# Patient Record
Sex: Female | Born: 1945
Health system: Southern US, Community
[De-identification: ages and names within clinical notes are randomized; demographics above are authoritative.]

## PROBLEM LIST (undated history)

## (undated) DIAGNOSIS — G8929 Other chronic pain: Secondary | ICD-10-CM

## (undated) DIAGNOSIS — M542 Cervicalgia: Secondary | ICD-10-CM

## (undated) DIAGNOSIS — J4520 Mild intermittent asthma, uncomplicated: Secondary | ICD-10-CM

## (undated) DIAGNOSIS — Z973 Presence of spectacles and contact lenses: Secondary | ICD-10-CM

## (undated) DIAGNOSIS — M5412 Radiculopathy, cervical region: Secondary | ICD-10-CM

## (undated) DIAGNOSIS — M5416 Radiculopathy, lumbar region: Secondary | ICD-10-CM

## (undated) DIAGNOSIS — Z9889 Other specified postprocedural states: Secondary | ICD-10-CM

## (undated) DIAGNOSIS — I10 Essential (primary) hypertension: Secondary | ICD-10-CM

## (undated) DIAGNOSIS — M549 Dorsalgia, unspecified: Secondary | ICD-10-CM

## (undated) DIAGNOSIS — Z8639 Personal history of other endocrine, nutritional and metabolic disease: Secondary | ICD-10-CM

## (undated) DIAGNOSIS — K219 Gastro-esophageal reflux disease without esophagitis: Secondary | ICD-10-CM

## (undated) DIAGNOSIS — E785 Hyperlipidemia, unspecified: Secondary | ICD-10-CM

## (undated) DIAGNOSIS — Z8673 Personal history of transient ischemic attack (TIA), and cerebral infarction without residual deficits: Secondary | ICD-10-CM

## (undated) DIAGNOSIS — G43909 Migraine, unspecified, not intractable, without status migrainosus: Secondary | ICD-10-CM

## (undated) DIAGNOSIS — F329 Major depressive disorder, single episode, unspecified: Secondary | ICD-10-CM

## (undated) DIAGNOSIS — E890 Postprocedural hypothyroidism: Secondary | ICD-10-CM

## (undated) DIAGNOSIS — Z86018 Personal history of other benign neoplasm: Secondary | ICD-10-CM

## (undated) DIAGNOSIS — R112 Nausea with vomiting, unspecified: Secondary | ICD-10-CM

## (undated) DIAGNOSIS — M797 Fibromyalgia: Secondary | ICD-10-CM

## (undated) DIAGNOSIS — N3941 Urge incontinence: Secondary | ICD-10-CM

## (undated) DIAGNOSIS — Z8781 Personal history of (healed) traumatic fracture: Secondary | ICD-10-CM

## (undated) DIAGNOSIS — Z8719 Personal history of other diseases of the digestive system: Secondary | ICD-10-CM

## (undated) DIAGNOSIS — F419 Anxiety disorder, unspecified: Secondary | ICD-10-CM

## (undated) DIAGNOSIS — F32A Depression, unspecified: Secondary | ICD-10-CM

## (undated) DIAGNOSIS — M75101 Unspecified rotator cuff tear or rupture of right shoulder, not specified as traumatic: Secondary | ICD-10-CM

## (undated) HISTORY — DX: Depression, unspecified: F32.A

## (undated) HISTORY — DX: Personal history of (healed) traumatic fracture: Z87.81

## (undated) HISTORY — DX: Major depressive disorder, single episode, unspecified: F32.9

## (undated) HISTORY — DX: Essential (primary) hypertension: I10

## (undated) HISTORY — DX: Fibromyalgia: M79.7

## (undated) HISTORY — PX: OTHER SURGICAL HISTORY: SHX169

## (undated) HISTORY — PX: CARDIAC CATHETERIZATION: SHX172

## (undated) HISTORY — DX: Migraine, unspecified, not intractable, without status migrainosus: G43.909

## (undated) HISTORY — DX: Gastro-esophageal reflux disease without esophagitis: K21.9

## (undated) HISTORY — DX: Anxiety disorder, unspecified: F41.9

---

## 1982-07-11 HISTORY — PX: VAGINAL HYSTERECTOMY: SUR661

## 1983-07-12 HISTORY — PX: TOTAL THYROIDECTOMY: SHX2547

## 1989-03-11 HISTORY — PX: EXCISION OF BREAST BIOPSY: SHX5822

## 1998-04-25 ENCOUNTER — Encounter: Payer: Self-pay | Admitting: Emergency Medicine

## 1998-04-25 ENCOUNTER — Emergency Department (HOSPITAL_COMMUNITY): Admission: EM | Admit: 1998-04-25 | Discharge: 1998-04-25 | Payer: Self-pay | Admitting: Emergency Medicine

## 1998-05-04 ENCOUNTER — Emergency Department (HOSPITAL_COMMUNITY): Admission: EM | Admit: 1998-05-04 | Discharge: 1998-05-04 | Payer: Self-pay | Admitting: Emergency Medicine

## 1998-06-21 ENCOUNTER — Ambulatory Visit (HOSPITAL_COMMUNITY): Admission: RE | Admit: 1998-06-21 | Discharge: 1998-06-21 | Payer: Self-pay | Admitting: Orthopedic Surgery

## 1998-06-21 ENCOUNTER — Encounter: Payer: Self-pay | Admitting: Orthopedic Surgery

## 1998-07-11 HISTORY — PX: WRIST SURGERY: SHX841

## 1998-07-11 HISTORY — PX: LAPAROSCOPIC CHOLECYSTECTOMY: SUR755

## 1998-12-11 ENCOUNTER — Encounter: Payer: Self-pay | Admitting: Specialist

## 1998-12-11 ENCOUNTER — Ambulatory Visit (HOSPITAL_COMMUNITY): Admission: RE | Admit: 1998-12-11 | Discharge: 1998-12-11 | Payer: Self-pay | Admitting: Specialist

## 1999-05-28 ENCOUNTER — Encounter: Payer: Self-pay | Admitting: General Surgery

## 1999-05-31 ENCOUNTER — Encounter (INDEPENDENT_AMBULATORY_CARE_PROVIDER_SITE_OTHER): Payer: Self-pay | Admitting: *Deleted

## 1999-05-31 ENCOUNTER — Ambulatory Visit (HOSPITAL_COMMUNITY): Admission: RE | Admit: 1999-05-31 | Discharge: 1999-06-01 | Payer: Self-pay | Admitting: General Surgery

## 1999-09-20 ENCOUNTER — Encounter: Admission: RE | Admit: 1999-09-20 | Discharge: 1999-10-20 | Payer: Self-pay | Admitting: Orthopedic Surgery

## 1999-10-05 ENCOUNTER — Ambulatory Visit (HOSPITAL_COMMUNITY): Admission: RE | Admit: 1999-10-05 | Discharge: 1999-10-05 | Payer: Self-pay | Admitting: Orthopedic Surgery

## 1999-10-05 ENCOUNTER — Encounter: Payer: Self-pay | Admitting: Orthopedic Surgery

## 1999-10-20 ENCOUNTER — Encounter: Admission: RE | Admit: 1999-10-20 | Discharge: 1999-11-10 | Payer: Self-pay | Admitting: Orthopedic Surgery

## 2000-01-27 ENCOUNTER — Ambulatory Visit (HOSPITAL_COMMUNITY): Admission: RE | Admit: 2000-01-27 | Discharge: 2000-01-27 | Payer: Self-pay | Admitting: *Deleted

## 2000-01-27 ENCOUNTER — Encounter: Payer: Self-pay | Admitting: *Deleted

## 2000-07-11 HISTORY — PX: EXCISION MORTON'S NEUROMA: SHX5013

## 2001-03-20 ENCOUNTER — Encounter: Payer: Self-pay | Admitting: *Deleted

## 2001-03-20 ENCOUNTER — Ambulatory Visit (HOSPITAL_COMMUNITY): Admission: RE | Admit: 2001-03-20 | Discharge: 2001-03-20 | Payer: Self-pay | Admitting: *Deleted

## 2001-11-12 ENCOUNTER — Other Ambulatory Visit: Admission: RE | Admit: 2001-11-12 | Discharge: 2001-11-12 | Payer: Self-pay | Admitting: Dermatology

## 2001-11-15 ENCOUNTER — Emergency Department (HOSPITAL_COMMUNITY): Admission: EM | Admit: 2001-11-15 | Discharge: 2001-11-15 | Payer: Self-pay

## 2001-11-17 ENCOUNTER — Emergency Department (HOSPITAL_COMMUNITY): Admission: EM | Admit: 2001-11-17 | Discharge: 2001-11-18 | Payer: Self-pay | Admitting: Emergency Medicine

## 2002-06-22 ENCOUNTER — Ambulatory Visit (HOSPITAL_COMMUNITY): Admission: RE | Admit: 2002-06-22 | Discharge: 2002-06-22 | Payer: Self-pay | Admitting: Orthopedic Surgery

## 2002-06-23 ENCOUNTER — Encounter: Payer: Self-pay | Admitting: Orthopedic Surgery

## 2003-01-17 ENCOUNTER — Ambulatory Visit (HOSPITAL_COMMUNITY): Admission: RE | Admit: 2003-01-17 | Discharge: 2003-01-17 | Payer: Self-pay | Admitting: Cardiology

## 2003-02-24 ENCOUNTER — Ambulatory Visit (HOSPITAL_BASED_OUTPATIENT_CLINIC_OR_DEPARTMENT_OTHER): Admission: RE | Admit: 2003-02-24 | Discharge: 2003-02-24 | Payer: Self-pay | Admitting: Internal Medicine

## 2003-03-21 ENCOUNTER — Encounter: Admission: RE | Admit: 2003-03-21 | Discharge: 2003-03-21 | Payer: Self-pay | Admitting: Internal Medicine

## 2003-03-25 ENCOUNTER — Ambulatory Visit (HOSPITAL_COMMUNITY): Admission: RE | Admit: 2003-03-25 | Discharge: 2003-03-25 | Payer: Self-pay | Admitting: Internal Medicine

## 2003-04-07 ENCOUNTER — Ambulatory Visit (HOSPITAL_COMMUNITY): Admission: RE | Admit: 2003-04-07 | Discharge: 2003-04-07 | Payer: Self-pay | Admitting: Internal Medicine

## 2003-04-07 ENCOUNTER — Encounter: Admission: RE | Admit: 2003-04-07 | Discharge: 2003-04-07 | Payer: Self-pay | Admitting: Internal Medicine

## 2003-05-15 ENCOUNTER — Encounter: Admission: RE | Admit: 2003-05-15 | Discharge: 2003-05-15 | Payer: Self-pay | Admitting: Internal Medicine

## 2003-05-19 ENCOUNTER — Encounter
Admission: RE | Admit: 2003-05-19 | Discharge: 2003-08-17 | Payer: Self-pay | Admitting: Physical Medicine & Rehabilitation

## 2003-05-20 ENCOUNTER — Encounter: Admission: RE | Admit: 2003-05-20 | Discharge: 2003-05-20 | Payer: Self-pay | Admitting: Internal Medicine

## 2003-05-27 ENCOUNTER — Encounter
Admission: RE | Admit: 2003-05-27 | Discharge: 2003-08-18 | Payer: Self-pay | Admitting: Physical Medicine & Rehabilitation

## 2003-08-13 ENCOUNTER — Ambulatory Visit (HOSPITAL_COMMUNITY): Admission: RE | Admit: 2003-08-13 | Discharge: 2003-08-13 | Payer: Self-pay | Admitting: Internal Medicine

## 2003-08-13 ENCOUNTER — Encounter: Admission: RE | Admit: 2003-08-13 | Discharge: 2003-08-13 | Payer: Self-pay | Admitting: Internal Medicine

## 2003-10-01 ENCOUNTER — Encounter
Admission: RE | Admit: 2003-10-01 | Discharge: 2003-12-30 | Payer: Self-pay | Admitting: Physical Medicine & Rehabilitation

## 2003-10-06 ENCOUNTER — Ambulatory Visit (HOSPITAL_COMMUNITY): Admission: RE | Admit: 2003-10-06 | Discharge: 2003-10-06 | Payer: Self-pay | Admitting: Internal Medicine

## 2003-10-13 ENCOUNTER — Encounter: Admission: RE | Admit: 2003-10-13 | Discharge: 2003-10-13 | Payer: Self-pay | Admitting: Internal Medicine

## 2003-12-24 ENCOUNTER — Encounter: Admission: RE | Admit: 2003-12-24 | Discharge: 2003-12-24 | Payer: Self-pay | Admitting: Internal Medicine

## 2003-12-24 ENCOUNTER — Encounter
Admission: RE | Admit: 2003-12-24 | Discharge: 2004-02-04 | Payer: Self-pay | Admitting: Physical Medicine & Rehabilitation

## 2004-01-30 ENCOUNTER — Encounter
Admission: RE | Admit: 2004-01-30 | Discharge: 2004-04-05 | Payer: Self-pay | Admitting: Physical Medicine & Rehabilitation

## 2004-02-04 ENCOUNTER — Ambulatory Visit (HOSPITAL_COMMUNITY)
Admission: RE | Admit: 2004-02-04 | Discharge: 2004-02-04 | Payer: Self-pay | Admitting: Physical Medicine & Rehabilitation

## 2004-02-19 ENCOUNTER — Emergency Department (HOSPITAL_COMMUNITY): Admission: EM | Admit: 2004-02-19 | Discharge: 2004-02-19 | Payer: Self-pay | Admitting: Emergency Medicine

## 2004-03-15 ENCOUNTER — Emergency Department (HOSPITAL_COMMUNITY): Admission: EM | Admit: 2004-03-15 | Discharge: 2004-03-15 | Payer: Self-pay | Admitting: Emergency Medicine

## 2004-03-19 ENCOUNTER — Ambulatory Visit: Payer: Self-pay | Admitting: Internal Medicine

## 2004-03-31 ENCOUNTER — Ambulatory Visit: Payer: Self-pay | Admitting: Internal Medicine

## 2004-04-05 ENCOUNTER — Encounter
Admission: RE | Admit: 2004-04-05 | Discharge: 2004-07-04 | Payer: Self-pay | Admitting: Physical Medicine & Rehabilitation

## 2004-04-06 ENCOUNTER — Ambulatory Visit: Payer: Self-pay | Admitting: Physical Medicine & Rehabilitation

## 2004-04-10 ENCOUNTER — Ambulatory Visit (HOSPITAL_COMMUNITY)
Admission: RE | Admit: 2004-04-10 | Discharge: 2004-04-10 | Payer: Self-pay | Admitting: Physical Medicine & Rehabilitation

## 2004-04-16 ENCOUNTER — Encounter
Admission: RE | Admit: 2004-04-16 | Discharge: 2004-07-15 | Payer: Self-pay | Admitting: Physical Medicine & Rehabilitation

## 2004-04-16 ENCOUNTER — Ambulatory Visit: Payer: Self-pay | Admitting: Psychology

## 2004-05-22 ENCOUNTER — Emergency Department (HOSPITAL_COMMUNITY): Admission: EM | Admit: 2004-05-22 | Discharge: 2004-05-22 | Payer: Self-pay | Admitting: Emergency Medicine

## 2004-06-15 ENCOUNTER — Ambulatory Visit: Payer: Self-pay | Admitting: Physical Medicine & Rehabilitation

## 2004-06-30 ENCOUNTER — Ambulatory Visit: Payer: Self-pay | Admitting: Internal Medicine

## 2004-07-29 ENCOUNTER — Ambulatory Visit: Payer: Self-pay | Admitting: Psychology

## 2004-07-29 ENCOUNTER — Encounter
Admission: RE | Admit: 2004-07-29 | Discharge: 2004-10-27 | Payer: Self-pay | Admitting: Physical Medicine & Rehabilitation

## 2004-07-30 ENCOUNTER — Ambulatory Visit (HOSPITAL_COMMUNITY): Admission: RE | Admit: 2004-07-30 | Discharge: 2004-07-30 | Payer: Self-pay | Admitting: Internal Medicine

## 2004-07-30 ENCOUNTER — Ambulatory Visit: Payer: Self-pay | Admitting: Internal Medicine

## 2004-08-10 ENCOUNTER — Ambulatory Visit (HOSPITAL_COMMUNITY): Admission: RE | Admit: 2004-08-10 | Discharge: 2004-08-10 | Payer: Self-pay | Admitting: Internal Medicine

## 2004-09-09 ENCOUNTER — Encounter
Admission: RE | Admit: 2004-09-09 | Discharge: 2004-12-08 | Payer: Self-pay | Admitting: Physical Medicine & Rehabilitation

## 2004-09-13 ENCOUNTER — Ambulatory Visit: Payer: Self-pay | Admitting: Physical Medicine & Rehabilitation

## 2004-09-18 ENCOUNTER — Emergency Department (HOSPITAL_COMMUNITY): Admission: EM | Admit: 2004-09-18 | Discharge: 2004-09-18 | Payer: Self-pay | Admitting: Emergency Medicine

## 2004-09-22 ENCOUNTER — Ambulatory Visit: Payer: Self-pay | Admitting: Internal Medicine

## 2004-10-06 ENCOUNTER — Ambulatory Visit: Payer: Self-pay | Admitting: Psychology

## 2004-10-06 ENCOUNTER — Ambulatory Visit: Payer: Self-pay | Admitting: Internal Medicine

## 2004-10-15 ENCOUNTER — Ambulatory Visit (HOSPITAL_COMMUNITY): Admission: RE | Admit: 2004-10-15 | Discharge: 2004-10-15 | Payer: Self-pay | Admitting: Internal Medicine

## 2004-10-15 ENCOUNTER — Ambulatory Visit: Payer: Self-pay | Admitting: Internal Medicine

## 2004-11-10 ENCOUNTER — Ambulatory Visit: Payer: Self-pay | Admitting: Internal Medicine

## 2004-12-01 ENCOUNTER — Ambulatory Visit: Payer: Self-pay | Admitting: Physical Medicine & Rehabilitation

## 2004-12-01 ENCOUNTER — Ambulatory Visit (HOSPITAL_COMMUNITY)
Admission: RE | Admit: 2004-12-01 | Discharge: 2004-12-01 | Payer: Self-pay | Admitting: Physical Medicine & Rehabilitation

## 2004-12-02 ENCOUNTER — Ambulatory Visit: Payer: Self-pay | Admitting: Psychology

## 2004-12-02 ENCOUNTER — Encounter: Admission: RE | Admit: 2004-12-02 | Discharge: 2005-03-02 | Payer: Self-pay | Admitting: Psychology

## 2004-12-08 ENCOUNTER — Ambulatory Visit: Payer: Self-pay | Admitting: Internal Medicine

## 2004-12-15 ENCOUNTER — Ambulatory Visit: Payer: Self-pay | Admitting: Internal Medicine

## 2004-12-30 ENCOUNTER — Ambulatory Visit: Payer: Self-pay | Admitting: Psychology

## 2005-01-05 ENCOUNTER — Emergency Department (HOSPITAL_COMMUNITY): Admission: EM | Admit: 2005-01-05 | Discharge: 2005-01-06 | Payer: Self-pay | Admitting: Emergency Medicine

## 2005-01-18 ENCOUNTER — Ambulatory Visit: Payer: Self-pay | Admitting: Physical Medicine & Rehabilitation

## 2005-01-18 ENCOUNTER — Encounter
Admission: RE | Admit: 2005-01-18 | Discharge: 2005-04-18 | Payer: Self-pay | Admitting: Physical Medicine & Rehabilitation

## 2005-01-26 ENCOUNTER — Ambulatory Visit: Payer: Self-pay | Admitting: Internal Medicine

## 2005-01-27 ENCOUNTER — Encounter
Admission: RE | Admit: 2005-01-27 | Discharge: 2005-02-10 | Payer: Self-pay | Admitting: Physical Medicine & Rehabilitation

## 2005-01-27 ENCOUNTER — Ambulatory Visit (HOSPITAL_COMMUNITY): Admission: RE | Admit: 2005-01-27 | Discharge: 2005-01-27 | Payer: Self-pay | Admitting: Internal Medicine

## 2005-01-29 ENCOUNTER — Emergency Department (HOSPITAL_COMMUNITY): Admission: EM | Admit: 2005-01-29 | Discharge: 2005-01-29 | Payer: Self-pay | Admitting: Emergency Medicine

## 2005-02-04 ENCOUNTER — Ambulatory Visit: Payer: Self-pay | Admitting: Internal Medicine

## 2005-02-09 ENCOUNTER — Ambulatory Visit: Payer: Self-pay | Admitting: Internal Medicine

## 2005-02-14 ENCOUNTER — Ambulatory Visit (HOSPITAL_COMMUNITY): Admission: RE | Admit: 2005-02-14 | Discharge: 2005-02-14 | Payer: Self-pay | Admitting: Internal Medicine

## 2005-02-14 ENCOUNTER — Ambulatory Visit: Payer: Self-pay | Admitting: Internal Medicine

## 2005-02-14 DIAGNOSIS — K449 Diaphragmatic hernia without obstruction or gangrene: Secondary | ICD-10-CM | POA: Insufficient documentation

## 2005-02-17 ENCOUNTER — Ambulatory Visit (HOSPITAL_COMMUNITY): Admission: RE | Admit: 2005-02-17 | Discharge: 2005-02-17 | Payer: Self-pay | Admitting: Internal Medicine

## 2005-02-17 ENCOUNTER — Encounter: Payer: Self-pay | Admitting: Internal Medicine

## 2005-02-21 ENCOUNTER — Ambulatory Visit: Payer: Self-pay | Admitting: Physical Medicine & Rehabilitation

## 2005-02-21 ENCOUNTER — Ambulatory Visit: Payer: Self-pay | Admitting: Internal Medicine

## 2005-04-07 ENCOUNTER — Encounter
Admission: RE | Admit: 2005-04-07 | Discharge: 2005-05-02 | Payer: Self-pay | Admitting: Physical Medicine & Rehabilitation

## 2005-04-07 ENCOUNTER — Ambulatory Visit: Payer: Self-pay | Admitting: Psychology

## 2005-04-18 ENCOUNTER — Ambulatory Visit: Payer: Self-pay | Admitting: Physical Medicine & Rehabilitation

## 2005-04-18 ENCOUNTER — Encounter
Admission: RE | Admit: 2005-04-18 | Discharge: 2005-07-17 | Payer: Self-pay | Admitting: Physical Medicine & Rehabilitation

## 2005-04-20 ENCOUNTER — Ambulatory Visit: Payer: Self-pay | Admitting: Internal Medicine

## 2005-05-03 ENCOUNTER — Encounter
Admission: RE | Admit: 2005-05-03 | Discharge: 2005-05-30 | Payer: Self-pay | Admitting: Physical Medicine & Rehabilitation

## 2005-05-31 ENCOUNTER — Encounter
Admission: RE | Admit: 2005-05-31 | Discharge: 2005-08-29 | Payer: Self-pay | Admitting: Physical Medicine & Rehabilitation

## 2005-06-22 ENCOUNTER — Ambulatory Visit: Payer: Self-pay | Admitting: Internal Medicine

## 2005-07-13 ENCOUNTER — Emergency Department (HOSPITAL_COMMUNITY): Admission: EM | Admit: 2005-07-13 | Discharge: 2005-07-13 | Payer: Self-pay | Admitting: Emergency Medicine

## 2005-07-21 ENCOUNTER — Ambulatory Visit: Payer: Self-pay | Admitting: Physical Medicine & Rehabilitation

## 2005-07-21 ENCOUNTER — Encounter
Admission: RE | Admit: 2005-07-21 | Discharge: 2005-10-19 | Payer: Self-pay | Admitting: Physical Medicine & Rehabilitation

## 2005-08-08 ENCOUNTER — Encounter (HOSPITAL_COMMUNITY): Admission: RE | Admit: 2005-08-08 | Discharge: 2005-09-07 | Payer: Self-pay | Admitting: Orthopaedic Surgery

## 2005-08-12 ENCOUNTER — Ambulatory Visit (HOSPITAL_COMMUNITY): Admission: RE | Admit: 2005-08-12 | Discharge: 2005-08-12 | Payer: Self-pay | Admitting: Internal Medicine

## 2005-08-23 ENCOUNTER — Ambulatory Visit: Payer: Self-pay | Admitting: Internal Medicine

## 2005-08-30 ENCOUNTER — Ambulatory Visit: Payer: Self-pay | Admitting: Internal Medicine

## 2005-09-07 ENCOUNTER — Ambulatory Visit: Payer: Self-pay | Admitting: Internal Medicine

## 2005-09-08 ENCOUNTER — Ambulatory Visit (HOSPITAL_COMMUNITY): Admission: RE | Admit: 2005-09-08 | Discharge: 2005-09-08 | Payer: Self-pay | Admitting: Internal Medicine

## 2005-09-13 ENCOUNTER — Ambulatory Visit (HOSPITAL_COMMUNITY): Admission: RE | Admit: 2005-09-13 | Discharge: 2005-09-13 | Payer: Self-pay | Admitting: Internal Medicine

## 2005-09-15 ENCOUNTER — Ambulatory Visit: Payer: Self-pay | Admitting: Physical Medicine & Rehabilitation

## 2005-10-12 ENCOUNTER — Ambulatory Visit: Payer: Self-pay | Admitting: Internal Medicine

## 2005-11-01 ENCOUNTER — Ambulatory Visit: Payer: Self-pay | Admitting: Internal Medicine

## 2005-11-02 ENCOUNTER — Encounter
Admission: RE | Admit: 2005-11-02 | Discharge: 2006-01-31 | Payer: Self-pay | Admitting: Physical Medicine & Rehabilitation

## 2005-11-08 ENCOUNTER — Ambulatory Visit: Payer: Self-pay | Admitting: Internal Medicine

## 2005-12-08 ENCOUNTER — Ambulatory Visit: Payer: Self-pay | Admitting: Physical Medicine & Rehabilitation

## 2005-12-14 ENCOUNTER — Ambulatory Visit: Payer: Self-pay | Admitting: Internal Medicine

## 2005-12-28 ENCOUNTER — Ambulatory Visit: Payer: Self-pay | Admitting: Internal Medicine

## 2005-12-29 ENCOUNTER — Encounter: Admission: RE | Admit: 2005-12-29 | Discharge: 2006-03-29 | Payer: Self-pay | Admitting: Psychology

## 2005-12-29 ENCOUNTER — Ambulatory Visit: Payer: Self-pay | Admitting: Psychology

## 2006-01-10 ENCOUNTER — Ambulatory Visit: Payer: Self-pay | Admitting: Hospitalist

## 2006-01-10 ENCOUNTER — Encounter: Payer: Self-pay | Admitting: Vascular Surgery

## 2006-01-10 ENCOUNTER — Ambulatory Visit (HOSPITAL_COMMUNITY): Admission: RE | Admit: 2006-01-10 | Discharge: 2006-01-10 | Payer: Self-pay | Admitting: Hospitalist

## 2006-01-17 ENCOUNTER — Ambulatory Visit: Payer: Self-pay | Admitting: Hospitalist

## 2006-01-26 ENCOUNTER — Emergency Department (HOSPITAL_COMMUNITY): Admission: EM | Admit: 2006-01-26 | Discharge: 2006-01-26 | Payer: Self-pay | Admitting: Emergency Medicine

## 2006-02-16 ENCOUNTER — Ambulatory Visit (HOSPITAL_COMMUNITY): Admission: RE | Admit: 2006-02-16 | Discharge: 2006-02-16 | Payer: Self-pay | Admitting: Orthopaedic Surgery

## 2006-02-23 ENCOUNTER — Ambulatory Visit: Payer: Self-pay | Admitting: Physical Medicine & Rehabilitation

## 2006-02-23 ENCOUNTER — Ambulatory Visit: Payer: Self-pay | Admitting: Internal Medicine

## 2006-02-23 ENCOUNTER — Encounter
Admission: RE | Admit: 2006-02-23 | Discharge: 2006-05-24 | Payer: Self-pay | Admitting: Physical Medicine & Rehabilitation

## 2006-02-24 ENCOUNTER — Ambulatory Visit: Payer: Self-pay | Admitting: Psychology

## 2006-03-06 ENCOUNTER — Ambulatory Visit: Payer: Self-pay | Admitting: Cardiology

## 2006-03-08 ENCOUNTER — Ambulatory Visit: Payer: Self-pay | Admitting: Internal Medicine

## 2006-03-09 ENCOUNTER — Encounter: Payer: Self-pay | Admitting: Internal Medicine

## 2006-03-28 ENCOUNTER — Ambulatory Visit: Payer: Self-pay

## 2006-04-10 ENCOUNTER — Encounter: Payer: Self-pay | Admitting: Internal Medicine

## 2006-04-10 ENCOUNTER — Ambulatory Visit: Payer: Self-pay | Admitting: Cardiology

## 2006-04-11 DIAGNOSIS — K219 Gastro-esophageal reflux disease without esophagitis: Secondary | ICD-10-CM | POA: Insufficient documentation

## 2006-04-11 DIAGNOSIS — E039 Hypothyroidism, unspecified: Secondary | ICD-10-CM | POA: Insufficient documentation

## 2006-04-11 DIAGNOSIS — I10 Essential (primary) hypertension: Secondary | ICD-10-CM | POA: Insufficient documentation

## 2006-04-20 ENCOUNTER — Ambulatory Visit: Payer: Self-pay | Admitting: Internal Medicine

## 2006-04-28 ENCOUNTER — Ambulatory Visit: Payer: Self-pay | Admitting: Psychology

## 2006-04-28 ENCOUNTER — Encounter: Admission: RE | Admit: 2006-04-28 | Discharge: 2006-07-27 | Payer: Self-pay | Admitting: Psychology

## 2006-05-25 ENCOUNTER — Ambulatory Visit: Payer: Self-pay | Admitting: Physical Medicine & Rehabilitation

## 2006-05-25 ENCOUNTER — Encounter
Admission: RE | Admit: 2006-05-25 | Discharge: 2006-08-23 | Payer: Self-pay | Admitting: Physical Medicine & Rehabilitation

## 2006-06-06 ENCOUNTER — Encounter (INDEPENDENT_AMBULATORY_CARE_PROVIDER_SITE_OTHER): Payer: Self-pay | Admitting: *Deleted

## 2006-06-06 ENCOUNTER — Ambulatory Visit: Payer: Self-pay | Admitting: Internal Medicine

## 2006-06-06 ENCOUNTER — Ambulatory Visit (HOSPITAL_COMMUNITY): Admission: RE | Admit: 2006-06-06 | Discharge: 2006-06-06 | Payer: Self-pay | Admitting: Internal Medicine

## 2006-06-06 LAB — CONVERTED CEMR LAB
ALT: 42 units/L — ABNORMAL HIGH (ref 0–35)
Alkaline Phosphatase: 95 units/L (ref 39–117)
BUN: 5 mg/dL — ABNORMAL LOW (ref 6–23)
CO2: 26 meq/L (ref 19–32)
Chloride: 103 meq/L (ref 96–112)
Glucose, Bld: 92 mg/dL (ref 70–99)
Hemoglobin: 13.7 g/dL (ref 11.7–14.8)
Ketones, ur: NEGATIVE mg/dL
MCHC: 33.9 g/dL (ref 33.1–35.4)
Protein, ur: NEGATIVE mg/dL
Specific Gravity, Urine: 1.01 (ref 1.005–1.03)
Urine Glucose: NEGATIVE mg/dL
pH: 6.5 (ref 5.0–8.0)

## 2006-06-07 ENCOUNTER — Encounter (INDEPENDENT_AMBULATORY_CARE_PROVIDER_SITE_OTHER): Payer: Self-pay | Admitting: *Deleted

## 2006-06-08 ENCOUNTER — Ambulatory Visit: Payer: Self-pay | Admitting: Internal Medicine

## 2006-06-08 ENCOUNTER — Encounter (INDEPENDENT_AMBULATORY_CARE_PROVIDER_SITE_OTHER): Payer: Self-pay | Admitting: *Deleted

## 2006-06-08 LAB — CONVERTED CEMR LAB
CO2: 27 meq/L (ref 19–32)
Glucose, Bld: 97 mg/dL (ref 70–99)
Sodium: 138 meq/L (ref 135–145)

## 2006-06-21 ENCOUNTER — Ambulatory Visit: Payer: Self-pay | Admitting: Internal Medicine

## 2006-06-21 LAB — CONVERTED CEMR LAB
ALT: 44 units/L — ABNORMAL HIGH (ref 0–35)
Calcium: 9.2 mg/dL (ref 8.4–10.5)
Creatinine, Ser: 0.68 mg/dL (ref 0.40–1.20)
Potassium: 4.3 meq/L (ref 3.5–5.3)
Sodium: 141 meq/L (ref 135–145)
Total Bilirubin: 0.3 mg/dL (ref 0.3–1.2)

## 2006-09-04 ENCOUNTER — Telehealth: Payer: Self-pay | Admitting: *Deleted

## 2006-09-20 ENCOUNTER — Telehealth (INDEPENDENT_AMBULATORY_CARE_PROVIDER_SITE_OTHER): Payer: Self-pay | Admitting: Internal Medicine

## 2006-09-21 ENCOUNTER — Telehealth: Payer: Self-pay | Admitting: *Deleted

## 2006-09-27 ENCOUNTER — Encounter
Admission: RE | Admit: 2006-09-27 | Discharge: 2006-09-27 | Payer: Self-pay | Admitting: Physical Medicine and Rehabilitation

## 2006-10-25 ENCOUNTER — Ambulatory Visit: Payer: Self-pay | Admitting: Internal Medicine

## 2006-10-25 DIAGNOSIS — G43009 Migraine without aura, not intractable, without status migrainosus: Secondary | ICD-10-CM | POA: Insufficient documentation

## 2006-10-25 DIAGNOSIS — M797 Fibromyalgia: Secondary | ICD-10-CM | POA: Insufficient documentation

## 2006-10-25 DIAGNOSIS — J309 Allergic rhinitis, unspecified: Secondary | ICD-10-CM | POA: Insufficient documentation

## 2006-10-25 DIAGNOSIS — M546 Pain in thoracic spine: Secondary | ICD-10-CM | POA: Insufficient documentation

## 2006-10-25 DIAGNOSIS — M25519 Pain in unspecified shoulder: Secondary | ICD-10-CM | POA: Insufficient documentation

## 2006-10-25 LAB — CONVERTED CEMR LAB
Alkaline Phosphatase: 139 units/L — ABNORMAL HIGH (ref 39–117)
BUN: 10 mg/dL (ref 6–23)
CO2: 24 meq/L (ref 19–32)
Calcium: 9.4 mg/dL (ref 8.4–10.5)
Chloride: 103 meq/L (ref 96–112)
Creatinine, Ser: 0.69 mg/dL (ref 0.40–1.20)
Sodium: 140 meq/L (ref 135–145)
TSH: 0.449 microintl units/mL (ref 0.350–5.50)

## 2006-11-07 ENCOUNTER — Ambulatory Visit (HOSPITAL_COMMUNITY): Admission: RE | Admit: 2006-11-07 | Discharge: 2006-11-07 | Payer: Self-pay | Admitting: Internal Medicine

## 2006-11-08 ENCOUNTER — Encounter
Admission: RE | Admit: 2006-11-08 | Discharge: 2006-11-08 | Payer: Self-pay | Admitting: Physical Medicine and Rehabilitation

## 2006-11-10 ENCOUNTER — Telehealth (INDEPENDENT_AMBULATORY_CARE_PROVIDER_SITE_OTHER): Payer: Self-pay | Admitting: *Deleted

## 2006-11-16 ENCOUNTER — Encounter
Admission: RE | Admit: 2006-11-16 | Discharge: 2007-02-14 | Payer: Self-pay | Admitting: Physical Medicine & Rehabilitation

## 2006-11-16 ENCOUNTER — Ambulatory Visit: Payer: Self-pay | Admitting: Physical Medicine & Rehabilitation

## 2006-12-08 ENCOUNTER — Encounter
Admission: RE | Admit: 2006-12-08 | Discharge: 2007-03-08 | Payer: Self-pay | Admitting: Physical Medicine & Rehabilitation

## 2006-12-11 ENCOUNTER — Ambulatory Visit: Payer: Self-pay | Admitting: Physical Medicine & Rehabilitation

## 2006-12-20 ENCOUNTER — Telehealth (INDEPENDENT_AMBULATORY_CARE_PROVIDER_SITE_OTHER): Payer: Self-pay | Admitting: Pharmacy Technician

## 2006-12-27 ENCOUNTER — Ambulatory Visit: Payer: Self-pay | Admitting: Physical Medicine & Rehabilitation

## 2007-01-01 ENCOUNTER — Telehealth (INDEPENDENT_AMBULATORY_CARE_PROVIDER_SITE_OTHER): Payer: Self-pay | Admitting: Pharmacy Technician

## 2007-01-17 ENCOUNTER — Ambulatory Visit: Payer: Self-pay | Admitting: Internal Medicine

## 2007-01-17 DIAGNOSIS — R748 Abnormal levels of other serum enzymes: Secondary | ICD-10-CM | POA: Insufficient documentation

## 2007-01-23 ENCOUNTER — Ambulatory Visit (HOSPITAL_COMMUNITY): Admission: RE | Admit: 2007-01-23 | Discharge: 2007-01-23 | Payer: Self-pay | Admitting: Orthopaedic Surgery

## 2007-02-06 ENCOUNTER — Ambulatory Visit: Payer: Self-pay | Admitting: Physical Medicine & Rehabilitation

## 2007-02-14 ENCOUNTER — Encounter
Admission: RE | Admit: 2007-02-14 | Discharge: 2007-03-29 | Payer: Self-pay | Admitting: Physical Medicine & Rehabilitation

## 2007-02-21 ENCOUNTER — Telehealth: Payer: Self-pay | Admitting: *Deleted

## 2007-02-26 ENCOUNTER — Ambulatory Visit: Payer: Self-pay | Admitting: Physical Medicine & Rehabilitation

## 2007-03-19 ENCOUNTER — Ambulatory Visit: Payer: Self-pay | Admitting: Cardiology

## 2007-03-20 ENCOUNTER — Telehealth (INDEPENDENT_AMBULATORY_CARE_PROVIDER_SITE_OTHER): Payer: Self-pay | Admitting: Pharmacy Technician

## 2007-04-12 ENCOUNTER — Telehealth (INDEPENDENT_AMBULATORY_CARE_PROVIDER_SITE_OTHER): Payer: Self-pay | Admitting: Pharmacy Technician

## 2007-04-19 ENCOUNTER — Ambulatory Visit: Payer: Self-pay | Admitting: Internal Medicine

## 2007-04-19 LAB — CONVERTED CEMR LAB
ALT: 32 units/L (ref 0–35)
Albumin: 4.5 g/dL (ref 3.5–5.2)
BUN: 12 mg/dL (ref 6–23)
Basophils Relative: 0 % (ref 0–1)
Bilirubin, Direct: <0.1 mg/dL (ref 0.0–0.3)
CO2: 25 meq/L (ref 19–32)
Calcium: 9.1 mg/dL (ref 8.4–10.5)
Eosinophils Absolute: 0 10*3/uL (ref 0.0–0.7)
Lymphocytes Relative: 22 % (ref 12–46)
MCHC: 31.7 g/dL (ref 30.0–36.0)
MCV: 93.9 fL (ref 78.0–100.0)
Monocytes Absolute: 0.6 10*3/uL (ref 0.2–0.7)
Neutro Abs: 5 10*3/uL (ref 1.7–7.7)
Platelets: 358 10*3/uL (ref 150–400)
Potassium: 4.5 meq/L (ref 3.5–5.3)
Sodium: 141 meq/L (ref 135–145)
Total Bilirubin: 0.2 mg/dL — ABNORMAL LOW (ref 0.3–1.2)
Total Protein: 7.1 g/dL (ref 6.0–8.3)

## 2007-04-25 ENCOUNTER — Encounter
Admission: RE | Admit: 2007-04-25 | Discharge: 2007-05-04 | Payer: Self-pay | Admitting: Physical Medicine & Rehabilitation

## 2007-04-25 ENCOUNTER — Ambulatory Visit: Payer: Self-pay | Admitting: Physical Medicine & Rehabilitation

## 2007-04-27 ENCOUNTER — Telehealth (INDEPENDENT_AMBULATORY_CARE_PROVIDER_SITE_OTHER): Payer: Self-pay | Admitting: Pharmacy Technician

## 2007-05-03 ENCOUNTER — Ambulatory Visit: Payer: Self-pay | Admitting: Physical Medicine & Rehabilitation

## 2007-05-22 ENCOUNTER — Encounter: Payer: Self-pay | Admitting: Internal Medicine

## 2007-05-22 ENCOUNTER — Ambulatory Visit: Payer: Self-pay | Admitting: Internal Medicine

## 2007-05-24 ENCOUNTER — Ambulatory Visit (HOSPITAL_COMMUNITY): Admission: RE | Admit: 2007-05-24 | Discharge: 2007-05-24 | Payer: Self-pay | Admitting: Internal Medicine

## 2007-06-10 ENCOUNTER — Ambulatory Visit (HOSPITAL_COMMUNITY): Admission: RE | Admit: 2007-06-10 | Discharge: 2007-06-10 | Payer: Self-pay | Admitting: Neurology

## 2007-06-14 ENCOUNTER — Ambulatory Visit (HOSPITAL_COMMUNITY): Admission: RE | Admit: 2007-06-14 | Discharge: 2007-06-14 | Payer: Self-pay | Admitting: Neurology

## 2007-06-25 ENCOUNTER — Telehealth: Payer: Self-pay | Admitting: Internal Medicine

## 2007-07-02 ENCOUNTER — Telehealth (INDEPENDENT_AMBULATORY_CARE_PROVIDER_SITE_OTHER): Payer: Self-pay | Admitting: Pharmacy Technician

## 2007-07-10 ENCOUNTER — Ambulatory Visit: Payer: Self-pay | Admitting: Hospitalist

## 2007-07-10 ENCOUNTER — Encounter (INDEPENDENT_AMBULATORY_CARE_PROVIDER_SITE_OTHER): Payer: Self-pay | Admitting: *Deleted

## 2007-07-10 DIAGNOSIS — R350 Frequency of micturition: Secondary | ICD-10-CM | POA: Insufficient documentation

## 2007-07-10 LAB — CONVERTED CEMR LAB
Glucose, Urine, Semiquant: NEGATIVE
Ketones, urine, test strip: NEGATIVE
Urobilinogen, UA: 0.2

## 2007-07-31 ENCOUNTER — Ambulatory Visit: Payer: Self-pay | Admitting: Physical Medicine & Rehabilitation

## 2007-07-31 ENCOUNTER — Encounter
Admission: RE | Admit: 2007-07-31 | Discharge: 2007-10-29 | Payer: Self-pay | Admitting: Physical Medicine & Rehabilitation

## 2007-08-10 ENCOUNTER — Ambulatory Visit: Payer: Self-pay | Admitting: Internal Medicine

## 2007-08-10 LAB — CONVERTED CEMR LAB: GGT: 60 units/L — ABNORMAL HIGH (ref 7–51)

## 2007-08-11 ENCOUNTER — Encounter: Payer: Self-pay | Admitting: Internal Medicine

## 2007-08-13 ENCOUNTER — Telehealth: Payer: Self-pay | Admitting: *Deleted

## 2007-09-05 ENCOUNTER — Telehealth (INDEPENDENT_AMBULATORY_CARE_PROVIDER_SITE_OTHER): Payer: Self-pay | Admitting: Pharmacy Technician

## 2007-09-14 LAB — CONVERTED CEMR LAB
ALT: 28 units/L (ref 0–35)
AST: 21 units/L (ref 0–37)
Albumin: 4.3 g/dL (ref 3.5–5.2)
Alkaline Phosphatase: 147 units/L — ABNORMAL HIGH (ref 39–117)
Bilirubin Urine: NEGATIVE
Bilirubin, Direct: <0.1 mg/dL (ref 0.0–0.3)
Ketones, ur: NEGATIVE mg/dL
Protein, ur: NEGATIVE mg/dL
Total Protein: 7.2 g/dL (ref 6.0–8.3)
Urine Glucose: NEGATIVE mg/dL

## 2007-09-18 ENCOUNTER — Telehealth (INDEPENDENT_AMBULATORY_CARE_PROVIDER_SITE_OTHER): Payer: Self-pay | Admitting: Pharmacy Technician

## 2007-09-18 ENCOUNTER — Encounter (INDEPENDENT_AMBULATORY_CARE_PROVIDER_SITE_OTHER): Payer: Self-pay | Admitting: Internal Medicine

## 2007-09-18 ENCOUNTER — Telehealth: Payer: Self-pay | Admitting: *Deleted

## 2007-09-18 ENCOUNTER — Ambulatory Visit: Payer: Self-pay | Admitting: Internal Medicine

## 2007-09-18 ENCOUNTER — Telehealth: Payer: Self-pay | Admitting: Internal Medicine

## 2007-09-18 DIAGNOSIS — R079 Chest pain, unspecified: Secondary | ICD-10-CM | POA: Insufficient documentation

## 2007-09-18 LAB — CONVERTED CEMR LAB
Glucose, Urine, Semiquant: NEGATIVE
Nitrite: NEGATIVE
Protein, U semiquant: NEGATIVE
Specific Gravity, Urine: 1.01

## 2007-09-19 ENCOUNTER — Ambulatory Visit (HOSPITAL_COMMUNITY): Admission: RE | Admit: 2007-09-19 | Discharge: 2007-09-19 | Payer: Self-pay | Admitting: Internal Medicine

## 2007-09-19 ENCOUNTER — Encounter (INDEPENDENT_AMBULATORY_CARE_PROVIDER_SITE_OTHER): Payer: Self-pay | Admitting: Internal Medicine

## 2007-09-21 ENCOUNTER — Telehealth (INDEPENDENT_AMBULATORY_CARE_PROVIDER_SITE_OTHER): Payer: Self-pay | Admitting: Pharmacy Technician

## 2007-09-25 ENCOUNTER — Ambulatory Visit (HOSPITAL_COMMUNITY): Admission: RE | Admit: 2007-09-25 | Discharge: 2007-09-25 | Payer: Self-pay | Admitting: Internal Medicine

## 2007-09-28 ENCOUNTER — Telehealth (INDEPENDENT_AMBULATORY_CARE_PROVIDER_SITE_OTHER): Payer: Self-pay | Admitting: Internal Medicine

## 2007-10-23 ENCOUNTER — Ambulatory Visit (HOSPITAL_COMMUNITY): Admission: RE | Admit: 2007-10-23 | Discharge: 2007-10-23 | Payer: Self-pay | Admitting: Orthopaedic Surgery

## 2007-10-31 ENCOUNTER — Ambulatory Visit: Payer: Self-pay | Admitting: Internal Medicine

## 2007-11-08 ENCOUNTER — Ambulatory Visit (HOSPITAL_COMMUNITY): Admission: RE | Admit: 2007-11-08 | Discharge: 2007-11-08 | Payer: Self-pay | Admitting: Internal Medicine

## 2007-11-12 ENCOUNTER — Ambulatory Visit: Payer: Self-pay | Admitting: Surgery

## 2007-11-12 ENCOUNTER — Telehealth: Payer: Self-pay | Admitting: *Deleted

## 2007-11-12 ENCOUNTER — Emergency Department (HOSPITAL_COMMUNITY): Admission: EM | Admit: 2007-11-12 | Discharge: 2007-11-12 | Payer: Self-pay | Admitting: Emergency Medicine

## 2007-11-12 ENCOUNTER — Encounter (INDEPENDENT_AMBULATORY_CARE_PROVIDER_SITE_OTHER): Payer: Self-pay | Admitting: Emergency Medicine

## 2007-11-14 ENCOUNTER — Ambulatory Visit: Payer: Self-pay | Admitting: Internal Medicine

## 2007-11-14 DIAGNOSIS — E7800 Pure hypercholesterolemia, unspecified: Secondary | ICD-10-CM | POA: Insufficient documentation

## 2007-11-14 LAB — CONVERTED CEMR LAB
Albumin: 4.1 g/dL (ref 3.5–5.2)
Alkaline Phosphatase: 124 units/L — ABNORMAL HIGH (ref 39–117)
BUN: 9 mg/dL (ref 6–23)
CO2: 28 meq/L (ref 19–32)
Cholesterol: 254 mg/dL — ABNORMAL HIGH (ref 0–200)
Glucose, Bld: 108 mg/dL — ABNORMAL HIGH (ref 70–99)
HDL: 67 mg/dL (ref >39)
Potassium: 4.5 meq/L (ref 3.5–5.3)
Sodium: 143 meq/L (ref 135–145)
Total Bilirubin: 0.3 mg/dL (ref 0.3–1.2)
Total Protein: 6.6 g/dL (ref 6.0–8.3)
Triglycerides: 163 mg/dL — ABNORMAL HIGH (ref <150)

## 2007-11-20 ENCOUNTER — Encounter
Admission: RE | Admit: 2007-11-20 | Discharge: 2007-11-23 | Payer: Self-pay | Admitting: Physical Medicine & Rehabilitation

## 2007-11-23 ENCOUNTER — Ambulatory Visit: Payer: Self-pay | Admitting: Physical Medicine & Rehabilitation

## 2008-01-07 ENCOUNTER — Telehealth (INDEPENDENT_AMBULATORY_CARE_PROVIDER_SITE_OTHER): Payer: Self-pay | Admitting: Pharmacy Technician

## 2008-01-16 ENCOUNTER — Ambulatory Visit: Payer: Self-pay | Admitting: Internal Medicine

## 2008-02-07 ENCOUNTER — Ambulatory Visit: Payer: Self-pay | Admitting: Internal Medicine

## 2008-02-07 DIAGNOSIS — N951 Menopausal and female climacteric states: Secondary | ICD-10-CM | POA: Insufficient documentation

## 2008-02-07 LAB — CONVERTED CEMR LAB
Glucose, Urine, Semiquant: NEGATIVE
Nitrite: NEGATIVE
Protein, U semiquant: NEGATIVE

## 2008-02-11 ENCOUNTER — Telehealth (INDEPENDENT_AMBULATORY_CARE_PROVIDER_SITE_OTHER): Payer: Self-pay | Admitting: Pharmacy Technician

## 2008-02-19 ENCOUNTER — Encounter
Admission: RE | Admit: 2008-02-19 | Discharge: 2008-02-20 | Payer: Self-pay | Admitting: Physical Medicine & Rehabilitation

## 2008-02-19 ENCOUNTER — Inpatient Hospital Stay (HOSPITAL_COMMUNITY): Admission: AD | Admit: 2008-02-19 | Discharge: 2008-02-19 | Payer: Self-pay | Admitting: Gynecology

## 2008-02-20 ENCOUNTER — Ambulatory Visit: Payer: Self-pay | Admitting: Physical Medicine & Rehabilitation

## 2008-03-10 ENCOUNTER — Telehealth: Payer: Self-pay | Admitting: *Deleted

## 2008-03-18 ENCOUNTER — Telehealth (INDEPENDENT_AMBULATORY_CARE_PROVIDER_SITE_OTHER): Payer: Self-pay | Admitting: Pharmacy Technician

## 2008-03-25 ENCOUNTER — Encounter: Payer: Self-pay | Admitting: *Deleted

## 2008-03-25 ENCOUNTER — Ambulatory Visit: Payer: Self-pay | Admitting: Internal Medicine

## 2008-03-25 ENCOUNTER — Encounter (INDEPENDENT_AMBULATORY_CARE_PROVIDER_SITE_OTHER): Payer: Self-pay | Admitting: *Deleted

## 2008-03-25 LAB — CONVERTED CEMR LAB
Nitrite: NEGATIVE
Specific Gravity, Urine: 1.004 — ABNORMAL LOW (ref 1.005–1.03)
Urine Glucose: NEGATIVE mg/dL
pH: 5.5 (ref 5.0–8.0)

## 2008-03-26 ENCOUNTER — Encounter: Admission: RE | Admit: 2008-03-26 | Discharge: 2008-05-12 | Payer: Self-pay | Admitting: Endocrinology

## 2008-05-01 ENCOUNTER — Telehealth (INDEPENDENT_AMBULATORY_CARE_PROVIDER_SITE_OTHER): Payer: Self-pay | Admitting: Pharmacy Technician

## 2008-05-12 ENCOUNTER — Telehealth (INDEPENDENT_AMBULATORY_CARE_PROVIDER_SITE_OTHER): Payer: Self-pay | Admitting: Pharmacy Technician

## 2008-05-13 ENCOUNTER — Encounter: Admission: RE | Admit: 2008-05-13 | Discharge: 2008-05-13 | Payer: Self-pay | Admitting: Endocrinology

## 2008-05-14 ENCOUNTER — Ambulatory Visit: Payer: Self-pay | Admitting: Internal Medicine

## 2008-05-14 DIAGNOSIS — E119 Type 2 diabetes mellitus without complications: Secondary | ICD-10-CM | POA: Insufficient documentation

## 2008-05-15 ENCOUNTER — Encounter: Payer: Self-pay | Admitting: Internal Medicine

## 2008-05-20 ENCOUNTER — Ambulatory Visit: Payer: Self-pay | Admitting: Internal Medicine

## 2008-05-22 LAB — CONVERTED CEMR LAB
Alkaline Phosphatase: 122 units/L — ABNORMAL HIGH (ref 39–117)
BUN: 12 mg/dL (ref 6–23)
Basophils Relative: 0 % (ref 0–1)
Eosinophils Absolute: 0 10*3/uL (ref 0.0–0.7)
Eosinophils Relative: 0 % (ref 0–5)
Glucose, Bld: 88 mg/dL (ref 70–99)
HCT: 39.6 % (ref 36.0–46.0)
HDL: 56 mg/dL (ref >39)
LDL Cholesterol: 106 mg/dL — ABNORMAL HIGH (ref 0–99)
Lymphs Abs: 1.5 10*3/uL (ref 0.7–4.0)
MCHC: 31.1 g/dL (ref 30.0–36.0)
MCV: 93.4 fL (ref 78.0–100.0)
Platelets: 295 10*3/uL (ref 150–400)
Sodium: 142 meq/L (ref 135–145)
Total Bilirubin: 0.2 mg/dL — ABNORMAL LOW (ref 0.3–1.2)
Total Protein: 6.9 g/dL (ref 6.0–8.3)
Triglycerides: 210 mg/dL — ABNORMAL HIGH (ref <150)
VLDL: 42 mg/dL — ABNORMAL HIGH (ref 0–40)
WBC: 5.9 10*3/uL (ref 4.0–10.5)

## 2008-05-23 ENCOUNTER — Encounter (INDEPENDENT_AMBULATORY_CARE_PROVIDER_SITE_OTHER): Payer: Self-pay | Admitting: Internal Medicine

## 2008-05-23 ENCOUNTER — Ambulatory Visit: Payer: Self-pay | Admitting: *Deleted

## 2008-05-27 ENCOUNTER — Encounter
Admission: RE | Admit: 2008-05-27 | Discharge: 2008-08-13 | Payer: Self-pay | Admitting: Physical Medicine & Rehabilitation

## 2008-05-28 ENCOUNTER — Ambulatory Visit: Payer: Self-pay | Admitting: Physical Medicine & Rehabilitation

## 2008-06-12 ENCOUNTER — Ambulatory Visit: Payer: Self-pay | Admitting: Internal Medicine

## 2008-07-15 ENCOUNTER — Telehealth (INDEPENDENT_AMBULATORY_CARE_PROVIDER_SITE_OTHER): Payer: Self-pay | Admitting: Pharmacy Technician

## 2008-07-17 ENCOUNTER — Emergency Department (HOSPITAL_COMMUNITY): Admission: EM | Admit: 2008-07-17 | Discharge: 2008-07-18 | Payer: Self-pay | Admitting: Emergency Medicine

## 2008-08-05 ENCOUNTER — Telehealth (INDEPENDENT_AMBULATORY_CARE_PROVIDER_SITE_OTHER): Payer: Self-pay | Admitting: Pharmacy Technician

## 2008-08-05 ENCOUNTER — Encounter: Payer: Self-pay | Admitting: Internal Medicine

## 2008-08-13 ENCOUNTER — Ambulatory Visit: Payer: Self-pay | Admitting: Physical Medicine & Rehabilitation

## 2008-08-13 ENCOUNTER — Ambulatory Visit: Payer: Self-pay | Admitting: Internal Medicine

## 2008-08-19 ENCOUNTER — Telehealth (INDEPENDENT_AMBULATORY_CARE_PROVIDER_SITE_OTHER): Payer: Self-pay | Admitting: Pharmacy Technician

## 2008-08-27 ENCOUNTER — Telehealth (INDEPENDENT_AMBULATORY_CARE_PROVIDER_SITE_OTHER): Payer: Self-pay | Admitting: Pharmacy Technician

## 2008-09-12 LAB — CONVERTED CEMR LAB
Albumin: 4.8 g/dL (ref 3.5–5.2)
BUN: 11 mg/dL (ref 6–23)
CO2: 19 meq/L (ref 19–32)
Calcium: 9.4 mg/dL (ref 8.4–10.5)
Chloride: 104 meq/L (ref 96–112)
Glucose, Bld: 84 mg/dL (ref 70–99)
Lymphocytes Relative: 28 % (ref 12–46)
Lymphs Abs: 2 10*3/uL (ref 0.7–4.0)
MCV: 91.9 fL (ref 78.0–100.0)
Monocytes Relative: 7 % (ref 3–12)
Neutro Abs: 4.6 10*3/uL (ref 1.7–7.7)
Neutrophils Relative %: 65 % (ref 43–77)
Potassium: 3.9 meq/L (ref 3.5–5.3)
RBC: 4.3 M/uL (ref 3.87–5.11)
Total Protein: 7.3 g/dL (ref 6.0–8.3)
WBC: 7.2 10*3/uL (ref 4.0–10.5)

## 2008-09-15 ENCOUNTER — Encounter: Payer: Self-pay | Admitting: Internal Medicine

## 2008-09-22 ENCOUNTER — Encounter: Payer: Self-pay | Admitting: Internal Medicine

## 2008-09-25 ENCOUNTER — Telehealth (INDEPENDENT_AMBULATORY_CARE_PROVIDER_SITE_OTHER): Payer: Self-pay | Admitting: Pharmacy Technician

## 2008-09-26 ENCOUNTER — Telehealth (INDEPENDENT_AMBULATORY_CARE_PROVIDER_SITE_OTHER): Payer: Self-pay | Admitting: Pharmacy Technician

## 2008-10-03 ENCOUNTER — Telehealth: Payer: Self-pay | Admitting: *Deleted

## 2008-10-09 ENCOUNTER — Inpatient Hospital Stay (HOSPITAL_COMMUNITY): Admission: AD | Admit: 2008-10-09 | Discharge: 2008-10-09 | Payer: Self-pay | Admitting: Obstetrics & Gynecology

## 2008-10-15 ENCOUNTER — Telehealth (INDEPENDENT_AMBULATORY_CARE_PROVIDER_SITE_OTHER): Payer: Self-pay | Admitting: Pharmacy Technician

## 2008-10-27 ENCOUNTER — Telehealth: Payer: Self-pay | Admitting: *Deleted

## 2008-11-03 ENCOUNTER — Encounter
Admission: RE | Admit: 2008-11-03 | Discharge: 2008-11-05 | Payer: Self-pay | Admitting: Physical Medicine & Rehabilitation

## 2008-11-05 ENCOUNTER — Ambulatory Visit: Payer: Self-pay | Admitting: Physical Medicine & Rehabilitation

## 2008-11-07 ENCOUNTER — Telehealth (INDEPENDENT_AMBULATORY_CARE_PROVIDER_SITE_OTHER): Payer: Self-pay | Admitting: Pharmacy Technician

## 2008-11-10 ENCOUNTER — Ambulatory Visit (HOSPITAL_COMMUNITY): Admission: RE | Admit: 2008-11-10 | Discharge: 2008-11-10 | Payer: Self-pay | Admitting: Internal Medicine

## 2008-11-18 ENCOUNTER — Encounter
Admission: RE | Admit: 2008-11-18 | Discharge: 2009-01-28 | Payer: Self-pay | Admitting: Physical Medicine & Rehabilitation

## 2008-11-20 ENCOUNTER — Telehealth (INDEPENDENT_AMBULATORY_CARE_PROVIDER_SITE_OTHER): Payer: Self-pay | Admitting: *Deleted

## 2008-11-20 ENCOUNTER — Encounter: Payer: Self-pay | Admitting: Internal Medicine

## 2008-11-20 ENCOUNTER — Ambulatory Visit: Payer: Self-pay | Admitting: *Deleted

## 2008-11-20 LAB — CONVERTED CEMR LAB
Blood Glucose, Fingerstick: 77
Hgb A1c MFr Bld: 5.8 %

## 2008-11-26 ENCOUNTER — Ambulatory Visit: Payer: Self-pay | Admitting: Internal Medicine

## 2008-11-26 LAB — CONVERTED CEMR LAB
Bilirubin Urine: NEGATIVE
Hemoglobin, Urine: NEGATIVE
Ketones, ur: NEGATIVE mg/dL
Nitrite: NEGATIVE
Urobilinogen, UA: 0.2 (ref 0.0–1.0)

## 2008-12-01 ENCOUNTER — Telehealth: Payer: Self-pay | Admitting: *Deleted

## 2008-12-03 ENCOUNTER — Telehealth: Payer: Self-pay | Admitting: Internal Medicine

## 2009-01-09 ENCOUNTER — Telehealth: Payer: Self-pay | Admitting: *Deleted

## 2009-01-21 ENCOUNTER — Ambulatory Visit: Payer: Self-pay | Admitting: Internal Medicine

## 2009-01-21 ENCOUNTER — Telehealth (INDEPENDENT_AMBULATORY_CARE_PROVIDER_SITE_OTHER): Payer: Self-pay | Admitting: *Deleted

## 2009-01-21 LAB — CONVERTED CEMR LAB
Bilirubin Urine: NEGATIVE
Blood Glucose, Fingerstick: 96
Glucose, Urine, Semiquant: NEGATIVE
Protein, U semiquant: NEGATIVE
Specific Gravity, Urine: 1.01

## 2009-01-27 ENCOUNTER — Ambulatory Visit: Payer: Self-pay | Admitting: Internal Medicine

## 2009-01-27 LAB — CONVERTED CEMR LAB
ALT: 13 units/L (ref 0–35)
AST: 14 units/L (ref 0–37)
Albumin: 4.8 g/dL (ref 3.5–5.2)
Alkaline Phosphatase: 94 units/L (ref 39–117)
BUN: 9 mg/dL (ref 6–23)
Calcium: 9.6 mg/dL (ref 8.4–10.5)
Chloride: 103 meq/L (ref 96–112)
HDL: 66 mg/dL (ref >39)
LDL Cholesterol: 87 mg/dL (ref 0–99)
Microalb Creat Ratio: 10.3 mg/g (ref 0.0–30.0)
Potassium: 5.1 meq/L (ref 3.5–5.3)
Sodium: 138 meq/L (ref 135–145)
TSH: 6.581 microintl units/mL — ABNORMAL HIGH (ref 0.350–4.500)
Total CHOL/HDL Ratio: 2.7
Total Protein: 7 g/dL (ref 6.0–8.3)
VLDL: 22 mg/dL (ref 0–40)

## 2009-01-28 ENCOUNTER — Ambulatory Visit: Payer: Self-pay | Admitting: Physical Medicine & Rehabilitation

## 2009-01-28 ENCOUNTER — Encounter
Admission: RE | Admit: 2009-01-28 | Discharge: 2009-04-28 | Payer: Self-pay | Admitting: Physical Medicine & Rehabilitation

## 2009-02-09 ENCOUNTER — Ambulatory Visit: Payer: Self-pay | Admitting: Internal Medicine

## 2009-02-09 LAB — CONVERTED CEMR LAB: OCCULT 2: NEGATIVE

## 2009-02-20 ENCOUNTER — Encounter: Payer: Self-pay | Admitting: Internal Medicine

## 2009-02-25 ENCOUNTER — Ambulatory Visit: Payer: Self-pay | Admitting: Internal Medicine

## 2009-02-26 ENCOUNTER — Encounter: Payer: Self-pay | Admitting: Internal Medicine

## 2009-02-27 ENCOUNTER — Encounter: Payer: Self-pay | Admitting: Infectious Disease

## 2009-02-27 ENCOUNTER — Encounter (INDEPENDENT_AMBULATORY_CARE_PROVIDER_SITE_OTHER): Payer: Self-pay | Admitting: Internal Medicine

## 2009-02-27 ENCOUNTER — Observation Stay (HOSPITAL_COMMUNITY): Admission: AD | Admit: 2009-02-27 | Discharge: 2009-02-28 | Payer: Self-pay | Admitting: Infectious Disease

## 2009-02-27 ENCOUNTER — Ambulatory Visit: Payer: Self-pay | Admitting: Internal Medicine

## 2009-02-27 ENCOUNTER — Ambulatory Visit: Payer: Self-pay | Admitting: Infectious Disease

## 2009-02-27 ENCOUNTER — Telehealth (INDEPENDENT_AMBULATORY_CARE_PROVIDER_SITE_OTHER): Payer: Self-pay | Admitting: Internal Medicine

## 2009-02-27 DIAGNOSIS — G459 Transient cerebral ischemic attack, unspecified: Secondary | ICD-10-CM | POA: Insufficient documentation

## 2009-02-28 ENCOUNTER — Encounter (INDEPENDENT_AMBULATORY_CARE_PROVIDER_SITE_OTHER): Payer: Self-pay | Admitting: Internal Medicine

## 2009-03-02 ENCOUNTER — Ambulatory Visit: Payer: Self-pay | Admitting: Vascular Surgery

## 2009-03-02 ENCOUNTER — Ambulatory Visit (HOSPITAL_COMMUNITY): Admission: RE | Admit: 2009-03-02 | Discharge: 2009-03-02 | Payer: Self-pay | Admitting: Infectious Disease

## 2009-03-02 ENCOUNTER — Encounter: Payer: Self-pay | Admitting: Infectious Disease

## 2009-03-02 HISTORY — PX: TRANSTHORACIC ECHOCARDIOGRAM: SHX275

## 2009-03-13 ENCOUNTER — Ambulatory Visit: Payer: Self-pay | Admitting: Internal Medicine

## 2009-04-17 ENCOUNTER — Telehealth: Payer: Self-pay | Admitting: *Deleted

## 2009-04-24 ENCOUNTER — Ambulatory Visit (HOSPITAL_COMMUNITY): Admission: RE | Admit: 2009-04-24 | Discharge: 2009-04-24 | Payer: Self-pay | Admitting: Orthopaedic Surgery

## 2009-05-01 ENCOUNTER — Encounter
Admission: RE | Admit: 2009-05-01 | Discharge: 2009-07-02 | Payer: Self-pay | Admitting: Physical Medicine & Rehabilitation

## 2009-05-04 ENCOUNTER — Ambulatory Visit: Payer: Self-pay | Admitting: Physical Medicine & Rehabilitation

## 2009-05-11 ENCOUNTER — Ambulatory Visit: Payer: Self-pay | Admitting: Internal Medicine

## 2009-05-11 ENCOUNTER — Ambulatory Visit (HOSPITAL_COMMUNITY): Admission: RE | Admit: 2009-05-11 | Discharge: 2009-05-11 | Payer: Self-pay | Admitting: Endocrinology

## 2009-05-12 ENCOUNTER — Encounter (INDEPENDENT_AMBULATORY_CARE_PROVIDER_SITE_OTHER): Payer: Self-pay | Admitting: Internal Medicine

## 2009-05-12 LAB — CONVERTED CEMR LAB
BUN: 11 mg/dL (ref 6–23)
CO2: 27 meq/L (ref 19–32)
Calcium: 9.1 mg/dL (ref 8.4–10.5)
Creatinine, Ser: 0.65 mg/dL (ref 0.40–1.20)
Glucose, Bld: 93 mg/dL (ref 70–99)
Sodium: 143 meq/L (ref 135–145)
TSH: 6.79 microintl units/mL — ABNORMAL HIGH (ref 0.350–4.5)

## 2009-06-02 ENCOUNTER — Ambulatory Visit: Payer: Self-pay | Admitting: Internal Medicine

## 2009-06-02 LAB — CONVERTED CEMR LAB
ALT: 10 units/L (ref 0–35)
Alkaline Phosphatase: 91 units/L (ref 39–117)
Basophils Absolute: 0 10*3/uL (ref 0.0–0.1)
Basophils Relative: 0 % (ref 0–1)
CO2: 21 meq/L (ref 19–32)
Creatinine, Ser: 0.7 mg/dL (ref 0.40–1.20)
Eosinophils Absolute: 0 10*3/uL (ref 0.0–0.7)
Glucose, Bld: 86 mg/dL (ref 70–99)
Lipase: 27 units/L (ref 0–75)
MCHC: 32.8 g/dL (ref 30.0–36.0)
MCV: 93 fL (ref >=78.0)
Monocytes Relative: 6 % (ref 3–12)
Neutro Abs: 2.9 10*3/uL (ref 1.7–7.7)
Neutrophils Relative %: 61 % (ref 43–77)
RBC: 3.74 M/uL — ABNORMAL LOW (ref 3.87–5.11)
RDW: 13.6 % (ref 11.5–15.5)
Total Bilirubin: 0.3 mg/dL (ref 0.3–1.2)

## 2009-06-10 ENCOUNTER — Ambulatory Visit (HOSPITAL_COMMUNITY): Admission: RE | Admit: 2009-06-10 | Discharge: 2009-06-10 | Payer: Self-pay | Admitting: Internal Medicine

## 2009-06-10 ENCOUNTER — Ambulatory Visit: Payer: Self-pay | Admitting: Internal Medicine

## 2009-06-10 ENCOUNTER — Telehealth: Payer: Self-pay | Admitting: Internal Medicine

## 2009-06-10 ENCOUNTER — Encounter: Payer: Self-pay | Admitting: Infectious Disease

## 2009-06-10 LAB — CONVERTED CEMR LAB
Protein, ur: NEGATIVE mg/dL
Specific Gravity, Urine: 1.008 (ref 1.005–1.0)
Urine Glucose: NEGATIVE mg/dL
pH: 5.5 (ref 5.0–8.0)

## 2009-06-18 ENCOUNTER — Encounter: Payer: Self-pay | Admitting: Internal Medicine

## 2009-06-18 ENCOUNTER — Telehealth: Payer: Self-pay | Admitting: Internal Medicine

## 2009-06-22 ENCOUNTER — Telehealth: Payer: Self-pay | Admitting: Internal Medicine

## 2009-07-28 ENCOUNTER — Encounter
Admission: RE | Admit: 2009-07-28 | Discharge: 2009-10-26 | Payer: Self-pay | Admitting: Physical Medicine & Rehabilitation

## 2009-07-28 ENCOUNTER — Ambulatory Visit: Payer: Self-pay | Admitting: Physical Medicine & Rehabilitation

## 2009-07-29 ENCOUNTER — Ambulatory Visit: Payer: Self-pay | Admitting: Internal Medicine

## 2009-07-29 LAB — CONVERTED CEMR LAB
Hemoglobin, Urine: NEGATIVE
Protein, ur: NEGATIVE mg/dL
Urine Glucose: NEGATIVE mg/dL
Urobilinogen, UA: 0.2 (ref 0.0–1.0)

## 2009-09-03 ENCOUNTER — Encounter: Payer: Self-pay | Admitting: Internal Medicine

## 2009-09-04 ENCOUNTER — Ambulatory Visit (HOSPITAL_COMMUNITY): Admission: RE | Admit: 2009-09-04 | Discharge: 2009-09-04 | Payer: Self-pay | Admitting: Orthopaedic Surgery

## 2009-10-07 ENCOUNTER — Ambulatory Visit: Payer: Self-pay | Admitting: Internal Medicine

## 2009-10-07 LAB — CONVERTED CEMR LAB
ALT: 10 units/L (ref 0–35)
AST: 9 units/L (ref 0–37)
Blood Glucose, Fingerstick: 103
Calcium: 9.1 mg/dL (ref 8.4–10.5)
Chloride: 95 meq/L — ABNORMAL LOW (ref 96–112)
Creatinine, Ser: 0.76 mg/dL (ref 0.40–1.20)
Hgb A1c MFr Bld: 6 %
Total Bilirubin: 0.3 mg/dL (ref 0.3–1.2)

## 2009-10-16 ENCOUNTER — Ambulatory Visit: Admission: RE | Admit: 2009-10-16 | Discharge: 2009-10-16 | Payer: Self-pay | Admitting: Orthopaedic Surgery

## 2009-10-20 ENCOUNTER — Telehealth: Payer: Self-pay | Admitting: Internal Medicine

## 2009-10-20 ENCOUNTER — Ambulatory Visit: Payer: Self-pay | Admitting: Physical Medicine & Rehabilitation

## 2009-10-26 ENCOUNTER — Telehealth: Payer: Self-pay | Admitting: *Deleted

## 2009-10-26 ENCOUNTER — Ambulatory Visit: Payer: Self-pay | Admitting: Internal Medicine

## 2009-10-26 LAB — CONVERTED CEMR LAB
Bilirubin Urine: NEGATIVE
Calcium: 9.4 mg/dL (ref 8.4–10.5)
Creatinine, Ser: 0.69 mg/dL (ref 0.40–1.20)
HCT: 36.2 % (ref 36.0–46.0)
Hemoglobin, Urine: NEGATIVE
Iron: 58 ug/dL (ref 42–145)
MCV: 91.9 fL (ref >=78.0)
Platelets: 327 10*3/uL (ref 150–400)
Protein, ur: NEGATIVE mg/dL
RBC / HPF: NONE SEEN (ref <3)
RDW: 13.5 % (ref 11.5–15.5)
TIBC: 428 ug/dL (ref 250–470)
UIBC: 370 ug/dL
Urine Glucose: NEGATIVE mg/dL
WBC, UA: NONE SEEN cells/hpf (ref <3)

## 2009-10-30 ENCOUNTER — Ambulatory Visit (HOSPITAL_COMMUNITY): Admission: RE | Admit: 2009-10-30 | Discharge: 2009-10-30 | Payer: Self-pay | Admitting: Internal Medicine

## 2009-11-03 ENCOUNTER — Telehealth: Payer: Self-pay | Admitting: Internal Medicine

## 2009-11-05 ENCOUNTER — Ambulatory Visit: Payer: Self-pay | Admitting: Internal Medicine

## 2009-11-06 LAB — CONVERTED CEMR LAB: TSH: 3.734 microintl units/mL (ref 0.350–4.5)

## 2009-11-18 ENCOUNTER — Telehealth: Payer: Self-pay | Admitting: Internal Medicine

## 2009-11-19 ENCOUNTER — Ambulatory Visit: Payer: Self-pay | Admitting: Internal Medicine

## 2009-11-19 LAB — CONVERTED CEMR LAB: OCCULT 1: NEGATIVE

## 2009-11-26 ENCOUNTER — Ambulatory Visit: Payer: Self-pay | Admitting: Internal Medicine

## 2009-12-02 ENCOUNTER — Ambulatory Visit (HOSPITAL_COMMUNITY): Admission: RE | Admit: 2009-12-02 | Discharge: 2009-12-02 | Payer: Self-pay | Admitting: Internal Medicine

## 2009-12-30 ENCOUNTER — Ambulatory Visit: Payer: Self-pay | Admitting: Internal Medicine

## 2009-12-30 LAB — CONVERTED CEMR LAB
Cholesterol: 193 mg/dL (ref 0–200)
HDL: 71 mg/dL (ref >39)
Hgb A1c MFr Bld: 5.5 %
LDL Cholesterol: 95 mg/dL (ref 0–99)
Triglycerides: 136 mg/dL (ref <150)

## 2010-02-05 ENCOUNTER — Telehealth: Payer: Self-pay | Admitting: *Deleted

## 2010-02-08 ENCOUNTER — Encounter
Admission: RE | Admit: 2010-02-08 | Discharge: 2010-02-15 | Payer: Self-pay | Admitting: Physical Medicine & Rehabilitation

## 2010-02-15 ENCOUNTER — Ambulatory Visit: Payer: Self-pay | Admitting: Physical Medicine & Rehabilitation

## 2010-02-19 ENCOUNTER — Telehealth: Payer: Self-pay | Admitting: Internal Medicine

## 2010-03-26 ENCOUNTER — Telehealth: Payer: Self-pay | Admitting: *Deleted

## 2010-04-27 ENCOUNTER — Encounter
Admission: RE | Admit: 2010-04-27 | Discharge: 2010-05-05 | Payer: Self-pay | Source: Home / Self Care | Attending: Physical Medicine & Rehabilitation | Admitting: Physical Medicine & Rehabilitation

## 2010-05-05 ENCOUNTER — Ambulatory Visit: Payer: Self-pay | Admitting: Physical Medicine & Rehabilitation

## 2010-05-18 ENCOUNTER — Telehealth (INDEPENDENT_AMBULATORY_CARE_PROVIDER_SITE_OTHER): Payer: Self-pay | Admitting: *Deleted

## 2010-05-19 ENCOUNTER — Ambulatory Visit: Payer: Self-pay | Admitting: Internal Medicine

## 2010-05-19 LAB — CONVERTED CEMR LAB
AST: 15 units/L (ref 0–37)
Alkaline Phosphatase: 99 units/L (ref 39–117)
BUN: 12 mg/dL (ref 6–23)
Bacteria, UA: NONE SEEN
Creatinine, Ser: 0.73 mg/dL (ref 0.40–1.20)
Creatinine, Urine: 83.7 mg/dL
Glucose, Bld: 91 mg/dL (ref 70–99)
Glucose, Urine, Semiquant: NEGATIVE
HDL: 72 mg/dL (ref >39)
Hemoglobin, Urine: NEGATIVE
Hgb A1c MFr Bld: 5.8 %
Ketones, ur: NEGATIVE mg/dL
Ketones, urine, test strip: NEGATIVE
LDL Cholesterol: 97 mg/dL (ref 0–99)
Microalb, Ur: 0.75 mg/dL (ref 0.00–1.89)
Nitrite: NEGATIVE
Nitrite: NEGATIVE
Potassium: 4.4 meq/L (ref 3.5–5.3)
Protein, ur: NEGATIVE mg/dL
Total Bilirubin: 0.3 mg/dL (ref 0.3–1.2)
Total CHOL/HDL Ratio: 2.6
Triglycerides: 100 mg/dL (ref <150)
Urobilinogen, UA: 0.2
VLDL: 20 mg/dL (ref 0–40)
pH: 7

## 2010-05-25 ENCOUNTER — Telehealth: Payer: Self-pay | Admitting: Internal Medicine

## 2010-06-25 ENCOUNTER — Telehealth: Payer: Self-pay | Admitting: *Deleted

## 2010-06-28 ENCOUNTER — Telehealth: Payer: Self-pay | Admitting: Internal Medicine

## 2010-07-28 ENCOUNTER — Ambulatory Visit
Admission: RE | Admit: 2010-07-28 | Discharge: 2010-07-28 | Payer: Self-pay | Source: Home / Self Care | Attending: Internal Medicine | Admitting: Internal Medicine

## 2010-07-28 LAB — CONVERTED CEMR LAB
Blood Glucose, Fingerstick: 97
Blood, UA: NEGATIVE
Casts: NONE SEEN /lpf
Crystals: NONE SEEN
Ketones, ur: NEGATIVE mg/dL
Nitrite: NEGATIVE
Specific Gravity, Urine: 1.016 (ref 1.005–1.03)
Urine Glucose: NEGATIVE mg/dL
pH: 7 (ref 5.0–8.0)

## 2010-08-01 ENCOUNTER — Encounter: Payer: Self-pay | Admitting: Internal Medicine

## 2010-08-05 ENCOUNTER — Encounter: Payer: Self-pay | Admitting: Internal Medicine

## 2010-08-08 LAB — CONVERTED CEMR LAB
BUN: 17 mg/dL (ref 6–23)
Bilirubin Urine: NEGATIVE
CO2: 22 meq/L (ref 19–32)
Calcium: 9.4 mg/dL (ref 8.4–10.5)
Chloride: 104 meq/L (ref 96–112)
Cortisol, Plasma: 1.6 ug/dL
Creatinine, Ser: 0.57 mg/dL (ref 0.40–1.20)
Eosinophils Absolute: 0 10*3/uL (ref 0.0–0.7)
Eosinophils Relative: 0 % (ref 0–5)
Glucose, Bld: 88 mg/dL (ref 70–99)
HCT: 41.3 % (ref 36.0–46.0)
Hemoglobin: 13.3 g/dL (ref 12.0–15.0)
Lymphocytes Relative: 25 % (ref 12–46)
Lymphs Abs: 2.7 10*3/uL (ref 0.7–4.0)
MCV: 90.6 fL (ref 78.0–100.0)
Monocytes Absolute: 0.5 10*3/uL (ref 0.1–1.0)
Monocytes Relative: 5 % (ref 3–12)
Nitrite: NEGATIVE
Platelets: 351 10*3/uL (ref 150–400)
Protein, ur: NEGATIVE mg/dL
RBC: 4.56 M/uL (ref 3.87–5.11)
Specific Gravity, Urine: 1.009 (ref 1.005–1.03)
TSH: 0.157 microintl units/mL — ABNORMAL LOW (ref 0.350–5.50)
Total Bilirubin: 0.4 mg/dL (ref 0.3–1.2)
Urobilinogen, UA: 0.2 (ref 0.0–1.0)
WBC: 10.8 10*3/uL — ABNORMAL HIGH (ref 4.0–10.5)

## 2010-08-12 NOTE — Assessment & Plan Note (Signed)
Summary: f/u one month [mkj]   Vital Signs:  Patient profile:   65 year old female Height:      67 inches Weight:      181.4 pounds BMI:     28.51 Temp:     98.2 degrees F oral Pulse rate:   72 / minute BP sitting:   119 / 79  (right arm)  Vitals Entered By: Filomena Jungling NT II (July 29, 2009 11:30 AM) CC: FOLLOW-UP VISIT , , Depression Is Patient Diabetic? Yes Did you bring your meter with you today? No Pain Assessment Patient in pain? no      Nutritional Status BMI of 25 - 29 = overweight  Have you ever been in a relationship where you felt threatened, hurt or afraid?No   Does patient need assistance? Functional Status Self care Ambulation Normal   Primary Care Provider:  Margarito Liner MD  CC:  FOLLOW-UP VISIT , , and Depression.  History of Present Illness: Patient returns for follow up of her diabetes mellitus, hypertension, and other chronic medical problems.  She reports some return of urinary frequency over the past few days, and is concerned that she may have a recurrent urinary tract infection.  Otherwise, she is doing well without acute complaints.  She reports that she is compliant with her medications.  She reports that her blood sugars have been reasonably well controlled.  She continues to follow up at the pain clinic for management of her chronic pain, and with other specialists as previously noted.  Depression History:      The patient denies a depressed mood most of the day and a diminished interest in her usual daily activities.         Preventive Screening-Counseling & Management  Alcohol-Tobacco     Smoking Status: never  Caffeine-Diet-Exercise     Does Patient Exercise: yes     Type of exercise: walking     Times/week: 1  Current Medications (verified): 1)  Tiazac 240 Mg Cp24 (Diltiazem Hcl Er Beads) .... Take 1 Capsule By Mouth Once A Day 2)  Klonopin 0.5 Mg Tabs (Clonazepam) .... Take 1 Tablet By Mouth Once A Day As Needed 3)  Enalapril  Maleate 10 Mg Tabs (Enalapril Maleate) .... Take 1 Tablet By Mouth Once A Day 4)  Levothyroxine Sodium 88 Mcg Tabs (Levothyroxine Sodium) .... Take 1 Tablet By Mouth Once A Day 5)  Imitrex 50 Mg Tabs (Sumatriptan Succinate) .... Take 1 Tablet By Mouth At Onset of Headache As Directed 6)  Lyrica 150 Mg Caps (Pregabalin) .... Take 1 Capsule By Mouth Four Times A Day 7)  Colace 100 Mg Caps (Docusate Sodium) .... Take 2 Capsules By Mouth Once A Day 8)  Prilosec 20 Mg Cpdr (Omeprazole) .... Take 2 Capsules By Mouth Once A Day 9)  Proventil Hfa 108 (90 Base) Mcg/act Aers (Albuterol Sulfate) .... Inhale 2 Puffs Four Times A Day As Needed 10)  Effexor Xr 150 Mg Cp24 (Venlafaxine Hcl) .... Take 2 Capsules By Mouth Once A Day 11)  Aspirin 325 Mg Tabs (Aspirin) .... Take One Tablet Daily. 12)  Oxycodone-Acetaminophen 5-500 Mg Caps (Oxycodone-Acetaminophen) .... Take 1 Tablet By Mouth Every 6 Hours As Needed For Pain 13)  Calcium Carbonate-Vitamin D 600-400 Mg-Unit  Tabs (Calcium Carbonate-Vitamin D) .... Take 1 Tablet By Mouth Once A Day 14)  Flonase 50 Mcg/act  Susp (Fluticasone Propionate) .... Take 2 Sprays in Each Nostril Once Daily 15)  Hyomax-Sl 0.125 Mg  Subl (Hyoscyamine  Sulfate) .... Dissolve 1 To 2 Tablets Under The Tongue (Or By Mouth) Every 4 Hours As Needed For Chest Pain. 16)  Metformin Hcl 500 Mg Xr24h-Tab (Metformin Hcl) .... Take 1 Tablet By Mouth Once A Day 17)  Pravachol 20 Mg Tabs (Pravastatin Sodium) .... Take One Tablet Daily To Lower Cholesterol. 18)  Metamucil 30.9 % Powd (Psyllium) .... Take 1 Teaspoonful in 8 Ounces of Water or Juice 1-2 Times Daily. 19)  Naproxen 500 Mg Tabs (Naproxen) .... Take 1 Tablet By Mouth Two Times A Day After A Meal 20)  Cyclobenzaprine Hcl 5 Mg Tabs (Cyclobenzaprine Hcl) .... Take 1 Tablet By Mouth Every 8 Hours As Needed  Allergies: 1)  ! Penicillin V Potassium (Penicillin V Potassium) 2)  ! Nsaids 3)  ! Voltaren  Review of Systems General:  Denies  fever. GI:  Denies abdominal pain, nausea, and vomiting; patient reports that the symptom of upper abdominal fullness or questionable mass has resolved. GU:  Complains of urinary frequency.  Physical Exam  General:  alert, no distress Lungs:  normal respiratory effort, normal breath sounds, no crackles, and no wheezes.   Heart:  normal rate, regular rhythm, no murmur, no gallop, and no rub.   Abdomen:  soft, non-tender, normal bowel sounds, no distention, no masses, no guarding, no hepatomegaly, and no splenomegaly.   Extremities:  no edema   Impression & Recommendations:  Problem # 1:  DIABETES MELLITUS, TYPE II (ICD-250.00) Patient's diabetes mellitus is well controlled on current regimen.  Plan is to continue current medications, and continue home capillary blood glucose monitoring.   Her updated medication list for this problem includes:    Enalapril Maleate 10 Mg Tabs (Enalapril maleate) .Marland Kitchen... Take 1 tablet by mouth once a day    Aspirin 325 Mg Tabs (Aspirin) .Marland Kitchen... Take one tablet daily.    Metformin Hcl 500 Mg Xr24h-tab (Metformin hcl) .Marland Kitchen... Take 1 tablet by mouth once a day  Labs Reviewed: Creat: 0.70 (06/02/2009)    Reviewed HgBA1c results: 5.7 (06/02/2009)  5.7 (02/27/2009)  Orders: Ophthalmology Referral (Ophthalmology)  Problem # 2:  HYPERTENSION (ICD-401.9) Patient's blood pressure is well controlled on current regimen.  Plan is to continue current antihypertensive medications, and follow up blood pressure at next visit.   Her updated medication list for this problem includes:    Tiazac 240 Mg Cp24 (Diltiazem hcl er beads) .Marland Kitchen... Take 1 capsule by mouth once a day    Enalapril Maleate 10 Mg Tabs (Enalapril maleate) .Marland Kitchen... Take 1 tablet by mouth once a day  BP today: 119/79 Prior BP: 134/77 (06/10/2009)  Labs Reviewed: K+: 4.6 (06/02/2009) Creat: : 0.70 (06/02/2009)   Chol: 175 (01/27/2009)   HDL: 66 (01/27/2009)   LDL: 87 (01/27/2009)   TG: 109  (01/27/2009)  Problem # 3:  HYPOTHYROIDISM (ICD-244.9) Patient has no symptoms of thyroid dysfunction.  The plan is to continue current dose of levothyroxine.  Her updated medication list for this problem includes:    Levothyroxine Sodium 88 Mcg Tabs (Levothyroxine sodium) .Marland Kitchen... Take 1 tablet by mouth once a day  Labs Reviewed: TSH: 6.790 (05/12/2009)    HgBA1c: 5.7 (06/02/2009) Chol: 175 (01/27/2009)   HDL: 66 (01/27/2009)   LDL: 87 (01/27/2009)   TG: 109 (01/27/2009)  Problem # 4:  HYPERCHOLESTEROLEMIA (ICD-272.0) Patient is doing well on her current statin medication, without apparent side effects.  Her updated medication list for this problem includes:    Pravachol 20 Mg Tabs (Pravastatin sodium) .Marland Kitchen... Take one tablet  daily to lower cholesterol.  Labs Reviewed: SGOT: 10 (06/02/2009)   SGPT: 10 (06/02/2009)   HDL:66 (01/27/2009), 56 (05/20/2008)  LDL:87 (01/27/2009), 106 (78/29/5621)  Chol:175 (01/27/2009), 204 (05/20/2008)  Trig:109 (01/27/2009), 210 (05/20/2008)  Problem # 5:  URINARY FREQUENCY (ICD-788.41) Plan is to check a urinalysis and urine culture.  Orders: T-Culture, Urine ([id number]) T-Urinalysis ([id number])  Problem # 6:  ? of ABDOMINAL MASS (ICD-789.30) I am unable to appreciate any abdominal mass on my exam.  Patient reports that her sensation of fullness in her upper abdomen has resolved.  Review of the notes from her visit to a surgeon shows that he also was unable to feel any abnormality and did not feel that she had a hernia.  I discussed today with her the option of obtaining a CT scan of her abdomen.  Given the resolution of her symptom, she favors not doing a scan at this time and reassessing at the next visit, which I think is a reasonable plan.  Complete Medication List: 1)  Tiazac 240 Mg Cp24 (Diltiazem hcl er beads) .... Take 1 capsule by mouth once a day 2)  Klonopin 0.5 Mg Tabs (Clonazepam) .... Take 1 tablet by mouth once a day as needed 3)   Enalapril Maleate 10 Mg Tabs (Enalapril maleate) .... Take 1 tablet by mouth once a day 4)  Levothyroxine Sodium 88 Mcg Tabs (Levothyroxine sodium) .... Take 1 tablet by mouth once a day 5)  Imitrex 50 Mg Tabs (Sumatriptan succinate) .... Take 1 tablet by mouth at onset of headache as directed 6)  Lyrica 150 Mg Caps (Pregabalin) .... Take 1 capsule by mouth four times a day 7)  Colace 100 Mg Caps (Docusate sodium) .... Take 2 capsules by mouth once a day 8)  Prilosec 20 Mg Cpdr (Omeprazole) .... Take 2 capsules by mouth once a day 9)  Proventil Hfa 108 (90 Base) Mcg/act Aers (Albuterol sulfate) .... Inhale 2 puffs four times a day as needed 10)  Effexor Xr 150 Mg Cp24 (Venlafaxine hcl) .... Take 2 capsules by mouth once a day 11)  Aspirin 325 Mg Tabs (Aspirin) .... Take one tablet daily. 12)  Oxycodone-acetaminophen 5-500 Mg Caps (Oxycodone-acetaminophen) .... Take 1 tablet by mouth every 6 hours as needed for pain 13)  Calcium Carbonate-vitamin D 600-400 Mg-unit Tabs (Calcium carbonate-vitamin d) .... Take 1 tablet by mouth once a day 14)  Flonase 50 Mcg/act Susp (Fluticasone propionate) .... Take 2 sprays in each nostril once daily 15)  Hyomax-sl 0.125 Mg Subl (Hyoscyamine sulfate) .... Dissolve 1 to 2 tablets under the tongue (or by mouth) every 4 hours as needed for chest pain. 16)  Metformin Hcl 500 Mg Xr24h-tab (Metformin hcl) .... Take 1 tablet by mouth once a day 17)  Pravachol 20 Mg Tabs (Pravastatin sodium) .... Take one tablet daily to lower cholesterol. 18)  Metamucil 30.9 % Powd (Psyllium) .... Take 1 teaspoonful in 8 ounces of water or juice 1-2 times daily. 19)  Naproxen 500 Mg Tabs (Naproxen) .... Take 1 tablet by mouth two times a day after a meal 20)  Cyclobenzaprine Hcl 5 Mg Tabs (Cyclobenzaprine hcl) .... Take 1 tablet by mouth every 8 hours as needed  Patient Instructions: 1)  Please schedule a follow-up appointment in 2 months. 2)  Please keep appointment for eye  exam.  Prevention & Chronic Care Immunizations   Influenza vaccine: Historical  (04/10/2009)   Influenza vaccine deferral: Not available  (01/27/2009)   Influenza vaccine due: 03/11/2010  Tetanus booster: 06/30/2004: Historical    Pneumococcal vaccine: Pneumovax (Medicare)  (05/14/2008)    H. zoster vaccine: Not documented   H. zoster vaccine deferral: Deferred  (01/27/2009)  Colorectal Screening   Hemoccult: negative x 3  (02/25/2009)   Hemoccult action/deferral: Ordered  (01/27/2009)   Hemoccult due: 03/2010    Colonoscopy: Results: Normal. Procedure performed by Dr. Yancey Flemings.  (02/14/2005)  Other Screening   Pap smear: Not documented   Pap smear action/deferral: Not indicated S/P hysterectomy  (01/27/2009)    Mammogram: ASSESSMENT: Negative - BI-RADS 1^MM DIGITAL SCREENING  (11/10/2008)   Mammogram due: 11/2008    DXA bone density scan: Not documented   Smoking status: never  (07/29/2009)  Diabetes Mellitus   HgbA1C: 5.7  (06/02/2009)   HgbA1C action/deferral: Ordered  (06/02/2009)    Eye exam: Not documented   Diabetic eye exam action/deferral: Ophthalmology referral  (07/29/2009)    Foot exam: yes  (01/27/2009)   Foot exam action/deferral: Do today   High risk foot: No  (01/27/2009)   Foot care education: Done  (01/27/2009)    Urine microalbumin/creatinine ratio: 10.3  (01/27/2009)   Urine microalbumin action/deferral: Ordered    Diabetes flowsheet reviewed?: Yes   Progress toward A1C goal: At goal  Lipids   Total Cholesterol: 175  (01/27/2009)   Lipid panel action/deferral: Lipid Panel ordered   LDL: 87  (01/27/2009)   LDL Direct: Not documented   HDL: 66  (01/27/2009)   Triglycerides: 109  (01/27/2009)    SGOT (AST): 10  (06/02/2009)   BMP action: Ordered   SGPT (ALT): 10  (06/02/2009)   Alkaline phosphatase: 91  (06/02/2009)   Total bilirubin: 0.3  (06/02/2009)    Lipid flowsheet reviewed?: Yes   Progress toward LDL goal: At  goal  Hypertension   Last Blood Pressure: 119 / 79  (07/29/2009)   Serum creatinine: 0.70  (06/02/2009)   BMP action: Ordered   Serum potassium 4.6  (06/02/2009)    Hypertension flowsheet reviewed?: Yes   Progress toward BP goal: At goal  Self-Management Support :   Personal Goals (by the next clinic visit) :     Personal A1C goal: 7  (01/27/2009)     Personal blood pressure goal: 130/80  (01/27/2009)     Personal LDL goal: 100  (01/27/2009)    Patient will work on the following items until the next clinic visit to reach self-care goals:     Medications and monitoring: take my medicines every day, check my blood sugar, examine my feet every day  (07/29/2009)     Eating: drink diet soda or water instead of juice or soda, eat more vegetables, use fresh or frozen vegetables, eat foods that are low in salt, eat baked foods instead of fried foods, eat fruit for snacks and desserts, limit or avoid alcohol  (07/29/2009)     Activity: take the stairs instead of the elevator, park at the far end of the parking lot  (07/29/2009)     Home glucose monitoring frequency: 1 time daily  (01/27/2009)    Diabetes self-management support: CBG self-monitoring log, Written self-care plan  (07/29/2009)   Diabetes care plan printed    Diabetes self-management support not done because: Good outcomes  (01/27/2009)    Hypertension self-management support: Written self-care plan  (07/29/2009)   Hypertension self-care plan printed.    Lipid self-management support: Written self-care plan  (07/29/2009)   Lipid self-care plan printed.   Nursing Instructions: Refer for screening diabetic eye  exam (see order)    Process Orders Check Orders Results:     [facility]: ABN not required for this insurance Tests Sent for requisitioning (July 30, 2009 7:54 PM):     07/29/2009: [facility] -- T-Culture, Urine [[id number]] (signed)     07/29/2009: [facility]  -- T-Urinalysis [[id number]] (signed)

## 2010-08-12 NOTE — Progress Notes (Signed)
Summary: RTC  Phone Note Outgoing Call   Call placed by: Angelina Ok RN,  March 26, 2010 9:50 AM Call placed to: Patient Summary of Call: RTC to pt who had left a message on pt's answwring machine that the Clinics had returned her call. Angelina Ok RN  March 26, 2010 10:03 AM

## 2010-08-12 NOTE — Assessment & Plan Note (Signed)
Summary: EST-ROUTINE CHECKUP/CH   Vital Signs:  Patient profile:   65 year old female Height:      67 inches (170.18 cm) Weight:      185.2 pounds (84.86 kg) BMI:     29.11 Temp:     97.8 degrees F (36.56 degrees C) oral BP sitting:   124 / 81  (left arm) Cuff size:   regular  Vitals Entered By: Theotis Barrio NT II (December 30, 2009 10:29 AM) CC: LEFT BACK PAIN AT A 6 OUT OF 10/ PAIN GOING DOWN LEFT LEG/MEDICATION REFILL/ COMPLAINS OF BROKEN AND CHIPPED TOOTH Is Patient Diabetic? Yes Did you bring your meter with you today? Yes Pain Assessment Patient in pain? yes     Location: back Intensity: 6 Type: dull Onset of pain  Chronic Nutritional Status BMI of 25 - 29 = overweight  Have you ever been in a relationship where you felt threatened, hurt or afraid?No   Does patient need assistance? Functional Status Self care Ambulation Normal Comments PATIENT WALKED WITH ASSISTANCE OF A CANE   Primary Care Provider:  Margarito Liner MD  CC:  LEFT BACK PAIN AT A 6 OUT OF 10/ PAIN GOING DOWN LEFT LEG/MEDICATION REFILL/ COMPLAINS OF BROKEN AND CHIPPED TOOTH.  History of Present Illness: Occasional subjective memory problems; overall says things are going well, no acute complaints.  Does mention a chronically broken tooth.     Depression History:      The patient is having a depressed mood most of the day but denies diminished interest in her usual daily activities.         Mental Status Assessment:  Mental Status Exam: (value/max value)    Orientation to Time: 5/5    Orientation to Place: 5/5    Registration: 3/3    Attention/Calculation: 5/5    Recall: 3/3    Language-name 2 objects: 2/2    Language-repeat: 1/1    Language-follow 3-step command: 3/3    Language-read and follow direction: 1/1    Write a sentence: 1/1    Copy design: 1/1    MSE Total score: 30/30   Preventive Screening-Counseling & Management  Alcohol-Tobacco     Alcohol drinks/day: 0     Smoking  Status: never  Caffeine-Diet-Exercise     Does Patient Exercise: no     Type of exercise: walking     Times/week: 1  Current Medications (verified): 1)  Lyrica 150 Mg Caps (Pregabalin) .... Take 1 Capsule By Mouth Three Times A Day 2)  Effexor Xr 150 Mg Cp24 (Venlafaxine Hcl) .... Take 1 Capsule By Mouth Two Times A Day 3)  Klonopin 0.5 Mg Tabs (Clonazepam) .... Take 1 Tablet By Mouth Once A Day As Needed 4)  Pravachol 20 Mg Tabs (Pravastatin Sodium) .... Take One Tablet Daily To Lower Cholesterol. 5)  Taztia Xt 240 Mg Xr24h-Cap (Diltiazem Hcl Er Beads) .... Take 1 Tablet By Mouth Once A Day 6)  Enalapril Maleate 10 Mg Tabs (Enalapril Maleate) .... Take 1 Tablet By Mouth Once A Day 7)  Levothyroxine Sodium 88 Mcg Tabs (Levothyroxine Sodium) .... Take 1 Tablet By Mouth Once A Day 8)  Proventil Hfa 108 (90 Base) Mcg/act Aers (Albuterol Sulfate) .... Inhale 2 Puffs Four Times A Day As Needed 9)  Aspirin 81 Mg Tbec (Aspirin) .... Take 1 Tablet By Mouth Once A Day 10)  Oxycodone-Acetaminophen 5-500 Mg Caps (Oxycodone-Acetaminophen) .... Take 1 Tablet By Mouth Every 6 Hours As Needed For Pain 11)  Flonase 50 Mcg/act  Susp (Fluticasone Propionate) .... Take 2 Sprays in Each Nostril Once Daily 12)  Naproxen 500 Mg Tabs (Naproxen) .... Take 1 Tablet By Mouth Two Times A Day After A Meal 13)  Cyclobenzaprine Hcl 5 Mg Tabs (Cyclobenzaprine Hcl) .... Take 1 Tablet By Mouth Every 8 Hours As Needed 14)  Trazodone Hcl 50 Mg Tabs (Trazodone Hcl) .... Take 1/4 Tablet By Mouth At Bedtime 15)  Imitrex 50 Mg Tabs (Sumatriptan Succinate) .... Take 1 Tablet By Mouth At Onset of Headache As Directed 16)  Prilosec 20 Mg Cpdr (Omeprazole) .... Take 2 Capsules By Mouth Once A Day 17)  Hyomax-Sl 0.125 Mg  Subl (Hyoscyamine Sulfate) .... Dissolve 1 To 2 Tablets Under The Tongue (Or By Mouth) Every 4 Hours As Needed For Chest Pain. 18)  Calcium Carbonate-Vitamin D 600-400 Mg-Unit  Tabs (Calcium Carbonate-Vitamin D) ....  Take 1 Tablet By Mouth Once A Day 19)  Colace 100 Mg Caps (Docusate Sodium) .... Take 2 Capsules By Mouth Once A Day 20)  Diphenhydramine-Zinc Acetate 2-0.1 % Crea (Diphenhydramine-Zinc Acetate) .... Apply To The Affected Area As Needed  Allergies: 1)  ! Penicillin V Potassium (Penicillin V Potassium) 2)  ! Nsaids 3)  ! Voltaren  Physical Exam  General:  alert, no distress Mouth:  broken upper molar on left Lungs:  normal respiratory effort, normal breath sounds, no crackles, and no wheezes.   Heart:  normal rate, regular rhythm, no murmur, no gallop, and no rub.   Abdomen:  soft, non-tender, normal bowel sounds, no hepatomegaly, and no splenomegaly.   Extremities:  no edema   Impression & Recommendations:  Problem # 1:  DIABETES MELLITUS, TYPE II (ICD-250.00) Patient's diabetes mellitus is well controlled on current regimen.  Plan is to continue current medications, and continue home capillary blood glucose monitoring.   Her updated medication list for this problem includes:    Enalapril Maleate 10 Mg Tabs (Enalapril maleate) .Marland Kitchen... Take 1 tablet by mouth once a day    Aspirin 81 Mg Tbec (Aspirin) .Marland Kitchen... Take 1 tablet by mouth once a day  Orders: T-Hgb A1C (in-house) ([id number]) T- Capillary Blood Glucose ([id number])  Labs Reviewed: Creat: 0.69 (10/26/2009)     Last Eye Exam: Mild background diabetic retinopathy OS and OD. Exam at [facility].  (09/03/2009) Reviewed HgBA1c results: 5.5 (12/30/2009)  6.0 (10/07/2009)  Problem # 2:  HYPERTENSION (ICD-401.9) Patient's blood pressure is well controlled on current regimen.  Plan is to continue current antihypertensive medications, and follow up blood pressure at next visit.   Her updated medication list for this problem includes:    Taztia Xt 240 Mg Xr24h-cap (Diltiazem hcl er beads) .Marland Kitchen... Take 1 tablet by mouth once a day    Enalapril Maleate 10 Mg Tabs (Enalapril maleate) .Marland Kitchen... Take 1 tablet by mouth once a day  BP  today: 124/81 Prior BP: 135/81 (11/26/2009)  Labs Reviewed: K+: 4.4 (10/26/2009) Creat: : 0.69 (10/26/2009)   Chol: 175 (01/27/2009)   HDL: 66 (01/27/2009)   LDL: 87 (01/27/2009)   TG: 109 (01/27/2009)  Problem # 3:  HYPERCHOLESTEROLEMIA (ICD-272.0) Patient is doing well on current dose of statin. Will check a lipid panel.  Her updated medication list for this problem includes:    Pravachol 20 Mg Tabs (Pravastatin sodium) .Marland Kitchen... Take one tablet daily to lower cholesterol.  Orders: T-Lipid Profile [id number])  Labs Reviewed: SGOT: 9 (10/07/2009)   SGPT: 10 (10/07/2009)   HDL:66 (01/27/2009), 56 (05/20/2008)  LDL:87 (01/27/2009),  106 (05/20/2008)  Chol:175 (01/27/2009), 204 (05/20/2008)  Trig:109 (01/27/2009), 210 (05/20/2008)  Problem # 4:  FIBROMYALGIA (ICD-729.1) Patient has a chronic pain syndrome and is doing well on her current regimen, which is managed at the pain clinic.  Problem # 5:  BROKEN TOOTH (YNW-295.62) Will refer for assistance finding a dentist through the [facility] for health management program.  Orders: Dental Referral (Dentist)  Complete Medication List: 1)  Lyrica 150 Mg Caps (Pregabalin) .... Take 1 capsule by mouth three times a day 2)  Effexor Xr 150 Mg Cp24 (Venlafaxine hcl) .... Take 1 capsule by mouth two times a day 3)  Klonopin 0.5 Mg Tabs (Clonazepam) .... Take 1 tablet by mouth once a day as needed 4)  Pravachol 20 Mg Tabs (Pravastatin sodium) .... Take one tablet daily to lower cholesterol. 5)  Taztia Xt 240 Mg Xr24h-cap (Diltiazem hcl er beads) .... Take 1 tablet by mouth once a day 6)  Enalapril Maleate 10 Mg Tabs (Enalapril maleate) .... Take 1 tablet by mouth once a day 7)  Levothyroxine Sodium 88 Mcg Tabs (Levothyroxine sodium) .... Take 1 tablet by mouth once a day 8)  Proventil Hfa 108 (90 Base) Mcg/act Aers (Albuterol sulfate) .... Inhale 2 puffs four times a day as needed 9)  Aspirin 81 Mg Tbec (Aspirin) .... Take 1 tablet by mouth once  a day 10)  Oxycodone-acetaminophen 5-500 Mg Caps (Oxycodone-acetaminophen) .... Take 1 tablet by mouth every 6 hours as needed for pain 11)  Flonase 50 Mcg/act Susp (Fluticasone propionate) .... Take 2 sprays in each nostril once daily 12)  Cyclobenzaprine Hcl 5 Mg Tabs (Cyclobenzaprine hcl) .... Take 1 tablet by mouth every 8 hours as needed 13)  Trazodone Hcl 50 Mg Tabs (Trazodone hcl) .... Take 1/4 tablet by mouth at bedtime 14)  Imitrex 50 Mg Tabs (Sumatriptan succinate) .... Take 1 tablet by mouth at onset of headache as directed 15)  Prilosec 20 Mg Cpdr (Omeprazole) .... Take 2 capsules by mouth once a day 16)  Hyomax-sl 0.125 Mg Subl (Hyoscyamine sulfate) .... Dissolve 1 to 2 tablets under the tongue (or by mouth) every 4 hours as needed for chest pain. 17)  Calcium Carbonate-vitamin D 600-400 Mg-unit Tabs (Calcium carbonate-vitamin d) .... Take 1 tablet by mouth once a day 18)  Colace 100 Mg Caps (Docusate sodium) .... Take 2 capsules by mouth once a day 19)  Diphenhydramine-zinc Acetate 2-0.1 % Crea (Diphenhydramine-zinc acetate) .... Apply to the affected area as needed  Other Orders: T-Vitamin B12 ([id number]) T-Folic Acid; RBC ([id number]) T-TSH ([id number])  Patient Instructions: 1)  Please schedule a follow-up appointment in 3 months. 2)  Please see dentist regarding broken tooth with lost filling.  Process Orders Check Orders Results:     [facility]: ABN not required for this insurance Tests Sent for requisitioning (January 06, 2010 2:57 PM):     12/30/2009: [facility] -- T-Lipid Profile [phone] (signed)     12/30/2009: [facility] -- T-Vitamin B12 [[id number]] (signed)     12/30/2009: [facility] -- T-Folic Acid; RBC [[id number]] (signed)     12/30/2009: [facility] -- T-TSH [id number] (signed)    Prevention & Chronic Care Immunizations   Influenza vaccine: Historical   (04/10/2009)   Influenza vaccine deferral: Deferred  (12/30/2009)   Influenza vaccine due: 03/11/2010    Tetanus booster: 06/30/2004: Historical   Tetanus booster due: 06/30/2014    Pneumococcal vaccine: Pneumovax (Medicare)  (05/14/2008)    H. zoster  vaccine: Not documented   H. zoster vaccine deferral: Not indicated  (11/05/2009)  Colorectal Screening   Hemoccult: negative x 3  (02/25/2009)   Hemoccult action/deferral: Ordered  (10/26/2009)   Hemoccult due: 03/2010    Colonoscopy: Results: Normal. Procedure performed by Dr. Yancey Flemings.  (02/14/2005)   Colonoscopy due: 02/15/2015  Other Screening   Pap smear: Not documented   Pap smear action/deferral: Not indicated S/P hysterectomy  (01/27/2009)    Mammogram: ASSESSMENT: Negative - BI-RADS 1^MM DIGITAL SCREENING  (12/02/2009)   Mammogram action/deferral: Deferred-2 yr interval  (11/26/2009)   Mammogram due: 11/10/2009    DXA bone density scan: Not documented   Smoking status: never  (12/30/2009)  Diabetes Mellitus   HgbA1C: 5.5  (12/30/2009)   HgbA1C action/deferral: Ordered  (06/02/2009)   Hemoglobin A1C due: 01/07/2010    Eye exam: Mild background diabetic retinopathy OS and OD. Exam at [facility].   (09/03/2009)   Diabetic eye exam action/deferral: Ophthalmology referral  (07/29/2009)   Eye exam due: 09/2010    Foot exam: yes  (01/27/2009)   Foot exam action/deferral: Do today   High risk foot: No  (01/27/2009)   Foot care education: Done  (01/27/2009)   Foot exam due: 01/27/2010    Urine microalbumin/creatinine ratio: 10.3  (01/27/2009)   Urine microalbumin action/deferral: Ordered   Urine microalbumin/cr due: 01/27/2010    Diabetes flowsheet reviewed?: Yes   Progress toward A1C goal: At goal  Lipids   Total Cholesterol: 175  (01/27/2009)   Lipid panel action/deferral: Lipid Panel ordered   LDL: 87  (01/27/2009)   LDL Direct: Not documented   HDL: 66  (01/27/2009)   Triglycerides: 109   (01/27/2009)   Lipid panel due: 01/27/2010    SGOT (AST): 9  (10/07/2009)   BMP action: Ordered   SGPT (ALT): 10  (10/07/2009)   Alkaline phosphatase: 96  (10/07/2009)   Total bilirubin: 0.3  (10/07/2009)    Lipid flowsheet reviewed?: Yes   Progress toward LDL goal: At goal  Hypertension   Last Blood Pressure: 124 / 81  (12/30/2009)   Serum creatinine: 0.69  (10/26/2009)   BMP action: Ordered   Serum potassium 4.4  (10/26/2009)   Basic metabolic panel due: 04/09/2010    Hypertension flowsheet reviewed?: Yes   Progress toward BP goal: At goal  Self-Management Support :   Personal Goals (by the next clinic visit) :     Personal A1C goal: 7  (01/27/2009)     Personal blood pressure goal: 130/80  (01/27/2009)     Personal LDL goal: 100  (01/27/2009)    Patient will work on the following items until the next clinic visit to reach self-care goals:     Medications and monitoring: take my medicines every day, check my blood sugar, bring all of my medications to every visit, examine my feet every day  (12/30/2009)     Eating: drink diet soda or water instead of juice or soda, eat more vegetables, use fresh or frozen vegetables, eat foods that are low in salt, eat fruit for snacks and desserts, limit or avoid alcohol  (12/30/2009)     Activity: take a 30 minute walk every day, take the stairs instead of the elevator  (11/26/2009)     Home glucose monitoring frequency: 1 time daily  (01/27/2009)    Diabetes self-management support: Resources for patients handout, Written self-care plan  (12/30/2009)   Diabetes care plan printed    Diabetes self-management support not done because:  Good outcomes  (01/27/2009)    Hypertension self-management support: Resources for patients handout, Written self-care plan  (12/30/2009)   Hypertension self-care plan printed.    Lipid self-management support: Resources for patients handout, Written self-care plan  (12/30/2009)   Lipid self-care plan  printed.    Self-management comments: PATIENT STATES SHE HAS JOINED A GYM.       Resource handout printed.  Laboratory Results   Blood Tests   Date/Time Received: December 30, 2009 11:00 AM  Date/Time Reported: Burke Keels  December 30, 2009 11:00 AM   HGBA1C: 5.5%   (Normal Range: Non-Diabetic - 3-6%   Control Diabetic - 6-8%) CBG Fasting:: 93mg /dL

## 2010-08-12 NOTE — Letter (Signed)
Summary: New Patient letter  [facility]  [address] [place], [place] [address]   Phone: [phone]  Fax: [phone]       08/05/2010 MRN: [id number]  [name] [name] [address] [place], [place]  [address]  Dear [name],  Welcome to the Gastroenterology Division at [facility].    You are scheduled to see Dr.  Marina Goodell on 09-14-10 at 10:15am on the 3rd floor at [facility], [address].  We ask that you try to arrive at our office 15 minutes prior to your appointment time to allow for check-in.  We would like you to complete the enclosed self-administered evaluation form prior to your visit and bring it with you on the day of your appointment.  We will review it with you.  Also, please bring a complete list of all your medications or, if you prefer, bring the medication bottles and we will list them.  Please bring your insurance card so that we may make a copy of it.  If your insurance requires a referral to see a specialist, please bring your referral form from your primary care physician.  Co-payments are due at the time of your visit and may be paid by cash, check or credit card.     Your office visit will consist of a consult with your physician (includes a physical exam), any laboratory testing he/she may order, scheduling of any necessary diagnostic testing (e.g. x-ray, ultrasound, CT-scan), and scheduling of a procedure (e.g. Endoscopy, Colonoscopy) if required.  Please allow enough time on your schedule to allow for any/all of these possibilities.    If you cannot keep your appointment, please call [phone] to cancel or reschedule prior to your appointment date.  This allows [place] the opportunity to schedule an appointment for another patient in need of care.  If you do not cancel or reschedule by 5 p.m. the business day prior to your appointment date, you will be charged a $50.00 late cancellation/no-show fee.    Thank you for choosing  [facility] for your medical needs.  We appreciate the opportunity to care for you.  Please visit [place] at our website  to learn more about our practice.                     Sincerely,                                                             The Gastroenterology Division

## 2010-08-12 NOTE — Progress Notes (Signed)
Summary: med refill/gp  Phone Note Refill Request Message from:  Fax from Pharmacy on February 19, 2010 4:54 PM  Refills Requested: Medication #1:  LEVOTHYROXINE SODIUM 88 MCG TABS Take 1 tablet by mouth once a day   Last Refilled: 01/20/2010 Last appt. June 22 w/labs.   Method Requested: Electronic Initial call taken by: Chinita Pester RN,  February 19, 2010 4:54 PM  Follow-up for Phone Call        Refilled electronically.  Follow-up by: Margarito Liner MD,  February 22, 2010 4:14 PM    Prescriptions: LEVOTHYROXINE SODIUM 88 MCG TABS (LEVOTHYROXINE SODIUM) Take 1 tablet by mouth once a day  #31 x 5   Entered and Authorized by:   Margarito Liner MD   Signed by:   Margarito Liner MD on 02/22/2010   Method used:   Electronically to        [facility]  [phone]* (retail)       [address]       [place], [place]  [address]       Ph: [phone] or [phone]       Fax: [phone]   RxID:   [id number]

## 2010-08-12 NOTE — Progress Notes (Signed)
Summary: refill/gg  Phone Note Refill Request  on October 20, 2009 11:33 AM  Refills Requested: Medication #1:  PRAVACHOL 20 MG TABS Take one tablet daily to lower cholesterol.  Method Requested: Electronic Initial call taken by: Merrie Roof RN,  October 20, 2009 11:34 AM  Follow-up for Phone Call        Refilled electronically.  Follow-up by: Margarito Liner MD,  October 21, 2009 3:31 PM    Prescriptions: PRAVACHOL 20 MG TABS (PRAVASTATIN SODIUM) Take one tablet daily to lower cholesterol.  #32 x 11   Entered and Authorized by:   Margarito Liner MD   Signed by:   Margarito Liner MD on 10/21/2009   Method used:   Electronically to        [facility]  [phone]* (retail)       [address]       [place], [place]  [address]       Ph: [phone] or [phone]       Fax: [phone]   RxID:   [id number]

## 2010-08-12 NOTE — Progress Notes (Signed)
Summary: PREVENTIVE COLONOSCOPY  Phone Note Outgoing Call   Summary of Call: Found last Colonscopy located in the patient's old Paper Chart.  Last colonoscopy was performed on 02/14/2005 by Dr. Hilarie Fredrickson.  Information given to nurse. Initial call taken by: Shon Hough,  May 18, 2010 3:04 PM

## 2010-08-12 NOTE — Assessment & Plan Note (Signed)
Summary: RECHECK/SB.   Vital Signs:  Patient profile:   65 year old female Height:      67 inches (170.18 cm) Weight:      185.5 pounds (84.32 kg) BMI:     29.16 Temp:     97.0 degrees F (36.11 degrees C) oral Pulse rate:   73 / minute BP sitting:   131 / 80  (right arm)  Vitals Entered By: Stanton Kidney Ditzler RN (November 05, 2009 10:19 AM) Is Patient Diabetic? Yes Did you bring your meter with you today? No Pain Assessment Patient in pain? yes     Location: low abd Intensity: 4 Type: dull Onset of pain  some time Nutritional Status BMI of 25 - 29 = overweight Nutritional Status Detail appetite fair  Have you ever been in a relationship where you felt threatened, hurt or afraid?denies   Does patient need assistance? Functional Status Self care Ambulation Normal Comments FU - problems with constipation. Burning in rectum. Discomfort low abd. CBG this AM 105.   Primary Care Provider:  Margarito Liner MD   History of Present Illness: [name] [name] is pleasant 65 yo female with complex medical history oultine below who presents to [facility] with main concern of persistant RLQ pain that she has had evaluated on numerous occasions with multiple diagnostic test being all negative. She is not sure what to do about it and contributes the problem to chronic constipation. She has been on numerous medications and has been going to pain clinic for several years but she tells me that not much success has been accomplished so far in terms of pain control. She has no other new concerns at the time.  No recent sicknesses or hospitalizaitons. No episodes of chest pain, SOB, palpitations, no fever or chills. No specific urinary concerns. No recent changes in appetite, weight, sleep patterns, mood. She reports being compliant with the medications as takes it all as recommended.    Depression History:      The patient denies a depressed mood most of the day and a diminished interest in her usual daily  activities.  Positive alarm features for depression include fatigue (loss of energy).  However, she denies significant weight loss, significant weight gain, insomnia, hypersomnia, psychomotor agitation, psychomotor retardation, feelings of worthlessness (guilt), impaired concentration (indecisiveness), and recurrent thoughts of death or suicide.        The patient denies that she feels like life is not worth living, denies that she wishes that she were dead, and denies that she has thought about ending her life.         Preventive Screening-Counseling & Management  Alcohol-Tobacco     Alcohol drinks/day: 0     Smoking Status: never  Caffeine-Diet-Exercise     Does Patient Exercise: no     Type of exercise: walking     Times/week: 1      Drug Use:  no.    Problems Prior to Update: 1)  Diabetes Mellitus, Type II  (ICD-250.00) 2)  Hypertension  (ICD-401.9) 3)  Hypercholesterolemia  (ICD-272.0) 4)  Hypothyroidism  (ICD-244.9) 5)  Urinary Frequency  (ICD-788.41) 6)  ? of Abdominal Mass  (ICD-789.30) 7)  Tia  (ICD-435.9) 8)  Uti  (ICD-599.0) 9)  Depression  (ICD-311) 10)  Anxiety  (ICD-300.00) 11)  Gerd  (ICD-530.81) 12)  Chest Pain  (ICD-786.50) 13)  Hiatal Hernia  (ICD-553.3) 14)  Dysphagia Unspecified  (ICD-787.20) 15)  Hot Flashes  (ICD-627.2) 16)  Leg Pain, Right  (  ICD-729.5) 17)  Fibromyalgia  (ICD-729.1) 18)  Fatigue  (ICD-780.79) 19)  Alkaline Phosphatase, Elevated  (ICD-790.5) 20)  Shoulder Pain, Left  (ICD-719.41) 21)  Allergic Rhinitis  (ICD-477.9) 22)  Common Migraine  (ICD-346.10) 23)  Back Pain  (ICD-724.5) 24)  ? of Glucocorticoid Deficiency  (ICD-255.41) 25)  Constipation  (ICD-564.00) 26)  Family History Diabetes 1st Degree Relative  (ICD-V18.0)  Medications Prior to Update: 1)  Lyrica 150 Mg Caps (Pregabalin) .... Take 1 Capsule By Mouth Four Times A Day 2)  Effexor Xr 150 Mg Cp24 (Venlafaxine Hcl) .... Take 2 Capsules By Mouth Once A Day 3)  Klonopin 0.5  Mg Tabs (Clonazepam) .... Take 1 Tablet By Mouth Once A Day As Needed 4)  Pravachol 20 Mg Tabs (Pravastatin Sodium) .... Take One Tablet Daily To Lower Cholesterol. 5)  Taztia Xt 240 Mg Xr24h-Cap (Diltiazem Hcl Er Beads) .... Take 1 Tablet By Mouth Once A Day 6)  Enalapril Maleate 10 Mg Tabs (Enalapril Maleate) .... Take 1 Tablet By Mouth Once A Day 7)  Levothyroxine Sodium 88 Mcg Tabs (Levothyroxine Sodium) .... Take 1 Tablet By Mouth Once A Day 8)  Proventil Hfa 108 (90 Base) Mcg/act Aers (Albuterol Sulfate) .... Inhale 2 Puffs Four Times A Day As Needed 9)  Aspirin 325 Mg Tabs (Aspirin) .... Take One Tablet Daily. 10)  Oxycodone-Acetaminophen 5-500 Mg Caps (Oxycodone-Acetaminophen) .... Take 1 Tablet By Mouth Every 6 Hours As Needed For Pain 11)  Flonase 50 Mcg/act  Susp (Fluticasone Propionate) .... Take 2 Sprays in Each Nostril Once Daily 12)  Metamucil 30.9 % Powd (Psyllium) .... Take 1 Teaspoonful in 8 Ounces of Water or Juice 1-2 Times Daily. 13)  Naproxen 500 Mg Tabs (Naproxen) .... Take 1 Tablet By Mouth Two Times A Day After A Meal 14)  Cyclobenzaprine Hcl 5 Mg Tabs (Cyclobenzaprine Hcl) .... Take 1 Tablet By Mouth Every 8 Hours As Needed 15)  Trazodone Hcl 50 Mg Tabs (Trazodone Hcl) .... Take 1/4 Tablet By Mouth At Bedtime 16)  Imitrex 50 Mg Tabs (Sumatriptan Succinate) .... Take 1 Tablet By Mouth At Onset of Headache As Directed 17)  Prilosec 20 Mg Cpdr (Omeprazole) .... Take 2 Capsules By Mouth Once A Day 18)  Hyomax-Sl 0.125 Mg  Subl (Hyoscyamine Sulfate) .... Dissolve 1 To 2 Tablets Under The Tongue (Or By Mouth) Every 4 Hours As Needed For Chest Pain. 19)  Calcium Carbonate-Vitamin D 600-400 Mg-Unit  Tabs (Calcium Carbonate-Vitamin D) .... Take 1 Tablet By Mouth Once A Day 20)  Colace 100 Mg Caps (Docusate Sodium) .... Take 2 Capsules By Mouth Once A Day  Current Medications (verified): 1)  Lyrica 150 Mg Caps (Pregabalin) .... Take 1 Capsule By Mouth Two Times A Day 2)  Effexor  Xr 150 Mg Cp24 (Venlafaxine Hcl) .... Take 1 Capsule By Mouth Once A Day 3)  Klonopin 0.5 Mg Tabs (Clonazepam) .... Take 1 Tablet By Mouth Once A Day As Needed 4)  Pravachol 20 Mg Tabs (Pravastatin Sodium) .... Take One Tablet Daily To Lower Cholesterol. 5)  Taztia Xt 240 Mg Xr24h-Cap (Diltiazem Hcl Er Beads) .... Take 1 Tablet By Mouth Once A Day 6)  Enalapril Maleate 10 Mg Tabs (Enalapril Maleate) .... Take 1 Tablet By Mouth Once A Day 7)  Levothyroxine Sodium 88 Mcg Tabs (Levothyroxine Sodium) .... Take 1 Tablet By Mouth Once A Day 8)  Proventil Hfa 108 (90 Base) Mcg/act Aers (Albuterol Sulfate) .... Inhale 2 Puffs Four Times A Day As Needed  9)  Aspirin 325 Mg Tabs (Aspirin) .... Take One Tablet Daily. 10)  Oxycodone-Acetaminophen 5-500 Mg Caps (Oxycodone-Acetaminophen) .... Take 1 Tablet By Mouth Every 6 Hours As Needed For Pain 11)  Flonase 50 Mcg/act  Susp (Fluticasone Propionate) .... Take 2 Sprays in Each Nostril Once Daily 12)  Metamucil 30.9 % Powd (Psyllium) .... Take 1 Teaspoonful in 8 Ounces of Water or Juice 1-2 Times Daily. 13)  Naproxen 500 Mg Tabs (Naproxen) .... Take 1 Tablet By Mouth Two Times A Day After A Meal 14)  Cyclobenzaprine Hcl 5 Mg Tabs (Cyclobenzaprine Hcl) .... Take 1 Tablet By Mouth Every 8 Hours As Needed 15)  Trazodone Hcl 50 Mg Tabs (Trazodone Hcl) .... Take 1/4 Tablet By Mouth At Bedtime 16)  Imitrex 50 Mg Tabs (Sumatriptan Succinate) .... Take 1 Tablet By Mouth At Onset of Headache As Directed 17)  Prilosec 20 Mg Cpdr (Omeprazole) .... Take 2 Capsules By Mouth Once A Day 18)  Hyomax-Sl 0.125 Mg  Subl (Hyoscyamine Sulfate) .... Dissolve 1 To 2 Tablets Under The Tongue (Or By Mouth) Every 4 Hours As Needed For Chest Pain. 19)  Calcium Carbonate-Vitamin D 600-400 Mg-Unit  Tabs (Calcium Carbonate-Vitamin D) .... Take 1 Tablet By Mouth Once A Day 20)  Colace 100 Mg Caps (Docusate Sodium) .... Take 2 Capsules By Mouth Once A Day  Allergies: 1)  ! Penicillin V  Potassium (Penicillin V Potassium) 2)  ! Nsaids 3)  ! Voltaren  Past History:  Past Medical History: Last updated: 02/27/2009 TIA: evaluated in 2007 with MRI, had changes: ischemic versus demyelinating Anxiety GERD Hypertension Hypothyroidism Depression Allergic rhinitis Fibromyalgia Back pain Hemorrhoids Migaine  Past Surgical History: Last updated: 02/07/2008 Cholecystectomy Hysterectomy-1984. Breast Surgery Normal cardiac catheterization  Family History: Last updated: 01/16/2008 Grandmother had breast cancer. No colon cancer. Three uncles had lung cancer, all smoked. Sister died of leukemia at age 26 months. No ovarian cancer. Family History Diabetes 1st degree relative, mother and father. Father had CAD in his late 67s.  Social History: Last updated: 11/05/2009 Married. Alcohol use-no Drug use-no Regular exercise-no  Risk Factors: Alcohol Use: 0 (11/05/2009) Exercise: no (11/05/2009)  Risk Factors: Smoking Status: never (11/05/2009)  Family History: Reviewed history from 01/16/2008 and no changes required. Grandmother had breast cancer. No colon cancer. Three uncles had lung cancer, all smoked. Sister died of leukemia at age 74 months. No ovarian cancer. Family History Diabetes 1st degree relative, mother and father. Father had CAD in his late 76s.  Social History: Reviewed history from 11/20/2008 and no changes required. Married. Alcohol use-no Drug use-no Regular exercise-no Drug Use:  no Does Patient Exercise:  no  Review of Systems       per HPI  Physical Exam  General:  Well-developed,well-nourished,in no acute distress; alert,appropriate and cooperative throughout examination Lungs:  Normal respiratory effort, chest expands symmetrically. Lungs are clear to auscultation, no crackles or wheezes. Heart:  Normal rate and regular rhythm. S1 and S2 normal without gallop, murmur, click, rub or other extra sounds. Abdomen:  soft, mild  RUQ tenderness, and normal bowel sounds.   Neurologic:  No cranial nerve deficits noted. Station and gait are normal. Plantar reflexes are down-going bilaterally. DTRs are symmetrical throughout. Sensory, motor and coordinative functions appear intact. Psych:  Oriented X3, memory intact for recent and remote, and slightly anxious.     Impression & Recommendations:  Problem # 1:  HYPERTENSION (ICD-401.9) Somewhat higher than the taget and I suspect this to be secondary to her  pain. I have repeated the measurement and the BP was 128/86 mmHg. I will not make any changes to her regimen today but will reasses on her next appointment and will readjust the regimen as indicated.  Her updated medication list for this problem includes:    Taztia Xt 240 Mg Xr24h-cap (Diltiazem hcl er beads) .Marland Kitchen... Take 1 tablet by mouth once a day    Enalapril Maleate 10 Mg Tabs (Enalapril maleate) .Marland Kitchen... Take 1 tablet by mouth once a day  BP today: 131/80 Prior BP: 119/82 (10/26/2009)  Labs Reviewed: K+: 4.4 (10/26/2009) Creat: : 0.69 (10/26/2009)   Chol: 175 (01/27/2009)   HDL: 66 (01/27/2009)   LDL: 87 (01/27/2009)   TG: 109 (01/27/2009)  Problem # 2:  CONSTIPATION (ICD-564.00) Likely causing this pain since I can not really find out exact etiology of this pain. Her most recent CT of the abd/pelvis with and without contrast was not suggestive of any acute findigs and certainly nothing that could explain this pain. Her colonoscopy is negative and barrium swallow as well. She has been following with GI specialist who recommends bowel regimen. I have encouraged her to follow this but I have also mentioned that her pain medications can worsen constipation. I have suggested enema and suppositories as needed for the same purpose with plenty of fluids with minimum of 30 grams of fiber daily. I will follow up on her symptoms and will see her in clinic in 1 month.  Will check TSH today since has not been checked in several months.   Her updated medication list for this problem includes:    Metamucil 30.9 % Powd (Psyllium) .Marland Kitchen... Take 1 teaspoonful in 8 ounces of water or juice 1-2 times daily.    Colace 100 Mg Caps (Docusate sodium) .Marland Kitchen... Take 2 capsules by mouth once a day  Problem # 3:  HYPERCHOLESTEROLEMIA (ICD-272.0) I would recommend checking FLP on her next visit since she did not want to have it done today adn will readjust the regimen as indicated.  Her updated medication list for this problem includes:    Pravachol 20 Mg Tabs (Pravastatin sodium) .Marland Kitchen... Take one tablet daily to lower cholesterol.  Labs Reviewed: SGOT: 9 (10/07/2009)   SGPT: 10 (10/07/2009)   HDL:66 (01/27/2009), 56 (05/20/2008)  LDL:87 (01/27/2009), 106 (93/71/6967)  Chol:175 (01/27/2009), 204 (05/20/2008)  Trig:109 (01/27/2009), 210 (05/20/2008)  Problem # 4:  HYPOTHYROIDISM (ICD-244.9) Check TSH today and readjust the regimen as indicated.  Her updated medication list for this problem includes:    Levothyroxine Sodium 88 Mcg Tabs (Levothyroxine sodium) .Marland Kitchen... Take 1 tablet by mouth once a day  Orders: T-TSH ([id number])  Problem # 5:  DIABETES MELLITUS, TYPE II (ICD-250.00) Periodic monitoring of A1C recommended with diet and excercise strongly encouraged.  Her updated medication list for this problem includes:    Enalapril Maleate 10 Mg Tabs (Enalapril maleate) .Marland Kitchen... Take 1 tablet by mouth once a day    Aspirin 325 Mg Tabs (Aspirin) .Marland Kitchen... Take one tablet daily.  Labs Reviewed: Creat: 0.69 (10/26/2009)     Last Eye Exam: Mild background diabetic retinopathy OS and OD. Exam at [facility].  (09/03/2009) Reviewed HgBA1c results: 6.0 (10/07/2009)  5.7 (06/02/2009)  Complete Medication List: 1)  Lyrica 150 Mg Caps (Pregabalin) .... Take 1 capsule by mouth two times a day 2)  Effexor Xr 150 Mg Cp24 (Venlafaxine hcl) .... Take 1 capsule by mouth once a day 3)  Klonopin 0.5 Mg Tabs (Clonazepam) .... Take 1 tablet  by mouth  once a day as needed 4)  Pravachol 20 Mg Tabs (Pravastatin sodium) .... Take one tablet daily to lower cholesterol. 5)  Taztia Xt 240 Mg Xr24h-cap (Diltiazem hcl er beads) .... Take 1 tablet by mouth once a day 6)  Enalapril Maleate 10 Mg Tabs (Enalapril maleate) .... Take 1 tablet by mouth once a day 7)  Levothyroxine Sodium 88 Mcg Tabs (Levothyroxine sodium) .... Take 1 tablet by mouth once a day 8)  Proventil Hfa 108 (90 Base) Mcg/act Aers (Albuterol sulfate) .... Inhale 2 puffs four times a day as needed 9)  Aspirin 325 Mg Tabs (Aspirin) .... Take one tablet daily. 10)  Oxycodone-acetaminophen 5-500 Mg Caps (Oxycodone-acetaminophen) .... Take 1 tablet by mouth every 6 hours as needed for pain 11)  Flonase 50 Mcg/act Susp (Fluticasone propionate) .... Take 2 sprays in each nostril once daily 12)  Metamucil 30.9 % Powd (Psyllium) .... Take 1 teaspoonful in 8 ounces of water or juice 1-2 times daily. 13)  Naproxen 500 Mg Tabs (Naproxen) .... Take 1 tablet by mouth two times a day after a meal 14)  Cyclobenzaprine Hcl 5 Mg Tabs (Cyclobenzaprine hcl) .... Take 1 tablet by mouth every 8 hours as needed 15)  Trazodone Hcl 50 Mg Tabs (Trazodone hcl) .... Take 1/4 tablet by mouth at bedtime 16)  Imitrex 50 Mg Tabs (Sumatriptan succinate) .... Take 1 tablet by mouth at onset of headache as directed 17)  Prilosec 20 Mg Cpdr (Omeprazole) .... Take 2 capsules by mouth once a day 18)  Hyomax-sl 0.125 Mg Subl (Hyoscyamine sulfate) .... Dissolve 1 to 2 tablets under the tongue (or by mouth) every 4 hours as needed for chest pain. 19)  Calcium Carbonate-vitamin D 600-400 Mg-unit Tabs (Calcium carbonate-vitamin d) .... Take 1 tablet by mouth once a day 20)  Colace 100 Mg Caps (Docusate sodium) .... Take 2 capsules by mouth once a day  Patient Instructions: 1)  Please schedule a follow-up appointment in 1 month. 2)  Please check your blood pressure regularly, if it is >170 please call clinic at [phone] 3)   It is important that you exercise regularly at least 20 minutes 5 times a week. If you develop chest pain, have severe difficulty breathing, or feel very tired , stop exercising immediately and seek medical attention. Prescriptions: EFFEXOR XR 150 MG CP24 (VENLAFAXINE HCL) Take 1 capsule by mouth once a day  #30 x 3   Entered and Authorized by:   Mliss Sax MD   Signed by:   Mliss Sax MD on 11/05/2009   Method used:   Electronically to        [facility]  [phone]* (retail)       [address]       [place], [place]  [address]       Ph: [phone] or [phone]       Fax: [phone]   RxID:   [id number]  Process Orders Check Orders Results:     [facility]: ABN not required for this insurance Order queued for requisitioning for [facility]: November 05, 2009 3:06 PM  Tests Sent for requisitioning (November 05, 2009 3:06 PM):     11/05/2009: [facility] -- T-TSH [id number] (signed)    Prevention & Chronic Care Immunizations   Influenza vaccine: Historical  (04/10/2009)   Influenza vaccine deferral: Not indicated  (11/05/2009)   Influenza vaccine due: 03/11/2010    Tetanus booster:  06/30/2004: Historical   Tetanus booster due: 06/30/2014    Pneumococcal vaccine: Pneumovax (Medicare)  (05/14/2008)    H. zoster vaccine: Not documented   H. zoster vaccine deferral: Not indicated  (11/05/2009)  Colorectal Screening   Hemoccult: negative x 3  (02/25/2009)   Hemoccult action/deferral: Ordered  (10/26/2009)   Hemoccult due: 03/2010    Colonoscopy: Results: Normal. Procedure performed by Dr. Yancey Flemings.  (02/14/2005)   Colonoscopy due: 02/15/2015  Other Screening   Pap smear: Not documented   Pap smear action/deferral: Not indicated S/P hysterectomy  (01/27/2009)    Mammogram: ASSESSMENT: Negative - BI-RADS 1^MM DIGITAL SCREENING  (11/10/2008)   Mammogram due: 11/10/2009    DXA bone density scan:  Not documented   Smoking status: never  (11/05/2009)  Diabetes Mellitus   HgbA1C: 6.0  (10/07/2009)   HgbA1C action/deferral: Ordered  (06/02/2009)   Hemoglobin A1C due: 01/07/2010    Eye exam: Mild background diabetic retinopathy OS and OD. Exam at [facility].   (09/03/2009)   Diabetic eye exam action/deferral: Ophthalmology referral  (07/29/2009)   Eye exam due: 09/2010    Foot exam: yes  (01/27/2009)   Foot exam action/deferral: Do today   High risk foot: No  (01/27/2009)   Foot care education: Done  (01/27/2009)   Foot exam due: 01/27/2010    Urine microalbumin/creatinine ratio: 10.3  (01/27/2009)   Urine microalbumin action/deferral: Ordered   Urine microalbumin/cr due: 01/27/2010    Diabetes flowsheet reviewed?: Yes   Progress toward A1C goal: At goal  Lipids   Total Cholesterol: 175  (01/27/2009)   Lipid panel action/deferral: Lipid Panel ordered   LDL: 87  (01/27/2009)   LDL Direct: Not documented   HDL: 66  (01/27/2009)   Triglycerides: 109  (01/27/2009)   Lipid panel due: 01/27/2010    SGOT (AST): 9  (10/07/2009)   BMP action: Ordered   SGPT (ALT): 10  (10/07/2009)   Alkaline phosphatase: 96  (10/07/2009)   Total bilirubin: 0.3  (10/07/2009)    Lipid flowsheet reviewed?: Yes   Progress toward LDL goal: Improved  Hypertension   Last Blood Pressure: 131 / 80  (11/05/2009)   Serum creatinine: 0.69  (10/26/2009)   BMP action: Ordered   Serum potassium 4.4  (10/26/2009)   Basic metabolic panel due: 04/09/2010    Hypertension flowsheet reviewed?: Yes   Progress toward BP goal: At goal  Self-Management Support :   Personal Goals (by the next clinic visit) :     Personal A1C goal: 7  (01/27/2009)     Personal blood pressure goal: 130/80  (01/27/2009)     Personal LDL goal: 100  (01/27/2009)    Patient will work on the following items until the next clinic visit to reach self-care goals:     Medications and monitoring: take my medicines  every day, check my blood sugar, check my blood pressure, bring all of my medications to every visit, weigh myself weekly, examine my feet every day  (11/05/2009)     Eating: eat more vegetables, use fresh or frozen vegetables, eat foods that are low in salt, eat fruit for snacks and desserts, limit or avoid alcohol  (11/05/2009)     Activity: take a 30 minute walk every day  (11/05/2009)     Home glucose monitoring frequency: 1 time daily  (01/27/2009)    Diabetes self-management support: Written self-care plan, Education handout, Resources for patients handout  (11/05/2009)   Diabetes care plan printed   Diabetes education  handout printed    Diabetes self-management support not done because: Good outcomes  (01/27/2009)    Hypertension self-management support: Written self-care plan, Education handout, Resources for patients handout  (11/05/2009)   Hypertension self-care plan printed.   Hypertension education handout printed    Lipid self-management support: Written self-care plan, Education handout, Resources for patients handout  (11/05/2009)   Lipid self-care plan printed.   Lipid education handout printed      Resource handout printed.

## 2010-08-12 NOTE — Progress Notes (Signed)
Summary: appt/ hla  Phone Note Call from Patient   Caller: Patient Summary of Call: dental pain for several months, getting worse, referral done, just waiting for appt, nothing helps, eating makes worse. cramps in feet just recently started, repositioning helps cramps, but sometimes movement makes worse. Initial call taken by: Marin Roberts RN,  February 05, 2010 2:53 PM     Appended Document: appt/ hla Appointment has been made on Monday with Dr. Denton Meek.

## 2010-08-12 NOTE — Assessment & Plan Note (Signed)
Summary: rt side pain/gg   Vital Signs:  Patient profile:   65 year old female Height:      67 inches (170.18 cm) Weight:      186.0 pounds (84.55 kg) BMI:     29.24 Temp:     97.0 degrees F (36.11 degrees C) oral Pulse rate:   80 / minute BP sitting:   119 / 82  (right arm) Cuff size:   large  Vitals Entered By: Cynda Familia Duncan Dull) (October 26, 2009 4:08 PM) CC: PT C/O RIGHT SIDE LOWER ABD PAIN-CONSTANT X Pain Assessment Patient in pain? yes     Location: right side lower abd Intensity: 6 Type: dull Onset of pain  Constant x Nutritional Status BMI of > 30 = obese  Does patient need assistance? Functional Status Self care Ambulation Impaired:Risk for fall Comments CNAE   Primary Care Provider:  Margarito Liner MD  CC:  PT C/O RIGHT SIDE LOWER ABD PAIN-CONSTANT X .  History of Present Illness: 65 yo f with Past Medical History:   TIA: evaluated in 2007 with MRI, had changes: ischemic versus demyelinating Anxiety GERD Hypertension Hypothyroidism Depression Allergic rhinitis Fibromyalgia Back pain Hemorrhoids Migaine s/p cholecystectomy.   P[facility] to [facility] with CC of abdominal pain x 3 weeks.   the pain started 3 weeks , located at RLQ ,is about 5-8/10 intesity, dull in quality, radiates nowhere,  it has been getting worse over the past 3 weeks, it is brought on by heavy food,   it usually lasts for 30 minutes, comes and goes, occurs about 5-6 times a day, it is alleviated by ice pack, associated with nausea, narrow stool. patient had colonoscopy 4 years ago which was WNL.   Patient had colonoscopy 4 years ago which was WNL at time however Patient also c/o dark stools and pencile like stool caliber, also patients Hg has been dropping over past 2 years from hg 13 to 11.  Patient currently denies SOB, Denies CP, Denies fever, chills, nausea, vomiting, diarrhea, constipation and otherwise doing well and denies any other complaints.             Current Medications (verified): 1)  Taztia Xt 240 Mg Xr24h-Cap (Diltiazem Hcl Er Beads) .... Take 1 Tablet By Mouth Once A Day 2)  Klonopin 0.5 Mg Tabs (Clonazepam) .... Take 1 Tablet By Mouth Once A Day As Needed 3)  Enalapril Maleate 10 Mg Tabs (Enalapril Maleate) .... Take 1 Tablet By Mouth Once A Day 4)  Levothyroxine Sodium 88 Mcg Tabs (Levothyroxine Sodium) .... Take 1 Tablet By Mouth Once A Day 5)  Imitrex 50 Mg Tabs (Sumatriptan Succinate) .... Take 1 Tablet By Mouth At Onset of Headache As Directed 6)  Lyrica 150 Mg Caps (Pregabalin) .... Take 1 Capsule By Mouth Four Times A Day 7)  Colace 100 Mg Caps (Docusate Sodium) .... Take 2 Capsules By Mouth Once A Day 8)  Prilosec 20 Mg Cpdr (Omeprazole) .... Take 2 Capsules By Mouth Once A Day 9)  Proventil Hfa 108 (90 Base) Mcg/act Aers (Albuterol Sulfate) .... Inhale 2 Puffs Four Times A Day As Needed 10)  Effexor Xr 150 Mg Cp24 (Venlafaxine Hcl) .... Take 2 Capsules By Mouth Once A Day 11)  Aspirin 325 Mg Tabs (Aspirin) .... Take One Tablet Daily. 12)  Oxycodone-Acetaminophen 5-500 Mg Caps (Oxycodone-Acetaminophen) .... Take 1 Tablet By Mouth Every 6 Hours As Needed For Pain 13)  Calcium Carbonate-Vitamin D 600-400 Mg-Unit  Tabs (Calcium Carbonate-Vitamin D) .Marland KitchenMarland KitchenMarland Kitchen  Take 1 Tablet By Mouth Once A Day 14)  Flonase 50 Mcg/act  Susp (Fluticasone Propionate) .... Take 2 Sprays in Each Nostril Once Daily 15)  Hyomax-Sl 0.125 Mg  Subl (Hyoscyamine Sulfate) .... Dissolve 1 To 2 Tablets Under The Tongue (Or By Mouth) Every 4 Hours As Needed For Chest Pain. 16)  Pravachol 20 Mg Tabs (Pravastatin Sodium) .... Take One Tablet Daily To Lower Cholesterol. 17)  Metamucil 30.9 % Powd (Psyllium) .... Take 1 Teaspoonful in 8 Ounces of Water or Juice 1-2 Times Daily. 18)  Naproxen 500 Mg Tabs (Naproxen) .... Take 1 Tablet By Mouth Two Times A Day After A Meal 19)  Cyclobenzaprine Hcl 5 Mg Tabs (Cyclobenzaprine Hcl) .... Take 1 Tablet By Mouth Every 8 Hours As  Needed 20)  Trazodone Hcl 50 Mg Tabs (Trazodone Hcl) .... Take 1/4 Tablet By Mouth At Bedtime  Allergies: 1)  ! Penicillin V Potassium (Penicillin V Potassium) 2)  ! Nsaids 3)  ! Voltaren  Review of Systems       Per HPI  Physical Exam  General:  alert, no distress Mouth:  pharynx pink and moist, no erythema, and no exudates.   Neck:  supple.   Lungs:  normal respiratory effort, normal breath sounds, no crackles, and no wheezes.   Heart:  normal rate, regular rhythm, no murmur, no gallop, and no rub.   Abdomen:  soft, non-tender, and normal bowel sounds.   Msk:  No deformity or scoliosis noted of thoracic or lumbar spine.   Pulses:  normal peripheral pulses  Extremities:  no edema Neurologic:  alert & oriented X3, cranial nerves II-XII intact, strength normal in all extremities, and gait normal.   Skin:  Intact without suspicious lesions or rashes Psych:  normally interactive.     Impression & Recommendations:  Problem # 1:  ? of ABDOMINAL MASS/RLQ PAIN (ICD-789.30) Assessment New the abdominal pain started 3 weeks , located at RLQ ,is about 5-8/10 intesity, dull in quality, radiates nowhere,  it has been getting worse over the past 3 weeks, it is brought on by heavy food,   it usually lasts for 30 minutes, comes and goes, occurs about 5-6 times a day, it is alleviated by ice pack, associated with nausea, narrow stool. patient had colonoscopy 4 years ago which was WNL.  Patient had colonoscopy 4 years ago which was WNL at time. Patient also c.o dark stools and pencile like stool caliber, also note that patients Hg has been dropping over past 2 years from hg 13 to 11. ddx: appendicitis vs colon stricture vs malignancy vs renal stone. Plan: iron studies and CT abdommen, if negative consider GI referral for repeat colonoscopy. f/u in one week.  Orders: T-Iron Binding Capacity (TIBC) ([id number]) Augusto Gamble [phone]) T-Ferritin [phone]) T-CBC No Diff ([id number]) T-Basic  Metabolic Panel ([id number]) T-Urinalysis ([id number]) CT with & without Contrast (CT w&w/o Contrast)  Problem # 2:  DIABETES MELLITUS, TYPE II (ICD-250.00) Assessment: Comment Only Well controlled on current treatment, No new changes made today, Will continue to monitor.   Her updated medication list for this problem includes:    Enalapril Maleate 10 Mg Tabs (Enalapril maleate) .Marland Kitchen... Take 1 tablet by mouth once a day    Aspirin 325 Mg Tabs (Aspirin) .Marland Kitchen... Take one tablet daily.  Labs Reviewed: Creat: 0.76 (10/07/2009)     Last Eye Exam: Mild background diabetic retinopathy OS and OD. Exam at [facility].  (09/03/2009) Reviewed HgBA1c results: 6.0 (10/07/2009)  5.7 (06/02/2009)  Problem # 3:  HYPERTENSION (ICD-401.9) Assessment: Comment Only Well controlled on current treatment, No new changes made today, Will continue to monitor.   Her updated medication list for this problem includes:    Taztia Xt 240 Mg Xr24h-cap (Diltiazem hcl er beads) .Marland Kitchen... Take 1 tablet by mouth once a day    Enalapril Maleate 10 Mg Tabs (Enalapril maleate) .Marland Kitchen... Take 1 tablet by mouth once a day  BP today: 119/82 Prior BP: 124/75 (10/07/2009)  Labs Reviewed: K+: 4.0 (10/07/2009) Creat: : 0.76 (10/07/2009)   Chol: 175 (01/27/2009)   HDL: 66 (01/27/2009)   LDL: 87 (01/27/2009)   TG: 109 (01/27/2009)  Problem # 4:  HYPERCHOLESTEROLEMIA (ICD-272.0) Assessment: Comment Only Well controlled on current treatment, No new changes made today, Will continue to monitor.   Her updated medication list for this problem includes:    Pravachol 20 Mg Tabs (Pravastatin sodium) .Marland Kitchen... Take one tablet daily to lower cholesterol.  Labs Reviewed: SGOT: 9 (10/07/2009)   SGPT: 10 (10/07/2009)   HDL:66 (01/27/2009), 56 (05/20/2008)  LDL:87 (01/27/2009), 106 (16/04/9603)  Chol:175 (01/27/2009), 204 (05/20/2008)  Trig:109 (01/27/2009), 210 (05/20/2008)  Complete Medication List: 1)  Taztia Xt 240 Mg Xr24h-cap  (Diltiazem hcl er beads) .... Take 1 tablet by mouth once a day 2)  Klonopin 0.5 Mg Tabs (Clonazepam) .... Take 1 tablet by mouth once a day as needed 3)  Enalapril Maleate 10 Mg Tabs (Enalapril maleate) .... Take 1 tablet by mouth once a day 4)  Levothyroxine Sodium 88 Mcg Tabs (Levothyroxine sodium) .... Take 1 tablet by mouth once a day 5)  Imitrex 50 Mg Tabs (Sumatriptan succinate) .... Take 1 tablet by mouth at onset of headache as directed 6)  Lyrica 150 Mg Caps (Pregabalin) .... Take 1 capsule by mouth four times a day 7)  Colace 100 Mg Caps (Docusate sodium) .... Take 2 capsules by mouth once a day 8)  Prilosec 20 Mg Cpdr (Omeprazole) .... Take 2 capsules by mouth once a day 9)  Proventil Hfa 108 (90 Base) Mcg/act Aers (Albuterol sulfate) .... Inhale 2 puffs four times a day as needed 10)  Effexor Xr 150 Mg Cp24 (Venlafaxine hcl) .... Take 2 capsules by mouth once a day 11)  Aspirin 325 Mg Tabs (Aspirin) .... Take one tablet daily. 12)  Oxycodone-acetaminophen 5-500 Mg Caps (Oxycodone-acetaminophen) .... Take 1 tablet by mouth every 6 hours as needed for pain 13)  Calcium Carbonate-vitamin D 600-400 Mg-unit Tabs (Calcium carbonate-vitamin d) .... Take 1 tablet by mouth once a day 14)  Flonase 50 Mcg/act Susp (Fluticasone propionate) .... Take 2 sprays in each nostril once daily 15)  Hyomax-sl 0.125 Mg Subl (Hyoscyamine sulfate) .... Dissolve 1 to 2 tablets under the tongue (or by mouth) every 4 hours as needed for chest pain. 16)  Pravachol 20 Mg Tabs (Pravastatin sodium) .... Take one tablet daily to lower cholesterol. 17)  Metamucil 30.9 % Powd (Psyllium) .... Take 1 teaspoonful in 8 ounces of water or juice 1-2 times daily. 18)  Naproxen 500 Mg Tabs (Naproxen) .... Take 1 tablet by mouth two times a day after a meal 19)  Cyclobenzaprine Hcl 5 Mg Tabs (Cyclobenzaprine hcl) .... Take 1 tablet by mouth every 8 hours as needed 20)  Trazodone Hcl 50 Mg Tabs (Trazodone hcl) .... Take 1/4  tablet by mouth at bedtime  Other Orders: T-Hemoccult Card-Multiple (take home) ([id number])  Patient Instructions: 1)  Please schedule a follow-up appointment in 1 week.   Prevention & Chronic  Care Immunizations   Influenza vaccine: Historical  (04/10/2009)   Influenza vaccine deferral: Not available  (01/27/2009)   Influenza vaccine due: 03/11/2010    Tetanus booster: 06/30/2004: Historical   Tetanus booster due: 06/30/2014    Pneumococcal vaccine: Pneumovax (Medicare)  (05/14/2008)    H. zoster vaccine: Not documented   H. zoster vaccine deferral: Deferred  (01/27/2009)  Colorectal Screening   Hemoccult: negative x 3  (02/25/2009)   Hemoccult action/deferral: Ordered  (10/26/2009)   Hemoccult due: 03/2010    Colonoscopy: Results: Normal. Procedure performed by Dr. Yancey Flemings.  (02/14/2005)   Colonoscopy due: 02/15/2015  Other Screening   Pap smear: Not documented   Pap smear action/deferral: Not indicated S/P hysterectomy  (01/27/2009)    Mammogram: ASSESSMENT: Negative - BI-RADS 1^MM DIGITAL SCREENING  (11/10/2008)   Mammogram due: 11/10/2009    DXA bone density scan: Not documented   Smoking status: never  (10/07/2009)  Diabetes Mellitus   HgbA1C: 6.0  (10/07/2009)   HgbA1C action/deferral: Ordered  (06/02/2009)   Hemoglobin A1C due: 01/07/2010    Eye exam: Mild background diabetic retinopathy OS and OD. Exam at [facility].   (09/03/2009)   Diabetic eye exam action/deferral: Ophthalmology referral  (07/29/2009)   Eye exam due: 09/2010    Foot exam: yes  (01/27/2009)   Foot exam action/deferral: Do today   High risk foot: No  (01/27/2009)   Foot care education: Done  (01/27/2009)   Foot exam due: 01/27/2010    Urine microalbumin/creatinine ratio: 10.3  (01/27/2009)   Urine microalbumin action/deferral: Ordered   Urine microalbumin/cr due: 01/27/2010    Diabetes flowsheet reviewed?: Yes   Progress toward A1C goal: At goal  Lipids   Total  Cholesterol: 175  (01/27/2009)   Lipid panel action/deferral: Lipid Panel ordered   LDL: 87  (01/27/2009)   LDL Direct: Not documented   HDL: 66  (01/27/2009)   Triglycerides: 109  (01/27/2009)   Lipid panel due: 01/27/2010    SGOT (AST): 9  (10/07/2009)   BMP action: Ordered   SGPT (ALT): 10  (10/07/2009)   Alkaline phosphatase: 96  (10/07/2009)   Total bilirubin: 0.3  (10/07/2009)    Lipid flowsheet reviewed?: Yes   Progress toward LDL goal: At goal  Hypertension   Last Blood Pressure: 119 / 82  (10/26/2009)   Serum creatinine: 0.76  (10/07/2009)   BMP action: Ordered   Serum potassium 4.0  (10/07/2009)   Basic metabolic panel due: 04/09/2010    Hypertension flowsheet reviewed?: Yes   Progress toward BP goal: At goal  Self-Management Support :   Personal Goals (by the next clinic visit) :     Personal A1C goal: 7  (01/27/2009)     Personal blood pressure goal: 130/80  (01/27/2009)     Personal LDL goal: 100  (01/27/2009)     Home glucose monitoring frequency: 1 time daily  (01/27/2009)    Diabetes self-management support: Written self-care plan  (10/26/2009)   Diabetes care plan printed    Diabetes self-management support not done because: Good outcomes  (01/27/2009)    Hypertension self-management support: Written self-care plan  (10/26/2009)   Hypertension self-care plan printed.    Lipid self-management support: Written self-care plan  (10/26/2009)   Lipid self-care plan printed.   Nursing Instructions: Provide Hemoccult cards with instructions (see order)   Process Orders Check Orders Results:     [facility]: ABN not required for this insurance Tests Sent for requisitioning (October 26, 2009  5:09 PM):     10/26/2009: [facility] -- T-Iron Binding Capacity (TIBC) [[id number]] (signed)     10/26/2009: [facility] -- Augusto Gamble [[id number]] (signed)     10/26/2009: [facility] -- T-Ferritin  [[id number]] (signed)     10/26/2009: [facility] -- T-CBC No Diff [[id number]] (signed)     10/26/2009: [facility] -- T-Basic Metabolic Panel [phone] (signed)     10/26/2009: [facility] -- T-Urinalysis [[id number]] (signed)

## 2010-08-12 NOTE — Assessment & Plan Note (Signed)
Summary: problem with med change/gg   Vital Signs:  Patient profile:   64 year old female Height:      67 inches (170.18 cm) Weight:      186.7 pounds (84.86 kg) BMI:     29.35 Temp:     97.9 degrees F oral Pulse rate:   73 / minute BP sitting:   135 / 81  (left arm)  Vitals Entered By: Chinita Pester RN (Nov 26, 2009 1:31 PM) CC: Experiencing "off balance", "fogginess in my head" and sleepy.  Also was biten by a tick. Is Patient Diabetic? Yes Did you bring your meter with you today? No Pain Assessment Patient in pain? yes     Location: back Intensity: 6 Type: aching Onset of pain  Chronic Nutritional Status BMI of 25 - 29 = overweight CBG Result 95  Have you ever been in a relationship where you felt threatened, hurt or afraid?No   Does patient need assistance? Functional Status Self care Ambulation Normal   Primary Care Provider:  Margarito Liner MD  CC:  Experiencing "off balance" and "fogginess in my head" and sleepy.  Also was biten by a tick..  History of Present Illness: 65 yo female with PMH outlined below presents to [facility] for regular follow up appointment. She has no concerns at the time. No recent sicknesses or hospitalizaitons. No episodes of chest pain, SOB, palpitations. No specific abdominal or urinary concerns. No recent changes in appetite, weight, sleep patterns, mood. She tells me she has regular bowel movements which she has not had in very long time and she is quite happy with our recommendation on use of flaxseed. She also tells me that her abdominal pain is resolved.    Depression History:      The patient denies a depressed mood most of the day and a diminished interest in her usual daily activities.  The patient denies significant weight loss, significant weight gain, insomnia, hypersomnia, psychomotor agitation, psychomotor retardation, fatigue (loss of energy), feelings of worthlessness (guilt), impaired concentration (indecisiveness), and recurrent  thoughts of death or suicide.        The patient denies that she feels like life is not worth living, denies that she wishes that she were dead, and denies that she has thought about ending her life.         Preventive Screening-Counseling & Management  Alcohol-Tobacco     Alcohol drinks/day: 0     Smoking Status: never  Caffeine-Diet-Exercise     Does Patient Exercise: no  Problems Prior to Update: 1)  Diabetes Mellitus, Type II  (ICD-250.00) 2)  Hypertension  (ICD-401.9) 3)  Hypercholesterolemia  (ICD-272.0) 4)  Hypothyroidism  (ICD-244.9) 5)  Urinary Frequency  (ICD-788.41) 6)  ? of Abdominal Mass  (ICD-789.30) 7)  Tia  (ICD-435.9) 8)  Uti  (ICD-599.0) 9)  Depression  (ICD-311) 10)  Anxiety  (ICD-300.00) 11)  Gerd  (ICD-530.81) 12)  Chest Pain  (ICD-786.50) 13)  Hiatal Hernia  (ICD-553.3) 14)  Dysphagia Unspecified  (ICD-787.20) 15)  Hot Flashes  (ICD-627.2) 16)  Leg Pain, Right  (ICD-729.5) 17)  Fibromyalgia  (ICD-729.1) 18)  Fatigue  (ICD-780.79) 19)  Alkaline Phosphatase, Elevated  (ICD-790.5) 20)  Shoulder Pain, Left  (ICD-719.41) 21)  Allergic Rhinitis  (ICD-477.9) 22)  Common Migraine  (ICD-346.10) 23)  Back Pain  (ICD-724.5) 24)  ? of Glucocorticoid Deficiency  (ICD-255.41) 25)  Constipation  (ICD-564.00) 26)  Family History Diabetes 1st Degree Relative  (ICD-V18.0)  Medications Prior  to Update: 1)  Lyrica 150 Mg Caps (Pregabalin) .... Take 1 Capsule By Mouth Two Times A Day 2)  Effexor Xr 150 Mg Cp24 (Venlafaxine Hcl) .... Take 1 Capsule By Mouth Once A Day 3)  Klonopin 0.5 Mg Tabs (Clonazepam) .... Take 1 Tablet By Mouth Once A Day As Needed 4)  Pravachol 20 Mg Tabs (Pravastatin Sodium) .... Take One Tablet Daily To Lower Cholesterol. 5)  Taztia Xt 240 Mg Xr24h-Cap (Diltiazem Hcl Er Beads) .... Take 1 Tablet By Mouth Once A Day 6)  Enalapril Maleate 10 Mg Tabs (Enalapril Maleate) .... Take 1 Tablet By Mouth Once A Day 7)  Levothyroxine Sodium 88 Mcg Tabs  (Levothyroxine Sodium) .... Take 1 Tablet By Mouth Once A Day 8)  Proventil Hfa 108 (90 Base) Mcg/act Aers (Albuterol Sulfate) .... Inhale 2 Puffs Four Times A Day As Needed 9)  Aspirin 325 Mg Tabs (Aspirin) .... Take One Tablet Daily. 10)  Oxycodone-Acetaminophen 5-500 Mg Caps (Oxycodone-Acetaminophen) .... Take 1 Tablet By Mouth Every 6 Hours As Needed For Pain 11)  Flonase 50 Mcg/act  Susp (Fluticasone Propionate) .... Take 2 Sprays in Each Nostril Once Daily 12)  Metamucil 30.9 % Powd (Psyllium) .... Take 1 Teaspoonful in 8 Ounces of Water or Juice 1-2 Times Daily. 13)  Naproxen 500 Mg Tabs (Naproxen) .... Take 1 Tablet By Mouth Two Times A Day After A Meal 14)  Cyclobenzaprine Hcl 5 Mg Tabs (Cyclobenzaprine Hcl) .... Take 1 Tablet By Mouth Every 8 Hours As Needed 15)  Trazodone Hcl 50 Mg Tabs (Trazodone Hcl) .... Take 1/4 Tablet By Mouth At Bedtime 16)  Imitrex 50 Mg Tabs (Sumatriptan Succinate) .... Take 1 Tablet By Mouth At Onset of Headache As Directed 17)  Prilosec 20 Mg Cpdr (Omeprazole) .... Take 2 Capsules By Mouth Once A Day 18)  Hyomax-Sl 0.125 Mg  Subl (Hyoscyamine Sulfate) .... Dissolve 1 To 2 Tablets Under The Tongue (Or By Mouth) Every 4 Hours As Needed For Chest Pain. 19)  Calcium Carbonate-Vitamin D 600-400 Mg-Unit  Tabs (Calcium Carbonate-Vitamin D) .... Take 1 Tablet By Mouth Once A Day 20)  Colace 100 Mg Caps (Docusate Sodium) .... Take 2 Capsules By Mouth Once A Day  Current Medications (verified): 1)  Lyrica 150 Mg Caps (Pregabalin) .... Take 1 Capsule By Mouth Two Times A Day 2)  Effexor Xr 150 Mg Cp24 (Venlafaxine Hcl) .... Take 1 Capsule By Mouth Once A Day 3)  Klonopin 0.5 Mg Tabs (Clonazepam) .... Take 1 Tablet By Mouth Once A Day As Needed 4)  Pravachol 20 Mg Tabs (Pravastatin Sodium) .... Take One Tablet Daily To Lower Cholesterol. 5)  Taztia Xt 240 Mg Xr24h-Cap (Diltiazem Hcl Er Beads) .... Take 1 Tablet By Mouth Once A Day 6)  Enalapril Maleate 10 Mg Tabs  (Enalapril Maleate) .... Take 1 Tablet By Mouth Once A Day 7)  Levothyroxine Sodium 88 Mcg Tabs (Levothyroxine Sodium) .... Take 1 Tablet By Mouth Once A Day 8)  Proventil Hfa 108 (90 Base) Mcg/act Aers (Albuterol Sulfate) .... Inhale 2 Puffs Four Times A Day As Needed 9)  Aspirin 325 Mg Tabs (Aspirin) .... Take One Tablet Daily. 10)  Oxycodone-Acetaminophen 5-500 Mg Caps (Oxycodone-Acetaminophen) .... Take 1 Tablet By Mouth Every 6 Hours As Needed For Pain 11)  Flonase 50 Mcg/act  Susp (Fluticasone Propionate) .... Take 2 Sprays in Each Nostril Once Daily 12)  Metamucil 30.9 % Powd (Psyllium) .... Take 1 Teaspoonful in 8 Ounces of Water or Juice 1-2  Times Daily. 13)  Naproxen 500 Mg Tabs (Naproxen) .... Take 1 Tablet By Mouth Two Times A Day After A Meal 14)  Cyclobenzaprine Hcl 5 Mg Tabs (Cyclobenzaprine Hcl) .... Take 1 Tablet By Mouth Every 8 Hours As Needed 15)  Trazodone Hcl 50 Mg Tabs (Trazodone Hcl) .... Take 1/4 Tablet By Mouth At Bedtime 16)  Imitrex 50 Mg Tabs (Sumatriptan Succinate) .... Take 1 Tablet By Mouth At Onset of Headache As Directed 17)  Prilosec 20 Mg Cpdr (Omeprazole) .... Take 2 Capsules By Mouth Once A Day 18)  Hyomax-Sl 0.125 Mg  Subl (Hyoscyamine Sulfate) .... Dissolve 1 To 2 Tablets Under The Tongue (Or By Mouth) Every 4 Hours As Needed For Chest Pain. 19)  Calcium Carbonate-Vitamin D 600-400 Mg-Unit  Tabs (Calcium Carbonate-Vitamin D) .... Take 1 Tablet By Mouth Once A Day 20)  Colace 100 Mg Caps (Docusate Sodium) .... Take 2 Capsules By Mouth Once A Day 21)  Diphenhydramine-Zinc Acetate 2-0.1 % Crea (Diphenhydramine-Zinc Acetate) .... Apply To The Affected Area As Needed  Allergies (verified): 1)  ! Penicillin V Potassium (Penicillin V Potassium) 2)  ! Nsaids 3)  ! Voltaren  Past History:  Past Medical History: Last updated: 02/27/2009 TIA: evaluated in 2007 with MRI, had changes: ischemic versus  demyelinating Anxiety GERD Hypertension Hypothyroidism Depression Allergic rhinitis Fibromyalgia Back pain Hemorrhoids Migaine  Past Surgical History: Last updated: 02/07/2008 Cholecystectomy Hysterectomy-1984. Breast Surgery Normal cardiac catheterization  Family History: Last updated: 01/16/2008 Grandmother had breast cancer. No colon cancer. Three uncles had lung cancer, all smoked. Sister died of leukemia at age 73 months. No ovarian cancer. Family History Diabetes 1st degree relative, mother and father. Father had CAD in his late 71s.  Social History: Last updated: 11/05/2009 Married. Alcohol use-no Drug use-no Regular exercise-no  Risk Factors: Alcohol Use: 0 (11/26/2009) Exercise: no (11/26/2009)  Risk Factors: Smoking Status: never (11/26/2009)  Family History: Reviewed history from 01/16/2008 and no changes required. Grandmother had breast cancer. No colon cancer. Three uncles had lung cancer, all smoked. Sister died of leukemia at age 65 months. No ovarian cancer. Family History Diabetes 1st degree relative, mother and father. Father had CAD in his late 71s.  Social History: Reviewed history from 11/05/2009 and no changes required. Married. Alcohol use-no Drug use-no Regular exercise-no  Review of Systems       per HPI  Physical Exam  General:  Well-developed,well-nourished,in no acute distress; alert,appropriate and cooperative throughout examination Lungs:  Normal respiratory effort, chest expands symmetrically. Lungs are clear to auscultation, no crackles or wheezes. Heart:  Normal rate and regular rhythm. S1 and S2 normal without gallop, murmur, click, rub or other extra sounds. Abdomen:  soft, mild RUQ tenderness, and normal bowel sounds.   Neurologic:  No cranial nerve deficits noted. Station and gait are normal. Plantar reflexes are down-going bilaterally. DTRs are symmetrical throughout. Sensory, motor and coordinative functions  appear intact.   Impression & Recommendations:  Problem # 1:  HYPERTENSION (ICD-401.9) At goal, will continue to monitor. Her updated medication list for this problem includes:    Taztia Xt 240 Mg Xr24h-cap (Diltiazem hcl er beads) .Marland Kitchen... Take 1 tablet by mouth once a day    Enalapril Maleate 10 Mg Tabs (Enalapril maleate) .Marland Kitchen... Take 1 tablet by mouth once a day  BP today: 135/81 Prior BP: 131/80 (11/05/2009)  Labs Reviewed: K+: 4.4 (10/26/2009) Creat: : 0.69 (10/26/2009)   Chol: 175 (01/27/2009)   HDL: 66 (01/27/2009)   LDL: 87 (01/27/2009)  TG: 109 (01/27/2009)  Problem # 2:  DIABETES MELLITUS, TYPE II (ICD-250.00) Good control, off metformin and will cont to regulate with diet and exercise and cont to encourage both.  Her updated medication list for this problem includes:    Enalapril Maleate 10 Mg Tabs (Enalapril maleate) .Marland Kitchen... Take 1 tablet by mouth once a day    Aspirin 325 Mg Tabs (Aspirin) .Marland Kitchen... Take one tablet daily.  Orders: Capillary Blood Glucose/CBG [phone])  Labs Reviewed: Creat: 0.69 (10/26/2009)     Last Eye Exam: Mild background diabetic retinopathy OS and OD. Exam at [facility].  (09/03/2009) Reviewed HgBA1c results: 6.0 (10/07/2009)  5.7 (06/02/2009)  Problem # 3:  HYPERCHOLESTEROLEMIA (ICD-272.0) Improvement in FLP so will cont the same regimen for now.  Her updated medication list for this problem includes:    Pravachol 20 Mg Tabs (Pravastatin sodium) .Marland Kitchen... Take one tablet daily to lower cholesterol.  Labs Reviewed: SGOT: 9 (10/07/2009)   SGPT: 10 (10/07/2009)   HDL:66 (01/27/2009), 56 (05/20/2008)  LDL:87 (01/27/2009), 106 (60/45/4098)  Chol:175 (01/27/2009), 204 (05/20/2008)  Trig:109 (01/27/2009), 210 (05/20/2008)  Problem # 4:  HYPOTHYROIDISM (ICD-244.9)  Continue same regimen.  Her updated medication list for this problem includes:    Levothyroxine Sodium 88 Mcg Tabs (Levothyroxine sodium) .Marland Kitchen... Take 1 tablet by mouth once a  day  Labs Reviewed: TSH: 3.734 (11/05/2009)    HgBA1c: 6.0 (10/07/2009) Chol: 175 (01/27/2009)   HDL: 66 (01/27/2009)   LDL: 87 (01/27/2009)   TG: 109 (01/27/2009)  Complete Medication List: 1)  Lyrica 150 Mg Caps (Pregabalin) .... Take 1 capsule by mouth two times a day 2)  Effexor Xr 150 Mg Cp24 (Venlafaxine hcl) .... Take 1 capsule by mouth once a day 3)  Klonopin 0.5 Mg Tabs (Clonazepam) .... Take 1 tablet by mouth once a day as needed 4)  Pravachol 20 Mg Tabs (Pravastatin sodium) .... Take one tablet daily to lower cholesterol. 5)  Taztia Xt 240 Mg Xr24h-cap (Diltiazem hcl er beads) .... Take 1 tablet by mouth once a day 6)  Enalapril Maleate 10 Mg Tabs (Enalapril maleate) .... Take 1 tablet by mouth once a day 7)  Levothyroxine Sodium 88 Mcg Tabs (Levothyroxine sodium) .... Take 1 tablet by mouth once a day 8)  Proventil Hfa 108 (90 Base) Mcg/act Aers (Albuterol sulfate) .... Inhale 2 puffs four times a day as needed 9)  Aspirin 325 Mg Tabs (Aspirin) .... Take one tablet daily. 10)  Oxycodone-acetaminophen 5-500 Mg Caps (Oxycodone-acetaminophen) .... Take 1 tablet by mouth every 6 hours as needed for pain 11)  Flonase 50 Mcg/act Susp (Fluticasone propionate) .... Take 2 sprays in each nostril once daily 12)  Metamucil 30.9 % Powd (Psyllium) .... Take 1 teaspoonful in 8 ounces of water or juice 1-2 times daily. 13)  Naproxen 500 Mg Tabs (Naproxen) .... Take 1 tablet by mouth two times a day after a meal 14)  Cyclobenzaprine Hcl 5 Mg Tabs (Cyclobenzaprine hcl) .... Take 1 tablet by mouth every 8 hours as needed 15)  Trazodone Hcl 50 Mg Tabs (Trazodone hcl) .... Take 1/4 tablet by mouth at bedtime 16)  Imitrex 50 Mg Tabs (Sumatriptan succinate) .... Take 1 tablet by mouth at onset of headache as directed 17)  Prilosec 20 Mg Cpdr (Omeprazole) .... Take 2 capsules by mouth once a day 18)  Hyomax-sl 0.125 Mg Subl (Hyoscyamine sulfate) .... Dissolve 1 to 2 tablets under the tongue (or by  mouth) every 4 hours as needed for chest  pain. 19)  Calcium Carbonate-vitamin D 600-400 Mg-unit Tabs (Calcium carbonate-vitamin d) .... Take 1 tablet by mouth once a day 20)  Colace 100 Mg Caps (Docusate sodium) .... Take 2 capsules by mouth once a day 21)  Diphenhydramine-zinc Acetate 2-0.1 % Crea (Diphenhydramine-zinc acetate) .... Apply to the affected area as needed  Patient Instructions: 1)  Please schedule a follow-up appointment in 3 months. 2)  Please check your blood pressure regularly, if it is >170 please call clinic at [phone] Prescriptions: DIPHENHYDRAMINE-ZINC ACETATE 2-0.1 % CREA (DIPHENHYDRAMINE-ZINC ACETATE) apply to the affected area as needed  #1 x 3   Entered and Authorized by:   Mliss Sax MD   Signed by:   Mliss Sax MD on 11/26/2009   Method used:   Print then Give to Patient   RxID:   [id number]   Prevention & Chronic Care Immunizations   Influenza vaccine: Historical  (04/10/2009)   Influenza vaccine deferral: Not indicated  (11/05/2009)   Influenza vaccine due: 03/11/2010    Tetanus booster: 06/30/2004: Historical   Tetanus booster due: 06/30/2014    Pneumococcal vaccine: Pneumovax (Medicare)  (05/14/2008)    H. zoster vaccine: Not documented   H. zoster vaccine deferral: Not indicated  (11/05/2009)  Colorectal Screening   Hemoccult: negative x 3  (02/25/2009)   Hemoccult action/deferral: Ordered  (10/26/2009)   Hemoccult due: 03/2010    Colonoscopy: Results: Normal. Procedure performed by Dr. Yancey Flemings.  (02/14/2005)   Colonoscopy due: 02/15/2015  Other Screening   Pap smear: Not documented   Pap smear action/deferral: Not indicated S/P hysterectomy  (01/27/2009)    Mammogram: ASSESSMENT: Negative - BI-RADS 1^MM DIGITAL SCREENING  (11/10/2008)   Mammogram action/deferral: Deferred-2 yr interval  (11/26/2009)   Mammogram due: 11/10/2009    DXA bone density scan: Not documented   Smoking status: never  (11/26/2009)  Diabetes  Mellitus   HgbA1C: 6.0  (10/07/2009)   HgbA1C action/deferral: Ordered  (06/02/2009)   Hemoglobin A1C due: 01/07/2010    Eye exam: Mild background diabetic retinopathy OS and OD. Exam at [facility].   (09/03/2009)   Diabetic eye exam action/deferral: Ophthalmology referral  (07/29/2009)   Eye exam due: 09/2010    Foot exam: yes  (01/27/2009)   Foot exam action/deferral: Do today   High risk foot: No  (01/27/2009)   Foot care education: Done  (01/27/2009)   Foot exam due: 01/27/2010    Urine microalbumin/creatinine ratio: 10.3  (01/27/2009)   Urine microalbumin action/deferral: Ordered   Urine microalbumin/cr due: 01/27/2010    Diabetes flowsheet reviewed?: Yes   Progress toward A1C goal: At goal  Lipids   Total Cholesterol: 175  (01/27/2009)   Lipid panel action/deferral: Lipid Panel ordered   LDL: 87  (01/27/2009)   LDL Direct: Not documented   HDL: 66  (01/27/2009)   Triglycerides: 109  (01/27/2009)   Lipid panel due: 01/27/2010    SGOT (AST): 9  (10/07/2009)   BMP action: Ordered   SGPT (ALT): 10  (10/07/2009)   Alkaline phosphatase: 96  (10/07/2009)   Total bilirubin: 0.3  (10/07/2009)    Lipid flowsheet reviewed?: Yes   Progress toward LDL goal: Improved  Hypertension   Last Blood Pressure: 135 / 81  (11/26/2009)   Serum creatinine: 0.69  (10/26/2009)   BMP action: Ordered   Serum potassium 4.4  (10/26/2009)   Basic metabolic panel due: 04/09/2010    Hypertension flowsheet reviewed?: Yes   Progress toward BP goal: At goal  Self-Management Support :  Personal Goals (by the next clinic visit) :     Personal A1C goal: 7  (01/27/2009)     Personal blood pressure goal: 130/80  (01/27/2009)     Personal LDL goal: 100  (01/27/2009)    Patient will work on the following items until the next clinic visit to reach self-care goals:     Medications and monitoring: take my medicines every day, bring all of my medications to every visit, examine my feet  every day  (11/26/2009)     Eating: drink diet soda or water instead of juice or soda, eat more vegetables, use fresh or frozen vegetables, eat foods that are low in salt, eat baked foods instead of fried foods, eat fruit for snacks and desserts  (11/26/2009)     Activity: take a 30 minute walk every day, take the stairs instead of the elevator  (11/26/2009)     Home glucose monitoring frequency: 1 time daily  (01/27/2009)    Diabetes self-management support: Written self-care plan  (11/26/2009)   Diabetes care plan printed    Diabetes self-management support not done because: Good outcomes  (01/27/2009)    Hypertension self-management support: Written self-care plan  (11/26/2009)   Hypertension self-care plan printed.    Lipid self-management support: Written self-care plan  (11/26/2009)   Lipid self-care plan printed.

## 2010-08-12 NOTE — Progress Notes (Signed)
Summary: jphone/gg  Phone Note Call from Patient   Caller: Patient Summary of Call: Pt called c/o pain to right side for for 3 weeks.  also problem going to bath room, stools are soft and narrow.  She is having BM more frequent.  Will see today  Initial call taken by: Merrie Roof RN,  October 26, 2009 11:19 AM

## 2010-08-12 NOTE — Assessment & Plan Note (Signed)
Summary: est-ck/fu/meds/cfb   Vital Signs:  Patient profile:   65 year old female Height:      67 inches Weight:      189.4 pounds BMI:     29.77 Temp:     97.8 degrees F oral Pulse rate:   79 / minute BP sitting:   132 / 83  (right arm)  Vitals Entered By: Filomena Jungling NT II (May 19, 2010 11:29 AM) CC: CHECKUP / LEFT KIDNEY PAIN AND BACK, Depression Is Patient Diabetic? Yes Did you bring your meter with you today? Yes Pain Assessment Patient in pain? yes     Location: left kidney, and back Intensity: 10 Type: aching Onset of pain  November 5,2011 Nutritional Status BMI of 25 - 29 = overweight CBG Result 97  Have you ever been in a relationship where you felt threatened, hurt or afraid?No   Does patient need assistance? Functional Status Self care Ambulation Normal    Primary Care Provider:  Margarito Liner MD  CC:  CHECKUP / LEFT KIDNEY PAIN AND BACK and Depression.  History of Present Illness: Patient presents for followup of her hypertension, hyperlipidemia, and other chronic medical problems; she also has an acute complaint of left flank pain for about 4 days and urinary urgency/frequency for the past month. She reports that she is compliant with her medications.   Depression History:      The patient denies a depressed mood most of the day and a diminished interest in her usual daily activities.         Preventive Screening-Counseling & Management  Alcohol-Tobacco     Alcohol drinks/day: 0     Smoking Status: never  Caffeine-Diet-Exercise     Does Patient Exercise: no     Type of exercise: walking     Times/week: 1  Current Medications (verified): 1)  Lyrica 150 Mg Caps (Pregabalin) .... Take 1 Capsule By Mouth Four Times A Day 2)  Effexor Xr 150 Mg Cp24 (Venlafaxine Hcl) .... Take 2 Capsules By Mouth Every Morning 3)  Klonopin 0.5 Mg Tabs (Clonazepam) .... Take 1 Tablet By Mouth Once A Day As Needed 4)  Pravachol 20 Mg Tabs (Pravastatin Sodium)  .... Take One Tablet Daily To Lower Cholesterol. 5)  Taztia Xt 240 Mg Xr24h-Cap (Diltiazem Hcl Er Beads) .... Take 1 Tablet By Mouth Once A Day 6)  Enalapril Maleate 10 Mg Tabs (Enalapril Maleate) .... Take 1 Tablet By Mouth Once A Day 7)  Levothyroxine Sodium 88 Mcg Tabs (Levothyroxine Sodium) .... Take 1 Tablet By Mouth Once A Day 8)  Proventil Hfa 108 (90 Base) Mcg/act Aers (Albuterol Sulfate) .... Inhale 2 Puffs Four Times A Day As Needed 9)  Aspirin 81 Mg Tbec (Aspirin) .... Take 1 Tablet By Mouth Once A Day 10)  Oxycodone-Acetaminophen 5-500 Mg Caps (Oxycodone-Acetaminophen) .... Take 1 Tablet By Mouth Every 6 Hours As Needed For Pain 11)  Flonase 50 Mcg/act  Susp (Fluticasone Propionate) .... Take 2 Sprays in Each Nostril Once Daily 12)  Trazodone Hcl 50 Mg Tabs (Trazodone Hcl) .... Take 1/4 Tablet By Mouth At Bedtime 13)  Imitrex 50 Mg Tabs (Sumatriptan Succinate) .... Take 1 Tablet By Mouth At Onset of Headache As Directed 14)  Prilosec 20 Mg Cpdr (Omeprazole) .... Take 2 Capsules By Mouth Once A Day 15)  Hyomax-Sl 0.125 Mg  Subl (Hyoscyamine Sulfate) .... Dissolve 1 To 2 Tablets Under The Tongue (Or By Mouth) Every 4 Hours As Needed For Chest Pain. 16)  Calcium Carbonate-Vitamin D 600-400 Mg-Unit  Tabs (Calcium Carbonate-Vitamin D) .... Take 1 Tablet By Mouth Once A Day 17)  Colace 100 Mg Caps (Docusate Sodium) .... Take 2 Capsules By Mouth Once A Day 18)  Diphenhydramine-Zinc Acetate 2-0.1 % Crea (Diphenhydramine-Zinc Acetate) .... Apply To The Affected Area As Needed  Allergies (verified): 1)  ! Penicillin V Potassium (Penicillin V Potassium) 2)  ! Nsaids 3)  ! Voltaren  Review of Systems General:  Reports feeling chilly, temp of 100. GI:  Complains of nausea; denies vomiting; reports chronic intermittent right lower abdominal pain. GU:  Complains of urinary frequency; reports urgency.  Physical Exam  General:  alert, no distress Lungs:  normal respiratory effort, normal  breath sounds, no crackles, and no wheezes.   Heart:  normal rate, regular rhythm, no murmur, no gallop, and no rub.   Abdomen:  soft, non-tender, and normal bowel sounds; no CVA tenderness Extremities:  no edema  Diabetes Management Exam:    Foot Exam (with socks and/or shoes not present):       Sensory-Monofilament:          Left foot: normal          Right foot: normal   Impression & Recommendations:  Problem # 1:  DIABETES MELLITUS, TYPE II (ICD-250.00) Patient's hemoglobin A1c is at goal on diet alone.  Her updated medication list for this problem includes:    Enalapril Maleate 10 Mg Tabs (Enalapril maleate) .Marland Kitchen... Take 1 tablet by mouth once a day    Aspirin 81 Mg Tbec (Aspirin) .Marland Kitchen... Take 1 tablet by mouth once a day  Labs Reviewed: Creat: 0.69 (10/26/2009)     Last Eye Exam: Mild background diabetic retinopathy OS and OD. Exam at [facility].  (09/03/2009) Reviewed HgBA1c results: 5.8 (05/19/2010)  5.5 (12/30/2009)  Orders: T-Hgb A1C (in-house) (40981XB) T-Urine Microalbumin w/creat. ratio [phone]) T- Capillary Blood Glucose (57846)  Problem # 2:  HYPERTENSION (ICD-401.9) Patient's blood pressure is essentially at goal on current regimen.  Her updated medication list for this problem includes:    Taztia Xt 240 Mg Xr24h-cap (Diltiazem hcl er beads) .Marland Kitchen... Take 1 tablet by mouth once a day    Enalapril Maleate 10 Mg Tabs (Enalapril maleate) .Marland Kitchen... Take 1 tablet by mouth once a day  BP today: 132/83 Prior BP: 124/81 (12/30/2009)  Labs Reviewed: K+: 4.4 (10/26/2009) Creat: : 0.69 (10/26/2009)   Chol: 193 (12/30/2009)   HDL: 71 (12/30/2009)   LDL: 95 (12/30/2009)   TG: 136 (12/30/2009)  Problem # 3:  HYPERCHOLESTEROLEMIA (ICD-272.0) Patient is doing well on pravastatin with no apparent side effects. Will check labs as below.  Her updated medication list for this problem includes:    Pravachol 20 Mg Tabs (Pravastatin sodium) .Marland Kitchen... Take one  tablet daily to lower cholesterol.  Labs Reviewed: SGOT: 9 (10/07/2009)   SGPT: 10 (10/07/2009)   HDL:71 (12/30/2009), 66 (01/27/2009)  LDL:95 (12/30/2009), 87 (01/27/2009)  Chol:193 (12/30/2009), 175 (01/27/2009)  Trig:136 (12/30/2009), 109 (01/27/2009)  Orders: T-Comprehensive Metabolic Panel ([id number]) T-Lipid Profile ([id number])  Problem # 4:  URINARY FREQUENCY (ICD-788.41) Patient reports left flank pain and urinary frequency, suggestive of a urinary tract infection. The plan is to treat presumptively with Cipro 250 mg b.i.d. for 7 days, and obtain labs as below.  Orders: T-Urinalysis ([id number]) T-Culture, Urine ([id number]) T-Urinalysis Dipstick only ([id number])  Problem # 5:  HYPOTHYROIDISM (ICD-244.9) Plan is to continue levothyroxine at current dose.  Her updated medication list for this problem includes:  Levothyroxine Sodium 88 Mcg Tabs (Levothyroxine sodium) .Marland Kitchen... Take 1 tablet by mouth once a day  Labs Reviewed: TSH: 1.555 (12/30/2009)    HgBA1c: 5.8 (05/19/2010) Chol: 193 (12/30/2009)   HDL: 71 (12/30/2009)   LDL: 95 (12/30/2009)   TG: 136 (12/30/2009)  Complete Medication List: 1)  Lyrica 150 Mg Caps (Pregabalin) .... Take 1 capsule by mouth four times a day 2)  Effexor Xr 150 Mg Cp24 (Venlafaxine hcl) .... Take 2 capsules by mouth every morning 3)  Klonopin 0.5 Mg Tabs (Clonazepam) .... Take 1 tablet by mouth once a day as needed 4)  Pravachol 20 Mg Tabs (Pravastatin sodium) .... Take one tablet daily to lower cholesterol. 5)  Taztia Xt 240 Mg Xr24h-cap (Diltiazem hcl er beads) .... Take 1 tablet by mouth once a day 6)  Enalapril Maleate 10 Mg Tabs (Enalapril maleate) .... Take 1 tablet by mouth once a day 7)  Levothyroxine Sodium 88 Mcg Tabs (Levothyroxine sodium) .... Take 1 tablet by mouth once a day 8)  Proventil Hfa 108 (90 Base) Mcg/act Aers (Albuterol sulfate) .... Inhale 2 puffs four times a day as needed 9)  Aspirin 81 Mg Tbec (Aspirin) ....  Take 1 tablet by mouth once a day 10)  Oxycodone-acetaminophen 5-500 Mg Caps (Oxycodone-acetaminophen) .... Take 1 tablet by mouth every 6 hours as needed for pain 11)  Flonase 50 Mcg/act Susp (Fluticasone propionate) .... Take 2 sprays in each nostril once daily 12)  Trazodone Hcl 50 Mg Tabs (Trazodone hcl) .... Take 1/4 tablet by mouth at bedtime 13)  Imitrex 50 Mg Tabs (Sumatriptan succinate) .... Take 1 tablet by mouth at onset of headache as directed 14)  Prilosec 20 Mg Cpdr (Omeprazole) .... Take 2 capsules by mouth once a day 15)  Hyomax-sl 0.125 Mg Subl (Hyoscyamine sulfate) .... Dissolve 1 to 2 tablets under the tongue (or by mouth) every 4 hours as needed for chest pain. 16)  Calcium Carbonate-vitamin D 600-400 Mg-unit Tabs (Calcium carbonate-vitamin d) .... Take 1 tablet by mouth once a day 17)  Colace 100 Mg Caps (Docusate sodium) .... Take 2 capsules by mouth once a day 18)  Diphenhydramine-zinc Acetate 2-0.1 % Crea (Diphenhydramine-zinc acetate) .... Apply to the affected area as needed 19)  Ciprofloxacin Hcl 250 Mg Tabs (Ciprofloxacin hcl) .... Take 1 tablet by mouth two times a day for 7 days  Patient Instructions: 1)  Please schedule a follow-up appointment in 2 weeks. 2)  Take ciprofloxacin 250 mg two times a day for 7 days.  Prescriptions: CIPROFLOXACIN HCL 250 MG TABS (CIPROFLOXACIN HCL) Take 1 tablet by mouth two times a day for 7 days  #14 x 0   Entered and Authorized by:   Margarito Liner MD   Signed by:   Margarito Liner MD on 05/19/2010   Method used:   Electronically to        [facility]  [phone]* (retail)       [address]       [place], [place]  [address]       Ph: [phone] or [phone]       Fax: [phone]   RxID:   [id number]    Orders Added: 1)  T-Hgb A1C (in-house) [[id number]] 2)  T-Urine Microalbumin w/creat. ratio [[id number]] 3)  T-Comprehensive Metabolic Panel [[id number]] 4)  T-Urinalysis  [81003-65000] 5)  T-Culture, Urine [[id number]] 6)  T-Lipid Profile [[id number]] 7)  T-Urinalysis Dipstick only [81003QW] 8)  T-  Capillary Blood Glucose [82948] 9)  Est. Patient Level III [29562]   Immunization History:  Influenza Immunization History:    Influenza:  historical (04/10/2010)   Immunization History:  Influenza Immunization History:    Influenza:  Historical (04/10/2010)  Prevention & Chronic Care Immunizations   Influenza vaccine: Historical  (04/10/2010)   Influenza vaccine deferral: Deferred  (12/30/2009)   Influenza vaccine due: 03/11/2010    Tetanus booster: 06/30/2004: Historical   Tetanus booster due: 06/30/2014    Pneumococcal vaccine: Pneumovax (Medicare)  (05/14/2008)    H. zoster vaccine: Not documented   H. zoster vaccine deferral: Not indicated  (11/05/2009)  Colorectal Screening   Hemoccult: negative x 3  (02/25/2009)   Hemoccult action/deferral: Ordered  (10/26/2009)   Hemoccult due: 03/2010    Colonoscopy: Results: Normal. Procedure performed by Dr. Yancey Flemings.  (02/14/2005)   Colonoscopy due: 02/15/2015  Other Screening   Pap smear: Not documented   Pap smear action/deferral: Not indicated S/P hysterectomy  (01/27/2009)    Mammogram: ASSESSMENT: Negative - BI-RADS 1^MM DIGITAL SCREENING  (12/02/2009)   Mammogram action/deferral: Deferred-2 yr interval  (11/26/2009)   Mammogram due: 11/10/2009    DXA bone density scan: Not documented   Smoking status: never  (05/19/2010)  Diabetes Mellitus   HgbA1C: 5.8  (05/19/2010)   HgbA1C action/deferral: Ordered  (06/02/2009)   Hemoglobin A1C due: 01/07/2010    Eye exam: Mild background diabetic retinopathy OS and OD. Exam at [facility].   (09/03/2009)   Diabetic eye exam action/deferral: Ophthalmology referral  (07/29/2009)   Eye exam due: 09/2010    Foot exam: yes  (05/19/2010)   Foot exam action/deferral: Do today   High risk foot: No  (05/19/2010)   Foot care  education: Done  (05/19/2010)   Foot exam due: 05/20/2011    Urine microalbumin/creatinine ratio: 10.3  (01/27/2009)   Urine microalbumin action/deferral: Ordered   Urine microalbumin/cr due: 01/27/2010    Diabetes flowsheet reviewed?: Yes   Progress toward A1C goal: At goal  Lipids   Total Cholesterol: 193  (12/30/2009)   Lipid panel action/deferral: Lipid Panel ordered   LDL: 95  (12/30/2009)   LDL Direct: Not documented   HDL: 71  (12/30/2009)   Triglycerides: 136  (12/30/2009)   Lipid panel due: 01/27/2010    SGOT (AST): 9  (10/07/2009)   BMP action: Ordered   SGPT (ALT): 10  (10/07/2009) CMP ordered    Alkaline phosphatase: 96  (10/07/2009)   Total bilirubin: 0.3  (10/07/2009)    Lipid flowsheet reviewed?: Yes   Progress toward LDL goal: At goal  Hypertension   Last Blood Pressure: 132 / 83  (05/19/2010)   Serum creatinine: 0.69  (10/26/2009)   BMP action: Ordered   Serum potassium 4.4  (10/26/2009) CMP ordered    Basic metabolic panel due: 04/09/2010    Hypertension flowsheet reviewed?: Yes   Progress toward BP goal: At goal  Self-Management Support :   Personal Goals (by the next clinic visit) :     Personal A1C goal: 7  (01/27/2009)     Personal blood pressure goal: 130/80  (01/27/2009)     Personal LDL goal: 100  (01/27/2009)    Patient will work on the following items until the next clinic visit to reach self-care goals:     Medications and monitoring: take my medicines every day, bring all of my medications to every visit  (05/19/2010)     Eating: eat more vegetables, use fresh or frozen vegetables, eat  foods that are low in salt, eat fruit for snacks and desserts  (05/19/2010)     Activity: take a 30 minute walk every day  (05/19/2010)     Home glucose monitoring frequency: 1 time daily  (01/27/2009)    Diabetes self-management support: Written self-care plan  (05/19/2010)   Diabetes care plan printed    Diabetes self-management support not done  because: Good outcomes  (01/27/2009)    Hypertension self-management support: Written self-care plan  (05/19/2010)   Hypertension self-care plan printed.    Lipid self-management support: Written self-care plan  (05/19/2010)   Lipid self-care plan printed.   Nursing Instructions: Diabetic foot exam today    Diabetic Foot Exam Foot Inspection Is there a history of a foot ulcer?              No Is there a foot ulcer now?              No Can the patient see the bottom of their feet?          Yes Are the shoes appropriate in style and fit?          Yes Is there swelling or an abnormal foot shape?          No Are the toenails long?                No Are the toenails thick?                No Are the toenails ingrown?              No Is there heavy callous build-up?              No Is there pain in the calf muscle (Intermittent claudication) when walking?    NoIs there a claw toe deformity?              No Is there elevated skin temperature?            No Is there limited ankle dorsiflexion?            No Is there foot or ankle muscle weakness?            No  Diabetic Foot Care Education Patient educated on appropriate care of diabetic feet.  Pulse Check          Right Foot          Left Foot Posterior Tibial:        normal            normal Dorsalis Pedis:        normal            normal Comments: PATIENT HAS BUNION ON LEFT FOOT High Risk Feet? No Set Next Diabetic Foot Exam here: 05/20/2011   10-g (5.07) Semmes-Weinstein Monofilament Test Performed by: Filomena Jungling NT II          Right Foot          Left Foot Visual Inspection               Site 1         normal         normal Site 2         normal         normal Site 3         normal         normal Site 4  normal         normal Site 5         normal         normal Site 6         normal         normal Site 7         normal         normal Site 8         normal         normal Site 9         normal          normal Site 10         normal         normal  Impression      normal         normal   Laboratory Results   Urine Tests  Date/Time Received: 05-19-10-12:16  Routine Urinalysis   Color: yellow Appearance: Hazy Glucose: negative   (Normal Range: Negative) Bilirubin: negative   (Normal Range: Negative) Ketone: negative   (Normal Range: Negative) Spec. Gravity: 1.010   (Normal Range: 1.003-1.035) Blood: trace-lysed   (Normal Range: Negative) pH: 7.0   (Normal Range: 5.0-8.0) Protein: negative   (Normal Range: Negative) Urobilinogen: 0.2   (Normal Range: 0-1) Nitrite: negative   (Normal Range: Negative) Leukocyte Esterace: small   (Normal Range: Negative)     Blood Tests   Date/Time Received: May 19, 2010 11:44 AM Date/Time Reported: Alric Quan  May 19, 2010 11:44 AM   HGBA1C: 5.8%   (Normal Range: Non-Diabetic - 3-6%   Control Diabetic - 6-8%) CBG Random:: 97mg /dL    Process Orders Check Orders Results:     [facility]: ABN not required for this insurance Tests Sent for requisitioning (May 24, 2010 10:58 AM):     05/19/2010: [facility] -- T-Urine Microalbumin w/creat. ratio [[id number]] (signed)     05/19/2010: [facility] -- T-Comprehensive Metabolic Panel [[id number]] (signed)     05/19/2010: [facility] -- T-Urinalysis [[id number]] (signed)     05/19/2010: [facility] -- T-Culture, Urine [[id number]] (signed)     05/19/2010: [facility] -- T-Lipid Profile [phone] (signed)

## 2010-08-12 NOTE — Assessment & Plan Note (Signed)
Summary: FU VISIT/DS   Vital Signs:  Patient profile:   65 year old female Height:      67 inches Weight:      188.4 pounds BMI:     29.61 Temp:     97.1 degrees F oral Pulse rate:   75 / minute BP sitting:   124 / 75  (right arm)  Vitals Entered By: Filomena Jungling NT II (October 07, 2009 12:07 PM) CC: sugars running low, acid reflux,out of reflux medicine for 2 days, has changed diet recently Is Patient Diabetic? Yes Did you bring your meter with you today? No Nutritional Status BMI of 25 - 29 = overweight CBG Result 103  Does patient need assistance? Functional Status Self care Ambulation Normal   Primary Care Provider:  Margarito Liner MD  CC:  sugars running low, acid reflux, out of reflux medicine for 2 days, and has changed diet recently.  History of Present Illness: Patient returns for follow up of her diabetes mellitus, hypertension, hyperlipidemia, and other chronic medical problems.  She reports recent worsening of her reflux symptoms after she ran out of Prilosec; she reports that her reflux is well controlled when she is taking Prilosec. She reports that her endocrine physician advised her to stop metformin more than a month ago because of low blood sugars, some of which were symptomatic. She says that she has taken metformin 500 mg one tablet intermittently over the past couple of months, but not on a regular basis.She is followed at the pain clinic for management of her chronic pain, and also by other specialists for management of her various chronic conditions.  Depression History:      The patient denies a depressed mood most of the day and a diminished interest in her usual daily activities.         Preventive Screening-Counseling & Management  Alcohol-Tobacco     Alcohol drinks/day: 0     Smoking Status: never  Caffeine-Diet-Exercise     Does Patient Exercise: yes     Type of exercise: walking     Times/week: 1  Current Medications (verified): 1)  Taztia Xt  240 Mg Xr24h-Cap (Diltiazem Hcl Er Beads) .... Take 1 Tablet By Mouth Once A Day 2)  Klonopin 0.5 Mg Tabs (Clonazepam) .... Take 1 Tablet By Mouth Once A Day As Needed 3)  Enalapril Maleate 10 Mg Tabs (Enalapril Maleate) .... Take 1 Tablet By Mouth Once A Day 4)  Levothyroxine Sodium 88 Mcg Tabs (Levothyroxine Sodium) .... Take 1 Tablet By Mouth Once A Day 5)  Imitrex 50 Mg Tabs (Sumatriptan Succinate) .... Take 1 Tablet By Mouth At Onset of Headache As Directed 6)  Lyrica 150 Mg Caps (Pregabalin) .... Take 1 Capsule By Mouth Four Times A Day 7)  Colace 100 Mg Caps (Docusate Sodium) .... Take 2 Capsules By Mouth Once A Day 8)  Prilosec 20 Mg Cpdr (Omeprazole) .... Take 2 Capsules By Mouth Once A Day 9)  Proventil Hfa 108 (90 Base) Mcg/act Aers (Albuterol Sulfate) .... Inhale 2 Puffs Four Times A Day As Needed 10)  Effexor Xr 150 Mg Cp24 (Venlafaxine Hcl) .... Take 2 Capsules By Mouth Once A Day 11)  Aspirin 325 Mg Tabs (Aspirin) .... Take One Tablet Daily. 12)  Oxycodone-Acetaminophen 5-500 Mg Caps (Oxycodone-Acetaminophen) .... Take 1 Tablet By Mouth Every 6 Hours As Needed For Pain 13)  Calcium Carbonate-Vitamin D 600-400 Mg-Unit  Tabs (Calcium Carbonate-Vitamin D) .... Take 1 Tablet By Mouth Once A  Day 14)  Flonase 50 Mcg/act  Susp (Fluticasone Propionate) .... Take 2 Sprays in Each Nostril Once Daily 15)  Hyomax-Sl 0.125 Mg  Subl (Hyoscyamine Sulfate) .... Dissolve 1 To 2 Tablets Under The Tongue (Or By Mouth) Every 4 Hours As Needed For Chest Pain. 16)  Metformin Hcl 500 Mg Xr24h-Tab (Metformin Hcl) .... Take 1 Tablet By Mouth Once A Day 17)  Pravachol 20 Mg Tabs (Pravastatin Sodium) .... Take One Tablet Daily To Lower Cholesterol. 18)  Metamucil 30.9 % Powd (Psyllium) .... Take 1 Teaspoonful in 8 Ounces of Water or Juice 1-2 Times Daily. 19)  Naproxen 500 Mg Tabs (Naproxen) .... Take 1 Tablet By Mouth Two Times A Day After A Meal 20)  Cyclobenzaprine Hcl 5 Mg Tabs (Cyclobenzaprine Hcl) ....  Take 1 Tablet By Mouth Every 8 Hours As Needed 21)  Trazodone Hcl 50 Mg Tabs (Trazodone Hcl) .... Take 1/4 Tablet By Mouth At Bedtime  Allergies (verified): 1)  ! Penicillin V Potassium (Penicillin V Potassium) 2)  ! Nsaids 3)  ! Voltaren  Physical Exam  General:  alert, no distress Lungs:  normal respiratory effort, normal breath sounds, no crackles, and no wheezes.   Heart:  normal rate, regular rhythm, no murmur, no gallop, and no rub.   Abdomen:  soft, non-tender, and normal bowel sounds.   Extremities:  no edema   Impression & Recommendations:  Problem # 1:  DIABETES MELLITUS, TYPE II (ICD-250.00) Patient reports that her endocrinologist advised her to stop metformin because of recurrent low blood sugars. She reports that she has taken the medication intermittently over the past month or two.  I advised her to stop metformin entirely, and continue to follow her blood glucose. Will recheck a hemoglobin A1c upon return.  The following medications were removed from the medication list:    Metformin Hcl 500 Mg Xr24h-tab (Metformin hcl) .Marland Kitchen... Take 1 tablet by mouth once a day Her updated medication list for this problem includes:    Enalapril Maleate 10 Mg Tabs (Enalapril maleate) .Marland Kitchen... Take 1 tablet by mouth once a day    Aspirin 325 Mg Tabs (Aspirin) .Marland Kitchen... Take one tablet daily.  Labs Reviewed: Creat: 0.70 (06/02/2009)     Last Eye Exam: Mild background diabetic retinopathy OS and OD. Exam at [facility].  (09/03/2009) Reviewed HgBA1c results: 6.0 (10/07/2009)  5.7 (06/02/2009)  Orders: T-Hgb A1C (in-house) ([id number]) T-Comprehensive Metabolic Panel ([id number]) T- Capillary Blood Glucose (30865)  Problem # 2:  HYPERTENSION (ICD-401.9) Patient's blood pressure is well controlled on current regimen.  Plan is to continue current antihypertensive medications.  Her updated medication list for this problem includes:    Taztia Xt 240 Mg Xr24h-cap (Diltiazem hcl  er beads) .Marland Kitchen... Take 1 tablet by mouth once a day    Enalapril Maleate 10 Mg Tabs (Enalapril maleate) .Marland Kitchen... Take 1 tablet by mouth once a day  BP today: 124/75 Prior BP: 119/79 (07/29/2009)  Labs Reviewed: K+: 4.6 (06/02/2009) Creat: : 0.70 (06/02/2009)   Chol: 175 (01/27/2009)   HDL: 66 (01/27/2009)   LDL: 87 (01/27/2009)   TG: 109 (01/27/2009)  Problem # 3:  HYPERCHOLESTEROLEMIA (ICD-272.0) Patient is at goal on current medication regimen.  Her updated medication list for this problem includes:    Pravachol 20 Mg Tabs (Pravastatin sodium) .Marland Kitchen... Take one tablet daily to lower cholesterol.  Labs Reviewed: SGOT: 10 (06/02/2009)   SGPT: 10 (06/02/2009)   HDL:66 (01/27/2009), 56 (05/20/2008)  LDL:87 (01/27/2009), 106 (78/46/9629)  Chol:175 (01/27/2009), 204 (  05/20/2008)  Trig:109 (01/27/2009), 210 (05/20/2008)  Problem # 4:  GERD (ICD-530.81) Patient is currently out of Prilosec, and has had a return of reflux symptoms. I advised her to have the medication refilled and resume at previous dose.  Her updated medication list for this problem includes:    Prilosec 20 Mg Cpdr (Omeprazole) .Marland Kitchen... Take 2 capsules by mouth once a day    Hyomax-sl 0.125 Mg Subl (Hyoscyamine sulfate) .Marland Kitchen... Dissolve 1 to 2 tablets under the tongue (or by mouth) every 4 hours as needed for chest pain.  Complete Medication List: 1)  Taztia Xt 240 Mg Xr24h-cap (Diltiazem hcl er beads) .... Take 1 tablet by mouth once a day 2)  Klonopin 0.5 Mg Tabs (Clonazepam) .... Take 1 tablet by mouth once a day as needed 3)  Enalapril Maleate 10 Mg Tabs (Enalapril maleate) .... Take 1 tablet by mouth once a day 4)  Levothyroxine Sodium 88 Mcg Tabs (Levothyroxine sodium) .... Take 1 tablet by mouth once a day 5)  Imitrex 50 Mg Tabs (Sumatriptan succinate) .... Take 1 tablet by mouth at onset of headache as directed 6)  Lyrica 150 Mg Caps (Pregabalin) .... Take 1 capsule by mouth four times a day 7)  Colace 100 Mg Caps (Docusate  sodium) .... Take 2 capsules by mouth once a day 8)  Prilosec 20 Mg Cpdr (Omeprazole) .... Take 2 capsules by mouth once a day 9)  Proventil Hfa 108 (90 Base) Mcg/act Aers (Albuterol sulfate) .... Inhale 2 puffs four times a day as needed 10)  Effexor Xr 150 Mg Cp24 (Venlafaxine hcl) .... Take 2 capsules by mouth once a day 11)  Aspirin 325 Mg Tabs (Aspirin) .... Take one tablet daily. 12)  Oxycodone-acetaminophen 5-500 Mg Caps (Oxycodone-acetaminophen) .... Take 1 tablet by mouth every 6 hours as needed for pain 13)  Calcium Carbonate-vitamin D 600-400 Mg-unit Tabs (Calcium carbonate-vitamin d) .... Take 1 tablet by mouth once a day 14)  Flonase 50 Mcg/act Susp (Fluticasone propionate) .... Take 2 sprays in each nostril once daily 15)  Hyomax-sl 0.125 Mg Subl (Hyoscyamine sulfate) .... Dissolve 1 to 2 tablets under the tongue (or by mouth) every 4 hours as needed for chest pain. 16)  Pravachol 20 Mg Tabs (Pravastatin sodium) .... Take one tablet daily to lower cholesterol. 17)  Metamucil 30.9 % Powd (Psyllium) .... Take 1 teaspoonful in 8 ounces of water or juice 1-2 times daily. 18)  Naproxen 500 Mg Tabs (Naproxen) .... Take 1 tablet by mouth two times a day after a meal 19)  Cyclobenzaprine Hcl 5 Mg Tabs (Cyclobenzaprine hcl) .... Take 1 tablet by mouth every 8 hours as needed 20)  Trazodone Hcl 50 Mg Tabs (Trazodone hcl) .... Take 1/4 tablet by mouth at bedtime  Patient Instructions: 1)  Please schedule a follow-up appointment in 1 month. 2)  Stop metformin. 3)  Call if your blood sugars increase off of metformin. 4)  Please have your omeprazole refilled.   Process Orders Check Orders Results:     [facility]: ABN not required for this insurance Tests Sent for requisitioning (October 09, 2009 5:39 PM):     10/07/2009: [facility] -- T-Comprehensive Metabolic Panel [[id number]] (signed)   Prevention & Chronic Care Immunizations   Influenza vaccine:  Historical  (04/10/2009)   Influenza vaccine deferral: Not available  (01/27/2009)   Influenza vaccine due: 03/11/2010    Tetanus booster: 06/30/2004: Historical   Tetanus booster due: 06/30/2014    Pneumococcal  vaccine: Pneumovax (Medicare)  (05/14/2008)    H. zoster vaccine: Not documented   H. zoster vaccine deferral: Deferred  (01/27/2009)  Colorectal Screening   Hemoccult: negative x 3  (02/25/2009)   Hemoccult action/deferral: Ordered  (01/27/2009)   Hemoccult due: 03/2010    Colonoscopy: Results: Normal. Procedure performed by Dr. Yancey Flemings.  (02/14/2005)   Colonoscopy due: 02/15/2015  Other Screening   Pap smear: Not documented   Pap smear action/deferral: Not indicated S/P hysterectomy  (01/27/2009)    Mammogram: ASSESSMENT: Negative - BI-RADS 1^MM DIGITAL SCREENING  (11/10/2008)   Mammogram due: 11/10/2009    DXA bone density scan: Not documented   Smoking status: never  (10/07/2009)  Diabetes Mellitus   HgbA1C: 6.0  (10/07/2009)   HgbA1C action/deferral: Ordered  (06/02/2009)   Hemoglobin A1C due: 01/07/2010    Eye exam: Mild background diabetic retinopathy OS and OD. Exam at [facility].   (09/03/2009)   Diabetic eye exam action/deferral: Ophthalmology referral  (07/29/2009)   Eye exam due: 09/2010    Foot exam: yes  (01/27/2009)   Foot exam action/deferral: Do today   High risk foot: No  (01/27/2009)   Foot care education: Done  (01/27/2009)   Foot exam due: 01/27/2010    Urine microalbumin/creatinine ratio: 10.3  (01/27/2009)   Urine microalbumin action/deferral: Ordered   Urine microalbumin/cr due: 01/27/2010    Diabetes flowsheet reviewed?: Yes   Progress toward A1C goal: At goal  Lipids   Total Cholesterol: 175  (01/27/2009)   Lipid panel action/deferral: Lipid Panel ordered   LDL: 87  (01/27/2009)   LDL Direct: Not documented   HDL: 66  (01/27/2009)   Triglycerides: 109  (01/27/2009)   Lipid panel due: 01/27/2010    SGOT  (AST): 10  (06/02/2009)   BMP action: Ordered   SGPT (ALT): 10  (06/02/2009) CMP ordered    Alkaline phosphatase: 91  (06/02/2009)   Total bilirubin: 0.3  (06/02/2009)    Lipid flowsheet reviewed?: Yes   Progress toward LDL goal: At goal  Hypertension   Last Blood Pressure: 124 / 75  (10/07/2009)   Serum creatinine: 0.70  (06/02/2009)   BMP action: Ordered   Serum potassium 4.6  (06/02/2009) CMP ordered    Basic metabolic panel due: 04/09/2010    Hypertension flowsheet reviewed?: Yes   Progress toward BP goal: At goal  Self-Management Support :   Personal Goals (by the next clinic visit) :     Personal A1C goal: 7  (01/27/2009)     Personal blood pressure goal: 130/80  (01/27/2009)     Personal LDL goal: 100  (01/27/2009)    Patient will work on the following items until the next clinic visit to reach self-care goals:     Medications and monitoring: take my medicines every day, check my blood sugar, examine my feet every day  (10/07/2009)     Eating: drink diet soda or water instead of juice or soda, eat more vegetables, use fresh or frozen vegetables, eat foods that are low in salt, eat baked foods instead of fried foods, eat fruit for snacks and desserts, limit or avoid alcohol  (10/07/2009)     Activity: take the stairs instead of the elevator, park at the far end of the parking lot  (07/29/2009)     Home glucose monitoring frequency: 1 time daily  (01/27/2009)    Diabetes self-management support: Education handout, Resources for patients handout, Written self-care plan  (10/07/2009)   Diabetes care plan printed  Diabetes education handout printed    Diabetes self-management support not done because: Good outcomes  (01/27/2009)    Hypertension self-management support: Education handout, Resources for patients handout, Written self-care plan  (10/07/2009)   Hypertension self-care plan printed.   Hypertension education handout printed    Lipid self-management support:  Education handout, Resources for patients handout, Written self-care plan  (10/07/2009)   Lipid self-care plan printed.   Lipid education handout printed      Resource handout printed.    Laboratory Results   Blood Tests   Date/Time Received: October 07, 2009 12:22 PM  Date/Time Reported: Burke Keels  October 07, 2009 12:22 PM   HGBA1C: 6.0%   (Normal Range: Non-Diabetic - 3-6%   Control Diabetic - 6-8%) CBG Random:: 103mg /dL

## 2010-08-12 NOTE — Progress Notes (Signed)
Summary: thumb pain/ hla  Phone Note Call from Patient   Summary of Call: pt called left message that she has pain in thumb to shoulder, rtc, got voicemail, lm for pt to call back Initial call taken by: Marin Roberts RN,  June 25, 2010 4:59 PM

## 2010-08-12 NOTE — Assessment & Plan Note (Signed)
Summary: EST-ROUTINE CHECKUP/CH   Vital Signs:  Patient profile:   65 year old female Height:      67 inches Weight:      195.0 pounds BMI:     30.65 Temp:     98.0 degrees F oral Pulse rate:   91 / minute BP sitting:   149 / 82  (right arm)  Vitals Entered By: Filomena Jungling NT II (July 28, 2010 10:32 AM)  CC: Left arm hurts between fingers and goes up side and neck and shoulder/ still having pain on right side that goes to shoulder Is Patient Diabetic? Yes Pain Assessment Patient in pain? yes     Location: left arm and side pain Intensity: 8 Type: aching Onset of pain  Intermittent Nutritional Status BMI of > 30 = obese CBG Result 97  Have you ever been in a relationship where you felt threatened, hurt or afraid?No   Does patient need assistance? Functional Status Self care Ambulation Normal   Primary Care Provider:  Margarito Liner MD  CC:  Left arm hurts between fingers and goes up side and neck and shoulder/ still having pain on right side that goes to shoulder.  History of Present Illness: Patient presents with complaint of intermittent pain in her left hand and arm for about 2 weeks, with some associated numbness of the index finger; the pain comes and goes a couple of times a day.  She has been treated in the past with wrist splints for presumed carpal tunnel syndrome with improvement in her symptoms, and she reports that she has wrist splints at home but has not been wearing them recently.  She denies any injury.  She also reports pain in left low back.   She still has some urinary frequency/urgency despite empiric treatment with ciprofloxacin for possible UTI following her last visit.  She reports that she is compliant with her medications.    Preventive Screening-Counseling & Management  Alcohol-Tobacco     Alcohol drinks/day: 0     Smoking Status: never  Caffeine-Diet-Exercise     Does Patient Exercise: no     Type of exercise: walking     Times/week:  1  Current Medications (verified): 1)  Lyrica 150 Mg Caps (Pregabalin) .... Take 1 Capsule By Mouth Four Times A Day 2)  Effexor Xr 150 Mg Cp24 (Venlafaxine Hcl) .... Take 2 Capsules By Mouth Every Morning 3)  Klonopin 0.5 Mg Tabs (Clonazepam) .... Take 1 Tablet By Mouth Once A Day As Needed 4)  Pravachol 20 Mg Tabs (Pravastatin Sodium) .... Take One Tablet Daily To Lower Cholesterol. 5)  Taztia Xt 240 Mg Xr24h-Cap (Diltiazem Hcl Er Beads) .... Take 1 Tablet By Mouth Once A Day 6)  Enalapril Maleate 10 Mg Tabs (Enalapril Maleate) .... Take 1 Tablet By Mouth Once A Day 7)  Levothyroxine Sodium 88 Mcg Tabs (Levothyroxine Sodium) .... Take 1 Tablet By Mouth Once A Day 8)  Proventil Hfa 108 (90 Base) Mcg/act Aers (Albuterol Sulfate) .... Inhale 2 Puffs Four Times A Day As Needed 9)  Aspirin 81 Mg Tbec (Aspirin) .... Take 1 Tablet By Mouth Once A Day 10)  Oxycodone-Acetaminophen 5-500 Mg Caps (Oxycodone-Acetaminophen) .... Take 1 Tablet By Mouth Every 6 Hours As Needed For Pain 11)  Flonase 50 Mcg/act  Susp (Fluticasone Propionate) .... Take 2 Sprays in Each Nostril Once Daily 12)  Imitrex 50 Mg Tabs (Sumatriptan Succinate) .... Take 1 Tablet By Mouth At Onset of Headache As Directed 13)  Prilosec 20 Mg Cpdr (Omeprazole) .... Take 2 Capsules By Mouth Once A Day 14)  Hyomax-Sl 0.125 Mg  Subl (Hyoscyamine Sulfate) .... Dissolve 1 To 2 Tablets Under The Tongue (Or By Mouth) Every 4 Hours As Needed For Chest Pain. 15)  Calcium Carbonate-Vitamin D 600-400 Mg-Unit  Tabs (Calcium Carbonate-Vitamin D) .... Take 1 Tablet By Mouth Once A Day 16)  Colace 100 Mg Caps (Docusate Sodium) .... Take 2 Capsules By Mouth Once A Day  Allergies (verified): 1)  ! Penicillin V Potassium (Penicillin V Potassium) 2)  ! Nsaids 3)  ! Voltaren  Past History:  Past Medical History: Diabetes mellitus, Type II TIA: evaluated in 2007 with MRI, had changes: ischemic versus  demyelinating Anxiety GERD Hypertension Hypothyroidism Depression Allergic rhinitis Fibromyalgia Back pain Hemorrhoids Migaine  Review of Systems General:  Denies chills, fever, and sweats. GI:  Denies abdominal pain; reports some blood on toilet paper following bowel movements in small amounts only; no significant bleeding. GU:  Denies dysuria.  Physical Exam  General:  alert, no distress Lungs:  normal respiratory effort, normal breath sounds, no crackles, and no wheezes.   Heart:  normal rate, regular rhythm, no murmur, no gallop, and no rub.   Abdomen:  soft and normal bowel sounds; mild tenderness lower mid-abdomen; no hepatospleomegaly Msk:  exam of left upper extremity shows no tenderness, swelling, or erythema; Tinel's negative Extremities:  no edema Neurologic:  left upper extremity strength normal Additional Exam:  no CVA tenderness   Impression & Recommendations:  Problem # 1:  DIABETES MELLITUS, TYPE II (ICD-250.00) Patient's diabetes mellitus is well controlled on current regimen.  Plan is to continue current medications, and continue home capillary blood glucose monitoring.   Her updated medication list for this problem includes:    Enalapril Maleate 10 Mg Tabs (Enalapril maleate) .Marland Kitchen... Take 1 tablet by mouth once a day    Aspirin 81 Mg Tbec (Aspirin) .Marland Kitchen... Take 1 tablet by mouth once a day  Orders: T-Hgb A1C (in-house) (16109UE) T- Capillary Blood Glucose (45409)  Labs Reviewed: Creat: 0.73 (05/19/2010)     Last Eye Exam: Mild background diabetic retinopathy OS and OD. Exam at [facility].  (09/03/2009) Reviewed HgBA1c results: 5.5 (07/28/2010)  5.8 (05/19/2010)  Problem # 2:  HYPERTENSION (ICD-401.9) Blood pressure is mildly elevated; will continue current regimen and recheck at next visit.  Her updated medication list for this problem includes:    Taztia Xt 240 Mg Xr24h-cap (Diltiazem hcl er beads) .Marland Kitchen... Take 1 tablet by mouth once a  day    Enalapril Maleate 10 Mg Tabs (Enalapril maleate) .Marland Kitchen... Take 1 tablet by mouth once a day  BP today: 149/82 Prior BP: 132/83 (05/19/2010)  Labs Reviewed: K+: 4.4 (05/19/2010) Creat: : 0.73 (05/19/2010)   Chol: 189 (05/19/2010)   HDL: 72 (05/19/2010)   LDL: 97 (05/19/2010)   TG: 100 (05/19/2010)  Problem # 3:  HYPERCHOLESTEROLEMIA (ICD-272.0) Patient is doing well on Pravachol; LDL is at goal.  Her updated medication list for this problem includes:    Pravachol 20 Mg Tabs (Pravastatin sodium) .Marland Kitchen... Take one tablet daily to lower cholesterol.  Labs Reviewed: SGOT: 15 (05/19/2010)   SGPT: 12 (05/19/2010)   HDL:72 (05/19/2010), 71 (12/30/2009)  LDL:97 (05/19/2010), 95 (12/30/2009)  Chol:189 (05/19/2010), 193 (12/30/2009)  Trig:100 (05/19/2010), 136 (12/30/2009)  Problem # 4:  HEMATOCHEZIA (ICD-578.1) Patient reportedly had a normal colonoscopy by Dr. Marina Goodell in 2006.  The BRBPR is in small amounts only, but since it has  been 5 years since last colonoscopy, will refer to Dr. Marina Goodell for consideration of repeat colonoscopy.   Orders: Gastroenterology Referral (GI)  Problem # 5:  URINARY FREQUENCY (ICD-788.41) Urine culture at time of last visit was negative.  Given persistent symptoms, will repeat U/A and culture.  Orders: T-Urinalysis ([id number]) T-Culture, Urine ([id number])  Problem # 6:  ? of CARPAL TUNNEL SYNDROME (ICD-354.0) Pain has intermittent pain in left hand and arm, relieved in the past by a wrist splint for presumed CTS.  I advised patient to wear the wrist splint at night, and to let me know if her symptoms persist or worsen.  Complete Medication List: 1)  Lyrica 150 Mg Caps (Pregabalin) .... Take 1 capsule by mouth four times a day 2)  Effexor Xr 150 Mg Cp24 (Venlafaxine hcl) .... Take 2 capsules by mouth every morning 3)  Klonopin 0.5 Mg Tabs (Clonazepam) .... Take 1 tablet by mouth once a day as needed 4)  Pravachol 20 Mg Tabs (Pravastatin sodium) .... Take  one tablet daily to lower cholesterol. 5)  Taztia Xt 240 Mg Xr24h-cap (Diltiazem hcl er beads) .... Take 1 tablet by mouth once a day 6)  Enalapril Maleate 10 Mg Tabs (Enalapril maleate) .... Take 1 tablet by mouth once a day 7)  Levothyroxine Sodium 88 Mcg Tabs (Levothyroxine sodium) .... Take 1 tablet by mouth once a day 8)  Proventil Hfa 108 (90 Base) Mcg/act Aers (Albuterol sulfate) .... Inhale 2 puffs four times a day as needed 9)  Aspirin 81 Mg Tbec (Aspirin) .... Take 1 tablet by mouth once a day 10)  Oxycodone-acetaminophen 5-500 Mg Caps (Oxycodone-acetaminophen) .... Take 1 tablet by mouth every 6 hours as needed for pain 11)  Flonase 50 Mcg/act Susp (Fluticasone propionate) .... Take 2 sprays in each nostril once daily 12)  Imitrex 50 Mg Tabs (Sumatriptan succinate) .... Take 1 tablet by mouth at onset of headache as directed 13)  Prilosec 20 Mg Cpdr (Omeprazole) .... Take 2 capsules by mouth once a day 14)  Hyomax-sl 0.125 Mg Subl (Hyoscyamine sulfate) .... Dissolve 1 to 2 tablets under the tongue (or by mouth) every 4 hours as needed for chest pain. 15)  Calcium Carbonate-vitamin D 600-400 Mg-unit Tabs (Calcium carbonate-vitamin d) .... Take 1 tablet by mouth once a day 16)  Colace 100 Mg Caps (Docusate sodium) .... Take 2 capsules by mouth once a day  Patient Instructions: 1)  Please schedule a follow-up appointment in 3 months.  Prescriptions: HYOMAX-SL 0.125 MG  SUBL (HYOSCYAMINE SULFATE) Dissolve 1 to 2 tablets under the tongue (or by mouth) every 4 hours as needed for chest pain.  #30 x 1   Entered and Authorized by:   Margarito Liner MD   Signed by:   Margarito Liner MD on 07/28/2010   Method used:   Electronically to        [facility]  [phone]* (retail)       [address]       [place], [place]  [address]       Ph: [phone] or [phone]       Fax: [phone]   RxID:   [id number]    Orders Added: 1)  T-Hgb A1C (in-house)  [[id number]] 2)  T- Capillary Blood Glucose [82948] 3)  T-Urinalysis [81003-65000] 4)  T-Culture, Urine [[id number]] 5)  Gastroenterology Referral [GI] 6)  Est. Patient Level III [53299]    Prevention & Chronic Care Immunizations  Influenza vaccine: Historical  (04/10/2010)   Influenza vaccine deferral: Deferred  (12/30/2009)   Influenza vaccine due: 03/11/2010    Tetanus booster: 06/30/2004: Historical   Tetanus booster due: 06/30/2014    Pneumococcal vaccine: Pneumovax (Medicare)  (05/14/2008)    H. zoster vaccine: Not documented   H. zoster vaccine deferral: Not indicated  (11/05/2009)  Colorectal Screening   Hemoccult: negative x 3  (02/25/2009)   Hemoccult action/deferral: Ordered  (10/26/2009)   Hemoccult due: 03/2010    Colonoscopy: Results: Normal. Procedure performed by Dr. Yancey Flemings.  (02/14/2005)   Colonoscopy due: 02/15/2015  Other Screening   Pap smear: Not documented   Pap smear action/deferral: Not indicated S/P hysterectomy  (01/27/2009)    Mammogram: ASSESSMENT: Negative - BI-RADS 1^MM DIGITAL SCREENING  (12/02/2009)   Mammogram action/deferral: Deferred-2 yr interval  (11/26/2009)   Mammogram due: 11/10/2009    DXA bone density scan: Not documented   Smoking status: never  (07/28/2010)  Diabetes Mellitus   HgbA1C: 5.5  (07/28/2010)   HgbA1C action/deferral: Ordered  (06/02/2009)   Hemoglobin A1C due: 01/07/2010    Eye exam: Mild background diabetic retinopathy OS and OD. Exam at [facility].   (09/03/2009)   Diabetic eye exam action/deferral: Ophthalmology referral  (07/29/2009)   Eye exam due: 09/2010    Foot exam: yes  (05/19/2010)   Foot exam action/deferral: Do today   High risk foot: No  (05/19/2010)   Foot care education: Done  (05/19/2010)   Foot exam due: 05/20/2011    Urine microalbumin/creatinine ratio: 9.0  (05/19/2010)   Urine microalbumin action/deferral: Ordered   Urine microalbumin/cr due: 01/27/2010     Diabetes flowsheet reviewed?: Yes   Progress toward A1C goal: At goal  Lipids   Total Cholesterol: 189  (05/19/2010)   Lipid panel action/deferral: Lipid Panel ordered   LDL: 97  (05/19/2010)   LDL Direct: Not documented   HDL: 72  (05/19/2010)   Triglycerides: 100  (05/19/2010)   Lipid panel due: 01/27/2010    SGOT (AST): 15  (05/19/2010)   BMP action: Ordered   SGPT (ALT): 12  (05/19/2010)   Alkaline phosphatase: 99  (05/19/2010)   Total bilirubin: 0.3  (05/19/2010)    Lipid flowsheet reviewed?: Yes   Progress toward LDL goal: At goal  Hypertension   Last Blood Pressure: 149 / 82  (07/28/2010)   Serum creatinine: 0.73  (05/19/2010)   BMP action: Ordered   Serum potassium 4.4  (05/19/2010)   Basic metabolic panel due: 04/09/2010    Hypertension flowsheet reviewed?: Yes   Progress toward BP goal: Unchanged  Self-Management Support :   Personal Goals (by the next clinic visit) :     Personal A1C goal: 7  (01/27/2009)     Personal blood pressure goal: 130/80  (01/27/2009)     Personal LDL goal: 100  (01/27/2009)    Patient will work on the following items until the next clinic visit to reach self-care goals:     Medications and monitoring: take my medicines every day, check my blood sugar, examine my feet every day  (07/28/2010)     Eating: eat more vegetables, use fresh or frozen vegetables, eat foods that are low in salt, eat baked foods instead of fried foods, eat fruit for snacks and desserts  (07/28/2010)     Activity: take a 30 minute walk every day  (05/19/2010)     Home glucose monitoring frequency: 1 time daily  (01/27/2009)    Diabetes self-management support: Education  handout, Written self-care plan  (07/28/2010)   Diabetes care plan printed   Diabetes education handout printed    Diabetes self-management support not done because: Good outcomes  (01/27/2009)    Hypertension self-management support: Education handout, Written self-care plan  (07/28/2010)    Hypertension self-care plan printed.   Hypertension education handout printed    Lipid self-management support: Education handout, Written self-care plan  (07/28/2010)   Lipid self-care plan printed.   Lipid education handout printed   Process Orders Check Orders Results:     [facility]: ABN not required for this insurance Tests Sent for requisitioning (July 31, 2010 3:31 PM):     07/28/2010: [facility] -- T-Urinalysis [[id number]] (signed)     07/28/2010: [facility] -- T-Culture, Urine [[id number]] (signed)     Laboratory Results   Blood Tests   Date/Time Received: July 28, 2010 10:50 AM Date/Time Reported: Alric Quan  July 28, 2010 10:50 AM   HGBA1C: 5.5%   (Normal Range: Non-Diabetic - 3-6%   Control Diabetic - 6-8%) CBG Random:: 97mg /dL  Comments: per patient black coffee only today  Alric Quan  July 28, 2010 10:50 AM

## 2010-08-12 NOTE — Consult Note (Signed)
Summary: [employer]  [employer]   Imported By: Florinda Marker 09/08/2009 09:15:05  _____________________________________________________________________  External Attachment:    Type:   Image     Comment:   External Document  Appended Document: [facility].   Diabetic Eye Exam  Procedure date:  09/03/2009  Findings:      Mild background diabetic retinopathy OS and OD. Exam at [facility].   Procedures Next Due Date:    Diabetic Eye Exam: 09/2010

## 2010-08-12 NOTE — Progress Notes (Signed)
Summary: phone/gg  Phone Note Call from Patient   Caller: Patient Summary of Call: Pt called stating she is having side effect form her change in med.   2 nights ago she had some confusion and numbness around mouth.  She went to bed and improved in 4 hours. This has been going on for several months.  Please advise Initial call taken by: Merrie Roof RN,  Nov 18, 2009 4:41 PM  Follow-up for Phone Call        Appointment scheduled for 5/19 Pt advised to go to ED if symptoms return Follow-up by: Merrie Roof RN,  Nov 18, 2009 4:49 PM

## 2010-08-12 NOTE — Letter (Signed)
Summary: [facility]   Imported By: Florinda Marker 07/29/2009 15:56:16  _____________________________________________________________________  External Attachment:    Type:   Image     Comment:   External Document

## 2010-08-12 NOTE — Progress Notes (Signed)
Summary: test strips/ hla  Phone Note Call from Patient   Summary of Call: evidently pt is calling for test strips, i do not see test strips on her medication list, i have tried to call pt and find out what meter she uses but could not reach her, left message to return call Initial call taken by: Marin Roberts RN,  May 25, 2010 3:59 PM  Follow-up for Phone Call        Pt returned call and is not in need of anything. Follow-up by: Merrie Roof RN,  May 26, 2010 11:33 AM

## 2010-08-12 NOTE — Progress Notes (Signed)
Summary: no bm in 1 wk/ hla  Phone Note Call from Patient   Summary of Call: pt calls to say she had to take barium 4/22 and now it has been a week since she had a bowel movement, denies pain but does have pressure and "stuffed" feeling. she is having normal meals. she has taken clear lax, citrucel and 2 glycerin supp w/ no results, she is getting very worried. please advise.  [phone] Initial call taken by: Marin Roberts RN,  November 03, 2009 4:38 PM  Follow-up for Phone Call        Can you please ask her to increase her fluid intake and increase her fiber intake (fruits, vegg, whole grains).  She can repeat any of the laxatives (like MOM or Phillips) or enemas.  Please make her appt Thur or Friday just in case she hasn't been able to have a BM (she can cancel it if the problem resolves).    Follow-up by: Blanch Media MD,  November 03, 2009 4:54 PM  Additional Follow-up for Phone Call Additional follow up Details #1::        Will see her today if she comes in and evaluate.  thanks  Additional Follow-up by: Mliss Sax MD,  November 05, 2009 9:20 AM

## 2010-08-12 NOTE — Progress Notes (Signed)
Summary: Pain in fingers  Phone Note Call from Patient   Caller: Patient Call For: Margarito Liner MD Summary of Call: Call from pt said that she has pain that starts in her thumband between index finger.  Pain then goes up her arm then right under breast and arm  is on the left side.   Pain is a level of 7-7.  Pain meds donot make it go away.  Is able to use her hand.  Swelling at night.  Goes away when she gets up and moves around.  Comes back when she gets hyper.   Gets anxious.  When she moves fast she feels like she is going side to side. Talking is off a little bit. Had tests done in the hosp[facility].  Having chills prior to bowel movement.  Had blood in her stool the other days.  ? hemmorrhoid that is bleeding.  Has stopped the bleeding.  Said she was not straining.  Constipation- size of golf balls.  Having  more since diet change.  Was unable to schedule a follow up appointment in 2 weeks. Initial call taken by: Angelina Ok RN,  June 28, 2010 3:59 PM  Follow-up for Phone Call        I would advise urgent care clinic for the acute issues, and please schedule in [facility] as soon as an appointment is available. Follow-up by: Margarito Liner MD,  June 28, 2010 4:39 PM  Additional Follow-up for Phone Call Additional follow up Details #1::        RTC to pt advised to go to Urgent Care for her acute problems.  Pt to go to Urgent Care for immediate problems.  Pt was given an appointment in the Clinics for 05/29/2011 at 10:15 AM.  Additional Follow-up by: Angelina Ok RN,  June 29, 2010 4:09 PM    Additional Follow-up for Phone Call Additional follow up Details #2::    noted. Follow-up by: Julaine Fusi  DO,  June 29, 2010 4:34 PM

## 2010-08-19 ENCOUNTER — Telehealth: Payer: Self-pay | Admitting: *Deleted

## 2010-08-19 ENCOUNTER — Inpatient Hospital Stay (INDEPENDENT_AMBULATORY_CARE_PROVIDER_SITE_OTHER)
Admission: RE | Admit: 2010-08-19 | Discharge: 2010-08-19 | Disposition: A | Discharge status: Home / Self Care | Payer: No Typology Code available for payment source | Source: Ambulatory Visit | Attending: Family Medicine | Admitting: Family Medicine

## 2010-08-19 DIAGNOSIS — M799 Soft tissue disorder, unspecified: Secondary | ICD-10-CM

## 2010-08-19 NOTE — Telephone Encounter (Signed)
Pt called with c/o pain to rt side. She states she told Dr Meredith Pel  about this lower right side pain on last two visits .  Now pain is now moved up to  mid section of abdomin and stays in this area all the time.  Worse when laying on side.  Pain meds not helping. This has been going on for a week.  She notes that after a BM she has some relief but pain comes back after eating.  She rates pain 8/10 She would like a call back. She sees Dr Marina Goodell (GI)  next month. We have no open slots

## 2010-08-19 NOTE — Telephone Encounter (Signed)
Pt informed and will go to [facility] for evaluation 

## 2010-08-19 NOTE — Telephone Encounter (Signed)
Would advise that she go to urgent care for evaluation.

## 2010-08-23 ENCOUNTER — Other Ambulatory Visit: Payer: Self-pay | Admitting: Internal Medicine

## 2010-08-23 ENCOUNTER — Encounter: Payer: Self-pay | Admitting: Internal Medicine

## 2010-08-23 NOTE — Telephone Encounter (Signed)
Last seen 1/12.  Last BMP 11/11. Already has appt in few months. Refill appropriate

## 2010-08-25 ENCOUNTER — Telehealth: Payer: Self-pay | Admitting: *Deleted

## 2010-08-25 NOTE — Telephone Encounter (Signed)
I would advise that she be seen; if we have no appointments, then urgent care is the best option.

## 2010-08-25 NOTE — Telephone Encounter (Signed)
Pt calls and states since fri she has a very tight congested cough, productive but clear, she states she can hear herself wheezing, she went to urg care on fri and mentioned it to md she saw there and the md stated that "they wouldn't be doing anything for it at that visit" she has progressively gotten worse cough is very congested. Denies fever. Able to eat and drink. Positive for h/a. States it's hard to talk and she gets short of breath easily. Please advise.

## 2010-08-26 NOTE — Telephone Encounter (Signed)
i called pt back 2/15 and she said she was going to [facility]

## 2010-09-01 ENCOUNTER — Emergency Department (HOSPITAL_COMMUNITY): Payer: Medicare Other

## 2010-09-01 ENCOUNTER — Emergency Department (HOSPITAL_COMMUNITY)
Admission: EM | Admit: 2010-09-01 | Discharge: 2010-09-01 | Disposition: A | Discharge status: Home / Self Care | Payer: Medicare Other | Attending: Emergency Medicine | Admitting: Emergency Medicine

## 2010-09-01 ENCOUNTER — Encounter: Payer: Self-pay | Admitting: Cardiology

## 2010-09-01 DIAGNOSIS — Z79899 Other long term (current) drug therapy: Secondary | ICD-10-CM | POA: Insufficient documentation

## 2010-09-01 DIAGNOSIS — J40 Bronchitis, not specified as acute or chronic: Secondary | ICD-10-CM | POA: Insufficient documentation

## 2010-09-01 DIAGNOSIS — I10 Essential (primary) hypertension: Secondary | ICD-10-CM | POA: Insufficient documentation

## 2010-09-01 DIAGNOSIS — M1990 Unspecified osteoarthritis, unspecified site: Secondary | ICD-10-CM | POA: Insufficient documentation

## 2010-09-01 DIAGNOSIS — M069 Rheumatoid arthritis, unspecified: Secondary | ICD-10-CM | POA: Insufficient documentation

## 2010-09-01 DIAGNOSIS — R059 Cough, unspecified: Secondary | ICD-10-CM | POA: Insufficient documentation

## 2010-09-01 DIAGNOSIS — K219 Gastro-esophageal reflux disease without esophagitis: Secondary | ICD-10-CM | POA: Insufficient documentation

## 2010-09-01 DIAGNOSIS — R05 Cough: Secondary | ICD-10-CM | POA: Insufficient documentation

## 2010-09-01 DIAGNOSIS — IMO0001 Reserved for inherently not codable concepts without codable children: Secondary | ICD-10-CM | POA: Insufficient documentation

## 2010-09-03 ENCOUNTER — Ambulatory Visit: Payer: Self-pay | Admitting: Cardiology

## 2010-09-04 ENCOUNTER — Other Ambulatory Visit: Payer: Self-pay | Admitting: Internal Medicine

## 2010-09-04 ENCOUNTER — Encounter: Payer: Self-pay | Admitting: Internal Medicine

## 2010-09-07 ENCOUNTER — Ambulatory Visit: Payer: Medicare Other | Admitting: Physical Medicine & Rehabilitation

## 2010-09-07 ENCOUNTER — Encounter: Payer: Medicare Other | Attending: Physical Medicine & Rehabilitation

## 2010-09-07 DIAGNOSIS — M19019 Primary osteoarthritis, unspecified shoulder: Secondary | ICD-10-CM

## 2010-09-07 DIAGNOSIS — M51379 Other intervertebral disc degeneration, lumbosacral region without mention of lumbar back pain or lower extremity pain: Secondary | ICD-10-CM | POA: Insufficient documentation

## 2010-09-07 DIAGNOSIS — M545 Low back pain, unspecified: Secondary | ICD-10-CM | POA: Insufficient documentation

## 2010-09-07 DIAGNOSIS — R269 Unspecified abnormalities of gait and mobility: Secondary | ICD-10-CM | POA: Insufficient documentation

## 2010-09-07 DIAGNOSIS — IMO0001 Reserved for inherently not codable concepts without codable children: Secondary | ICD-10-CM | POA: Insufficient documentation

## 2010-09-07 DIAGNOSIS — M79609 Pain in unspecified limb: Secondary | ICD-10-CM | POA: Insufficient documentation

## 2010-09-07 DIAGNOSIS — M546 Pain in thoracic spine: Secondary | ICD-10-CM | POA: Insufficient documentation

## 2010-09-07 DIAGNOSIS — F329 Major depressive disorder, single episode, unspecified: Secondary | ICD-10-CM

## 2010-09-07 DIAGNOSIS — M5137 Other intervertebral disc degeneration, lumbosacral region: Secondary | ICD-10-CM | POA: Insufficient documentation

## 2010-09-07 DIAGNOSIS — F341 Dysthymic disorder: Secondary | ICD-10-CM | POA: Insufficient documentation

## 2010-09-07 DIAGNOSIS — H8110 Benign paroxysmal vertigo, unspecified ear: Secondary | ICD-10-CM

## 2010-09-07 DIAGNOSIS — M159 Polyosteoarthritis, unspecified: Secondary | ICD-10-CM | POA: Insufficient documentation

## 2010-09-07 DIAGNOSIS — M25559 Pain in unspecified hip: Secondary | ICD-10-CM | POA: Insufficient documentation

## 2010-09-07 DIAGNOSIS — G8929 Other chronic pain: Secondary | ICD-10-CM | POA: Insufficient documentation

## 2010-09-07 DIAGNOSIS — R109 Unspecified abdominal pain: Secondary | ICD-10-CM | POA: Insufficient documentation

## 2010-09-07 NOTE — Miscellaneous (Signed)
Summary: phone call  Patient called. States that was seen at [facility] ED 2 days ago ->CXR neg; "was diagnosed with a bronchospam." Was given loratidine and instructed to use Albuterol HFA with a spacer. Patient states that is feeling worse --no wheezing but has "terriable coughing spells." patient was instructed to go to ED. Rx for medrol dospeack was sent elecetronically to [facility].  Medications: Added new medication of MEDROL (PAK) 4 MG TABS (METHYLPREDNISOLONE) Take as directed - Signed Rx of MEDROL (PAK) 4 MG TABS (METHYLPREDNISOLONE) Take as directed;  #1 x 0;  Signed;  Entered by: Deatra Robinson MD;  Authorized by: Deatra Robinson MD;  Method used: Electronically to [facility] [place] [address] [phone]*, [address], [place], [place]  [address], Ph: [phone], Fax: [phone]    Prescriptions: MEDROL (PAK) 4 MG TABS (METHYLPREDNISOLONE) Take as directed  #1 x 0   Entered and Authorized by:   Deatra Robinson MD   Signed by:   Deatra Robinson MD on 09/04/2010   Method used:   Electronically to        [facility] [place] [address] [phone]* (retail)       [address] [place], [place]  [address]       Ph: [phone]       Fax: [phone]   RxID:   [id number]

## 2010-09-07 NOTE — Miscellaneous (Signed)
  Clinical Lists Changes  Observations: Added new observation of PAST MED HX: Diabetes mellitus, Type II TIA: evaluated in 2007 with MRI, had changes: ischemic versus demyelinating Anxiety GERD Hypertension Hypothyroidism Depression Allergic rhinitis Fibromyalgia Back pain Hemorrhoids Migaine Chest Pain  cath...2004..normal coronaries  /   myoview..02/2006...no ischemia (09/01/2010 16:46) Added new observation of PRIMARY MD: Margarito Liner MD (09/01/2010 16:46)       Past History:  Past Medical History: Diabetes mellitus, Type II TIA: evaluated in 2007 with MRI, had changes: ischemic versus demyelinating Anxiety GERD Hypertension Hypothyroidism Depression Allergic rhinitis Fibromyalgia Back pain Hemorrhoids Migaine Chest Pain  cath...2004..normal coronaries  /   myoview..02/2006...no ischemia

## 2010-09-14 ENCOUNTER — Encounter: Payer: Self-pay | Admitting: Internal Medicine

## 2010-09-14 ENCOUNTER — Ambulatory Visit (INDEPENDENT_AMBULATORY_CARE_PROVIDER_SITE_OTHER): Payer: Medicare Other | Admitting: Internal Medicine

## 2010-09-14 DIAGNOSIS — R198 Other specified symptoms and signs involving the digestive system and abdomen: Secondary | ICD-10-CM | POA: Insufficient documentation

## 2010-09-14 DIAGNOSIS — K625 Hemorrhage of anus and rectum: Secondary | ICD-10-CM

## 2010-09-14 DIAGNOSIS — K219 Gastro-esophageal reflux disease without esophagitis: Secondary | ICD-10-CM

## 2010-09-14 DIAGNOSIS — R1011 Right upper quadrant pain: Secondary | ICD-10-CM | POA: Insufficient documentation

## 2010-09-14 DIAGNOSIS — F1120 Opioid dependence, uncomplicated: Secondary | ICD-10-CM

## 2010-09-14 DIAGNOSIS — K224 Dyskinesia of esophagus: Secondary | ICD-10-CM | POA: Insufficient documentation

## 2010-09-16 NOTE — Procedures (Signed)
Summary: colonoscopy   Colonoscopy  Procedure date:  02/14/2005  Findings:      Location:  [facility].  Results: Normal.  Patient Name: [name], [name] MRN: [id number] Procedure Procedures: Colonoscopy CPT: 01601.  Personnel: Endoscopist: Wilhemina Bonito. Marina Goodell, MD.  Referred By: Ileana Roup, MD.  Exam Location: Exam performed in Endoscopy Suite.  Patient Consent: Procedure, Alternatives, Risks and Benefits discussed, consent obtained,  Indications Symptoms: alternating bowel habits.  Average Risk Screening Routine.  History  Current Medications: Patient is not currently taking Coumadin.  Pre-Exam Physical: Performed Feb 14, 2005. Entire physical exam was normal.  Exam Exam: Extent of exam reached: Cecum, extent intended: Cecum.  The cecum was identified by appendiceal orifice and IC valve. Patient position: on left side. Colon retroflexion performed. Images taken. ASA Classification: II. Tolerance: excellent.  Monitoring: Pulse and BP monitoring, Oximetry used. Supplemental O2 given.  Colon Prep Used Miralax for colon prep. Prep results: excellent.  Fluoroscopy: Fluoroscopy was not used.  Sedation Meds: Demerol 100 mg. given IV. Versed 10 mg. given IV.  Findings NORMAL EXAM: Cecum to Rectum.   Assessment Normal examination.  Comments: NO POLYPS SEEN Events  Unplanned Interventions: No intervention was required.  Unplanned Events: There were no complications. Plans Disposition: After procedure patient sent to recovery. After recovery patient sent home.  Comments: RETURN TO THE CARE OF DR. Meredith Pel  This report was created from the original endoscopy report, which was reviewed and signed by the above listed endoscopist.   cc:  Margarito Liner, MD      The Patient

## 2010-09-18 ENCOUNTER — Encounter: Payer: Self-pay | Admitting: Cardiology

## 2010-09-20 ENCOUNTER — Other Ambulatory Visit: Payer: Self-pay | Admitting: Internal Medicine

## 2010-09-20 DIAGNOSIS — E039 Hypothyroidism, unspecified: Secondary | ICD-10-CM

## 2010-09-21 NOTE — Letter (Signed)
Summary: [facility] Instructions  [facility]  [address] [place], [place] [address]   Phone: [phone]  Fax: [phone]       [name] [name]    12-26-1945    MRN: [id number]        Procedure Day /Date:THURSDAY 10/21/10     Arrival Time:7:30 AM     Procedure Time:8:00 AM     Location of Procedure:                    X  [facility] (4th Floor)           PREPARATION FOR COLONOSCOPY WITH MOVIPREP WITH PROPOFUL   Starting 5 days prior to your procedure 10/16/10 do not eat nuts, seeds, popcorn, corn, beans, peas,  salads, or any raw vegetables.  Do not take any fiber supplements (e.g. Metamucil, Citrucel, and Benefiber).  THE DAY BEFORE YOUR PROCEDURE         DATE: 10/20/10  DAY: WEDNESDAY  1.  Drink clear liquids the entire day-NO SOLID FOOD  2.  Do not drink anything colored red or purple.  Avoid juices with pulp.  No orange juice.  3.  Drink at least 64 oz. (8 glasses) of fluid/clear liquids during the day to prevent dehydration and help the prep work efficiently.  CLEAR LIQUIDS INCLUDE: Water Jello Ice Popsicles Tea (sugar ok, no milk/cream) Powdered fruit flavored drinks Coffee (sugar ok, no milk/cream) Gatorade Juice: apple, white grape, white cranberry  Lemonade Clear bullion, consomm, broth Carbonated beverages (any kind) Strained chicken noodle soup Hard Candy                             4.  In the morning, mix first dose of MoviPrep solution:    Empty 1 Pouch A and 1 Pouch B into the disposable container    Add lukewarm drinking water to the top line of the container. Mix to dissolve    Refrigerate (mixed solution should be used within 24 hrs)  5.  Begin drinking the prep at 5:00 p.m. The MoviPrep container is divided by 4 marks.   Every 15 minutes drink the solution down to the next mark (approximately 8 oz) until the full liter is complete.   6.  Follow completed prep with 16 oz of clear liquid of your choice (Nothing red  or purple).  Continue to drink clear liquids until bedtime.  7.  Before going to bed, mix second dose of MoviPrep solution:    Empty 1 Pouch A and 1 Pouch B into the disposable container    Add lukewarm drinking water to the top line of the container. Mix to dissolve    Refrigerate  THE DAY OF YOUR PROCEDURE      DATE: 10/21/10 DAY: THURSDAY  Beginning at 3:00 a.m. (5 hours before procedure):         1. Every 15 minutes, drink the solution down to the next mark (approx 8 oz) until the full liter is complete.  2. Follow completed prep with 16 oz. of clear liquid of your choice.    3. You may drink clear liquids until 6:00 AM (2 HOURS BEFORE PROCEDURE).   MEDICATION INSTRUCTIONS  Unless otherwise instructed, you should take regular prescription medications with a small sip of water   as early as possible the morning of your procedure.         OTHER INSTRUCTIONS  You will  need a responsible adult at least 63 years of age to accompany you and drive you home.   This person must remain in the waiting room during your procedure.  Wear loose fitting clothing that is easily removed.  Leave jewelry and other valuables at home.  However, you may wish to bring a book to read or  an iPod/MP3 player to listen to music as you wait for your procedure to start.  Remove all body piercing jewelry and leave at home.  Total time from sign-in until discharge is approximately 2-3 hours.  You should go home directly after your procedure and rest.  You can resume normal activities the  day after your procedure.  The day of your procedure you should not:   Drive   Make legal decisions   Operate machinery   Drink alcohol   Return to work  You will receive specific instructions about eating, activities and medications before you leave.    The above instructions have been reviewed and explained to me by   _______________________    I fully understand and can verbalize these  instructions _____________________________ Date _________

## 2010-09-21 NOTE — Assessment & Plan Note (Signed)
Summary:  Rectal bleeding   History of Present Illness Visit Type: Initial Consult Primary GI MD: Yancey Flemings MD Primary Provider: Margarito Liner MD Requesting Provider: Margarito Liner MD Chief Complaint: Consult for Colonoscopy. C/o rectal bleeding and small calibur bowel movements History of Present Illness:    65 year old female with multiple medical problems including hypertension, hypothyroidism, fibromyalgia on chronic pain medication, anxiety/depression on medical therapy, GERD, diabetes mellitus, TIA, obesity, chronic back pain, esophageal dysmotility, chronic constipation. patient presents today after reporting to her primary care provider several episodes of rectal bleeding. she has had 3 episodes in the past one to 2 months. twice red blood, one start or blood. also, reports narrowing of stool caliber. complete colonoscopy performed last August 2006 was normal. no true abdominal pain. She does report right-sided discomfort most prominent when she sleeps on that side at night. she continues with  vague dysphasia unchanged from previous evaluation. For her GERD she is maintained on Prilosec 20 mg twice daily. Occasional breakthrough symptoms. No weight loss   GI Review of Systems    Reports abdominal pain.     Location of  Abdominal pain: RLQ.    Denies acid reflux, belching, bloating, chest pain, dysphagia with liquids, dysphagia with solids, heartburn, loss of appetite, nausea, vomiting, vomiting blood, weight loss, and  weight gain.      Reports change in bowel habits, constipation, diarrhea, hemorrhoids, and  rectal bleeding.     Denies anal fissure, black tarry stools, diverticulosis, fecal incontinence, heme positive stool, irritable bowel syndrome, jaundice, light color stool, liver problems, and  rectal pain. Preventive Screening-Counseling & Management  Alcohol-Tobacco     Smoking Status: never    Current Medications (verified): 1)  Lyrica 150 Mg Caps (Pregabalin) .... Take 1  Capsule By Mouth Four Times A Day 2)  Effexor Xr 150 Mg Cp24 (Venlafaxine Hcl) .... Take 2 Capsules By Mouth Every Morning 3)  Klonopin 0.5 Mg Tabs (Clonazepam) .... Take 1 Tablet By Mouth Once A Day As Needed 4)  Pravachol 20 Mg Tabs (Pravastatin Sodium) .... Take One Tablet Daily To Lower Cholesterol. 5)  Taztia Xt 240 Mg Xr24h-Cap (Diltiazem Hcl Er Beads) .... Take 1 Tablet By Mouth Once A Day 6)  Enalapril Maleate 10 Mg Tabs (Enalapril Maleate) .... Take 1 Tablet By Mouth Once A Day 7)  Levothyroxine Sodium 88 Mcg Tabs (Levothyroxine Sodium) .... Take 1 Tablet By Mouth Once A Day 8)  Proventil Hfa 108 (90 Base) Mcg/act Aers (Albuterol Sulfate) .... Inhale 2 Puffs Four Times A Day As Needed 9)  Aspirin 81 Mg Tbec (Aspirin) .... Take 1 Tablet By Mouth Once A Day 10)  Oxycodone-Acetaminophen 5-500 Mg Caps (Oxycodone-Acetaminophen) .... Take 1 Tablet By Mouth Every 6 Hours As Needed For Pain 11)  Flonase 50 Mcg/act  Susp (Fluticasone Propionate) .... Take 2 Sprays in Each Nostril Once Daily 12)  Imitrex 50 Mg Tabs (Sumatriptan Succinate) .... Take 1 Tablet By Mouth At Onset of Headache As Directed 13)  Prilosec 20 Mg Cpdr (Omeprazole) .... Take 2 Capsules By Mouth Once A Day 14)  Hyomax-Sl 0.125 Mg  Subl (Hyoscyamine Sulfate) .... Dissolve 1 To 2 Tablets Under The Tongue (Or By Mouth) Every 4 Hours As Needed For Chest Pain. 15)  Calcium Carbonate-Vitamin D 600-400 Mg-Unit  Tabs (Calcium Carbonate-Vitamin D) .... Take 1 Tablet By Mouth Once A Day 16)  Colace 100 Mg Caps (Docusate Sodium) .... Take 4 By Mouth At Bedtime 17)  Miralax  Powd (Polyethylene Glycol  3350) ..Marland KitchenMarland Kitchen 17 Gram At Bedtime  Allergies (verified): 1)  ! Penicillin V Potassium (Penicillin V Potassium) 2)  ! Nsaids 3)  ! Voltaren  Past History:  Past Medical History: Last updated: 09/01/2010 Diabetes mellitus, Type II TIA: evaluated in 2007 with MRI, had changes: ischemic versus  demyelinating Anxiety GERD Hypertension Hypothyroidism Depression Allergic rhinitis Fibromyalgia Back pain Hemorrhoids Migaine Chest Pain  cath...2004..normal coronaries  /   myoview..02/2006...no ischemia  Past Surgical History: Cholecystectomy Hysterectomy-1984. Breast Surgery Normal cardiac catheterization Thyroidectomy  Family History: Reviewed history from 01/16/2008 and no changes required. Grandmother had breast cancer. No colon cancer. Three uncles had lung cancer, all smoked. Sister died of leukemia at age 69 months. No ovarian cancer. Family History Diabetes 1st degree relative, mother and father. Father had CAD in his late 62s.  Social History: Married. Alcohol use-no Drug use-no Regular exercise-no Patient has never smoked.   Review of Systems       The patient complains of anxiety-new, depression-new, fatigue, shortness of breath, and sleeping problems.  The patient denies allergy/sinus, anemia, arthritis/joint pain, back pain, blood in urine, breast changes/lumps, change in vision, confusion, cough, coughing up blood, fainting, fever, headaches-new, hearing problems, heart murmur, heart rhythm changes, itching, menstrual pain, muscle pains/cramps, night sweats, nosebleeds, pregnancy symptoms, skin rash, sore throat, swelling of feet/legs, swollen lymph glands, thirst - excessive , urination - excessive , urination changes/pain, urine leakage, vision changes, and voice change.    Vital Signs:  Patient profile:   65 year old female Height:      67 inches Weight:      197 pounds BMI:     30.97 BSA:     2.01 Pulse rate:   84 / minute Pulse rhythm:   regular BP sitting:   112 / 78  (left arm)  Vitals Entered By: Merri Ray CMA Duncan Dull) (September 14, 2010 10:23 AM)  Physical Exam  General:  Well developed, well nourished, no acute distress. Head:  Normocephalic and atraumatic. Eyes:  PERRLA, no icterus. Mouth:  No deformity or lesions Neck:  Supple;  no masses or thyromegaly. Anterior scar from prior. Lungs:  Clear throughout to auscultation. Heart:  Regular rate and rhythm; no murmurs, rubs,  or bruits. Abdomen:  Soft,  obese,nontender and nondistended. No masses, hepatosplenomegaly or hernias noted. Normal bowel sounds. Rectal:   deferred until colonoscopy Msk:  Symmetrical with no gross deformities. Normal posture. Pulses:  Normal pulses noted. Extremities:   no edema Neurologic:  Alert and  oriented x4 Skin:  Intact without significant lesions or rashes. Psych:  Alert and cooperative. Normal mood and affect.   Impression & Recommendations:  Problem # 1:  RECTAL BLEEDING (ICD-569.3)  intermittent rectal bleeding and change in stool caliber as described. Negative colonoscopy in 2006. needs further evaluation at this time.  Plan  #1. Colonoscopy. the nature of the procedure as well as the risks, benefits, and alternatives were reviewed. she understood and agreed to proceed. movi prep prescribed. the patient instructed on  its use  Problem # 2:  CHANGE IN BOWELS (ICD-787.99)  please see above  Problem # 3:  ABDOMINAL PAIN RIGHT UPPER QUADRANT (ICD-789.01)  musculoskeletal-type discomfort  Problem # 4:  GERD (ICD-530.81)  continue reflux precautions with attention to weight loss and lowest dose of PPI required to control symptoms  Problem # 5:  DYSPHAGIA (ICD-787.29)  chronic dysphagia with prior negative upper endoscopy and esophageal motility demonstrated nonspecific motility disorder. no further recommendations or plans. discussed this  with the patient and her daughter  Other Orders: Colonoscopy (Colon)  Patient Instructions: 1)  Colonoscopy with Propoful [facility] 10/21/10 8:00 am arrive at 7:30 am 2)  Movi prep instructions given 3)  Movi prep prescription sent to your pharmacy for you to pick up. 4)  Colonoscopy and Flexible Sigmoidoscopy brochure given.  5)  Copy sent to : Margarito Liner MD 6)  The medication list was  reviewed and reconciled.  All changed / newly prescribed medications were explained.  A complete medication list was provided to the patient / caregiver. Prescriptions: MOVIPREP 100 GM  SOLR (PEG-KCL-NACL-NASULF-NA ASC-C) As per prep instructions.  #1 x 0   Entered by:   Milford Cage NCMA   Authorized by:   Hilarie Fredrickson MD   Signed by:   Milford Cage NCMA on 09/14/2010   Method used:   Electronically to        [facility]  [phone]* (retail)       [address]       [place], [place]  [address]       Ph: [phone] or [phone]       Fax: [phone]   RxID:   [id number]   Appended Document:  Rectal bleeding  ADDENDUM. Given the patient's anxiety state, chronic anxiolytics, as well as chronic pain syndrome with chronic narcotics, we have elected to sedate her via MAC with propofol. I discussed this with her and her daughter. They are quite agreeable.

## 2010-09-22 LAB — GLUCOSE, CAPILLARY: Glucose-Capillary: 97 mg/dL (ref 70–99)

## 2010-09-27 LAB — GLUCOSE, CAPILLARY

## 2010-10-01 ENCOUNTER — Ambulatory Visit (INDEPENDENT_AMBULATORY_CARE_PROVIDER_SITE_OTHER): Payer: Medicare Other | Admitting: Cardiology

## 2010-10-01 ENCOUNTER — Encounter: Payer: Self-pay | Admitting: Cardiology

## 2010-10-01 DIAGNOSIS — E039 Hypothyroidism, unspecified: Secondary | ICD-10-CM | POA: Insufficient documentation

## 2010-10-01 DIAGNOSIS — R0602 Shortness of breath: Secondary | ICD-10-CM

## 2010-10-01 DIAGNOSIS — R079 Chest pain, unspecified: Secondary | ICD-10-CM

## 2010-10-01 DIAGNOSIS — I10 Essential (primary) hypertension: Secondary | ICD-10-CM

## 2010-10-01 DIAGNOSIS — M797 Fibromyalgia: Secondary | ICD-10-CM | POA: Insufficient documentation

## 2010-10-01 DIAGNOSIS — F419 Anxiety disorder, unspecified: Secondary | ICD-10-CM | POA: Insufficient documentation

## 2010-10-01 DIAGNOSIS — E7800 Pure hypercholesterolemia, unspecified: Secondary | ICD-10-CM

## 2010-10-01 DIAGNOSIS — G43909 Migraine, unspecified, not intractable, without status migrainosus: Secondary | ICD-10-CM | POA: Insufficient documentation

## 2010-10-01 DIAGNOSIS — E119 Type 2 diabetes mellitus without complications: Secondary | ICD-10-CM | POA: Insufficient documentation

## 2010-10-01 DIAGNOSIS — M2550 Pain in unspecified joint: Secondary | ICD-10-CM | POA: Insufficient documentation

## 2010-10-01 NOTE — Assessment & Plan Note (Signed)
Patient is not having any significant chest pain at this time.  She does not need exercise testing.

## 2010-10-01 NOTE — Assessment & Plan Note (Signed)
At this point I feel that her shortness of breath is not cardiac in origin.  No further workup is indicated.

## 2010-10-01 NOTE — Assessment & Plan Note (Signed)
Blood pressure is controlled. No change in therapy. 

## 2010-10-01 NOTE — Assessment & Plan Note (Signed)
Thyroid is treated.  No change in meds.

## 2010-10-01 NOTE — Assessment & Plan Note (Signed)
The patient is on Pravachol.  Because of the diffuse joint pain I suggested that she hold her Pravachol for a few weeks to see how she does.  I explained usually pain from statins is muscle pain

## 2010-10-01 NOTE — Progress Notes (Signed)
HPI Patient is seen for evaluation of shortness of breath.  She is self referred at this time.  She had a recent upper respiratory infection.  She's had some shortness of breath associated with this but she is improving now.  She's not having any chest pain.  She has undergone cardiac evaluation in the past.  Catheterization in 2004 revealed normal coronaries.  Myoview in 2007 revealed no ischemia. Allergies  Allergen Reactions  . Diclofenac Sodium   . Nsaids     REACTION: Throat swelling  . Penicillins     REACTION: RASH, TONGUE SWELLING    Current Outpatient Prescriptions  Medication Sig Dispense Refill  . albuterol (PROVENTIL,VENTOLIN) 90 MCG/ACT inhaler Inhale 2 puffs into the lungs 4 (four) times daily as needed.        Marland Kitchen aspirin 81 MG tablet Take 81 mg by mouth daily.        . Calcium Carbonate-Vit D-Min 600-400 MG-UNIT TABS Take 1 tablet by mouth daily.        . clonazePAM (KLONOPIN) 0.5 MG tablet Take 0.5 mg by mouth daily as needed.        . diltiazem (TIAZAC) 240 MG 24 hr capsule Take 240 mg by mouth daily.        Marland Kitchen docusate sodium (COLACE) 100 MG capsule Take 200 mg by mouth daily.        . enalapril (VASOTEC) 10 MG tablet TAKE ONE TABLET BY MOUTH EVERY DAY  30 tablet  5  . fluticasone (FLONASE) 50 MCG/ACT nasal spray 2 sprays by Nasal route daily.        . Hydrocodone-Acetaminophen (VICODIN PO) prn       . hyoscyamine (LEVSIN SL) 0.125 MG SL tablet Place 0.125 mg under the tongue every 4 (four) hours as needed. May use two tablets under tongue if pain not resolved by one.       . levothyroxine (SYNTHROID, LEVOTHROID) 88 MCG tablet Take 1 tablet (88 mcg total) by mouth daily.  31 tablet  3  . metFORMIN (GLUMETZA) 500 MG (MOD) 24 hr tablet Take 500 mg by mouth 2 (two) times daily.        Marland Kitchen omeprazole (PRILOSEC) 20 MG capsule 2 tabs po qd      . pravastatin (PRAVACHOL) 20 MG tablet Take 20 mg by mouth daily.        . pregabalin (LYRICA) 150 MG capsule Take 150 mg by mouth 4 (four)  times daily.        . SUMAtriptan (IMITREX) 50 MG tablet Take 50 mg by mouth daily as needed. Take one tablet by mouth at onset of headache as directed       . venlafaxine (EFFEXOR-XR) 150 MG 24 hr capsule daily. 2 tabs po qd      . DISCONTD: oxycodone-acetaminophen (ROXICET) 5-500 MG per tablet Take 1 tablet by mouth every 6 (six) hours as needed.          History   Social History  . Marital Status: Married    Spouse Name: N/A    Number of Children: N/A  . Years of Education: N/A   Occupational History  . Not on file.   Social History Main Topics  . Smoking status: Never Smoker   . Smokeless tobacco: Not on file  . Alcohol Use: No  . Drug Use: No  . Sexually Active: Not on file   Other Topics Concern  . Not on file   Social History Narrative  . No  narrative on file    Family History  Problem Relation Age of Onset  . Cancer      breast -- grandmother  . Cancer      lung -- uncles (3)  . Leukemia Sister   . Diabetes Father   . Diabetes Mother   . Coronary artery disease Father     Past Medical History  Diagnosis Date  . DM type 2 (diabetes mellitus, type 2)   . TIA (transient ischemic attack)   . Anxiety   . GERD (gastroesophageal reflux disease)   . HTN (hypertension)   . Hypothyroidism   . Depression   . Allergic rhinitis   . Fibromyalgia   . Back pain   . Hemorrhoids   . Migraine headache   . Chest pain     Past Surgical History  Procedure Date  . Cholecystectomy   . Abdominal hysterectomy 1984  . Breast surgery   . Cardiac catheterization     ROS  Patient denies fever, chills, headache, sweats, rash, change in vision, change in hearing, chest pain, cough, nausea vomiting, urinary symptoms.  All other systems are reviewed and are negative PHYSICAL EXAM Patient is stable.  She is oriented to person time and place.  Affect is normal.  Head is atraumatic.  There is no xanthelasma.  There no carotid bruits.  His no jugular venous distention.  Lungs  are clear.  Respiratory effort is not labored.  Cardiac exam reveals a swollen S2.  There are no clicks or significant murmurs.  The abdomen is soft.  There is no peripheral edema.  There no musculoskeletal deformities.  There no skin rashes. Filed Vitals:   10/01/10 1617  BP: 122/80  Pulse: 72  Resp: 14  Height: 5\' 6"  (1.676 m)  Weight: 196 lb (88.905 kg)    EKG  EKG is reviewed by me and done today.  It is normal. ASSESSMENT & PLAN

## 2010-10-04 LAB — GLUCOSE, CAPILLARY: Glucose-Capillary: 103 mg/dL — ABNORMAL HIGH (ref 70–99)

## 2010-10-15 LAB — GLUCOSE, CAPILLARY: Glucose-Capillary: 86 mg/dL (ref 70–99)

## 2010-10-16 LAB — URINALYSIS, ROUTINE W REFLEX MICROSCOPIC
Glucose, UA: NEGATIVE mg/dL
Hgb urine dipstick: NEGATIVE
Protein, ur: NEGATIVE mg/dL
Specific Gravity, Urine: 1.01 (ref 1.005–1.030)
pH: 6.5 (ref 5.0–8.0)

## 2010-10-16 LAB — GLUCOSE, CAPILLARY
Glucose-Capillary: 100 mg/dL — ABNORMAL HIGH (ref 70–99)
Glucose-Capillary: 82 mg/dL (ref 70–99)
Glucose-Capillary: 85 mg/dL (ref 70–99)
Glucose-Capillary: 86 mg/dL (ref 70–99)
Glucose-Capillary: 96 mg/dL (ref 70–99)
Glucose-Capillary: 97 mg/dL (ref 70–99)

## 2010-10-16 LAB — CBC
MCHC: 34.3 g/dL (ref 30.0–36.0)
Platelets: 272 10*3/uL (ref 150–400)
Platelets: 312 10*3/uL (ref 150–400)
RDW: 13.7 % (ref 11.5–15.5)
RDW: 14.1 % (ref 11.5–15.5)
WBC: 5.5 10*3/uL (ref 4.0–10.5)

## 2010-10-16 LAB — DIFFERENTIAL
Basophils Absolute: 0 10*3/uL (ref 0.0–0.1)
Basophils Relative: 0 % (ref 0–1)
Eosinophils Relative: 1 % (ref 0–5)
Lymphocytes Relative: 34 % (ref 12–46)
Monocytes Absolute: 0.4 10*3/uL (ref 0.1–1.0)
Monocytes Relative: 8 % (ref 3–12)
Neutro Abs: 3.2 10*3/uL (ref 1.7–7.7)

## 2010-10-16 LAB — BASIC METABOLIC PANEL
CO2: 29 mEq/L (ref 19–32)
Calcium: 9.1 mg/dL (ref 8.4–10.5)
Creatinine, Ser: 0.69 mg/dL (ref 0.4–1.2)
GFR calc non Af Amer: >60 mL/min (ref >60)
Glucose, Bld: 82 mg/dL (ref 70–99)

## 2010-10-16 LAB — TSH: TSH: 5.059 u[IU]/mL — ABNORMAL HIGH (ref 0.350–4.500)

## 2010-10-16 LAB — COMPREHENSIVE METABOLIC PANEL
AST: 18 U/L (ref 0–37)
Albumin: 4.2 g/dL (ref 3.5–5.2)
Alkaline Phosphatase: 95 U/L (ref 39–117)
BUN: 4 mg/dL — ABNORMAL LOW (ref 6–23)
Chloride: 106 mEq/L (ref 96–112)
GFR calc Af Amer: >60 mL/min (ref >60)
Potassium: 4.2 mEq/L (ref 3.5–5.1)
Total Bilirubin: 0.4 mg/dL (ref 0.3–1.2)
Total Protein: 6.6 g/dL (ref 6.0–8.3)

## 2010-10-16 LAB — HEMOGLOBIN A1C: Mean Plasma Glucose: 114 mg/dL

## 2010-10-16 LAB — URINE MICROSCOPIC-ADD ON

## 2010-10-19 LAB — GLUCOSE, CAPILLARY: Glucose-Capillary: 77 mg/dL (ref 70–99)

## 2010-10-20 ENCOUNTER — Encounter: Payer: Self-pay | Admitting: Internal Medicine

## 2010-10-20 ENCOUNTER — Encounter: Payer: Self-pay | Admitting: *Deleted

## 2010-10-20 LAB — URINE MICROSCOPIC-ADD ON

## 2010-10-20 LAB — URINALYSIS, ROUTINE W REFLEX MICROSCOPIC
Bilirubin Urine: NEGATIVE
Ketones, ur: NEGATIVE mg/dL
Nitrite: NEGATIVE
Protein, ur: NEGATIVE mg/dL
pH: 6 (ref 5.0–8.0)

## 2010-10-21 ENCOUNTER — Ambulatory Visit (AMBULATORY_SURGERY_CENTER): Payer: Medicare Other | Admitting: Internal Medicine

## 2010-10-21 ENCOUNTER — Encounter: Payer: Self-pay | Admitting: Internal Medicine

## 2010-10-21 VITALS — BP 149/79 | HR 80 | Temp 97.1°F | Resp 19 | Ht 66.0 in | Wt 189.0 lb

## 2010-10-21 DIAGNOSIS — K625 Hemorrhage of anus and rectum: Secondary | ICD-10-CM

## 2010-10-21 DIAGNOSIS — R198 Other specified symptoms and signs involving the digestive system and abdomen: Secondary | ICD-10-CM

## 2010-10-21 DIAGNOSIS — K922 Gastrointestinal hemorrhage, unspecified: Secondary | ICD-10-CM

## 2010-10-21 LAB — GLUCOSE, CAPILLARY
Glucose-Capillary: 100 mg/dL — ABNORMAL HIGH (ref 70–99)
Glucose-Capillary: 90 mg/dL (ref 70–99)

## 2010-10-21 MED ORDER — SODIUM CHLORIDE 0.9 % IV SOLN: 500.0000 mL | INTRAVENOUS | Status: DC

## 2010-10-21 NOTE — Patient Instructions (Signed)
Refer to green and blue discharge instruction sheet.  Call [phone] after 5 pm for any problems or concerns.  [facility] Staff will attempt to contact you @ [phone] tomorrow morning, usually between 7:30 am and 8:15 am.  Your report was normal.  Repeat colonoscopy is advised by your physician in 10 years from now.

## 2010-10-22 ENCOUNTER — Telehealth: Payer: Self-pay | Admitting: *Deleted

## 2010-10-22 NOTE — Telephone Encounter (Signed)

## 2010-10-25 LAB — COMPREHENSIVE METABOLIC PANEL
ALT: 17 U/L (ref 0–35)
AST: 24 U/L (ref 0–37)
CO2: 25 mEq/L (ref 19–32)
Chloride: 109 mEq/L (ref 96–112)
GFR calc Af Amer: >60 mL/min (ref >60)
GFR calc non Af Amer: >60 mL/min (ref >60)
Potassium: 4 mEq/L (ref 3.5–5.1)
Sodium: 144 mEq/L (ref 135–145)
Total Bilirubin: 0.5 mg/dL (ref 0.3–1.2)

## 2010-10-25 LAB — CBC
HCT: 39.2 % (ref 36.0–46.0)
MCV: 90.4 fL (ref 78.0–100.0)
RBC: 4.33 MIL/uL (ref 3.87–5.11)
WBC: 9.3 10*3/uL (ref 4.0–10.5)

## 2010-10-25 LAB — URINALYSIS, ROUTINE W REFLEX MICROSCOPIC
Bilirubin Urine: NEGATIVE
Ketones, ur: 40 mg/dL — AB
Nitrite: NEGATIVE
Specific Gravity, Urine: 1.024 (ref 1.005–1.030)
Urobilinogen, UA: 0.2 mg/dL (ref 0.0–1.0)

## 2010-10-25 LAB — LIPASE, BLOOD: Lipase: 25 U/L (ref 11–59)

## 2010-10-25 LAB — DIFFERENTIAL
Basophils Absolute: 0 10*3/uL (ref 0.0–0.1)
Eosinophils Absolute: 0 10*3/uL (ref 0.0–0.7)
Eosinophils Relative: 0 % (ref 0–5)

## 2010-10-25 LAB — GLUCOSE, CAPILLARY: Glucose-Capillary: 121 mg/dL — ABNORMAL HIGH (ref 70–99)

## 2010-10-27 ENCOUNTER — Encounter: Payer: Self-pay | Admitting: Internal Medicine

## 2010-10-27 ENCOUNTER — Ambulatory Visit (INDEPENDENT_AMBULATORY_CARE_PROVIDER_SITE_OTHER): Payer: Medicare Other | Admitting: Internal Medicine

## 2010-10-27 VITALS — BP 139/83 | HR 73 | Temp 98.7°F | Ht 67.0 in | Wt 196.8 lb

## 2010-10-27 DIAGNOSIS — E039 Hypothyroidism, unspecified: Secondary | ICD-10-CM

## 2010-10-27 DIAGNOSIS — K219 Gastro-esophageal reflux disease without esophagitis: Secondary | ICD-10-CM

## 2010-10-27 DIAGNOSIS — E119 Type 2 diabetes mellitus without complications: Secondary | ICD-10-CM

## 2010-10-27 DIAGNOSIS — I10 Essential (primary) hypertension: Secondary | ICD-10-CM

## 2010-10-27 DIAGNOSIS — E7800 Pure hypercholesterolemia, unspecified: Secondary | ICD-10-CM

## 2010-10-27 DIAGNOSIS — Z0000 Encounter for general adult medical examination without abnormal findings: Secondary | ICD-10-CM

## 2010-10-27 MED ORDER — FLUTICASONE PROPIONATE 50 MCG/ACT NA SUSP: 2.0000 | Freq: Every day | NASAL | Status: DC

## 2010-10-27 MED ORDER — ZOSTER VACCINE LIVE 19400 UNT/0.65ML ~~LOC~~ SOLR: 0.6500 mL | Freq: Once | SUBCUTANEOUS | Status: AC

## 2010-10-27 MED ORDER — OMEPRAZOLE 20 MG PO CPDR: 40.0000 mg | DELAYED_RELEASE_CAPSULE | Freq: Every day | ORAL | Status: DC

## 2010-10-27 NOTE — Assessment & Plan Note (Signed)
Lab Results  Component Value Date   NA 139 05/19/2010   K 4.4 05/19/2010   CL 101 05/19/2010   CO2 27 05/19/2010   BUN 12 05/19/2010   CREATININE 0.73 05/19/2010    BP Readings from Last 3 Encounters:  10/27/10 139/83  10/21/10 149/79  10/01/10 122/80    Assessment: Hypertension control:  controlled  Progress toward goals:  at goal Barriers to meeting goals:  no barriers identified  Plan: Hypertension treatment:  continue current medications

## 2010-10-27 NOTE — Assessment & Plan Note (Signed)
Lab Results  Component Value Date   HGBA1C 5.7 10/27/2010   HGBA1C 5.5 07/28/2010   CREATININE 0.73 05/19/2010   MICROALBUR 0.75 05/19/2010   MICRALBCREAT 9.0 05/19/2010   CHOL 189 05/19/2010   HDL 72 05/19/2010   TRIG 100 05/19/2010     Assessment: Diabetes control: controlled Progress toward goals: at goal Barriers to meeting goals: no barriers identified  Plan: Diabetes treatment: continue current medications Refer to: none Instruction/counseling given: reminded to get eye exam and discussed foot care

## 2010-10-27 NOTE — Assessment & Plan Note (Signed)
Symptoms are well-controlled on current regimen 

## 2010-10-27 NOTE — Assessment & Plan Note (Signed)
Lab Results  Component Value Date   TSH 1.555 12/30/2009    Patient has no symptoms of thyroid dysfunction.  Will check a TSH level.

## 2010-10-27 NOTE — Assessment & Plan Note (Signed)
Lipids:    Component Value Date/Time   CHOL 189 05/19/2010 2006   TRIG 100 05/19/2010 2006   HDL 72 05/19/2010 2006   LDLCALC 97 05/19/2010 2006   VLDL 20 05/19/2010 2006   CHOLHDL 2.6 Ratio 05/19/2010 2006    It is not clear whether patient's joint pains were related to the pravastatin; the pains have resolved off of pravastatin.  I advised her to resume pravastatin at her previous dose, but to let me know immediately if she has any recurrence of joint pain or any myalgias or other new symptoms.

## 2010-10-27 NOTE — Progress Notes (Signed)
  Subjective:    Patient ID: [name] [name], female    DOB: 04/07/1946, 65 y.o.   MRN: [id number]  HPI Patient returns for follow up of her diabetes mellitus, hypothyroidism, hypercholesterolemia, hypertension, and other chronic medical problems.  Today she reports that she has been doing well, without acute complaints.  Her blood sugars have been well controlled.  She reports that she is compliant with her medications.  Patient stopped her pravastatin as instructed by Dr. Myrtis Ser because of some concern that joint pain might be provoked by the pravastatin; she reports that the joint pain resolved, but It is not clear by history whether the joint pain was a side effect.  She denies any myalgias or muscle cramping.   Review of Systems  Constitutional: Negative for fever and chills.  Respiratory: Negative for shortness of breath.   Cardiovascular: Negative for chest pain.  Gastrointestinal: Negative for nausea, vomiting, blood in stool and anal bleeding.  Musculoskeletal: Negative for myalgias.       Objective:   Physical Exam  Constitutional: No distress.  Cardiovascular: Normal rate, regular rhythm and normal heart sounds.  Exam reveals no gallop and no friction rub.   No murmur heard. Pulmonary/Chest: Effort normal and breath sounds normal. No respiratory distress. She has no wheezes. She has no rales.  Abdominal: Soft. Bowel sounds are normal. She exhibits no distension. There is no tenderness. There is no rebound and no guarding.  Musculoskeletal: She exhibits no edema.          Assessment & Plan:

## 2010-10-28 ENCOUNTER — Telehealth: Payer: Self-pay | Admitting: *Deleted

## 2010-10-28 NOTE — Telephone Encounter (Signed)
Pt called to inform Dr Meredith Pel what type of Vitamin D she is taking.  Viactiv calcium plus d 500 iu , 500 mg calcium and 40mg  vit K  Also wants you to know BP is going up it was 150/90 today. Pt # [phone]

## 2010-10-28 NOTE — Telephone Encounter (Signed)
I entered her med into the med list.  If her blood pressure is persistently elevated, she should let us know.

## 2010-10-28 NOTE — Telephone Encounter (Signed)
Return call to pt and she states she takes 2 Vitamin D tabs in AM and 2 in PM  Pt states last night she "Figures" her BP went high and caused her to have a headache. She did not check it. She feels better now.

## 2010-10-29 ENCOUNTER — Other Ambulatory Visit: Payer: Medicare Other

## 2010-10-29 NOTE — Telephone Encounter (Signed)
Pt informed

## 2010-11-02 ENCOUNTER — Encounter: Payer: Medicare Other | Attending: Neurosurgery | Admitting: Neurosurgery

## 2010-11-02 DIAGNOSIS — M549 Dorsalgia, unspecified: Secondary | ICD-10-CM

## 2010-11-02 DIAGNOSIS — M5430 Sciatica, unspecified side: Secondary | ICD-10-CM

## 2010-11-02 DIAGNOSIS — M25569 Pain in unspecified knee: Secondary | ICD-10-CM | POA: Insufficient documentation

## 2010-11-02 DIAGNOSIS — M79609 Pain in unspecified limb: Secondary | ICD-10-CM | POA: Insufficient documentation

## 2010-11-02 DIAGNOSIS — F411 Generalized anxiety disorder: Secondary | ICD-10-CM | POA: Insufficient documentation

## 2010-11-02 DIAGNOSIS — F329 Major depressive disorder, single episode, unspecified: Secondary | ICD-10-CM | POA: Insufficient documentation

## 2010-11-02 DIAGNOSIS — G894 Chronic pain syndrome: Secondary | ICD-10-CM | POA: Insufficient documentation

## 2010-11-02 DIAGNOSIS — IMO0001 Reserved for inherently not codable concepts without codable children: Secondary | ICD-10-CM | POA: Insufficient documentation

## 2010-11-02 DIAGNOSIS — M542 Cervicalgia: Secondary | ICD-10-CM

## 2010-11-02 DIAGNOSIS — F3289 Other specified depressive episodes: Secondary | ICD-10-CM | POA: Insufficient documentation

## 2010-11-02 DIAGNOSIS — M25559 Pain in unspecified hip: Secondary | ICD-10-CM | POA: Insufficient documentation

## 2010-11-03 ENCOUNTER — Other Ambulatory Visit: Payer: Medicare Other | Admitting: Internal Medicine

## 2010-11-03 DIAGNOSIS — E119 Type 2 diabetes mellitus without complications: Secondary | ICD-10-CM

## 2010-11-03 DIAGNOSIS — E039 Hypothyroidism, unspecified: Secondary | ICD-10-CM

## 2010-11-03 DIAGNOSIS — E7800 Pure hypercholesterolemia, unspecified: Secondary | ICD-10-CM

## 2010-11-03 LAB — LIPID PANEL
HDL: 67 mg/dL (ref >39)
LDL Cholesterol: 92 mg/dL (ref 0–99)
Triglycerides: 153 mg/dL — ABNORMAL HIGH (ref <150)
VLDL: 31 mg/dL (ref 0–40)

## 2010-11-03 LAB — CBC WITH DIFFERENTIAL/PLATELET
Lymphocytes Relative: 35 % (ref 12–46)
MCH: 29.5 pg (ref 26.0–34.0)
Monocytes Absolute: 0.4 10*3/uL (ref 0.1–1.0)
Monocytes Relative: 6 % (ref 3–12)
Neutro Abs: 3.4 10*3/uL (ref 1.7–7.7)
Neutrophils Relative %: 57 % (ref 43–77)
Platelets: 355 10*3/uL (ref 150–400)
RBC: 4.2 MIL/uL (ref 3.87–5.11)
RDW: 14 % (ref 11.5–15.5)

## 2010-11-03 NOTE — Assessment & Plan Note (Signed)
ACCOUNT:  [id number].  This is a patient of Dr. Riley Kill who has been seen here for sometime for chronic pain syndrome.  She was last seen in February.  She states her pain has not changed.  Really, she has some left upper extremity pain, some right forearm pain, right lower extremity pain, left hip pain, right knee pain, and some radicular symptoms.  She had an MRI in quite some time and she feels like her back and radicular symptoms have worsened.  She rates her pain at about 6 on average rate to the level about 9. Pain is fair.  Sleep is fair.  Worse at daytime with activity.  Rest, medications, and injections tend to help.  She can walk for about an hour.  She does use a cane on and off, walker on and off.  States right now, she is walking independently.  She climb steps.  She drive.  She takes her own ADLs.  She last worked in 2004.  She does have some numbness, tingling problems with dizziness, depression, anxiety, bladder control issues.  She is on disability.  REVIEW OF SYSTEMS:  Notable for fever, chills, weight gain, some rashes, high and low blood sugars, nausea, vomiting, constipation, abdominal pain from time-to-time, limb swelling, coughing, and shortness of breath.  PAST MEDICAL HISTORY:  Otherwise unchanged.  SOCIAL HISTORY:  Otherwise unchanged.  PHYSICAL EXAM:  Blood pressure is 147/79, pulse is 89, respirations 18, O2 sats 99 on room air.  Motor strength is 5/5 in the lower extremities including iliopsoas, quadriceps, dorsiflexors, EHL, and plantar flexors. She is point tender in her low back.  Her affect is bright and alert. She is oriented x3.  Constitutionally, she is within normal limits.  She has a normal gait.  Sensation appears to be intact throughout the upper and lower extremities.  ASSESSMENT:  Chronic pain syndrome with some radicular type pain and some shoulder impingement type pain.  She has generalized anxiety, depression, and fibromyalgia.  We  talked about an MRI of the low back.  She never had surgery.  We are going to order an MRI of the lumbar spine without gadolinium.  Have her come back to go over that with Dr. Riley Kill to see if there is anything operable and get her off some of the medicines that she is requesting be off of.  Questions were encouraged and answered.  She is in agreement with this.  She did not have her Vicodin bottle today.  We are going to go ahead and refill 5/500 one p.o. b.i.d.  I gave her #60 with no refill, and she is encouraged to remember to bring that bottle for now and there is no signs of aberrant behavior.     Aarish Rockers L. Blima Dessert    RLW/MedQ D:  11/02/2010 12:15:59  T:  11/03/2010 00:35:26  Job #:  [id number]

## 2010-11-04 LAB — COMPLETE METABOLIC PANEL WITH GFR
CO2: 27 mEq/L (ref 19–32)
Calcium: 9.3 mg/dL (ref 8.4–10.5)
Chloride: 100 mEq/L (ref 96–112)
Creat: 0.79 mg/dL (ref 0.40–1.20)
GFR, Est African American: >60 mL/min (ref >60)
GFR, Est Non African American: >60 mL/min (ref >60)
Glucose, Bld: 89 mg/dL (ref 70–99)
Sodium: 138 mEq/L (ref 135–145)
Total Bilirubin: 0.3 mg/dL (ref 0.3–1.2)
Total Protein: 7 g/dL (ref 6.0–8.3)

## 2010-11-07 ENCOUNTER — Other Ambulatory Visit: Payer: Self-pay | Admitting: Internal Medicine

## 2010-11-17 ENCOUNTER — Other Ambulatory Visit: Payer: Self-pay | Admitting: Physical Medicine & Rehabilitation

## 2010-11-17 DIAGNOSIS — M79606 Pain in leg, unspecified: Secondary | ICD-10-CM

## 2010-11-17 DIAGNOSIS — M5410 Radiculopathy, site unspecified: Secondary | ICD-10-CM

## 2010-11-18 ENCOUNTER — Other Ambulatory Visit: Payer: Self-pay | Admitting: Internal Medicine

## 2010-11-23 NOTE — Procedures (Signed)
[name] [name], [name] [name]                ACCOUNT NO.:  [id number]   MEDICAL RECORD NO.:  [id number]          PATIENT TYPE:  REC   LOCATION:  [facility]                          FACILITY:  [facility]   PHYSICIAN:  Erick Colace, M.D.DATE OF BIRTH:  27-Dec-1945   DATE OF PROCEDURE:  04/26/2007  DATE OF DISCHARGE:                               OPERATIVE REPORT   PROCEDURE PERFORMED:  Right S1 transforaminal epidural steroid injection  under fluoroscopic guidance   INDICATIONS:  Right S1 radiculopathy with good previous relief with  epidural steroid injections x2. Her pain interferes with her general  activity, meal prep, household duties, and shopping as well as with  dressing.  The pain is only partially responsive to medication  management.  Informed consent was obtained after describing risks and  benefits of the procedure to the patient.  These include bleeding,  bruising, infection, loss of bowel and bladder function, temporary or  permanent paralysis.  She elects to proceed and has given written  consent.   DESCRIPTION OF PROCEDURE:  The patient was placed prone on the  fluoroscopy table.  Betadine prep, sterile drape.  A 25gauge 1 1/2 inch  needle was used to anesthetize the skin and subcu tissue with 1%  lidocaine x2 mL.  Then, a 25 gauge 3.5 inch spinal needle was inserted  under fluoroscopic topic guidance targeting the right S1 foramen. AP and  lateral imaging utilized.  Omnipaque 180 x 1 mL demonstrated foraminal  spread and nerve root spread.  Then, a solution containing 1 mL of 40 mg  per mL Depo-Medrol and 2 mL of 1% MPF lidocaine were injected.  Pre  injection pain level pain 8/10, post injection 0/10.  We will see her  back in two months for reinjection.      Erick Colace, M.D.  Electronically Signed     AEK/MEDQ  D:  04/26/2007 17:41:44  T:  04/27/2007 17:17:22  Job:  [id number]

## 2010-11-23 NOTE — Procedures (Signed)
[name] [name], [name] [name]                ACCOUNT NO.:  [id number]   MEDICAL RECORD NO.:  [id number]          PATIENT TYPE:  REC   LOCATION:  [facility]                          FACILITY:  [facility]   PHYSICIAN:  Erick Colace, M.D.DATE OF BIRTH:  11-05-1945   DATE OF PROCEDURE:  01/08/2007  DATE OF DISCHARGE:                               OPERATIVE REPORT   PROCEDURE:  A S1 transforaminal epidural steroid injection under  fluoroscopic guidance.   INDICATION:  Right lower extremity radicular pain.  She had 1-2-week  relief with translaminar injection L5-S1 right paramedian.   Pain is only partially responsive to medication management.   Informed consent was obtained after describing risks and benefits to the  patient.  These include bleeding, bruising, infection, loss of bowel or  bladder function, temporary or permanent paralysis.  She elects to  proceed and has given written consent.  The patient placed prone on  fluoroscopy table.  Betadine prep, sterile drape.  A 25 gauge inch and a  half needle was used to anesthetize skin and subcu tissue, 1% lidocaine  x3 mL.  Then a 22 gauge 3-1/2 inch spinal needle was inserted under  fluoroscopic guidance into the right S1 foramen.   AP, lateral and oblique images utilized to enter needle into the right  S1 foramen.  Omnipaque 180 x1 mL demonstrated good epidural spread,  followed by injection of 1 mL of 40 mg/mL of Depo-Medrol and 2 mL of 1%  lidocaine MPF.  The patient tolerated the procedure well.  Pre and post  injection vitals stable.  Husband to drive her home.  Post injection  instructions.  She will see me back in another four weeks or so for  repeat injection and then Dr. Riley Kill will start prescribing some  physical therapy treatments including core strengthening.      Erick Colace, M.D.  Electronically Signed     AEK/MEDQ  D:  01/08/2007 11:13:26  T:  01/08/2007 13:42:04  Job:  [id number]

## 2010-11-23 NOTE — Procedures (Signed)
[name], [name]                ACCOUNT NO.:  [id number]   MEDICAL RECORD NO.:  [id number]          PATIENT TYPE:  REC   LOCATION:  [facility]                          FACILITY:  [facility]   PHYSICIAN:  Erick Colace, M.D.DATE OF BIRTH:  03/07/1946   DATE OF PROCEDURE:  12/11/2006  DATE OF DISCHARGE:                               OPERATIVE REPORT   This is a right paramedian L5-S1 translaminar epidural steroid injection  under fluoroscopic guidance.   INDICATIONS:  Lumbar pain with right lower extremity greater than left  lower extremity pain, down to the foot.  Pain is only partially  responsive to medication management.   Informed consent was obtained after describing risks and benefits of the  procedure to the patient, these including bleeding, bruising, infection,  loss of bowel and bladder function, temporary or permanent paralysis.   She elects to proceed and has given written consent.  The patient was  placed prone on fluoroscopy table.  Betadine prep, sterile drape.  A 25-  gauge 1.5-inch needle was used to anesthetize the skin and subcutaneous  tissue with 1% lidocaine x2 mL.  Then, an 18 gauge Hustead needle was  inserted under fluoroscopic guidance, right paramedian approach.  AP and  lateral imaging utilized, loss-of-resistance technique, positive loss.  Then, then Omnipaque 180 x1 mL demonstrated good epidural spread  followed by a solution of 2 mL of 40 mg per mL Depo-Medrol 2 mL of 1%  MPF lidocaine and 1 mL of 0.9% preservative-free normal saline.  The  patient tolerated the procedure well.  Pre/post injection vitals stable.  Post injection instructions given.  Pre-injection pain level 7/10.  Post-  injection pain level 0/10.  The patient ambulated to checkout without  difficulty and with decreased pain.      Erick Colace, M.D.  Electronically Signed     AEK/MEDQ  D:  12/11/2006 16:10:96  T:  12/11/2006 04:54:09  Job:  [id number]

## 2010-11-23 NOTE — Assessment & Plan Note (Signed)
The patient is back regarding chronic pain.  She is complaining of some  right knee buckling and left knee pain.  She notes the right knee  buckles forward and backward.  She has had some periodic swelling.  Her  low back is bothering her a bit more.  She is feeling better with  injections but says that they are wearing off.  She does some walking,  usually only 3-5 times a week schedule.  She walks about 15 minutes at a  time.  She does some light stretching but really that is the extent of  her exercising.  She remains on Lyrica, Effexor, Rozerem, Vicodin, and  has come off Aleve.  Otherwise symptoms are stable.  The patient rates  her pain as 7/10.  She describes it as dull, constant, and sometimes  aching.  Sleep is fair.   REVIEW OF SYSTEMS:  Notable for the above as well as some numbness and  tingling in the leg and fingers.  She has intermittent coughing.  She  has some skin rash at times as well as fever due to infection.  Otherwise pertinent positives listed above.   SOCIAL HISTORY:  Without change.  She is with her husband today.   PHYSICAL EXAMINATION:  VITAL SIGNS:  Blood pressure 144/87, pulse 77,  respiratory rate 18, and she is saturating 97% on room air.  GENERAL:  The patient is pleasant, alert and oriented x3.  Her weight is  down, but still is overweight.  She walks with no obvious difficulty.  She had no instability of either knee.  There is some mild peripatellar  swelling.  There is some subcutaneous fat globules behind the knee.  No  meniscal signs were appreciated today.  She was weak in her quadriceps  at 4/5.  Hamstrings were 4+ to 5 out of 5.  Low back was generally  stable.  Some pain with flexion and extension.  Cognitively she was  appropriate with alertness and orientation today.  Cranial nerve  examination was intact.  HEART:  Regular.  CHEST:  Clear.  ABDOMEN:  Soft and nontender.   ASSESSMENT:  1. Lumbar spondylosis with degenerative disk disease  with radiculitis      at L5-S1.  2. History of increased liver function tests and elevated ammonia      level which has been borderline.  3. Bilateral rotator cuff syndrome.  4. Gait disorder.  5. Bilateral knee pain and buckling likely due to quadriceps weakness.  6. Fibromyalgia syndrome.  7. Anxiety disorder.   PLAN:  Continue to exercise and appropriate diet.  Recommending  quadriceps strengthening exercises including leg extension, stationary  bike, and pool walking.  She also could work on steps and walking up  inclines as well to help strengthen these areas.  She may do well with a  neoprene knee sleeve as well.  Refill Lyrica, Effexor, and Rozerem  today.  The patient may use Aleve p.r.n. for arthritic symptoms.  Vicodin p.r.n. for breakthrough severe pain.      Ranelle Oyster, M.D.  Electronically Signed     ZTS/MedQ  D:  08/01/2007 10:20:00  T:  08/01/2007 11:06:11  Job #:  [id number]

## 2010-11-23 NOTE — Assessment & Plan Note (Signed)
[name] [name] is back regarding her chronic pain complaints. She had her right  paramedian translaminar injection at L5, S1 on June 2, by Dr. Wynn Banker  and she had nice results. Her pain right now is a 7 out of 10. She is  going back for another injection in a couple of weeks. I had sent her  for liver function tests after report of increased LFTs and her liver  function tests were marginally abnormal with the ALT at 36. Her alkaline  phosphatase was 140. Her ammonia level was 38. She states that her mood  has been a bit better. She went down on her Lyrica due to some problems  with muscle twitching. She had a fall a few weeks ago that lead to right  foot swelling.   REVIEW OF SYSTEMS:  Patient reports bladder and bowel control problems,  numbness, tremor, trouble walking, occasional dizziness, depression,  confusion, anxiety, limb swelling, and coughing. Full review is in the  written health and history section. Other pertinent positives are above.   SOCIAL HISTORY:  Patient is married and husband is again with her here  today.   PHYSICAL EXAMINATION:  Blood pressure 140/74, pulse is 92, respiratory  rate 16, sating 95% on room air. Patient is generally pleasant. Alert  and oriented x3. Her affect seemed much improved today. She was very  alert and had good insight and awareness. Strength is generally stable  at 4 to 4 + out of 5 through out. She seemed to have less inconsistency  today. She has positive rotator cuff maneuvers.  HEART: Regular rate.  CHEST: Clear.  ABDOMEN: Soft, nontender.  She continues to have right leg symptoms consistent with radiculitis.   IMPRESSION:  1. Lumbar spondylosis and degenerative disc disease with radiculitis      at L5, S1. Patient has responded favorably to her first injection.  2. Increased LFTs and an elevated ammonia level (borderline).  3. Bilateral rotator cuff syndrome.  4. Gait disorder.  5. Memory deficits consistent with fibromyalgia fog.  6. Anxiety disorder.   PLAN:  1. We will send the patient back for second injection with Dr.      Wynn Banker at the L5, S1 level.  2. After she has her injection series, Dr. Wynn Banker would like to      begin physical therapy to work on low back and core muscle      stretching and strengthening so that she may developed a home      exercise program to preserve her back.  3. We will recheck an ammonia level in about a months time. I think      her Seroquel may be partially causing this elevation. We will  stop      this over the next few days and begin a trial of Rozerem 8 mg at      bedtime.  4. Continue Effexor current dosing of 300 mg daily, as well as Lyrica      150 mg b.i.d.  5. Encouraged safe ambulation and gait.  6. Discuss rotator cuff issues at our next visit together.  7. I will see her back after she completes injections with Dr.      Wynn Banker.     Ranelle Oyster, M.D.  Electronically Signed    ZTS/MedQ  D:  12/29/2006 10:55:35  T:  12/29/2006 13:16:20  Job #:  [id number]

## 2010-11-23 NOTE — Assessment & Plan Note (Signed)
[name] [name] is back regarding her multiple pain issues.  She has had good  results with the S1 transforaminal and paramedian injections by Dr.  Wynn Banker.  She is set up for the third injection next week.  We  reviewed her MRI of her right shoulder, which revealed tendinopathy in  the supraspinatus as well as arthritis in the shoulder.  The pain is a 7  out of 10 on an average.  She has pain sometimes in the hands at night  with some cramping and burning pain, which she has talked about in the  past.  She remains on Effexor, Lyrica, and Rozerem with fair results.  The patient does not have much of an exercise or a maintenance program  at this point.  She can only walk about 10 minutes at a time.  She uses  a cane for support.   REVIEW OF SYSTEMS:  Notable for numbness, tremors, anxiety, occasional  depression, skin rash, night sweats, limb swelling, and coughing.  Other  pertinent positives are listed above.   SOCIAL HISTORY:  The patient is married and her husband is supportive.   PHYSICAL EXAMINATION:  VITAL SIGNS:  Blood pressure is 145/81, pulse is  91, respiratory rate is 18, saturation is 97% on room air.  GENERAL:  The patient is pleasant, alert and oriented x3.  PAIN AND REHAB EVALUATION:  She walks with an antalgic gait somewhat  favoring the right leg.  Coordination is fair with reflexes 1+.  Sensation is generally intact.  Motor function is inconsistent, but 4 to  5 out of 5 with some pain inhibition noted.  The right shoulder is  notable for rotator cuff signs today.  HEART:  Rate was regular.  CHEST:  Clear.  ABDOMEN:  Soft and nontender  RIGHT LEG:  Notable for positive straight raising test.  BACK:  Somewhat stable in the lower lumbar spine on the right.   ASSESSMENT:  1. Lumbar spondylosis and degenerative disk disease with radiculitis      at L5-S1.  The patient has had good response with injections.  2. History of increased liver function tests and elevated ammonia    level, which are borderline.  3. Bilateral rotator cuff syndrome, right greater than left.  4. Gait disorder.  5. Memory deficits consistent with fibro-fog and possibly related to      Lyrica.  6. Anxiety disorder.   PLAN:  1. Complete series of injections by Dr. Wynn Banker.  2. Will send the patient to therapy for cord muscle strengthening and      stretching.  She also needs to work on posture.  Will have Therapy      work on right shoulder stabilization and range of motion.  She      needs to develop a good home exercise program.  3. Follow up ammonia and liver function tests in the Fall.  4. Continue Effexor and Lyrica at current dosing.  5. I injected the right rotator cuff after informed consent using 40      mg Kenalog and 3 ml of 1% Lidocaine.     [name] [name], M.D.  Electronically Signed    ZTS/MedQ  D:  02/07/2007 10:34:48  T:  02/07/2007 23:32:09  Job #:  [id number]

## 2010-11-23 NOTE — Assessment & Plan Note (Signed)
[name] [name] is back regarding her fibromyalgia and diffuse pain.  She has  seen multiple specialists over the last year.  I have only seen her  really 2 or 3 times over the last 12 months.  She has seen Dr. Nickola Major at [facility] who performed injections  apparently in the fall L4-L5, L5-S1.  I received a note from her  regarding her current plan of care and recent MRI from April.  She  continues to have a right-sided disk/foraminal protrusion in L5-S1 with  spurring.  There is evidence that this has progressed since her last  film.  She also had a shallow protrusion L4-L5 extending to the left  essentially compressing the L4 nerve root.  Note multiple lumbar facet  disease is also seen with advanced disk space narrowing at L5-S1 which  had not changed specifically since last examination.  The patient did  feel some relief with the injections from Dr. Nickola Major, is still  having some right-legged symptoms.  She also reports high ammonia levels  per her Neurologist.  I have no record of this.  She has been seen by  Dr. Delbert Harness as well as Dr. Hilda Lias regarding arthritis and  degenerative changes in her shoulder.  I believe she has seen Dr. Delbert Harness most recently for the right shoulder pain with no further  followup recommended at this point in time.  There is note in Dr. Freida Busman-  Bethea's note regarding marked tendinopathy at the supraspinatus and the  infraspinatus, as well as subscapularis muscles without tear.  Biceps  tendons were normal.  Some osteoarthritis was seen as well as  subacromial and subdeltoid bursitis.  The patient continues to struggle  with falling.  She has been diagnosed with peripheral neuropathy by  nerve conduction testing.  She states that they only examined her legs.  She continues on her Lyrica, but only at 150 twice a day.  She is on the  Effexor 225 mg in the morning.  She has also been seen for myoclonic  jerks.   On patient's pain inventory today she rates her pain 8 out 10; describes  as aching, sharp and sometimes dull.  She states this is in the neck,  right shoulder, and right leg.  Sleep is poor.   She also uses oxycodone for breakthrough pain currently.  We reviewed  her medications today.  She is using her Lidoderm patches p.r.n. and  occasionally some Aleve.   REVIEW OF SYSTEMS:  Notable for multiple items including numbness,  tremor, tingling, spasms, dizziness, confusion, limb swelling, coughing,  weight gain, night sweats, fever, chills, bowel and bladder control  problems, intermittent diarrhea, constipation, shortness of breath.  Other issues are mentioned in the written review of systems.   SOCIAL HISTORY:  Is without change.   PHYSICAL EXAMINATION:  Blood pressure 156/85, pulse is 94, respiratory  rate 17, she is sating 99% on room air.  The patient is generally  pleasant, in no acute distress.  She is alert and oriented x3.  She  walks with a cane and limps, favoring the right leg.  She does have some  signs and symptoms in the right leg of pain with straight leg raising.  No focal weakness was appreciated right over left, although she is  generally weak at 4-4+ out of 5.  Reflexes are brisk at 2+.  Right upper  extremity is equal to left upper extremity for distal strength.  She had  some weakness in the rotator cuff and proximal musculature at 4- out of  5.  Rotator cuff maneuvers were positive still.  She was limited in  internal rotation and external rotation, as well as flexion.  Subdeltoid  and subacromial areas were consistent with bursitis as well today.  Spurling's test was negative to equivocal.  The patient used to have  some anxiety in general on her appearance.  She has some difficulty with  her focus and recall in general.  Sensory exam was diminished in the  distal lower extremities to pin prick and light touch.  HEART:  Regular.  CHEST:  Clear.  ABDOMEN:   Soft, nontender.  She remains overweight.   ASSESSMENT:  1. This remains a very complicated scenario.  Her fibromyalgia      generally seems to overshadow most issues, although she struggles      with other multiple pain complaints that are really not new.  She      has had varying effects with treatments in the past.  2. Regarding her lumbar spondylosis and degenerative disk disease.  We      will set her up for an injection with Dr. Wynn Banker for a right      paracentral L5-S1 translaminar injection as appointment is      available.  3. We will check ammonia levels as well as liver function tests today.      This is the first I heard that these were abnormal.  I doubt highly      that they are related to her narcotic use.  She is really not using      much in the way of narcotics at this time.  4. Obtain shoulder films to discern plan for rotator cuff syndrome.  I      would favor aggressive therapy rather than further injections as      she has had multiple injections over the past year or two.  5. Gait disorder.  6. Memory deficits, possibly related to fibromyalgia fog.  7. Anxiety disorder.  Increase Effexor to 300 mg daily.  8. Fibromyalgia pain, will increase Lyrica back to 150 mg t.i.d.  9. For breakthrough pain we will continue with oxycodone for now as      previously written.  10.I will see the patient back in about one month's time to continue      putting the pieces together.  She will see Dr. Wynn Banker for an      injection as an appointment is available.      Ranelle Oyster, M.D.  Electronically Signed     ZTS/MedQ  D:  11/20/2006 13:54:04  T:  11/20/2006 15:22:31  Job #:  [id number]   cc:   Herminio Heads, MD  Fax: [phone]

## 2010-11-23 NOTE — Procedures (Signed)
NAMEREANNE, NELLUMS                ACCOUNT NO.:  [id number]   MEDICAL RECORD NO.:  [id number]          PATIENT TYPE:  REC   LOCATION:  [facility]                         FACILITY:  [facility]   PHYSICIAN:  Erick Colace, M.D.DATE OF BIRTH:  1945-10-20   DATE OF PROCEDURE:  DATE OF DISCHARGE:                               OPERATIVE REPORT   Ms. Tsan is here for repeat S1 transforaminal and epidural steroid  injection under fluoroscopic guidance.  The last was performed on January 08, 2007.  She had good relief for about 6 weeks with this injection.  Her pain has gotten back to a 4-5/10 level and increases with activity.   INTERVAL MEDICAL HISTORY:  Otherwise negative.  Negative for blood  thinner, anti-inflammatory, or antibiotic usage in the past 5 days.  She  has a driver today.   PROCEDURE:  Right S1 transforaminal epidural steroid injection under  fluoroscopic guidance.   Informed consent was obtained after describing the risks and benefits of  the procedure to the patient.  These include bleeding, bruising,  infection, loss of bowel or bladder function, temporary or permanent  paralysis.  She elects to proceed and has given written consent.   The patient was placed prone on the fluoroscopy table.  Betadine prep  and sterile drape.  25-gauge 1.5-inch needle was used to anesthetize the  skin and subcutaneous tissue.  1% lidocaine x2 mL.  A 22-gauge 3.5-inch  spinal needle was inserted under fluoroscopic guidance into the right S1  foramen.  AP and lateral imaging utilized.  Omnipaque 180 x1 mL  demonstrated good foraminal spread and nerve root spread.  A solution  containing 1 mL of 40 mg per mL Depo-Medrol and 2 mL of 1% lidocaine  were injected.  Preinjection pain level 4-5/10, postinjection 1/10.   We will see her back in 2 months for repeat injection.      Erick Colace, M.D.  Electronically Signed     AEK/MEDQ  D:  02/26/2007 17:10:40  T:  02/27/2007 10:43:36   Job:  [id number]   cc:   Ranelle Oyster, M.D.  Fax: [phone]

## 2010-11-23 NOTE — Procedures (Signed)
[name] [name], [name] [name]                ACCOUNT NO.:  [id number]   MEDICAL RECORD NO.:  [id number]          PATIENT TYPE:  REC   LOCATION:  [facility]                          FACILITY:  [facility]   PHYSICIAN:  Erick Colace, M.D.DATE OF BIRTH:  1946-06-05   DATE OF PROCEDURE:  01/08/2007  DATE OF DISCHARGE:                               OPERATIVE REPORT   PROCEDURE:  Right S1 transforaminal epidural steroid injection under  fluoroscopic guidance.   INDICATIONS:  Right lower extremity radicular pain.  She had one week  relief with L5-S1 translaminar injection.   Informed consent was obtained after describing risks and benefits of the  procedure to the patient.  These include bleeding, bruising, infection,  loss of bowel or bladder function, temporary or permanent paralysis.  She elects to proceed and has given written consent.  The patient placed  prone on fluoroscopy table.  Betadine prep, sterile drape.  A 25 gauge  inch and a half needle was used to anesthetize the skin and subcu  tissue, 1% lidocaine x2 mL.  Initially attempted to access L5-S1  intervertebral foramen, however, osteophyte overgrowth prevented this  and then S1 was performed.  AP, lateral and oblique imaging utilized.  Omnipaque 180 x0.5 mL demonstrated good epidural spread, no  intravascular uptake.  Then a solution containing 1 mL of 40 mg/mL of  Depo-Medrol and 2 mL of 1% MPF lidocaine injected.  The patient  tolerated procedure well.  Pre and Post injection vitals stable.  Post  injection instructions given.  Pre-injection pain level 7/10, post  injection 0/10.  See her in three weeks for possible reinjection.      Erick Colace, M.D.  Electronically Signed     AEK/MEDQ  D:  01/08/2007 09:50:40  T:  01/08/2007 11:09:06  Job:  [id number]

## 2010-11-23 NOTE — Assessment & Plan Note (Signed)
[facility]                            CARDIOLOGY OFFICE NOTE   NAME:Lukasik, OLIVER NEUWIRTH                       MRN:          [id number]  DATE:03/19/2007                            DOB:          06-02-1946    Ms. Arata is seen for evaluation of chest pain.  I have seen her in the  past. She was seen last in the office April 10, 2006.  The patient has  intermittently had chest pain over the years.  In 2004, she underwent  cardiac catheterization, and the coronaries were normal.  I then saw her  in August 2007.  At that time, we decided to proceed with an adenosine  Myoview scan.  This study was done, and it revealed no marked  abnormality.  There was no scar or ischemia.  The ejection fraction was  68%.   The patient has now had several months of discomfort in her anterior  upper right chest.  This is random.  It does not come on with exercise.  It may come on slightly when bending over.  It is not worsened with  respiration.  It only lasts a few seconds.  There is no nausea or  vomiting or diaphoresis.  There is no radiation.  She is here for  further evaluation.   PAST MEDICAL HISTORY:   ALLERGIES:  PENICILLIN, CODEINE, and SOME ANTI-INFLAMMATORY MEDICATIONS.   MEDICATIONS:  1. Lidoderm patch p.r.n.  2. Enalapril 10 mg.  3. Effexor XR 300 mg in the morning.  4. Tiazac 240.  5. Pain medicines.  6. Clonazepam.  7. Prevacid 30 b.i.d.  8. Multivitamins.  9. Aspirin 81.  10.Lyrica 150 three times a day  11.Stool softener.  12.Albuterol.  13.Zyrtec.  14.Levothroid 112 mcg.  15.Fiber.  16.Saline nasal spray.  17.Rosario for sleep.   OTHER MEDICAL PROBLEMS:  See the list below.   REVIEW OF SYSTEMS:  The patient has some shortness of breath.  She also  has some numbness and tingling due to neuropathy and from her carpal  tunnel syndrome.  Otherwise, Review of Systems is negative.   PHYSICAL EXAMINATION:  VITAL SIGNS:  Blood pressure 145/70  with pulse of  72.  GENERAL:  The patient is oriented to person, time, and place.  Her  affect is normal.  HEENT:  Reveals no xanthelasma.  She has normal extraocular motion.  NECK:  There are no carotid bruits.  There is no jugular venous  distention.  LUNGS:  Clear.  Respiratory effort is not labored.  CARDIAC:  Exam reveals and S1 with an S2. There are no clicks or  significant murmurs.  ABDOMEN:  Soft.  There are no masses or bruits.  EXTREMITIES:  She has no peripheral edema.  She has 2+ distal pulses in  her right foot.  Her left foot is in a type of walking cast.   PROBLEM LIST:  1. Hypertension, borderline treated.  2. Asthma, treated.  3. Hypothyroidism, treated.  4. Arthritis, treated.  5. Anxiety, depression, and panic disorder on medications.  6. Chronic headaches.  7. Questionable sleep apnea.  8. Status post cholecystectomy, hysterectomy, and breast biopsy.  9. History of according to patient of anal fissures in the past.  10.* Chest discomfort.   As noted in the HPI, she has been catheterized with normal coronaries in  2004 and a Myoview normal in 2007.  I believe that her current chest  pain is not cardiac in origin.  I have recommended no other cardiac  evaluation.  Reassurance is important for her, and I have tried to  reassure her that I believe her cardiac status is stable.  No further  workup now.  A copy of this note will go to Dr. Meredith Pel, and she will see  Dr. Meredith Pel in followup.     Luis Abed, MD, [facility]  Electronically Signed    JDK/MedQ  DD: 03/19/2007  DT: 03/19/2007  Job #: [id number]   cc:   Ileana Roup, M.D.

## 2010-11-23 NOTE — Assessment & Plan Note (Signed)
[name] [name] is back regarding her fibromyalgia, multiple pain complaints.  She complains of back and neck pain once again.  She had good results  with the trigger point injections back in February, although since then  the results have worn off.  She notes pain with activities such as using  her sewing machine.  He also has some pain in her hand with resistance  present.  She rates her pain 8/10.  She uses Percocet for breakthrough  symptoms.  Rozerem has helped her sleep.  Pain interferes with general  activity, relations with others, enjoyment of life on a moderate to  severe level.   REVIEW OF SYSTEMS:  Notable for multiple items as detailed in the health  and history section of the chart.  Other pertinent positives are above.   SOCIAL HISTORY:  The patient is married.  Husband is again present with  her today.   PHYSICAL EXAMINATION:  Blood pressure is 137/78, pulse 86, respiratory  rate 18.  She is sating 98% on room air.  The patient is pleasant, alert  and oriented x3.  She lost weight and overall moving fairly well.  She  had pain over the right palm and cysts over the fourth and second flexor  tendon sheaths  These areas were tender.  She had no frank contracture.  Median and ulnar nerve Tinel signs were negative.  She had good strength  and sensation as a whole.  She had significant tightness once again and  trigger points along the T4-T5 paraspinal regions/rhomboid area.  She  also had pain and tightness over the right sternocleidomastoid muscle.  Strength overall generally 5/5 with normal sensory function and good  cognition.  Cranial nerves were normal.  Heart was regular.  Chest was  clear.  Abdomen was soft, nontender.   ASSESSMENT:  1. Lumbar spondylosis with degenerative disk disease and radiculitis      L5-S1.  2. Osteoarthritis of the knees.  3. Bilateral rotator cuff syndrome.  4. Gait disorder.  5. Fibromyalgia.  6. Myofascial pain.  7. Ganglion cyst right hand  along the flexor tendons.   PLAN:  1. Recommended Voltaren gel over the tender cysts for now.  I would      expect these to improve over the next few weeks with appropriate      treatment.  I could consider steroid injections if persistent.  2. After informed consent, we injected the right rhomboid/longissimus      area at T4-T5 with 2 separate trigger point injections consisting      of 2 mL of 1% lidocaine.  Also injected the mid sternocleidomastoid      muscle with 2 mL of 1% lidocaine as well.  Encourage good posture,      stretching, and rest breaks with her exercise and work.  We will      send her to [facility] for myofascial physical therapy.  3. The patient has Percocet for breakthrough pain still.  Continue      Lyrica and Effexor as well.  Also on Rozerem.  4. I will see her back in 3 months with followup in the interim if      needed.      Ranelle Oyster, M.D.  Electronically Signed     ZTS/MedQ  D:  11/05/2008 09:50:41  T:  11/05/2008 22:17:46  Job #:  [id number]

## 2010-11-23 NOTE — Assessment & Plan Note (Signed)
HISTORY OF PRESENT ILLNESS:  [name] [name] is back regarding her multiple pain  complaints.  I sent her to outpatient therapy for her myofascial pain  and that has improved.  She returned to the note from her therapist with  concerns over balance.  Yukie has concerns as well.  She complains with  some left thigh pain as well as some dots on her hands which we  discussed in the past.  The patient rates her pain overall 6/10.  She  has lot of her pain at joints in the shoulders, elbows, hips, knees, and  ankles today.  Pain is aching and constant in nature.  Pain interferes  with general activity, relations with others, enjoyment of life on a  moderate to severe level.  Sleep is fair.  Does note that she feels a  bit sleepy and groggy during the day and family and friends have noted  that as well.   REVIEW OF SYSTEMS:  Notable for the above as well as bowel and bladder  control problems, occasional limb swelling, abdominal repair,  constipation.  Other pertinent positives are above and full review is in  the written health and history section of the chart.   SOCIAL HISTORY:  The patient is married with her husband, who is with  her again today.   PHYSICAL EXAMINATION:  VITAL SIGNS:  Blood pressure is 139/84, pulse is  79, respiratory rate 18.  She is sating 98% on room air.  GENERAL:  The patient is generally pleasant, alert, and oriented x3.  EXTREMITIES:  He continues to have some pain over the hands and some  tenderness cysts over the flexor tendon sheaths.  Tinel's sign negative.  Strength is generally 5/5 in the upper extremities.  She is perhaps 4/5  in the lower extremities in a variable fashion.  Left knee was slightly  tender with active and passive range of motion.  She had no tenderness  with palpation along the femur and quadriceps muscles.  Balance was  really as baseline.  She is slightly wide-based and was not using her  cane today in fact.  NEUROLOGIC:  Cranial nerve exam was  normal.  HEART:  Regular.  CHEST:  Clear.  ABDOMEN:  Soft, nontender.   ASSESSMENT:  1. Lumbar spondylosis with degenerative disk disease and radiculitis      L5-S1.  2. Osteoarthritis of the knees.  3. Bilateral rotator cuff syndrome.  4. Myofascial pain.  5. Gait disorder with questionable vestibular component.  6. Fibromyalgia.  7. Ganglion cyst along the flexor tendons of the hands.   PLAN:  1. The patient was given a Medrol Dosepak to help with some of her      joint pain.  Continue Voltaren gel for tender cysts regions.  2. I really do not have anything new to offer her for her balance      other than a retrial of vestibular/balance program at [facility].  3. Percocet for breakthrough pain.  4. The patient may want to consider decreasing Lyrica and this could      be affecting her balance as well.  5. Effexor and Rocephin remain on board.  6. I will see her back in 3 months' time.      Ranelle Oyster, M.D.  Electronically Signed     ZTS/MedQ  D:  01/28/2009 10:08:15  T:  01/29/2009 00:15:45  Job #:  [id number]

## 2010-11-23 NOTE — Assessment & Plan Note (Signed)
[name] [name] is back regarding her chronic pain complaints. She has had good  results with the transforaminal epidural injections by Dr. Jess Barters.  She is still having some back pain, but her leg pain is much reduced.  She has been in therapy and working on back, range of motion, and  balance. She is walking without her cane and has lost 20 pounds. She is  moving much better. She still has multiple somatic complaints including  some pain in the knees that is intermittent and sharp, that happens when  she is standing or sitting. She has an occasional nagging neck pain,  some tingling in her feet, tremors in her hands at night, etcetera. She  rates her pain as a 4 to 5 out of 10. She describes it as constant and  aching. The pain interferes with her general activity, relationships  with others, and enjoyment of life on a moderate level.   REVIEW OF SYSTEMS:  On review of systems, the patient reports the above  as well as some depression and anxiety, though that is much improved.  She has occasional shortness of breath, nausea, and vomiting. Full  review of systems is in the written health and history section of the  chart.   MEDICATIONS:  1. Lyrica 150 mg t.i.d.  2. Effexor 150 mg 2 tablets q.a.m.  3. Rozerem 8 mg nightly.  4. Vicodin p.r.n. for breakthrough symptoms.  5. Aleve 2 b.i.d.   Other medications per her family physician.   PHYSICAL EXAMINATION:  VITAL SIGNS:  Blood pressure 161/85, pulse 88,  respiratory rate 18, she is saturating 97% on room air.  GENERAL:  The patient is pleasant, alert, and oriented x3. Affect is  bright and appropriate. She definitely has lost weight. Her balance is  much improved. She walks with no insignificant antalgia. Good  coordination. She is able to walk heel to toe without difficulty. She is  wearing shoes with poor insole support. No arch support is noted either.  She has some crepitus in both knees. She has some pain at the insertion  of the  patellar tendon on the tibia. Some crepitus and pain in the  posterior knees noted with extension and flexion. Mild peripatellar  swelling is noted as well. She had good strength at 5/5 on all four  limbs. She may have some decreased sensory function in the feet. No  tremors are seen. Cognitively, she is appropriate and very alert, and is  asking appropriate questions. Cranial nerve exam is intact.  HEART:  Regular.  CHEST:  Clear.  ABDOMEN:  Soft and nontender.   ASSESSMENT:  1. Lumbar spondylosis and degenerative disc disease with radiculitis      at L5 and S1 having excellent results with injections.  2. History of increased LFTs and elevated ammonia level (borderline).  3. Bilateral rotator cuff syndrome.  4. Gait disorder.  5. Fibromyalgia syndrome.  6. Anxiety disorder.   PLAN:  1. The patient continues to have multiple somatic complaints and      fixations. The goal would be to move her towards a more generalized      health focus, specifically on proper diet, exercise, and health. I      hate to keep treating each somatic complaint as we essentially are      chasing our tail.  2. The patient needs to follow up with her primary care physician      about her ammonia level and LFTs which may have been drawn  recently.  3. Maintain Effexor, Lyrica, and medications as written.  4. I discussed strengthening for the knee to help with support. I do      not want to inject these today. Recommended Glucosamine and more      appropriate shoe wear.  5. I will see her back in about three months' time. In general, I am      very pleased with how she looks. This perhaps is the best I have      seen her since she has been coming to me.      Ranelle Oyster, M.D.  Electronically Signed     ZTS/MedQ  D:  05/04/2007 10:27:54  T:  05/05/2007 01:57:17  Job #:  [id number]

## 2010-11-23 NOTE — Assessment & Plan Note (Signed)
[name] is back regarding her fibromyalgia and multiple pain  complaints.  She had a month to 6 weeks of results of the last trigger-  point injections we performed in thoracic spine.  She complains that  pain has come back since then with spasm and often it radiates from the  right to left.  She describes the pain is constant.  She still has some  left knee pain as well.  She likes Lyrica for fibromyalgia symptoms.  Rozerem seems to help her sleep.  She rates her pain as 6/10 today.  Her  Oswestry score is 44% today.  Sleep is fair.  She can walk about 15  minutes, but had to stop.  Pain is worsen with activities such as  household chores, prolonged sitting, or standing.  She often feels  better when she sits back against chair to rest her head.   REVIEW OF SYSTEMS:  Notable for multiple items including numbness,  tingling, still at times, but she complains of some pain in the right  elbow along the lateral epicondyle.  She complains of intermittent  dizziness, fever, weight gain, skin rash, constipation, abdominal pain,  occasional nausea, swelling, etc.  Full review is in the written health  and history section of the chart.   SOCIAL HISTORY:  Unchanged.  Husband is with her today.   PHYSICAL EXAMINATION:  VITAL SIGNS:  Blood pressure is 138/70, pulse is  86, and respiratory rate 18.  She is sating 98% on room air.  GENERAL:  The patient is pleasant, alert, and oriented x3.  Weight is  stable.  She had good balance today.  She does give favor to the right  leg still a bit with gait.  She has pain over the rhomboids and  longissimus muscles at the T4-T5 level.  Notable trigger points were  seen there with reproduction and radiation of pain across the back.  She  had some tenderness along the right lateral epicondyle.  Negative Tinel  signs today at the elbows and wrists.  SKIN:  Intact.  HEART:  Regular.  CHEST:  Clear.  ABDOMEN:  Soft and nontender.  EXTREMITIES:  Cognitively,  she is intact.  Reflexes are 2+.  Sensation  is normal.  Pulse is 2+.   ASSESSMENT:  1. Lumbar spondylosis with degenerative disk disease and radiculitis      L5-S1.  2. Osteoarthritis of the knees.  3. Bilateral rotator cuff syndrome.  4. Gait disorder.  5. Fibromyalgia.  6. Myofascial thoracic pain on the right.  7. Elbow pain/sensitivity.   PLAN:  1. I feel that elbow symptoms are likely due to tender points from her      fibromyalgia.  I see no signs of nerve involvement.  2. After informed consent, we injected the right rhomboid/longissimus      area at T4-T5, using 2 mL of 1% lidocaine in two separate      injections.  3. I discussed the importance of appropriate posture, stretching      modalities, etc..  She is sleeping on 2 pillows which she needs to      change.  Goal will be to keep head in neutral position when      sleeping.  4. Continue Rozerem for sleep.  She finds this is very beneficial.  5. Continue Lyrica and Effexor as previously dosed.  6. I will see her back in about 3 months' time.  I did refill her      oxycodone  post dated on August 31, 2008.      Ranelle Oyster, M.D.  Electronically Signed     ZTS/MedQ  D:  08/13/2008 10:11:56  T:  08/13/2008 16:10:96  Job #:  [id number]

## 2010-11-23 NOTE — Assessment & Plan Note (Signed)
[name] [name] is back regarding her fibromyalgia and multiple pain complaints.  She has done very nicely with the right knee brace we set her up with at  the last visit.  She had called the office stating that she was  withdrawing from her pain medications which she had not taken for  months.  Her family doctor recommended she followup with [place].  We told  her that she was not withdrawing and recommended further medical  followup.  Apparently it turned out that she has significant UTI.  Reports she is now on Macrodantin.  The patient rates her pain as 6-  8/10.  Has some pain in the hands, neck, feet, and knees.  Pain is  described as burning, aching, and constant.  Pain interferes with  general activity, relations with others, enjoyment of life on a moderate-  to-severe level.  Sleep is fair.  She has also had her thyroid  medications adjusted and notes some fluctuation there.   REVIEW OF SYSTEMS:  Notable for the above as well as some problem spasms  and occasional dizziness.  She has some depression, anxiety, shortness  of breath, limb swelling, coughing, constipation, rash, and high sugars.   SOCIAL HISTORY:  The patient is married.  Her husband remains  supportive.   PHYSICAL EXAMINATION:  VITAL SIGNS:  Blood pressure 122/74, pulse 34,  respiratory rate 18, and she is sating 97% on room air.  GENERAL:  The patient is pleasant, alert, and oriented x3.  Mood is very  calm and collective.  She walks with her knee brace and had the best  gait I have seen her.  She walked without a cane and had a good weight  shift in the center of gravity.  She was able to change directions  without problems.  She has had some pain throughout the knees with  resisted movements although this was improved.  She had some generalized  tenderness in the limbs today.  HEAR:  Regular rate.  CHEST:  Clear.  ABDOMEN:  Soft and nontender.  WEIGHT:  Stable although she does remain moderately overweight.    ASSESSMENT:  1. Lumbar spondylosis with degenerative disk disease and radiculitis      at L5-S1.  2. Osteoarthritis of the knees.  3. Bilateral rotator cuff syndrome.  4. Gait disorder.  5. Fibromyalgia.  6. Anxiety.  7. Urinary tract infection.   PLAN:  1. Recommended exercise and appropriate diet to help regulate sugars,      thyroid as well as to assist with pain management.  2. We will change her Effexor Extended Release to Regular Release      generic form 100 mg t.i.d. so she can acquire this through her      insurance.  3. We gave her multiple samples of Lyrica 50 mg which she can take      t.i.d.  4. Continue Tylox p.r.n. for more severe pain.  5. Consider alternative medical followup.  6. I will see her back in 6 months time.  In general she is doing well      from a management stand point.      Ranelle Oyster, M.D.  Electronically Signed     ZTS/MedQ  D:  02/20/2008 11:16:28  T:  02/21/2008 04:45:11  Job #:  [id number]

## 2010-11-23 NOTE — Assessment & Plan Note (Signed)
[facility]                         GASTROENTEROLOGY OFFICE NOTE   [name], [name] DISKIN                       MRN:          [id number]  DATE:05/22/2007                            DOB:          1945/08/28    REFERRING PHYSICIAN:  Dr. Margarito [name].   REASON FOR CONSULTATION:  Chest pain and dysphagia.   HISTORY:  This is a pleasant 65 year old white female with a history of  hypertension, asthma, hypothyroidism, osteoarthritis, anxiety,  depression, panic disorder, chronic headaches, prior cholecystectomy,  and chronic atypical chest pain.  The patient was evaluated in this  office February 09, 2005 regarding reflux, an abnormal barium esophagram,  epigastric pain, and dysphagia.  She underwent upper endoscopy February 14, 2005.  This was entirely normal, except for a small sliding hiatal  hernia.  She subsequently underwent esophageal monometry February 17, 2005.  This revealed nonspecific motility disorder.  Parenthetically,  complete colonoscopy February 14, 2005 was entirely normal.  She reports  ongoing problems with chest pain.  She recently saw Dr. Myrtis [name].  Thorough  evaluation revealed the pain to be noncardiac.  She went back and saw  Dr. Meredith [name] with ongoing complaints and was referred to this office.  For  her reflux disease, she is on Prevacid 30 mg b.i.d.  She continues with  dysphagia, principally to liquids.  Ongoing chest pains are without  change, though reportedly somewhat worse over the past 6 months.   PAST MEDICAL HISTORY:  As above.   PAST SURGICAL HISTORY:  Cholecystectomy.  Hysterectomy.  Breast surgery.  Normal cardiac catheterization.   ALLERGIES:  PENICILLIN, CODEINE, ANTIINFLAMMATORIES.   CURRENT MEDICATIONS:  Saline nasal spray.  Voltaren.  Effexor.  Prevacid.  Antacids.  Enalapril.  Tiazac.  Multivitamin.  Aspirin.  Lyrica.  Stool softener.  Clonazepam.  Zyrtec.  Levothroid.  Darvocet.   FAMILY HISTORY:  Brother and father with  colon polyps.   SOCIAL HISTORY:  The patient is married with 2 daughters.  She is  accompanied by her husband.  Previously employed in [employer].  Does not  smoke or use alcohol.   PHYSICAL EXAM:  Well-appearing female in no acute distress.  Blood pressure 140/84, heart rate 92, weight 201 pounds.  HEENT:  Sclerae anicteric.  Oral mucosa intact.  LUNGS:  Clear.  HEART:  Regular.  ABDOMEN:  Soft without tenderness, mass, or hernia.  No succussion  splash.   IMPRESSION:  Ongoing problems with dysphagia and chest pain, possibly  due to nonspecific esophageal motility disorder, as previously  identified.   RECOMMENDATIONS:  Her problems are really unchanged from a few years ago  and she has had extensive workup as outlined above.  At this point, I  would like to give her a trial of the antispasmodic, Levsin, to see if  this helps her discomfort.  If not, she can discontinue the medication.  Also, I would like to repeat an esophagram to see if there is any  significant change from a few years previous.  Otherwise, reassurance  was provided and she will return to Dr. Meredith [name] for her general medical  care.     [name] [name]. [name] Goodell, MD  Electronically Signed    JNP/MedQ  DD: 05/22/2007  DT: 05/23/2007  Job #: [id number]   cc:   [name] [name], M.D.

## 2010-11-23 NOTE — Assessment & Plan Note (Signed)
[name] [name] is back regarding her fibromyalgia.  She is still on the extended-  release Effexor which apparently was cheaper for her than going to the  short-acting.  I am not really sure why that was so.  She is using her  Lyrica and this seems to be helping her fibromyalgia symptoms.  She is  taking 150 mg t.i.d.  Uses Tylox for more severe pain.  She has lost  weight since I last saw her and she is proud of that.  She is  complaining of some numbness and tingling in the hands and arms that  takes place usually when she is asleep.  She also notes some spasms in  the hands.  Pain is 8-9/10.  Pain interferes with general activity,  relations with others, and enjoyment of life on a moderate level.  Sleep  is fluctuating due to some of the above symptoms.   REVIEW OF SYSTEMS:  Notable for occasional bowel and bladder problems.  Balance is still an issue at times.  She reports occasional fever,  constipation, nausea, vomiting, and abdominal pain.  Limb swelling is  also noted.   SOCIAL HISTORY:  The patient is married and husband is supportive.  No  specific changes are noted today.   PHYSICAL EXAMINATION:  VITAL SIGNS:  Blood pressure is 134/82, pulse is  72, respiratory rate 18, and she is saturating 96% on room air.  GENERAL:  The patient is pleasant, alert, and oriented x3.  Weight  appeared down.  MUSCULOSKELETAL:  She has good balance and movement today.  She did have  a particular area of pain and tenderness over the right thoracic  paraspinals near the rhomboids, approximately at the T4 level to T5.  Pain was reproduced with deep palpation as well as forward flexion  today.  Arms were negative for any Tinel signs.  She had good strength,  reflexes, and sensation today.  No bony or joint changes were seen  visually.  Hands were warm and of normal color.  Pulses were 2+.  HEART:  Regular.  CHEST:  Clear.  ABDOMEN:  Soft and nontender.   ASSESSMENT:  1. Lumbar spondylosis with  degenerative disk disease and radiculitis      at L5-S1.  2. Osteoarthritis of the knees.  3. Bilateral rotator cuff syndrome.  4. Gait disorder.  5. Fibromyalgia syndrome.  6. Myofascial pain particularly involving thoracic paraspinals related      to posture today.  7. Transient sensory loss in the hands.   PLAN:  1. I believe her hand symptoms are likely due to local traction and      compression of the distal superficial nerves on the elbow and      wrist.  Recommended just changing position when needed, shaking the      hands etc. to relieve chronic postures.  If the symptoms do      progress, we can locating further with nerve conduction testing.  2. Stay with her Effexor extended release 150 mg once a day.  3. We will start low-dose Skelaxin for some nighttime restless leg      type symptoms.  Some of the spasms that she is seeing may be      related to Lyrica as well.  4. She can stop her trazodone but continue Rozerem for sleep.  5. After an informed consent, we injected the 2 trigger points at T4,      T5 with 2 mL of 1% lidocaine.  The  patient      tolerated it well.  We discussed appropriate stretching, posture,      exercises at home etc.  6. We will see her back in 3 months' time.      Ranelle Oyster, M.D.  Electronically Signed     ZTS/MedQ  D:  05/28/2008 13:08:26  T:  05/29/2008 01:52:38  Job #:  [id number]

## 2010-11-23 NOTE — Assessment & Plan Note (Signed)
[name] [name] is back regarding her multiple pain complaints.  She is  complaining a lot of her right leg today.  It seems to be ascending  around her right knee which tends to buckle and bother her along the  medial aspect when she walks.  She saw Dr. Hilda Lias who stated that she  has arthritis.  No further recommendations were made according to her.  She rates her pain 8/10.  She has been given Tylox by Dr. Hilda Lias which  she feels has not helped her.  Sleep is fair.  She also complains of a  loose feeling in her neck she had a few weeks ago associated with some  popping.  Back bothers her when she is walking for a while as well as  some radiation into the front of the leg.  She last had her back  injected back in October which was the fourth of four injections that  year.   REVIEW OF SYSTEMS:  Notable for trouble walking, dizziness, tremor,  tingling, weight gain, high sugars at times, some constipation.  Limb  swelling as well is noted.   SOCIAL HISTORY:  The patient is married.  Husband is with her again  today.   PHYSICAL EXAMINATION:  VITAL SIGNS:  Blood pressure is 137/73, pulse is  100, respiratory rate 20, she is satting 98% on room air.  GENERAL:  The patient is pleasant, alert and oriented x3.  She had some  knee pain with medial stress on the knee today.  Meniscal testing was  positive.  She had no obvious crepitus appreciated.  Mild peripatellar  swelling was seen.  Strength was near 5/5 however.  MUSCULOSKELETAL:  Back was somewhat tender with palpation over the lower  lumbar paraspinal.  She was able to bend to approximately 50-60 degrees  without significant pain.  Straight leg testing/seated slump test today  were negative.  Cognitively she was generally appropriate.  Weight is  unchanged.  I think she may have some trace pretibial edema on the right  lower extremity.  HEART:  Slightly tachycardic.  CHEST:  Clear.  ABDOMEN:  Soft, nontender.   ASSESSMENT:  1. Lumbar  spondylosis with degenerative disk disease and radiculitis      at L5-S1.  2. History of bilateral knee pain, right more than left.  She seems to      have more medial compartment symptoms now.  3. Bilateral rotator cuff syndrome.  4. Gait disorder.  5. Fibromyalgia.  6. Anxiety.   PLAN:  1. Will have the patient set up with an osteoarthritis brace for the      right knee per [facility].  2. Recommend p.r.n. Aleve.  3. Refilled the Tylox #60.  4. Refilled Effexor, Rozerem and Lyrica again today.  5. Encouraged knee and back stabilizing exercise if able.  6. I will see her back in approximately 2 months' time.     Ranelle Oyster, M.D.  Electronically Signed    ZTS/MedQ  D:  11/23/2007 12:25:16  T:  11/23/2007 13:00:57  Job #:  [id number]

## 2010-11-24 ENCOUNTER — Ambulatory Visit (HOSPITAL_COMMUNITY)
Admission: RE | Admit: 2010-11-24 | Discharge: 2010-11-24 | Disposition: A | Discharge status: Home / Self Care | Payer: Medicare Other | Source: Ambulatory Visit | Attending: Physical Medicine & Rehabilitation | Admitting: Physical Medicine & Rehabilitation

## 2010-11-24 DIAGNOSIS — M5410 Radiculopathy, site unspecified: Secondary | ICD-10-CM

## 2010-11-24 DIAGNOSIS — M79606 Pain in leg, unspecified: Secondary | ICD-10-CM

## 2010-11-24 DIAGNOSIS — J45909 Unspecified asthma, uncomplicated: Secondary | ICD-10-CM | POA: Insufficient documentation

## 2010-11-24 DIAGNOSIS — M545 Low back pain, unspecified: Secondary | ICD-10-CM | POA: Insufficient documentation

## 2010-11-24 DIAGNOSIS — M51379 Other intervertebral disc degeneration, lumbosacral region without mention of lumbar back pain or lower extremity pain: Secondary | ICD-10-CM | POA: Insufficient documentation

## 2010-11-24 DIAGNOSIS — R29898 Other symptoms and signs involving the musculoskeletal system: Secondary | ICD-10-CM | POA: Insufficient documentation

## 2010-11-24 DIAGNOSIS — R209 Unspecified disturbances of skin sensation: Secondary | ICD-10-CM | POA: Insufficient documentation

## 2010-11-24 DIAGNOSIS — M79609 Pain in unspecified limb: Secondary | ICD-10-CM | POA: Insufficient documentation

## 2010-11-24 DIAGNOSIS — M5137 Other intervertebral disc degeneration, lumbosacral region: Secondary | ICD-10-CM | POA: Insufficient documentation

## 2010-11-24 DIAGNOSIS — E119 Type 2 diabetes mellitus without complications: Secondary | ICD-10-CM | POA: Insufficient documentation

## 2010-11-24 DIAGNOSIS — M48061 Spinal stenosis, lumbar region without neurogenic claudication: Secondary | ICD-10-CM | POA: Insufficient documentation

## 2010-11-24 DIAGNOSIS — M25559 Pain in unspecified hip: Secondary | ICD-10-CM | POA: Insufficient documentation

## 2010-11-24 DIAGNOSIS — I10 Essential (primary) hypertension: Secondary | ICD-10-CM | POA: Insufficient documentation

## 2010-11-26 NOTE — Cardiovascular Report (Signed)
[name], [name] [name]                          ACCOUNT NO.:  [id number]   MEDICAL RECORD NO.:  [id number]                   PATIENT TYPE:  OIB   LOCATION:  2899                                 FACILITY:  [facility]   PHYSICIAN:  Charlies Constable, M.D.                  DATE OF BIRTH:  1945-09-13   DATE OF PROCEDURE:  01/17/2003  DATE OF DISCHARGE:                              CARDIAC CATHETERIZATION   CLINICAL HISTORY:  [name] [name] is 65 years old and recently developed symptoms  of chest pain.  Seen by Dr. Jonny Ruiz who ordered a  Cardiolite scan.  This  suggested anterior ischemia, however, was concerned this could be due to  attenuation artifact.  She was seen in consultation by Dr. Myrtis Ser and  scheduled for evaluation with angiography.  She also has fibromyalgia.   PROCEDURE:  The procedure was performed via the right femoral artery using  arterial sheath and 6 French preformed coronary catheters.  A front wall  arterial  puncture was performed and Omnipaque contrast was used.  A distal  aortogram was performed to  rule out abdominal aortic aneurysm.  The right  femoral artery was closed with Perclose at the end of the procedure.  The  patient tolerated the procedure well and left the laboratory in satisfactory  condition.   RESULTS:  The aortic pressure was 143/83 with a mean of 109.  Left  ventricular pressure was 143/12.   Left main coronary artery:  The left main coronary was free of significant  disease.   Left anterior descending artery:  The left anterior descending artery gave  rise to two diagonal branches and four septal perforators.  These and the  LAD proper were free of significant disease.   Circumflex artery:  The circumflex artery gave rise to a marginal branch, an  AV branch and two posterior lateral branches.  These vessels were free of  significant disease.   The right coronary artery:  The right coronary artery was a moderate size  vessel that gave rise to a right  ventricular branch, posterior descending  branch and two posterior lateral branches.  These vessels were free of  significant disease.   LEFT VENTRICULOGRAM:  The left ventriculogram performed in the RAO  projection showed good wall motion with no areas of hypokinesis.  The  estimated ejection fraction was 60%.   DISTAL AORTOGRAM:  A distal aortogram showed patent renal arteries with no  renal artery stenosis.  There was no significant aortoiliac disease.   CONCLUSIONS:  Normal coronary angiography and left ventricular wall motion.    RECOMMENDATIONS:  Reassurance.  I am no certain regarding the etiology of  the patient's chest pain.  Will arrange followup with Dr. Posey Rea and if  her symptoms persist he can decide about further evaluation.  Charlies Constable, M.D.    BB/MEDQ  D:  01/17/2003  T:  01/17/2003  Job:  [id number]  Georgina Quint. Plotnikov, M.D. [facility]   Willa Rough, M.D.   cc:   Georgina Quint. Plotnikov, M.D. [facility]   Willa Rough, M.D.

## 2010-11-26 NOTE — Op Note (Signed)
NAMEXIAMARA, [name] [name]                ACCOUNT NO.:  [id number]   MEDICAL RECORD NO.:  [id number]          PATIENT TYPE:  AMB   LOCATION:  ENDO                         FACILITY:  [facility]   PHYSICIAN:  Wilhemina Bonito. Marina Goodell, M.D. [facility] OF BIRTH:  [birth year]   DATE OF PROCEDURE:  02/17/2005  DATE OF DISCHARGE:  02/17/2005                                 OPERATIVE REPORT   PROCEDURE PERFORMED:  Esophageal manometry.   INDICATIONS FOR PROCEDURE:  Dysphagia.   HISTORY:  This is a 66 year old female with liquid and occasionally solid  dysphagia.  Also a history of reflux.  She has barium esophagram and upper  endoscopy without mechanical obstruction noted.  She is felt to have  esophageal motility disorder.  She is now for manometry.   The patient reported to the [facility] laboratory.  Esophageal  manometry was performed in the standard manner.  The patient tolerated the  procedure well.   FINDINGS:  1.  The upper esophageal sphincter demonstrated normal relaxation and      coordination.  2.  The esophageal body demonstrated the presence of peristalsis with most      swallows.  However, about one third of the motility strips were abnormal      in that they were either nontransmitted, simultaneous or retrograde in      nature.  In addition, although the esophagus body amplitude was within      normal limits.  It was low normal.  3.  The lower esophageal sphincter demonstrated normal resting pressure of      approximately 13 mmHg.  Normal and complete relaxation with wet      swallowing was demonstrated.   IMPRESSION:  Nonspecific motility disorder of the esophagus.   RECOMMENDATIONS:  1.  Continue current medications for reflux symptoms.  2.  The patient will return to the care of her primary physicians.       JNP/MEDQ  D:  02/21/2005  T:  02/21/2005  Job:  [id number]

## 2010-12-10 ENCOUNTER — Encounter: Payer: Medicare Other | Attending: Physical Medicine & Rehabilitation | Admitting: Physical Medicine & Rehabilitation

## 2010-12-10 DIAGNOSIS — F341 Dysthymic disorder: Secondary | ICD-10-CM | POA: Insufficient documentation

## 2010-12-10 DIAGNOSIS — R269 Unspecified abnormalities of gait and mobility: Secondary | ICD-10-CM | POA: Insufficient documentation

## 2010-12-10 DIAGNOSIS — M545 Low back pain, unspecified: Secondary | ICD-10-CM | POA: Insufficient documentation

## 2010-12-10 DIAGNOSIS — IMO0002 Reserved for concepts with insufficient information to code with codable children: Secondary | ICD-10-CM

## 2010-12-10 DIAGNOSIS — IMO0001 Reserved for inherently not codable concepts without codable children: Secondary | ICD-10-CM

## 2010-12-10 DIAGNOSIS — G8929 Other chronic pain: Secondary | ICD-10-CM | POA: Insufficient documentation

## 2010-12-10 DIAGNOSIS — M159 Polyosteoarthritis, unspecified: Secondary | ICD-10-CM | POA: Insufficient documentation

## 2010-12-10 DIAGNOSIS — M47817 Spondylosis without myelopathy or radiculopathy, lumbosacral region: Secondary | ICD-10-CM | POA: Insufficient documentation

## 2010-12-10 DIAGNOSIS — M7530 Calcific tendinitis of unspecified shoulder: Secondary | ICD-10-CM

## 2010-12-10 DIAGNOSIS — M67919 Unspecified disorder of synovium and tendon, unspecified shoulder: Secondary | ICD-10-CM | POA: Insufficient documentation

## 2010-12-10 DIAGNOSIS — M719 Bursopathy, unspecified: Secondary | ICD-10-CM | POA: Insufficient documentation

## 2010-12-10 DIAGNOSIS — M5137 Other intervertebral disc degeneration, lumbosacral region: Secondary | ICD-10-CM | POA: Insufficient documentation

## 2010-12-10 DIAGNOSIS — M51379 Other intervertebral disc degeneration, lumbosacral region without mention of lumbar back pain or lower extremity pain: Secondary | ICD-10-CM | POA: Insufficient documentation

## 2010-12-10 NOTE — Assessment & Plan Note (Signed)
[name] is back regarding her chronic pain.  She states that over the last few weeks she is having increased pain and feels more fatigued and achy all over.  She is having a lot of pain in her right shoulder.  She has had documented rotator cuff problems in the past.  She rates her pain 7-9/10.  Pain is constant.  She saw her endocrinologist recently, did some lab workup, but she does not know what all she threw.  On assessment of sleep, she states this is fair.  She has a hard time, however, getting out of bed.  She has been not doing as much physically as of late as her husband, had some medical issues of his own.  Denies any cough for GI symptoms.  She has had some urinary frequency.  Does complain of fever and chills.  REVIEW OF SYSTEMS:  Notable for the above.  Full 12-point review is in the written health and history section of the chart.  She does have some low back pain and numbness in the legs at times.  RADIOGRAPHY:  Reviewed her MRI and really had no significant changes of note.  She has persistent lumbar spondylosis and degenerative disk disease which we noted in the past.  SOCIAL HISTORY:  Noted above.  Denies any new real stressors in her life.  PHYSICAL EXAMINATION:  VITAL SIGNS:  Blood pressure is 129/60, pulse is 91, respiratory rate 18, and she is satting 96% on room air. MUSCULOSKELETAL:  She has pain in the right shoulder with palpation laterally along these subdeltoid bursa and with impingement maneuvers. The anterior shoulder seemed to be less tender.  She has impaired range of motion with active abduction, external and internal rotation. Otherwise strength was near 5/5 in all four limbs.  Sensory exam is grossly intact.  She had 1+ reflexes.  Cognitively, she is at baseline. Posture is fair. HEART:  Regular. CHEST:  Clear. ABDOMEN:  Soft and nontender.  ASSESSMENT: 1. Chronic low back pain related to lumbar spondylosis and     degenerative disk disease.   She has had some radiculitis in the     past. 2. Fibromyalgia syndrome with myofascial pain. 3. Right rotator cuff syndrome/subdeltoid bursitis. 4. Osteoarthritis of the knees and feet. 5. Gait disorder. 6. Anxiety with depression.  PLAN: 1. After informed consent we injected via lateral approach the     subacromial space with attention also to the subdeltoid bursa using     a total of 3 mL of 1% lidocaine and 40 mg Kenalog.  The patient     tolerated well. 2. Ask the patient follow up with her physician regarding lab work and     check.  Certainly would like check a vitamin D level perhaps     thyroid tests, etc. 3. I did order a urinalysis culture today. 4. Consider further workup depending on how she received here.  I did     change her Vicodin back to oxycodone for pain control. 5. Encouraged her to get out of the house and get the [facility] where she     has a Research scientist (physical sciences). 6. I will see her back here in about a month depending on results of     her testing.     Ranelle Oyster, M.D. Electronically Signed    ZTS/MedQ D:  12/10/2010 12:39:19  T:  12/10/2010 19:35:38  Job #:  [id number]

## 2011-01-01 ENCOUNTER — Emergency Department (HOSPITAL_COMMUNITY): Payer: Medicare Other

## 2011-01-01 ENCOUNTER — Emergency Department (HOSPITAL_COMMUNITY)
Admission: EM | Admit: 2011-01-01 | Discharge: 2011-01-02 | Disposition: A | Discharge status: Home / Self Care | Payer: Medicare Other | Attending: Emergency Medicine | Admitting: Emergency Medicine

## 2011-01-01 DIAGNOSIS — Z9089 Acquired absence of other organs: Secondary | ICD-10-CM | POA: Insufficient documentation

## 2011-01-01 DIAGNOSIS — I10 Essential (primary) hypertension: Secondary | ICD-10-CM | POA: Insufficient documentation

## 2011-01-01 DIAGNOSIS — R079 Chest pain, unspecified: Secondary | ICD-10-CM | POA: Insufficient documentation

## 2011-01-01 DIAGNOSIS — N390 Urinary tract infection, site not specified: Secondary | ICD-10-CM | POA: Insufficient documentation

## 2011-01-01 DIAGNOSIS — J9819 Other pulmonary collapse: Secondary | ICD-10-CM | POA: Insufficient documentation

## 2011-01-01 DIAGNOSIS — R109 Unspecified abdominal pain: Secondary | ICD-10-CM | POA: Insufficient documentation

## 2011-01-01 DIAGNOSIS — K219 Gastro-esophageal reflux disease without esophagitis: Secondary | ICD-10-CM | POA: Insufficient documentation

## 2011-01-01 DIAGNOSIS — Z79899 Other long term (current) drug therapy: Secondary | ICD-10-CM | POA: Insufficient documentation

## 2011-01-01 DIAGNOSIS — M069 Rheumatoid arthritis, unspecified: Secondary | ICD-10-CM | POA: Insufficient documentation

## 2011-01-01 DIAGNOSIS — Z90710 Acquired absence of both cervix and uterus: Secondary | ICD-10-CM | POA: Insufficient documentation

## 2011-01-01 LAB — URINALYSIS, ROUTINE W REFLEX MICROSCOPIC
Glucose, UA: NEGATIVE mg/dL
Hgb urine dipstick: NEGATIVE
Protein, ur: NEGATIVE mg/dL
Specific Gravity, Urine: 1.018 (ref 1.005–1.030)
Urobilinogen, UA: 1 mg/dL (ref 0.0–1.0)

## 2011-01-01 LAB — COMPREHENSIVE METABOLIC PANEL
AST: 18 U/L (ref 0–37)
Albumin: 4.2 g/dL (ref 3.5–5.2)
Chloride: 101 mEq/L (ref 96–112)
Creatinine, Ser: 0.6 mg/dL (ref 0.50–1.10)
Potassium: 3.7 mEq/L (ref 3.5–5.1)
Sodium: 138 mEq/L (ref 135–145)
Total Bilirubin: 0.2 mg/dL — ABNORMAL LOW (ref 0.3–1.2)

## 2011-01-01 LAB — CBC
Platelets: 261 10*3/uL (ref 150–400)
RDW: 13.8 % (ref 11.5–15.5)
WBC: 4.5 10*3/uL (ref 4.0–10.5)

## 2011-01-01 LAB — URINE MICROSCOPIC-ADD ON

## 2011-01-01 LAB — DIFFERENTIAL
Basophils Absolute: 0 10*3/uL (ref 0.0–0.1)
Basophils Relative: 0 % (ref 0–1)
Eosinophils Absolute: 0 10*3/uL (ref 0.0–0.7)
Eosinophils Relative: 1 % (ref 0–5)

## 2011-01-01 LAB — CK TOTAL AND CKMB (NOT AT ARMC): CK, MB: 1.9 ng/mL (ref 0.3–4.0)

## 2011-01-02 LAB — GLUCOSE, CAPILLARY: Glucose-Capillary: 99 mg/dL (ref 70–99)

## 2011-01-02 MED ORDER — IOHEXOL 300 MG/ML  SOLN
125.0000 mL | Freq: Once | INTRAMUSCULAR | Status: AC | PRN
  Administered 2011-01-02: 125 mL via INTRAVENOUS

## 2011-01-03 ENCOUNTER — Ambulatory Visit: Payer: Medicare Other | Admitting: Physical Medicine & Rehabilitation

## 2011-01-03 ENCOUNTER — Encounter: Payer: Medicare Other | Attending: Physical Medicine & Rehabilitation | Admitting: Physical Medicine & Rehabilitation

## 2011-01-03 DIAGNOSIS — M545 Low back pain, unspecified: Secondary | ICD-10-CM | POA: Insufficient documentation

## 2011-01-03 DIAGNOSIS — M67919 Unspecified disorder of synovium and tendon, unspecified shoulder: Secondary | ICD-10-CM | POA: Insufficient documentation

## 2011-01-03 DIAGNOSIS — IMO0001 Reserved for inherently not codable concepts without codable children: Secondary | ICD-10-CM

## 2011-01-03 DIAGNOSIS — M719 Bursopathy, unspecified: Secondary | ICD-10-CM | POA: Insufficient documentation

## 2011-01-03 DIAGNOSIS — M19079 Primary osteoarthritis, unspecified ankle and foot: Secondary | ICD-10-CM | POA: Insufficient documentation

## 2011-01-03 DIAGNOSIS — IMO0002 Reserved for concepts with insufficient information to code with codable children: Secondary | ICD-10-CM

## 2011-01-03 DIAGNOSIS — F341 Dysthymic disorder: Secondary | ICD-10-CM | POA: Insufficient documentation

## 2011-01-03 DIAGNOSIS — H8110 Benign paroxysmal vertigo, unspecified ear: Secondary | ICD-10-CM

## 2011-01-03 DIAGNOSIS — R269 Unspecified abnormalities of gait and mobility: Secondary | ICD-10-CM | POA: Insufficient documentation

## 2011-01-03 DIAGNOSIS — M1710 Unilateral primary osteoarthritis, unspecified knee: Secondary | ICD-10-CM | POA: Insufficient documentation

## 2011-01-03 DIAGNOSIS — M47817 Spondylosis without myelopathy or radiculopathy, lumbosacral region: Secondary | ICD-10-CM | POA: Insufficient documentation

## 2011-01-03 DIAGNOSIS — M19019 Primary osteoarthritis, unspecified shoulder: Secondary | ICD-10-CM

## 2011-01-04 NOTE — Assessment & Plan Note (Signed)
[name] [name] is back regarding her multiple pain complaints.  She had good results with the shoulder injection at her last visit.  She complained of lot of intrascapular pain currently.  Her vitamin D level was apparently normal per PCP.  She has had problems with gastritis and gas.  The patient rates her pain as 7-9/10, described as dull and aching. Sleep is fair.  REVIEW OF SYSTEMS:  Notable for the above.  Full 12-point review is written health and history section of the chart.  She does report notable constipation and she uses MiraLax only as needed for this.  SOCIAL HISTORY:  Unchanged.  Husband is with her today and remains supportive.  PHYSICAL EXAMINATION:  VITAL SIGNS:  Blood pressure is 117/69, pulse 64, respiratory 18, and she is saturating 95% on room air. GENERAL:  The patient is pleasant and alert.  She has definite palpable band of muscle on the T10 to T4/T3 levels.  She walks with minimal antalgia today.  Shoulder is much improved on the right with active range of motion today.  Standing and sitting posture is fair.  ASSESSMENT: 1. Chronic low back pain related to lumbar spondylosis and     degenerative disk disease with residual radiculitis. 2. Fibromyalgia syndrome with myofascial pain. 3. Right rotator cuff syndrome. 4. Osteoarthritis of the knees and feet. 5. Gait disorder. 6. Anxiety with depression.  PLAN: 1. After informed consent, at the T6 level, approximately we injected     3 levels of lumbar paraspinal muscles that were fairly taut, seemed     to be responsible for the greater part of her pain.  The patient     tolerated it well without any problems today. 2. I refilled her oxycodone 5/500 one q.6 h p.r.n. #60. 3. I discussed appropriate posture and technique which we have     discussed already in the past. 4. Discussed regular bowel management to keep her stools more regular     and to avoid excessive constipation. 5. I will see her back here in about  3-4 months.  She will follow up     with my nurse practitioner in about a month.     Ranelle Oyster, M.D. Electronically Signed    ZTS/MedQ D:  01/03/2011 14:18:45  T:  01/04/2011 00:49:24  Job #:  [id number]

## 2011-01-24 ENCOUNTER — Ambulatory Visit (INDEPENDENT_AMBULATORY_CARE_PROVIDER_SITE_OTHER): Payer: Medicare Other | Admitting: Internal Medicine

## 2011-01-24 ENCOUNTER — Encounter: Payer: Self-pay | Admitting: Internal Medicine

## 2011-01-24 VITALS — BP 120/80 | HR 86 | Ht 66.0 in | Wt 195.0 lb

## 2011-01-24 DIAGNOSIS — K219 Gastro-esophageal reflux disease without esophagitis: Secondary | ICD-10-CM

## 2011-01-24 DIAGNOSIS — K5900 Constipation, unspecified: Secondary | ICD-10-CM

## 2011-01-24 DIAGNOSIS — R1084 Generalized abdominal pain: Secondary | ICD-10-CM

## 2011-01-24 NOTE — Progress Notes (Signed)
HISTORY OF PRESENT ILLNESS:  [name] [name] is a 65 y.o. female with multiple medical problems including hypertension, hypothyroidism, fibromyalgia for which she is on chronic pain medication, anxiety/depression on medical therapy, diabetes mellitus, TIA, obesity, chronic back pain, GERD, esophageal dysmotility, chronic constipation, and noncardiac chest pain. She presents today for evaluation of abdominal pain. She is accompanied by her husband who participates in the encounter. She was last seen in April of 2012 when she underwent complete colonoscopy to evaluate rectal bleeding and change in bowel habits. Examination was normal. Followup in 10 years recommended. They tell me that she began having problems with increased intestinal gas and constipation which culminated in an emergency room visit 01/01/2011. Multiple laboratories including CBC, comprehensive metabolic panel, and serum lipase were normal. Negative cardiac enzymes. She also underwent a CT scan of the abdomen and pelvis which was unremarkable. She has been using MiraLax for constipation, but she is currently out. They are about the prospect of trying a probiotic for some of her GI complaints. GI complaints include vague abdominal pain(she cannot identify exacerbating or relieving factors and can only describe the pain as "it hurts"), heartburn, bloating, belching, chest pain, dysphagia, variable appetite, very weight, anal discomfort, constipation, change in stool caliber. She is status post cholecystectomy  REVIEW OF SYSTEMS:  All non-GI ROS negative except for anxiety, arthritis, back pain, visual change, cough, depression, fatigue, headaches, muscle cramps, night sweats, day sweats, nosebleeds, short of breath, sleeping problems, ankle edema, excessive thirst, urinary leakage, voice change  Past Medical History  Diagnosis Date  . DM type 2 (diabetes mellitus, type 2)   . TIA (transient ischemic attack)     MRI 2007 no changes.  There is  question of ischemia versus a demyelinating process.  . Anxiety   . GERD (gastroesophageal reflux disease)   . HTN (hypertension)   . Hypothyroidism   . Depression   . Allergic rhinitis   . Fibromyalgia   . Back pain   . Hemorrhoids   . Migraine headache   . Chest pain     Catheterization 2004 normal coronary arteries  /  nuclear August 2 007, no ischemia  . Joint pain     Pains in multiple joints  . Shortness of breath     March, 2012  . Elevated alkaline phosphatase level   . Dysphagia   . Hiatal hernia   . Abdominal pain, RUQ (right upper quadrant)   . Hypercholesteremia   . Hematochezia   . UTI (lower urinary tract infection)     Past Surgical History  Procedure Date  . Cholecystectomy   . Abdominal hysterectomy 1984  . Breast surgery   . Cardiac catheterization   . Thyroidectomy   . Neuromas     removed from feet  . Wrist surgery     left    Social History [name] [name]  reports that she has never smoked. She has never used smokeless tobacco. She reports that she does not drink alcohol or use illicit drugs.  family history includes Cancer in some unspecified family members; Coronary artery disease in her father; Diabetes in her father and mother; and Leukemia in her sister.  Allergies  Allergen Reactions  . Diclofenac Sodium   . Nsaids     REACTION: Throat swelling  . Penicillins     REACTION: RASH, TONGUE SWELLING       PHYSICAL EXAMINATION: Vital signs: BP 120/80  Pulse 86  Ht 5\' 6"  (1.676 m)  Wt  195 lb (88.451 kg)  BMI 31.47 kg/m2  Constitutional: generally well-appearing, no acute distress Psychiatric: alert and oriented x3, cooperative Eyes: extraocular movements intact, anicteric, conjunctiva pink Mouth: oral pharynx moist, no lesions Neck: supple no lymphadenopathy Cardiovascular: heart regular rate and rhythm, no murmur Lungs: clear to auscultation bilaterally Abdomen: soft, nontender, nondistended, no obvious ascites, no peritoneal  signs, normal bowel sounds, no organomegaly Rectal: Ommitted. Normal in April 2012 Extremities: no lower extremity edema bilaterally Skin: no lesions on visible extremities Neuro: No focal deficits.   ASSESSMENT:  #1. Vague abdominal discomfort. Organic etiology identified. Possibly related to constipation. Possibly functional #2. Constipation. Functional. Negative colonoscopy 3 months ago #3. GERD #4. Nonspecific esophageal motility disorder #5. Multiple medical problems including fibromyalgia and anxiety/depression   PLAN:  #1. MiraLax. Titrate to achieve one to 2 bowel movements daily #2. Reassurance #3. Reflux precautions and continue PPI #4. Routine followup colonoscopy in 10 years #5. Return to the care of Dr.Joines

## 2011-01-24 NOTE — Patient Instructions (Signed)
Miralax to achieve daily BMs. Ok to try Probiotic. Follow-up as needed.

## 2011-01-25 ENCOUNTER — Encounter: Payer: Self-pay | Admitting: Internal Medicine

## 2011-01-27 ENCOUNTER — Other Ambulatory Visit: Payer: Self-pay | Admitting: Internal Medicine

## 2011-01-27 DIAGNOSIS — E039 Hypothyroidism, unspecified: Secondary | ICD-10-CM

## 2011-01-31 ENCOUNTER — Encounter: Payer: Medicare Other | Attending: Neurosurgery | Admitting: Neurosurgery

## 2011-01-31 DIAGNOSIS — M47817 Spondylosis without myelopathy or radiculopathy, lumbosacral region: Secondary | ICD-10-CM

## 2011-01-31 DIAGNOSIS — M719 Bursopathy, unspecified: Secondary | ICD-10-CM | POA: Insufficient documentation

## 2011-01-31 DIAGNOSIS — IMO0001 Reserved for inherently not codable concepts without codable children: Secondary | ICD-10-CM | POA: Insufficient documentation

## 2011-01-31 DIAGNOSIS — G8929 Other chronic pain: Secondary | ICD-10-CM | POA: Insufficient documentation

## 2011-01-31 DIAGNOSIS — M67919 Unspecified disorder of synovium and tendon, unspecified shoulder: Secondary | ICD-10-CM | POA: Insufficient documentation

## 2011-01-31 DIAGNOSIS — M1710 Unilateral primary osteoarthritis, unspecified knee: Secondary | ICD-10-CM | POA: Insufficient documentation

## 2011-01-31 DIAGNOSIS — F341 Dysthymic disorder: Secondary | ICD-10-CM | POA: Insufficient documentation

## 2011-01-31 DIAGNOSIS — M545 Low back pain, unspecified: Secondary | ICD-10-CM

## 2011-02-01 NOTE — Assessment & Plan Note (Signed)
[name] [name] is a patient of Dr. Riley Kill seen for multiple pain complaints. Today, her Oswestry score is 42.  She rates her pain at 4-8 constant pain.  Activity level is 6-8.  Pain is worse during the day.  Sleep patterns are good.  Pain is worse with all activities.  Rest medications, and injections tend to help mobility.  She does use a cane from time to time.  She can climb steps.  She drives.  She can walk about 30 minutes.  She is on disability.  REVIEW OF SYSTEMS:  Notable for difficulties described above as well as some abdominal pain, limb swelling, bowel and bladder issues, paresthesias, depression, anxiety.  No suicidal thoughts.  Her past medical history, social history, and family history are unchanged.  PHYSICAL EXAMINATION:  Blood pressure 130/73, pulse 71, respirations 18, O2 sats 93 on room air.  Motor strength 5/5 in lower extremities. Constitutionally, she is obese.  She is alert and oriented x3.  She is with walking with a cane today and does have a limp.  The patient is 2- week short on her oxycodone.  Confirmed with Dr. Riley Kill she will be issued a letter of warning.  I did tell her that I would not write a prescription for the oxycodone today.  She will have to talk to Dr. Riley Kill about that.  She knows she will be discharged.  We are going to issue a formal letter of warning, any further noncompliance she will be discharged from the clinic.  She stated understanding.  ASSESSMENT: 1. Chronic low back pain. 2. Fibromyalgia. 3. Rotator cuff syndrome. 4. Osteoarthritis of the knees. 5. Anxiety and depression.  PLAN: 1. As stated above, she is going to be issued a letter or warning for     discharge for noncompliance. 2. Robaxin 500 mg 1 p.o. b.i.d. 60 with no refill.  She will follow up     with Dr. Riley Kill in 1 month.  Her questions were encouraged and     answered.     [name] [name] Electronically Signed    RLW/MedQ D:  01/31/2011 14:53:31  T:   02/01/2011 02:04:38  Job #:  [id number]

## 2011-02-22 ENCOUNTER — Other Ambulatory Visit: Payer: Self-pay | Admitting: Internal Medicine

## 2011-02-22 DIAGNOSIS — Z1231 Encounter for screening mammogram for malignant neoplasm of breast: Secondary | ICD-10-CM

## 2011-02-25 ENCOUNTER — Other Ambulatory Visit: Payer: Self-pay | Admitting: Internal Medicine

## 2011-02-28 ENCOUNTER — Encounter: Payer: Medicare Other | Attending: Neurosurgery | Admitting: Neurosurgery

## 2011-02-28 DIAGNOSIS — IMO0001 Reserved for inherently not codable concepts without codable children: Secondary | ICD-10-CM | POA: Insufficient documentation

## 2011-02-28 DIAGNOSIS — M545 Low back pain, unspecified: Secondary | ICD-10-CM

## 2011-02-28 DIAGNOSIS — M25529 Pain in unspecified elbow: Secondary | ICD-10-CM

## 2011-02-28 DIAGNOSIS — R109 Unspecified abdominal pain: Secondary | ICD-10-CM | POA: Insufficient documentation

## 2011-02-28 DIAGNOSIS — R079 Chest pain, unspecified: Secondary | ICD-10-CM | POA: Insufficient documentation

## 2011-02-28 DIAGNOSIS — F341 Dysthymic disorder: Secondary | ICD-10-CM

## 2011-02-28 DIAGNOSIS — M719 Bursopathy, unspecified: Secondary | ICD-10-CM | POA: Insufficient documentation

## 2011-02-28 DIAGNOSIS — M79609 Pain in unspecified limb: Secondary | ICD-10-CM | POA: Insufficient documentation

## 2011-02-28 DIAGNOSIS — M67919 Unspecified disorder of synovium and tendon, unspecified shoulder: Secondary | ICD-10-CM | POA: Insufficient documentation

## 2011-02-28 DIAGNOSIS — G8929 Other chronic pain: Secondary | ICD-10-CM | POA: Insufficient documentation

## 2011-03-01 ENCOUNTER — Ambulatory Visit (HOSPITAL_COMMUNITY): Payer: Medicare Other

## 2011-03-01 NOTE — Assessment & Plan Note (Signed)
This is a patient of Dr. Riley Kill seen for multiple pain complaints.  She states that she is having pain in her arms as well as chest and abdomen, in the right leg last month.  The patient was short on her pill count. She was issued letter of warning regarding her narcotics by Dr. Riley Kill and she was not given a prescription.  Since that time, she has been off her husband and she had talked about staying off the narcotics a total month.  That was a good idea.  We are going to try some anti- inflammatory.  Her Oswestry score today is 52.  She rates her pain as 7 or 8.  It is constant aching pain.  I have explained the patient that I do not think her abdominal pain as anything do with her back given the fact her MRI was fairly unremarkable.  General activity level is 9.  The pain is worse at day than night.  Sleep patterns are poor.  She does not indicate what aggravates and improves her pain.  She uses a cane for ambulation.  She can walk about 30 minutes at a time.  She is on disability.  REVIEW OF SYSTEMS:  Notable for the difficulties described above as well as some recent onset abdominal pain with vomiting.  She has some limb swelling, dizziness, tingling, bladder and bowel control issues, weakness as well as depression, anxiety, no suicidal thoughts.  PAST MEDICAL HISTORY, SOCIAL HISTORY AND FAMILY HISTORY:  Unchanged.  PHYSICAL EXAMINATION:  VITAL SIGNS:  Blood pressure 140/85, pulse 91, respirations 15 and O2 sats 98 on room air.  Motor strength is good in the lower extremities.  Her sensation is intact.  She is somewhat depressed. NEUROLOGIC:  Constitutionally, she is within normal limits.  She is alert and oriented x3.  ASSESSMENT: 1. Chronic low back pain. 2. Fibromyalgia. 3. History of rotator cuff syndrome. 4. Evaluate the knees. 5. Anxiety, depression.  PLAN: 1. We are going to start on Naprosyn 500 mg 1 p.o. b.i.d.  I gave her     prescription for 60 with 2  refills. 2. Continue the Robaxin b.i.d. as needed.  She has refills on that.     We are going to discontinue the narcotics.  She will follow up here     in 1 month.  Her questions were encouraged and answered.     Dlynn Ranes L. Blima Dessert Electronically Signed    RLW/MedQ D:  02/28/2011 14:50:16  T:  03/01/2011 01:21:08  Job #:  [id number]

## 2011-03-08 ENCOUNTER — Ambulatory Visit: Payer: Medicare Other | Attending: Physical Medicine and Rehabilitation

## 2011-03-08 DIAGNOSIS — M545 Low back pain, unspecified: Secondary | ICD-10-CM | POA: Insufficient documentation

## 2011-03-08 DIAGNOSIS — M2560 Stiffness of unspecified joint, not elsewhere classified: Secondary | ICD-10-CM | POA: Insufficient documentation

## 2011-03-08 DIAGNOSIS — IMO0001 Reserved for inherently not codable concepts without codable children: Secondary | ICD-10-CM | POA: Insufficient documentation

## 2011-03-08 DIAGNOSIS — R5381 Other malaise: Secondary | ICD-10-CM | POA: Insufficient documentation

## 2011-03-09 ENCOUNTER — Encounter: Payer: Self-pay | Admitting: Internal Medicine

## 2011-03-09 ENCOUNTER — Ambulatory Visit (INDEPENDENT_AMBULATORY_CARE_PROVIDER_SITE_OTHER): Payer: Medicare Other | Admitting: Internal Medicine

## 2011-03-09 VITALS — BP 135/75 | HR 68 | Temp 98.6°F | Wt 195.9 lb

## 2011-03-09 DIAGNOSIS — K219 Gastro-esophageal reflux disease without esophagitis: Secondary | ICD-10-CM

## 2011-03-09 DIAGNOSIS — E119 Type 2 diabetes mellitus without complications: Secondary | ICD-10-CM

## 2011-03-09 DIAGNOSIS — I10 Essential (primary) hypertension: Secondary | ICD-10-CM

## 2011-03-09 DIAGNOSIS — R350 Frequency of micturition: Secondary | ICD-10-CM

## 2011-03-09 LAB — POCT URINALYSIS DIPSTICK
Bilirubin, UA: NEGATIVE
Glucose, UA: NEGATIVE
Nitrite, UA: NEGATIVE
Urobilinogen, UA: 0.2

## 2011-03-09 LAB — POCT GLYCOSYLATED HEMOGLOBIN (HGB A1C): Hemoglobin A1C: 5.7

## 2011-03-09 MED ORDER — OMEPRAZOLE 20 MG PO CPDR: 40.0000 mg | DELAYED_RELEASE_CAPSULE | Freq: Two times a day (BID) | ORAL | Status: DC

## 2011-03-09 NOTE — Progress Notes (Signed)
  Subjective:    Patient ID: [name] [name], female    DOB: 10-Nov-1945, 65 y.o.   MRN: [id number]  HPI Patient returns for followup of her GERD, hypertension, and other chronic medical problems; she also has an acute complaint of recent urinary frequency and urgency for the past 3 days.  She denies dysuria.  She does report some nausea with vomiting last week which has since resolved, and attributes this to reflux; she says that her reflux symptoms have recently increased, and describes this as a feeling of indigestion.  She reports that she is compliant with her medications but her   Review of Systems  Constitutional: Negative for fever and chills.  Gastrointestinal: Positive for abdominal pain (Chronic RLQ pain off and on, worked up by Dr. Marina Goodell according to patient.). Negative for nausea, vomiting (reports some N/V last week, now resolved.), constipation and blood in stool.  Genitourinary: Positive for urgency.       Objective:   Physical Exam  Cardiovascular: Normal rate, regular rhythm and normal heart sounds.  Exam reveals no gallop and no friction rub.   No murmur heard. Pulmonary/Chest: Effort normal and breath sounds normal. No respiratory distress. She has no wheezes. She has no rales.  Abdominal: Soft. Bowel sounds are normal. There is tenderness (mild right lower quadrant tenderness).  Musculoskeletal: She exhibits no edema.          Assessment & Plan:

## 2011-03-09 NOTE — Assessment & Plan Note (Signed)
Patient reports increased GERD symptoms on the omeprazole 40 mg daily.  She reports having better control in the past during exacerbations on a dose of 40 mg twice a day.  The plan is to increase omeprazole to a dose of 40 mg twice a day until symptoms improve.

## 2011-03-09 NOTE — Assessment & Plan Note (Signed)
Lab Results  Component Value Date   NA 138 01/01/2011   K 3.7 01/01/2011   CL 101 01/01/2011   CO2 29 01/01/2011   BUN 8 01/01/2011   CREATININE 0.60 01/01/2011   CREATININE 0.79 11/03/2010    BP Readings from Last 3 Encounters:  03/09/11 135/75  01/24/11 120/80  10/27/10 139/83    Assessment: Hypertension control:  mildly elevated  Progress toward goals:  unchanged Barriers to meeting goals:  no barriers identified  Plan: Hypertension treatment:  continue current medications

## 2011-03-09 NOTE — Assessment & Plan Note (Signed)
Lab Results  Component Value Date   HGBA1C 5.7 03/09/2011   HGBA1C 5.5 07/28/2010   CREATININE 0.60 01/01/2011   CREATININE 0.79 11/03/2010   MICROALBUR 0.75 05/19/2010   MICRALBCREAT 9.0 05/19/2010   CHOL 190 11/03/2010   HDL 67 11/03/2010   TRIG 153* 11/03/2010    Assessment: Diabetes control: controlled Progress toward goals: at goal Barriers to meeting goals: no barriers identified  Plan: Diabetes treatment: continue current medications Patient reports taking her metformin on an as-needed basis based upon her fingerstick blood glucose measurements.  I advised her to discuss with her endocrinologist whether she needs to be on metformin at all given her excellent control of her hemoglobin A1c Refer to: none

## 2011-03-09 NOTE — Assessment & Plan Note (Signed)
Patient reports urinary frequency with some urgency for the past 3 days; she has no systemic symptoms and no dysuria.  A urine dipstick in clinic was positive for a small amount of leukocyte esterase.  The plan is to send a full urinalysis and culture, and treat if indicated based on the results.

## 2011-03-09 NOTE — Patient Instructions (Addendum)
Increase omeprazole 20 mg to a dose of 2 capsules twice a day. Please call in 2 days for the results of your urine culture if we have not contacted you. Please discuss your metformin dosing with her endocrinologist.

## 2011-03-10 ENCOUNTER — Ambulatory Visit: Payer: Medicare Other

## 2011-03-10 LAB — URINALYSIS, ROUTINE W REFLEX MICROSCOPIC
Bilirubin Urine: NEGATIVE
Glucose, UA: NEGATIVE mg/dL
Ketones, ur: NEGATIVE mg/dL
Specific Gravity, Urine: 1.006 (ref 1.005–1.030)
Urobilinogen, UA: 0.2 mg/dL (ref 0.0–1.0)

## 2011-03-10 LAB — URINE CULTURE: Colony Count: 4000

## 2011-03-10 LAB — URINALYSIS, MICROSCOPIC ONLY
Casts: NONE SEEN
Crystals: NONE SEEN
Squamous Epithelial / LPF: NONE SEEN

## 2011-03-15 ENCOUNTER — Ambulatory Visit: Payer: Medicare Other | Attending: Physical Medicine and Rehabilitation

## 2011-03-15 DIAGNOSIS — M545 Low back pain, unspecified: Secondary | ICD-10-CM | POA: Insufficient documentation

## 2011-03-15 DIAGNOSIS — M2560 Stiffness of unspecified joint, not elsewhere classified: Secondary | ICD-10-CM | POA: Insufficient documentation

## 2011-03-15 DIAGNOSIS — R5381 Other malaise: Secondary | ICD-10-CM | POA: Insufficient documentation

## 2011-03-15 DIAGNOSIS — IMO0001 Reserved for inherently not codable concepts without codable children: Secondary | ICD-10-CM | POA: Insufficient documentation

## 2011-03-17 ENCOUNTER — Ambulatory Visit: Payer: Medicare Other | Admitting: Physical Therapy

## 2011-03-22 ENCOUNTER — Ambulatory Visit: Payer: Medicare Other | Admitting: Physical Therapy

## 2011-03-24 ENCOUNTER — Ambulatory Visit: Payer: Medicare Other | Admitting: Physical Therapy

## 2011-03-28 ENCOUNTER — Telehealth: Payer: Self-pay | Admitting: *Deleted

## 2011-03-28 ENCOUNTER — Encounter: Payer: Self-pay | Admitting: Internal Medicine

## 2011-03-28 ENCOUNTER — Ambulatory Visit (INDEPENDENT_AMBULATORY_CARE_PROVIDER_SITE_OTHER): Payer: Medicare Other | Admitting: Internal Medicine

## 2011-03-28 DIAGNOSIS — Z299 Encounter for prophylactic measures, unspecified: Secondary | ICD-10-CM

## 2011-03-28 DIAGNOSIS — K219 Gastro-esophageal reflux disease without esophagitis: Secondary | ICD-10-CM

## 2011-03-28 DIAGNOSIS — Z23 Encounter for immunization: Secondary | ICD-10-CM

## 2011-03-28 DIAGNOSIS — E119 Type 2 diabetes mellitus without complications: Secondary | ICD-10-CM

## 2011-03-28 DIAGNOSIS — N949 Unspecified condition associated with female genital organs and menstrual cycle: Secondary | ICD-10-CM

## 2011-03-28 LAB — GLUCOSE, CAPILLARY: Glucose-Capillary: 97 mg/dL (ref 70–99)

## 2011-03-28 MED ORDER — OMEPRAZOLE 40 MG PO CPDR
40.0000 mg | DELAYED_RELEASE_CAPSULE | Freq: Two times a day (BID) | ORAL | Status: DC
Start: 2011-03-28 — End: 2011-03-29

## 2011-03-28 NOTE — Progress Notes (Signed)
Subjective:   Patient ID: [name] [name] female   DOB: 1945/07/13 65 y.o.   MRN: [id number]  HPI: [name] [name] is a 65 y.o. woman who presents to clinic today complaining of a sensation that her bladder was falling.    She state that Saturday before the visit she was having intercourse when she felt like "my vagina was like a bag and that when he pulled out it was going to go inside out."  She denies any urinary or bowel incontinence, urinary urgency, pain with sex, fevers, chills, abdominal pain, vaginal bleeding, or vaginal discharge.  She has not felt a mass in her vagina and can not recreate the sensation while straining to have a BM.   She had a hysterectomy in 1984 for fibroids and two vaginal births.  She also states that she has been taking her medication and denies any side effects.  She is a diet controlled diabetic and takes several medications for hypertension as well.    She is due for several health maintenance items including her diabetic foot exam and flu vaccine which we will do today as well.    Past Medical History  Diagnosis Date  . DM type 2 (diabetes mellitus, type 2)   . TIA (transient ischemic attack)     MRI 2007 no changes.  There is question of ischemia versus a demyelinating process.  . Anxiety   . GERD (gastroesophageal reflux disease)   . HTN (hypertension)   . Hypothyroidism   . Depression   . Allergic rhinitis   . Fibromyalgia   . Back pain   . Hemorrhoids   . Migraine headache   . Chest pain     Catheterization 2004 normal coronary arteries  /  nuclear August 2 007, no ischemia  . Joint pain     Pains in multiple joints  . Shortness of breath     March, 2012  . Elevated alkaline phosphatase level   . Dysphagia   . Hiatal hernia   . Abdominal pain, RUQ (right upper quadrant)   . Hypercholesteremia   . Hematochezia   . UTI (lower urinary tract infection)    Current Outpatient Prescriptions  Medication Sig Dispense Refill  . aspirin 81 MG  tablet Take 81 mg by mouth daily.        . Calcium-Vitamin D-Vitamin K 500-500-40 MG-UNT-MCG CHEW Chew 2 tablets by mouth 2 (two) times daily.       . clonazePAM (KLONOPIN) 0.5 MG tablet Take 0.5 mg by mouth 2 (two) times daily.       . cloNIDine (CATAPRES) 0.1 MG tablet Take 0.1 mg by mouth daily.       . diclofenac sodium (VOLTAREN) 1 % GEL Apply 1 application topically daily as needed.        . diltiazem (TIAZAC) 240 MG 24 hr capsule Take 240 mg by mouth daily.        Marland Kitchen docusate sodium (COLACE) 100 MG capsule Take 200 mg by mouth daily.        . enalapril (VASOTEC) 10 MG tablet TAKE ONE TABLET BY MOUTH EVERY DAY  31 tablet  5  . fluticasone (FLONASE) 50 MCG/ACT nasal spray 2 sprays by Nasal route daily.  16 g  11  . hyoscyamine (LEVSIN SL) 0.125 MG SL tablet Place 0.125 mg under the tongue every 4 (four) hours as needed. May use two tablets under tongue if pain not resolved by one.       Marland Kitchen  levothyroxine (SYNTHROID, LEVOTHROID) 88 MCG tablet Take 1 tablet (88 mcg total) by mouth daily.  31 tablet  9  . metFORMIN (GLUMETZA) 500 MG (MOD) 24 hr tablet Take 500 mg by mouth 2 (two) times daily.        . methocarbamol (ROBAXIN) 500 MG tablet Take 500 mg by mouth 2 (two) times daily.        Marland Kitchen omeprazole (PRILOSEC) 20 MG capsule Take 2 capsules (40 mg total) by mouth 2 (two) times daily.  120 capsule  1  . pravastatin (PRAVACHOL) 20 MG tablet Take 1 tablet (20 mg total) by mouth daily.  31 tablet  11  . pregabalin (LYRICA) 150 MG capsule Take 150 mg by mouth 4 (four) times daily.        . RESTASIS 0.05 % ophthalmic emulsion Place 1 drop into both eyes 2 (two) times daily.       . SUMAtriptan (IMITREX) 50 MG tablet Take one tablet by mouth at onset of headache as directed      . venlafaxine (EFFEXOR-XR) 150 MG 24 hr capsule Take 300 mg by mouth daily.       . VENTOLIN HFA 108 (90 BASE) MCG/ACT inhaler Inhale 2 puffs into the lungs 4 (four) times daily as needed.        Family History  Problem Relation  Age of Onset  . Cancer      breast -- grandmother  . Cancer      lung -- uncles (3)  . Leukemia Sister   . Diabetes Father   . Diabetes Mother   . Coronary artery disease Father    History   Social History  . Marital Status: Married    Spouse Name: N/A    Number of Children: N/A  . Years of Education: N/A   Social History Main Topics  . Smoking status: Never Smoker   . Smokeless tobacco: Never Used  . Alcohol Use: No  . Drug Use: No  . Sexually Active: None   Other Topics Concern  . None   Social History Narrative  . None   Review of Systems: Negative except as noted in the HPI.   Objective:  Physical Exam: Filed Vitals:   03/28/11 1427  BP: 126/85  Pulse: 77  Temp: 98 F (36.7 C)  TempSrc: Oral  Height: 5\' 6"  (1.676 m)  Weight: 194 lb 14.4 oz (88.406 kg)   Constitutional: Vital signs reviewed.  Patient is a well-developed and well-nourished woman in no acute distress and cooperative with exam. Alert and oriented x3.  Head: Normocephalic and atraumatic Ear: TM normal bilaterally Mouth: no erythema or exudates, MMM Eyes: PERRL, EOMI, conjunctivae normal, No scleral icterus.  Neck: Supple, Trachea midline normal ROM, No JVD, mass, thyromegaly, or carotid bruit present.  Cardiovascular: RRR, S1 normal, S2 normal, no MRG, pulses symmetric and intact bilaterally Pulmonary/Chest: CTAB, no wheezes, rales, or rhonchi Abdominal: Soft. Non-tender, non-distended, bowel sounds are normal, no masses, organomegaly, or guarding present.  GU: no CVA tenderness, external genitalia normal with no lesions or ulcerations noted.  Speculum exam reveals well rugated vaginal tissue and an intact vaginal cuff from previous hysterectomy.  There is no protrusion with valsalva maneuver.  Musculoskeletal: No joint deformities, erythema, or stiffness, ROM full and no nontender Hematology: no cervical, inginal, or axillary adenopathy.  Neurological: A&O x3, Strenght is normal and symmetric  bilaterally, cranial nerve II-XII are grossly intact, no focal motor deficit, sensory intact to light touch bilaterally.  Skin:  Warm, dry and intact. No rash, cyanosis, or clubbing.  Psychiatric: Normal mood and affect. speech and behavior is normal. Judgment and thought content normal. Cognition and memory are normal.   Assessment & Plan:

## 2011-03-28 NOTE — Telephone Encounter (Signed)
Pt called stating she feels her bladder has fallen. This was noted during intercourse.  Denies pain, dysuria. HX: hysterectomy.  Will see today at 1:45

## 2011-03-28 NOTE — Patient Instructions (Addendum)
Continue all your other medications as prescribed.  Follow up with your PCP in 2-3 months to see how you're doing.

## 2011-03-29 ENCOUNTER — Other Ambulatory Visit: Payer: Self-pay | Admitting: Internal Medicine

## 2011-03-31 ENCOUNTER — Ambulatory Visit: Payer: Medicare Other

## 2011-04-03 DIAGNOSIS — N949 Unspecified condition associated with female genital organs and menstrual cycle: Secondary | ICD-10-CM | POA: Insufficient documentation

## 2011-04-03 NOTE — Assessment & Plan Note (Signed)
She has several risk factors for cystocele or rectocele as well but there was no sign on physical exam.  She has no warning signs including bowel or bladder incontinence or constipation.  She also has no vaginal prolapse.  We will continue to monitor her and suggested a water soluble lubricant for intercourse.

## 2011-04-03 NOTE — Assessment & Plan Note (Signed)
She has no current symptoms on the omeprazole 40 mg BID.  We will monitor closely and trial off the medication in a few months.

## 2011-04-03 NOTE — Assessment & Plan Note (Signed)
Lab Results  Component Value Date   HGBA1C 5.7 03/09/2011   HGBA1C 5.7 10/27/2010   HGBA1C 5.5 07/28/2010   Lab Results  Component Value Date   MICROALBUR 0.75 05/19/2010   LDLCALC 92 11/03/2010   CREATININE 0.60 01/01/2011   Her A1C is well controlled on diet alone.  We will continue to monitor closely.

## 2011-04-05 ENCOUNTER — Ambulatory Visit: Payer: Medicare Other

## 2011-04-05 ENCOUNTER — Encounter: Payer: Medicare Other | Attending: Neurosurgery | Admitting: Neurosurgery

## 2011-04-05 DIAGNOSIS — F329 Major depressive disorder, single episode, unspecified: Secondary | ICD-10-CM | POA: Insufficient documentation

## 2011-04-05 DIAGNOSIS — M719 Bursopathy, unspecified: Secondary | ICD-10-CM | POA: Insufficient documentation

## 2011-04-05 DIAGNOSIS — F3289 Other specified depressive episodes: Secondary | ICD-10-CM

## 2011-04-05 DIAGNOSIS — IMO0001 Reserved for inherently not codable concepts without codable children: Secondary | ICD-10-CM | POA: Insufficient documentation

## 2011-04-05 DIAGNOSIS — F411 Generalized anxiety disorder: Secondary | ICD-10-CM | POA: Insufficient documentation

## 2011-04-05 DIAGNOSIS — M19019 Primary osteoarthritis, unspecified shoulder: Secondary | ICD-10-CM

## 2011-04-05 DIAGNOSIS — M545 Low back pain, unspecified: Secondary | ICD-10-CM | POA: Insufficient documentation

## 2011-04-05 DIAGNOSIS — G8929 Other chronic pain: Secondary | ICD-10-CM | POA: Insufficient documentation

## 2011-04-05 DIAGNOSIS — IMO0002 Reserved for concepts with insufficient information to code with codable children: Secondary | ICD-10-CM

## 2011-04-05 DIAGNOSIS — M67919 Unspecified disorder of synovium and tendon, unspecified shoulder: Secondary | ICD-10-CM | POA: Insufficient documentation

## 2011-04-05 DIAGNOSIS — M1710 Unilateral primary osteoarthritis, unspecified knee: Secondary | ICD-10-CM | POA: Insufficient documentation

## 2011-04-05 LAB — CORTISOL
Cortisol, Plasma: 10.4
Cortisol, Plasma: 19.4
Cortisol, Plasma: 22.3

## 2011-04-05 NOTE — Assessment & Plan Note (Signed)
This is a patient Dr. [name] who is seen for multiple pain complaints. She states her back is much better after physical therapy.  She has got one more appointment this week.  Today on her pain check, she did not rate her pain or her activity level.  She states the pain is worse with walking, bending, standing, and activity.  Rest, heat, ice therapy, medication, TENS unit tend to help.  She walks without assistance.  She can walk about 20 minutes at a time.  She can climb steps and drive. She is on disability.  REVIEW OF SYSTEMS:  Notable for difficulties described above as well as bladder and bowel control issues, weakness, numbness, spasm, depression, anxiety.  No suicidal thoughts or aberrant behaviors at this point. Fever, chills, weight loss, blood sugars fluctuations, GI problems, limb swelling, cough and shortness of breath.  PAST MEDICAL HISTORY:  Unchanged.  SOCIAL HISTORY:  Unchanged.  FAMILY HISTORY:  Unchanged.  PHYSICAL EXAMINATION:  VITAL SIGNS:  Her blood pressure is 138/79, pulse 78, respirations 16, O2 sats 98 on room air. NEUROLOGIC:  Her motor strength and sensation in lower extremities is within normal limits. CONSTITUTIONAL:  She is depressed, she is obese.  She is alert and oriented x3, slow to rise from a seated position, has a little bit of a limp to her gait.  IMPRESSION: 1. Chronic low back pain. 2. Fibromyalgia. 3. Rotator cuff syndrome. 4. Osteoarthritis in the knees. 5. Anxiety and depression.  PLAN:  Just filled her Robaxin 500 mg 1 p.o. b.i.d., 60 with 2 refills today.  She has refills on her other medicines.  Her questions were encouraged and answered.  We will see her back in a month.     [name] [name] L. [name] [name] Electronically Signed    RLW/MedQ D:  04/05/2011 13:45:05  T:  04/05/2011 19:54:08  Job #:  [id number]

## 2011-04-06 ENCOUNTER — Ambulatory Visit (HOSPITAL_COMMUNITY): Payer: Medicare Other

## 2011-04-07 ENCOUNTER — Other Ambulatory Visit: Payer: Self-pay | Admitting: Internal Medicine

## 2011-04-07 DIAGNOSIS — I10 Essential (primary) hypertension: Secondary | ICD-10-CM

## 2011-04-08 LAB — URINALYSIS, ROUTINE W REFLEX MICROSCOPIC
Nitrite: NEGATIVE
Protein, ur: NEGATIVE
Specific Gravity, Urine: <1.005 — ABNORMAL LOW
Urobilinogen, UA: 0.2

## 2011-04-08 LAB — URINE MICROSCOPIC-ADD ON

## 2011-04-13 LAB — GLUCOSE, CAPILLARY: Glucose-Capillary: 90

## 2011-04-15 ENCOUNTER — Ambulatory Visit (HOSPITAL_COMMUNITY)
Admission: RE | Admit: 2011-04-15 | Discharge: 2011-04-15 | Disposition: A | Discharge status: Home / Self Care | Payer: Medicare Other | Source: Ambulatory Visit | Attending: Internal Medicine | Admitting: Internal Medicine

## 2011-04-15 DIAGNOSIS — Z1231 Encounter for screening mammogram for malignant neoplasm of breast: Secondary | ICD-10-CM | POA: Insufficient documentation

## 2011-05-16 ENCOUNTER — Encounter: Payer: Medicare Other | Attending: Physical Medicine & Rehabilitation | Admitting: Physical Medicine & Rehabilitation

## 2011-05-16 DIAGNOSIS — M7530 Calcific tendinitis of unspecified shoulder: Secondary | ICD-10-CM

## 2011-05-16 DIAGNOSIS — M19019 Primary osteoarthritis, unspecified shoulder: Secondary | ICD-10-CM

## 2011-05-16 DIAGNOSIS — M719 Bursopathy, unspecified: Secondary | ICD-10-CM | POA: Insufficient documentation

## 2011-05-16 DIAGNOSIS — G8929 Other chronic pain: Secondary | ICD-10-CM | POA: Insufficient documentation

## 2011-05-16 DIAGNOSIS — M25559 Pain in unspecified hip: Secondary | ICD-10-CM | POA: Insufficient documentation

## 2011-05-16 DIAGNOSIS — IMO0001 Reserved for inherently not codable concepts without codable children: Secondary | ICD-10-CM

## 2011-05-16 DIAGNOSIS — M25519 Pain in unspecified shoulder: Secondary | ICD-10-CM | POA: Insufficient documentation

## 2011-05-16 DIAGNOSIS — R209 Unspecified disturbances of skin sensation: Secondary | ICD-10-CM | POA: Insufficient documentation

## 2011-05-16 DIAGNOSIS — M545 Low back pain, unspecified: Secondary | ICD-10-CM | POA: Insufficient documentation

## 2011-05-16 DIAGNOSIS — K5900 Constipation, unspecified: Secondary | ICD-10-CM | POA: Insufficient documentation

## 2011-05-16 DIAGNOSIS — F341 Dysthymic disorder: Secondary | ICD-10-CM | POA: Insufficient documentation

## 2011-05-16 DIAGNOSIS — IMO0002 Reserved for concepts with insufficient information to code with codable children: Secondary | ICD-10-CM | POA: Insufficient documentation

## 2011-05-16 DIAGNOSIS — M67919 Unspecified disorder of synovium and tendon, unspecified shoulder: Secondary | ICD-10-CM | POA: Insufficient documentation

## 2011-05-16 NOTE — Assessment & Plan Note (Signed)
HISTORY:  [name] [name] is back regarding her multiple pain complaints.  She reports increased pain in the right hip as well as right shoulder, numbness in the fingers left more than right, particularly at the fingers 3, 4, and 5.  She does report that she has been holding her grandchild quite a bit, does report constipation as well.  She is using naproxen as prescribed as well as her Robaxin and Lyrica.  Mood has generally been stable.  Pain today is 6/10.  Sleep is fair.  REVIEW OF SYSTEMS:  Notable for the above.  Full 12-point review is in the written health and history section of the chart.  She is having constipation despite using MiraLax regularly.  SOCIAL HISTORY:  Unchanged.  PHYSICAL EXAMINATION:  VITAL SIGNS:  Blood pressure 138/74, pulse 82, respiratory rate 16, she is satting 93% on room air. GENERAL:  The patient is patient is pleasant and alert.  She has pain in the right shoulder with rotator cuff impingement maneuvers and in the subacromial bursa.  She has positive Tinel signs at either ulnar groove. She had some pain along the anterior and superior iliac spine and into the proximal thigh.  Seem to be bit worse with hip manipulation and somewhat with hip flexion, although lesser so than with rotation.  She walks fairly well.  Cognitively, she is alert and appropriate.  She has minimal limp for antalgia on the right with gait.  ASSESSMENT: 1. Fibromyalgia syndrome. 2. Chronic low back pain with history of radiculopathy. 3. Right rotator cuff syndrome. 4. Anxiety with depression. 5. Transient ulnar nerve symptoms in either hand left more than right. 6. Right hip pain which appears to be musculotendinous in origin, it     may be a constipation component as well.  PLAN: 1. We discussed modifications of techniques regarding her     granddaughter.  She will need to find other places to keep her     rather than holding her through larger segments of the day.  May be     she  can use a backpack or assistive devices as such holder.  We     discussed elbow pad for either elbow particularly at nighttime with     sleep. 2. After informed consent, we injected the right shoulder via lateral     approach of 40 mg Kenalog and 3 mL 1% lidocaine.  The patient     tolerated well. 3. Recommended stretching and ice to the right hip and again activity     modification. 4. Refilled naproxen today.  Asked her to be careful regarding GI side     effects.  I think, however most of her nausea is related to     constipation at this point.  We did discuss bowel regimen     modification including doubling her MiraLAX, adding a probiotic,     increasing liquids and fiber. 5. My nurse practitioner will see her back in a month.     Ranelle Oyster, M.D. Electronically Signed    ZTS/MedQ D:  05/16/2011 11:24:50  T:  05/16/2011 12:10:04  Job #:  [id number]

## 2011-05-21 ENCOUNTER — Other Ambulatory Visit: Payer: Self-pay | Admitting: Internal Medicine

## 2011-05-23 ENCOUNTER — Other Ambulatory Visit: Payer: Self-pay | Admitting: *Deleted

## 2011-05-23 MED ORDER — DILTIAZEM HCL ER BEADS 240 MG PO CP24: 240.0000 mg | ORAL_CAPSULE | Freq: Every day | ORAL | Status: DC

## 2011-05-23 NOTE — Telephone Encounter (Signed)
Please note that this refill request did not specify which medication needs to be refilled.

## 2011-06-11 ENCOUNTER — Other Ambulatory Visit: Payer: Self-pay | Admitting: Internal Medicine

## 2011-06-15 ENCOUNTER — Telehealth: Payer: Self-pay | Admitting: *Deleted

## 2011-06-15 ENCOUNTER — Encounter: Payer: Medicare Other | Attending: Neurosurgery | Admitting: Neurosurgery

## 2011-06-15 DIAGNOSIS — F411 Generalized anxiety disorder: Secondary | ICD-10-CM | POA: Insufficient documentation

## 2011-06-15 DIAGNOSIS — IMO0001 Reserved for inherently not codable concepts without codable children: Secondary | ICD-10-CM

## 2011-06-15 DIAGNOSIS — F3289 Other specified depressive episodes: Secondary | ICD-10-CM | POA: Insufficient documentation

## 2011-06-15 DIAGNOSIS — R51 Headache: Secondary | ICD-10-CM | POA: Insufficient documentation

## 2011-06-15 DIAGNOSIS — F329 Major depressive disorder, single episode, unspecified: Secondary | ICD-10-CM | POA: Insufficient documentation

## 2011-06-15 DIAGNOSIS — M25529 Pain in unspecified elbow: Secondary | ICD-10-CM

## 2011-06-15 DIAGNOSIS — M545 Low back pain, unspecified: Secondary | ICD-10-CM | POA: Insufficient documentation

## 2011-06-15 DIAGNOSIS — Z79899 Other long term (current) drug therapy: Secondary | ICD-10-CM | POA: Insufficient documentation

## 2011-06-15 DIAGNOSIS — F341 Dysthymic disorder: Secondary | ICD-10-CM

## 2011-06-15 DIAGNOSIS — M719 Bursopathy, unspecified: Secondary | ICD-10-CM | POA: Insufficient documentation

## 2011-06-15 DIAGNOSIS — M67919 Unspecified disorder of synovium and tendon, unspecified shoulder: Secondary | ICD-10-CM | POA: Insufficient documentation

## 2011-06-15 NOTE — Telephone Encounter (Addendum)
Pt calls and leaves message that she is having swelling and needs a fluid pill, after speaking w/ pt it is the R leg from calf to hip, swells and is painful, must elevate for relief, appt made for 12/10 at 0915

## 2011-06-15 NOTE — Assessment & Plan Note (Signed)
This patient of Dr. Riley Kill seen for multiple pain complaints throughout her body.  She rates her average pain at 5-8.  It is a burning, dull, aching and constant pain.  She does tell me when she takes her Robaxin at night being in conjunction with her Klonopin, she gets headaches.  We are going to see about changing that today.  General activity level is 329.  The pain is worse during the day.  Sleep patterns are fair. Walking, bending, standing and activity aggravate.  Rest and medication injections help.  She will use a cane for ambulation.  She does climb steps and drive.  Functionally, she is unemployed.  REVIEW OF SYSTEMS:  Notable for difficulties described above as well as some bowel and bladder control issues, weakness, numbness, trouble walking, anxiety and no suicidal thoughts or aberrant behaviors.  PAST MEDICAL HISTORY, SOCIAL HISTORY AND FAMILY HISTORY:  Unchanged.  PHYSICAL EXAMINATION:  VITAL SIGNS:  Blood pressure 147/78, pulse 80, respirations 16 and O2 sats 96 on room air. EXTREMITIES:  Motor strength and sensation are intact. NEUROLOGIC:  Constitutionally, she is within normal limits.  She appears somewhat depressed.  Gait is normal.  ASSESSMENT: 1. Fibromyalgia. 2. Chronic low back pain. 3. Rotator cuff syndrome. 4. Anxiety and depression.  PLAN:  Prescribe Flexeril 10 mg 1 p.o. b.i.d., 60 with 3 refills.  We are going to discontinue the Robaxin.  We will see her back in a month. Her questions were encouraged and answered.     Mervin Ramires L. Blima Dessert Electronically Signed    RLW/MedQ D:  06/15/2011 10:33:37  T:  06/15/2011 22:09:51  Job #:  [id number]

## 2011-06-16 ENCOUNTER — Ambulatory Visit (HOSPITAL_COMMUNITY)
Admission: RE | Admit: 2011-06-16 | Discharge: 2011-06-16 | Disposition: A | Discharge status: Home / Self Care | Payer: Medicare Other | Source: Ambulatory Visit | Attending: Internal Medicine | Admitting: Internal Medicine

## 2011-06-16 ENCOUNTER — Ambulatory Visit (INDEPENDENT_AMBULATORY_CARE_PROVIDER_SITE_OTHER): Payer: Medicare Other | Admitting: Internal Medicine

## 2011-06-16 ENCOUNTER — Encounter: Payer: Self-pay | Admitting: Internal Medicine

## 2011-06-16 VITALS — BP 135/82 | HR 87 | Temp 98.1°F | Ht 66.0 in | Wt 195.4 lb

## 2011-06-16 DIAGNOSIS — M79609 Pain in unspecified limb: Secondary | ICD-10-CM

## 2011-06-16 DIAGNOSIS — E039 Hypothyroidism, unspecified: Secondary | ICD-10-CM

## 2011-06-16 DIAGNOSIS — E119 Type 2 diabetes mellitus without complications: Secondary | ICD-10-CM

## 2011-06-16 DIAGNOSIS — M7989 Other specified soft tissue disorders: Secondary | ICD-10-CM | POA: Insufficient documentation

## 2011-06-16 LAB — GLUCOSE, CAPILLARY: Glucose-Capillary: 109 mg/dL — ABNORMAL HIGH (ref 70–99)

## 2011-06-16 LAB — TSH: TSH: 3.033 u[IU]/mL (ref 0.350–4.500)

## 2011-06-16 LAB — POCT GLYCOSYLATED HEMOGLOBIN (HGB A1C): Hemoglobin A1C: 6.1

## 2011-06-16 NOTE — Patient Instructions (Signed)
How to do Kegel exercises It takes diligence to identify your pelvic floor muscles and learn how to contract and relax them. Here are some pointers:  Find the right muscles. To identify your pelvic floor muscles, stop urination in midstream. If you succeed, you've got the right muscles.  Perfect your technique. Once you've identified your pelvic floor muscles, empty your bladder and lie on your back. Tighten your pelvic floor muscles, hold the contraction for five seconds, and then relax for five seconds. Try it four or five times in a row. Work up to keeping the muscles contracted for 10 seconds at a time, relaxing for 10 seconds between contractions.  Maintain your focus. For best results, focus on tightening only your pelvic floor muscles. Be careful not to flex the muscles in your abdomen, thighs or buttocks. Avoid holding your breath. Instead, breathe freely during the exercises.  Repeat 3 times a day. Aim for at least three sets of 10 repetitions a day.  1. Increase the Miralax to twice daily to help soften your bowel movements.  2. Continue all your medications as prescribed.  3.  Use the compression stockings if you are going to be on your feet a lot to help limit the swelling.  4. Stop in the lab to have your blood drawn.  I will call you if we need to follow up on the result.  5.  Follow up with your PCP in about 3 months.

## 2011-06-16 NOTE — Progress Notes (Signed)
Subjective:   Patient ID: [name] [name] female   DOB: 09/10/1945 65 y.o.   MRN: [id number]  HPI: [name] [name] How is a 65 y.o. woman who presents to clinic today complaining of right leg swelling for the last 2 days.  She states that the right leg was swollen to the point where the skin was shiny.  She denies redness, fever, chills, rash, sedentary lifestyle, or history of clots.  She states that she was up on her feet a lot over the last few days.  She also denies SOB, DOE, or chest pain.  She is a non-smoker and does not take oral birth control pills.  She states that she has had the leg swell like this 4-5 times over the last few months.  Today she put a compression stocking on and the swelling is much less.  She states she is otherwise taking her medications as prescribed. She denies any side effects from her medication or hypoglycemia events.  She does still struggle with constipation.  She has been taking her miralax but still has only BM every other day or so.  She denies blood in her stool, nausea, vomiting, or abdominal pain.     Past Medical History  Diagnosis Date  . DM type 2 (diabetes mellitus, type 2)   . TIA (transient ischemic attack)     MRI 2007 no changes.  There is question of ischemia versus a demyelinating process.  . Anxiety   . GERD (gastroesophageal reflux disease)   . HTN (hypertension)   . Hypothyroidism   . Depression   . Allergic rhinitis   . Fibromyalgia   . Back pain   . Hemorrhoids   . Migraine headache   . Chest pain     Catheterization 2004 normal coronary arteries  /  nuclear August 2 007, no ischemia  . Joint pain     Pains in multiple joints  . Shortness of breath     March, 2012  . Elevated alkaline phosphatase level   . Dysphagia   . Hiatal hernia   . Abdominal pain, RUQ (right upper quadrant)   . Hypercholesteremia   . Hematochezia   . UTI (lower urinary tract infection)    Current Outpatient Prescriptions  Medication Sig Dispense Refill   . aspirin 81 MG tablet Take 81 mg by mouth daily.        Marland Kitchen buPROPion (WELLBUTRIN XL) 150 MG 24 hr tablet Take 1 tablet by mouth Daily.      . Calcium-Vitamin D-Vitamin K 500-500-40 MG-UNT-MCG CHEW Chew 2 tablets by mouth 2 (two) times daily.       . clonazePAM (KLONOPIN) 0.5 MG tablet Take 0.5 mg by mouth 2 (two) times daily.       . cloNIDine (CATAPRES) 0.1 MG tablet Take 0.1 mg by mouth daily.       . diclofenac sodium (VOLTAREN) 1 % GEL Apply 1 application topically daily as needed.        . diltiazem (TIAZAC) 240 MG 24 hr capsule Take 1 capsule (240 mg total) by mouth daily.  90 capsule  3  . docusate sodium (COLACE) 100 MG capsule Take 200 mg by mouth daily.        . enalapril (VASOTEC) 10 MG tablet Take 1 tablet (10 mg total) by mouth daily.  90 tablet  3  . fluticasone (FLONASE) 50 MCG/ACT nasal spray 2 sprays by Nasal route daily.  16 g  11  . hyoscyamine (LEVSIN SL)  0.125 MG SL tablet Place 0.125 mg under the tongue every 4 (four) hours as needed. May use two tablets under tongue if pain not resolved by one.       . levothyroxine (SYNTHROID, LEVOTHROID) 88 MCG tablet Take 1 tablet (88 mcg total) by mouth daily.  31 tablet  9  . metFORMIN (GLUMETZA) 500 MG (MOD) 24 hr tablet Take 500 mg by mouth 2 (two) times daily.        . methocarbamol (ROBAXIN) 500 MG tablet Take 500 mg by mouth 2 (two) times daily.        . naproxen (NAPROSYN) 500 MG tablet Take 500 mg by mouth 2 (two) times daily with a meal.        . omeprazole (PRILOSEC) 40 MG capsule TAKE 1 CAPSULE BY MOUTH TWICE DAILY  180 capsule  1  . oxyCODONE-acetaminophen (TYLOX) 5-500 MG per capsule Take 1 tablet by mouth Every 6 hours while awake.      . pravastatin (PRAVACHOL) 20 MG tablet Take 1 tablet (20 mg total) by mouth daily.  31 tablet  11  . pregabalin (LYRICA) 150 MG capsule Take 150 mg by mouth 4 (four) times daily.        . RESTASIS 0.05 % ophthalmic emulsion Place 1 drop into both eyes 2 (two) times daily.       .  SUMAtriptan (IMITREX) 50 MG tablet Take one tablet by mouth at onset of headache as directed      . venlafaxine (EFFEXOR-XR) 150 MG 24 hr capsule Take 1 tablet by mouth 2 (two) times daily.      . VENTOLIN HFA 108 (90 BASE) MCG/ACT inhaler INHALE 2 PUFFS BY MOUTH FOUR TIMES DAILY AS NEEDED  1 Inhaler  3   Family History  Problem Relation Age of Onset  . Cancer      breast -- grandmother  . Cancer      lung -- uncles (3)  . Leukemia Sister   . Diabetes Father   . Diabetes Mother   . Coronary artery disease Father    History   Social History  . Marital Status: Married    Spouse Name: N/A    Number of Children: N/A  . Years of Education: N/A   Social History Main Topics  . Smoking status: Never Smoker   . Smokeless tobacco: Never Used  . Alcohol Use: No  . Drug Use: No  . Sexually Active: None   Other Topics Concern  . None   Social History Narrative  . None   Review of Systems: Constitutional: Denies fever, chills, diaphoresis, appetite change and fatigue.  HEENT: Denies photophobia, eye pain, redness, hearing loss, ear pain, congestion, sore throat, rhinorrhea, sneezing, mouth sores, trouble swallowing, neck pain, neck stiffness and tinnitus.   Respiratory: Denies SOB, DOE, cough, chest tightness,  and wheezing.   Cardiovascular: Denies chest pain, palpitations and leg swelling.  Gastrointestinal: Denies nausea, vomiting, abdominal pain, diarrhea, constipation, blood in stool and abdominal distention.  Genitourinary: Denies dysuria, urgency, frequency, hematuria, flank pain and difficulty urinating.  Extremities:  Positive for right leg swelling.  Denies myalgias, back pain, joint swelling, arthralgias and gait problem.  Skin: Denies pallor, rash and wound.  Neurological: Denies dizziness, seizures, syncope, weakness, light-headedness, numbness and headaches.  Hematological: Denies adenopathy. Easy bruising, personal or family bleeding history  Psychiatric/Behavioral:  Denies suicidal ideation, mood changes, confusion, nervousness, sleep disturbance and agitation  Objective:  Physical Exam: Filed Vitals:   06/16/11 1319  BP: 135/82  Pulse: 87  Temp: 98.1 F (36.7 C)  TempSrc: Oral  Height: 5\' 6"  (1.676 m)  Weight: 195 lb 6.4 oz (88.633 kg)   Constitutional: Vital signs reviewed.  Patient is a well-developed, well-nourished, and mildly anxious woman in no acute distress and cooperative with exam. Alert and oriented x3.  Head: Normocephalic and atraumatic Ear: TM normal bilaterally Mouth: no erythema or exudates, MMM Eyes: PERRL, EOMI, conjunctivae normal, No scleral icterus.  Neck: Supple, Trachea midline normal ROM, No JVD, mass, thyromegaly, or carotid bruit present.  Cardiovascular: RRR, S1 normal, S2 normal, no MRG, pulses symmetric and intact bilaterally Pulmonary/Chest: CTAB, no wheezes, rales, or rhonchi Abdominal: Soft. Non-tender, non-distended, bowel sounds are normal, no masses, organomegaly, or guarding present.  GU: no CVA tenderness Extremities:  Compression stocking in place on the right, trace edema in the bilateral legs.  No pain to palpation of the posterior calf and no palpable superficial veins.  No rash noted. ROM full and no nontender Neurological: A&O x3, Strenght is normal and symmetric bilaterally, cranial nerve II-XII are grossly intact, no focal motor deficit, sensory intact to light touch bilaterally.  Skin: Warm, dry and intact. No rash, cyanosis, or clubbing.  Psychiatric: mildly anxious mood and affect. speech and behavior is normal. Judgment and thought content normal. Cognition and memory are normal.   Assessment & Plan:

## 2011-06-16 NOTE — Telephone Encounter (Signed)
With unilateral leg pain and swelling, she should be seen and evaluated today.

## 2011-06-16 NOTE — Telephone Encounter (Signed)
Agree with plan 

## 2011-06-16 NOTE — Progress Notes (Signed)
Right lower extremity venous duplex completed at 15:15.  Preliminary report is negative for DVT, SVT, or a Baker's cyst. Smiley Houseman 06/16/2011, 3:41 PM

## 2011-06-16 NOTE — Telephone Encounter (Signed)
After speaking to dr Meredith Pel i have recalled the pt and ask her to come in per dr Meredith Pel at this time she agrees, appt 1315 dr Gilford Rile

## 2011-06-17 ENCOUNTER — Ambulatory Visit: Payer: Medicare Other | Admitting: Internal Medicine

## 2011-06-20 ENCOUNTER — Ambulatory Visit: Payer: Medicare Other | Admitting: Internal Medicine

## 2011-06-21 NOTE — Assessment & Plan Note (Signed)
Her leg swelling is of course concerning for DVT even though she has very little risk factor.  After discussing the patient with Dr. Meredith Pel it was decided to send her for a venous doppler study urgently.  Results of the Venous doppler study was no evidence of deep vein thrombosis or bakers cyst.  There was also no evidence of reflux in the leg.  Her swelling was likely secondary to venous stasis with the increased activity.  She will need continued monitoring and was advised that she could use the OTC compression stocking to help relieve the swelling.

## 2011-06-21 NOTE — Assessment & Plan Note (Signed)
TSH (uIU/mL)  Date Value  06/16/2011 3.033   11/03/2010 5.035*  12/30/2009 1.555    Her TSH is back in the normal range.  We will continue to monitor on a yearly basis to ensure she is on an adequate dose.

## 2011-06-21 NOTE — Assessment & Plan Note (Signed)
Lab Results  Component Value Date   HGBA1C 6.1 06/16/2011   HGBA1C 5.7 03/09/2011   HGBA1C 5.7 10/27/2010   Lab Results  Component Value Date   MICROALBUR 0.75 05/19/2010   LDLCALC 92 11/03/2010   CREATININE 0.60 01/01/2011   Her A1C is mildly increased from the last check but still well controlled.  She was counseled to continue watching her diet and staying active to continue controlling her A1C well.

## 2011-07-08 ENCOUNTER — Telehealth: Payer: Self-pay | Admitting: *Deleted

## 2011-07-08 ENCOUNTER — Other Ambulatory Visit: Payer: Self-pay | Admitting: Internal Medicine

## 2011-07-08 DIAGNOSIS — J40 Bronchitis, not specified as acute or chronic: Secondary | ICD-10-CM

## 2011-07-08 MED ORDER — AZITHROMYCIN 250 MG PO TABS: ORAL_TABLET | ORAL | Status: AC

## 2011-07-08 NOTE — Telephone Encounter (Signed)
Call from pt said that she has had a cough since last Friday.  York Spaniel it was a Bronchitis type cough.  Not coughing up anything.  No fevers or chills.  York Spaniel that her daughter had the same thing and was given a Z-pak .  Would like to have 1 called in for her.  Cough is keeping her awake especially if she turns. Uses the [facility] at [phone].  Angelina Ok, RN 07/08/2011 12:01 PM.

## 2011-07-08 NOTE — Telephone Encounter (Signed)
Done sent to [facility]

## 2011-07-08 NOTE — Telephone Encounter (Signed)
Pt was called and informed that the prescription for the Z -pak had been called in.  Angelina Ok, RN 12:26 PM

## 2011-07-13 ENCOUNTER — Encounter: Payer: Medicare Other | Attending: Neurosurgery | Admitting: Neurosurgery

## 2011-07-13 DIAGNOSIS — F411 Generalized anxiety disorder: Secondary | ICD-10-CM | POA: Insufficient documentation

## 2011-07-13 DIAGNOSIS — F329 Major depressive disorder, single episode, unspecified: Secondary | ICD-10-CM | POA: Insufficient documentation

## 2011-07-13 DIAGNOSIS — M67919 Unspecified disorder of synovium and tendon, unspecified shoulder: Secondary | ICD-10-CM | POA: Insufficient documentation

## 2011-07-13 DIAGNOSIS — F341 Dysthymic disorder: Secondary | ICD-10-CM

## 2011-07-13 DIAGNOSIS — M545 Low back pain, unspecified: Secondary | ICD-10-CM | POA: Insufficient documentation

## 2011-07-13 DIAGNOSIS — F3289 Other specified depressive episodes: Secondary | ICD-10-CM | POA: Insufficient documentation

## 2011-07-13 DIAGNOSIS — IMO0001 Reserved for inherently not codable concepts without codable children: Secondary | ICD-10-CM | POA: Insufficient documentation

## 2011-07-13 DIAGNOSIS — M719 Bursopathy, unspecified: Secondary | ICD-10-CM | POA: Insufficient documentation

## 2011-07-13 NOTE — Assessment & Plan Note (Signed)
This is a patient of Dr. [name] seen for multiple pain complaints. She rates her average pain at 6-7.  It is a dull aching pain.  General activity level is 5 to an 8.  Pain is worse during the daytime and evening.  Sleep patterns are fair.  Standing and activity aggravate. Bed rest tends to help.  She states her medication she feels like does not help.  She is on nonnarcotic management only now.  She walks with a cane.  She climbs steps and drives.  She can walk about 20 minutes at a time.  Functionally, she is on disability.  REVIEW OF SYSTEMS:  Notable for difficulties as described above as well as some weight gain, high blood sugars, nausea, limb swelling, coughing, paresthesias, depression, and anxiety.  No suicidal thoughts.  PAST MEDICAL HISTORY/SOCIAL HISTORY/FAMILY HISTORY:  Unchanged.  PHYSICAL EXAMINATION:  VITAL SIGNS:  Blood pressure 153/80, pulse 93, respirations 14, and O2 sats 95 on room air. NEUROLOGIC:  Motor strength and sensation are intact. CONSTITUTIONAL:  She is within normal limits.  She is alert and oriented x3.  She has a slight limp.  ASSESSMENT: 1. Fibromyalgia. 2. Chronic low back pain. 3. Rotator cuff syndrome. 4. Anxiety and depression.  PLAN:  No prescriptions were given.  She will continue on her current dosing of current medicines.  She did ask about narcotic pain medicines. Again, I told her that was not an option for her.  She and her husband stated understanding to follow up with Dr. Riley Kill in a month.     Olander Friedl L. Blima Dessert Electronically Signed    RLW/MedQ D:  07/13/2011 10:46:52  T:  07/13/2011 14:44:42  Job #:  [id number]

## 2011-07-27 ENCOUNTER — Ambulatory Visit (INDEPENDENT_AMBULATORY_CARE_PROVIDER_SITE_OTHER): Payer: Medicare Other | Admitting: Internal Medicine

## 2011-07-27 ENCOUNTER — Encounter: Payer: Self-pay | Admitting: Internal Medicine

## 2011-07-27 DIAGNOSIS — I10 Essential (primary) hypertension: Secondary | ICD-10-CM

## 2011-07-27 DIAGNOSIS — R32 Unspecified urinary incontinence: Secondary | ICD-10-CM | POA: Insufficient documentation

## 2011-07-27 DIAGNOSIS — E119 Type 2 diabetes mellitus without complications: Secondary | ICD-10-CM

## 2011-07-27 DIAGNOSIS — E7800 Pure hypercholesterolemia, unspecified: Secondary | ICD-10-CM

## 2011-07-27 LAB — COMPLETE METABOLIC PANEL WITH GFR
ALT: 11 U/L (ref 0–35)
AST: 15 U/L (ref 0–37)
Alkaline Phosphatase: 100 U/L (ref 39–117)
GFR, Est Non African American: 82 mL/min
Glucose, Bld: 95 mg/dL (ref 70–99)
Potassium: 4.6 mEq/L (ref 3.5–5.3)
Sodium: 140 mEq/L (ref 135–145)
Total Bilirubin: 0.3 mg/dL (ref 0.3–1.2)
Total Protein: 6.7 g/dL (ref 6.0–8.3)

## 2011-07-27 MED ORDER — ALBUTEROL SULFATE HFA 108 (90 BASE) MCG/ACT IN AERS: 2.0000 | INHALATION_SPRAY | Freq: Four times a day (QID) | RESPIRATORY_TRACT | Status: DC | PRN

## 2011-07-27 NOTE — Assessment & Plan Note (Signed)
Lab Results  Component Value Date   HGBA1C 6.1 06/16/2011    Assessment: Diabetes control: controlled Progress toward goals: at goal Barriers to meeting goals: no barriers identified  Plan: Diabetes treatment: continue current medications Refer to: none Instruction/counseling given: reminded to get eye exam

## 2011-07-27 NOTE — Assessment & Plan Note (Signed)
Lipids:    Component Value Date/Time   CHOL 190 11/03/2010 0947   TRIG 153* 11/03/2010 0947   HDL 67 11/03/2010 0947   LDLCALC 92 11/03/2010 0947   VLDL 31 11/03/2010 0947   CHOLHDL 2.8 11/03/2010 0947   Assessment: Patient is doing well on current dose of pravastatin.  Plan: Continue pravastatin at current dose.  Will plan to repeat a fasting lipid panel at next visit.

## 2011-07-27 NOTE — Patient Instructions (Signed)
Continue current medications. 

## 2011-07-27 NOTE — Progress Notes (Signed)
  Subjective:    Patient ID: [name] [name], female    DOB: January 09, 1946, 66 y.o.   MRN: [id number]  HPI Patient presents for followup of her diabetes mellitus, hypertension, and other chronic medical problems.  Her main complaint today is urinary dribbling, urinary incontinence with coughing, and some frequency which has occurred for more than a month.  She reports no dysuria and no sensation of incomplete bladder emptying.  She has occasional left shoulder pain with some tingling in her hand which seems positional.  She reports that she is compliant with her medications.   Review of Systems  Constitutional: Positive for diaphoresis (One episode of sweating at night.). Negative for fever and chills.  Respiratory: Positive for cough (Some persisting nonproductive cough after episode of bronchitis about 1 month ago).   Gastrointestinal: Negative for vomiting and abdominal pain.  Genitourinary: Negative for dysuria.       Objective:   Physical Exam  Constitutional: No distress.  Cardiovascular: Normal rate and regular rhythm.  Exam reveals no gallop and no friction rub.   No murmur heard. Pulmonary/Chest: Effort normal and breath sounds normal. No respiratory distress. She has no wheezes. She has no rales.  Abdominal: Soft. Bowel sounds are normal. She exhibits no distension. There is no tenderness. There is no guarding.  Musculoskeletal: She exhibits no edema.          Assessment & Plan:

## 2011-07-27 NOTE — Assessment & Plan Note (Signed)
Lab Results  Component Value Date   NA 138 01/01/2011   K 3.7 01/01/2011   CL 101 01/01/2011   CO2 29 01/01/2011   BUN 8 01/01/2011   CREATININE 0.60 01/01/2011   CREATININE 0.79 11/03/2010    BP Readings from Last 3 Encounters:  07/27/11 128/74  06/16/11 135/82  03/28/11 126/85    Assessment: Hypertension control:  controlled  Progress toward goals:  at goal Barriers to meeting goals:  no barriers identified  Plan: Hypertension treatment:  continue current medications

## 2011-07-27 NOTE — Assessment & Plan Note (Signed)
Patient reports urinary incontinence in small amounts, characterized by dribbling and incontinence with cough.  She has been performing Kegel exercises for more than a month and reports no improvement.  Her urine dipstick today showed trace leukocytes but was otherwise unremarkable.  The plan is to culture her urine and if positive will treat for urinary tract infection.  If the culture is negative, will refer to urology for evaluation given her ongoing symptoms despite Kegel exercises.

## 2011-07-28 ENCOUNTER — Ambulatory Visit (INDEPENDENT_AMBULATORY_CARE_PROVIDER_SITE_OTHER): Payer: Medicare Other | Admitting: Ophthalmology

## 2011-07-29 LAB — URINE CULTURE

## 2011-08-05 ENCOUNTER — Other Ambulatory Visit: Payer: Self-pay | Admitting: Internal Medicine

## 2011-08-11 ENCOUNTER — Encounter: Payer: Medicare Other | Attending: Neurosurgery | Admitting: Neurosurgery

## 2011-08-11 DIAGNOSIS — M545 Low back pain, unspecified: Secondary | ICD-10-CM | POA: Insufficient documentation

## 2011-08-11 DIAGNOSIS — IMO0001 Reserved for inherently not codable concepts without codable children: Secondary | ICD-10-CM | POA: Insufficient documentation

## 2011-08-11 DIAGNOSIS — G894 Chronic pain syndrome: Secondary | ICD-10-CM

## 2011-08-11 DIAGNOSIS — G8929 Other chronic pain: Secondary | ICD-10-CM | POA: Insufficient documentation

## 2011-08-11 DIAGNOSIS — F411 Generalized anxiety disorder: Secondary | ICD-10-CM | POA: Insufficient documentation

## 2011-08-11 DIAGNOSIS — F3289 Other specified depressive episodes: Secondary | ICD-10-CM | POA: Insufficient documentation

## 2011-08-11 DIAGNOSIS — M67919 Unspecified disorder of synovium and tendon, unspecified shoulder: Secondary | ICD-10-CM | POA: Insufficient documentation

## 2011-08-11 DIAGNOSIS — M719 Bursopathy, unspecified: Secondary | ICD-10-CM | POA: Insufficient documentation

## 2011-08-11 DIAGNOSIS — F329 Major depressive disorder, single episode, unspecified: Secondary | ICD-10-CM | POA: Insufficient documentation

## 2011-08-11 NOTE — Assessment & Plan Note (Signed)
This is a patient of Dr. Riley Kill seen for multiple pain complaints as well as neck pain and the upper left extremity pain.  She rates her pain at a 5-6, constant, aching pain.  General activity level is 10.  Pain is same 24 hours a day.  Sleep patterns are fair.  Pain is worse with bending, sitting and activity.  Rest, heat and medication helps.  She uses a cane for ambulation.  She does drive and climb steps.  She can walk about 50 minutes at a time.  Functionally, she is independent.  REVIEW OF SYSTEMS:  Notable for difficulties described above as well as some bladder and bowel control issues paresthesias, trouble with walking, dizziness, depression, anxiety.  No suicidal thoughts or aberrant behaviors.  No narcotics prescribed.  PAST MEDICAL HISTORY:  Unchanged.  SOCIAL HISTORY:  Unchanged.  FAMILY HISTORY:  Unchanged.  PHYSICAL EXAMINATION:  Her blood pressure is 131/66, pulse 80, respirations 16, O2 sats 97 on room air.  Motor strength sensation are somewhat diminished throughout, question good her effort is with the strength.  Constitutionally, she is within normal limits.  She is alert and oriented x3.  She has normal gait.  ASSESSMENT: 1. History of fibromyalgia. 2. Chronic low back pain. 3. Rotator cuff syndrome. 4. Anxiety and depression.  PLAN: 1. She will continue her current medication regimen. 2. She is to get a copy of her lumbar MRI from last year, to take to     Dr. Hilda Lias for review and she may get an MRI of her neck due to     her upper extremity pain.  Questions were encouraged and answered.     She will follow up here in a month.     [name] [name] L. Blima Dessert Electronically Signed    RLW/MedQ D:  08/11/2011 10:35:01  T:  08/11/2011 11:06:48  Job #:  [id number]

## 2011-08-12 ENCOUNTER — Ambulatory Visit: Payer: Medicare Other | Admitting: Physical Medicine & Rehabilitation

## 2011-08-17 ENCOUNTER — Ambulatory Visit (INDEPENDENT_AMBULATORY_CARE_PROVIDER_SITE_OTHER): Payer: Medicare Other | Admitting: Ophthalmology

## 2011-08-17 ENCOUNTER — Telehealth: Payer: Self-pay | Admitting: *Deleted

## 2011-08-17 NOTE — Telephone Encounter (Signed)
Pt calls c/o cough x 1 month, denies fevers, nonproductive, no other complaints. appt given for 2/7 @ 0845 per chilonb.

## 2011-08-17 NOTE — Telephone Encounter (Signed)
Agree with plan 

## 2011-08-18 ENCOUNTER — Other Ambulatory Visit (HOSPITAL_COMMUNITY): Payer: Self-pay | Admitting: Orthopaedic Surgery

## 2011-08-18 ENCOUNTER — Ambulatory Visit (INDEPENDENT_AMBULATORY_CARE_PROVIDER_SITE_OTHER): Payer: Medicare Other | Admitting: Internal Medicine

## 2011-08-18 ENCOUNTER — Encounter: Payer: Self-pay | Admitting: Internal Medicine

## 2011-08-18 DIAGNOSIS — R82998 Other abnormal findings in urine: Secondary | ICD-10-CM

## 2011-08-18 DIAGNOSIS — R32 Unspecified urinary incontinence: Secondary | ICD-10-CM

## 2011-08-18 DIAGNOSIS — M542 Cervicalgia: Secondary | ICD-10-CM

## 2011-08-18 DIAGNOSIS — R040 Epistaxis: Secondary | ICD-10-CM

## 2011-08-18 DIAGNOSIS — R05 Cough: Secondary | ICD-10-CM | POA: Insufficient documentation

## 2011-08-18 DIAGNOSIS — R8290 Unspecified abnormal findings in urine: Secondary | ICD-10-CM | POA: Insufficient documentation

## 2011-08-18 DIAGNOSIS — E7800 Pure hypercholesterolemia, unspecified: Secondary | ICD-10-CM

## 2011-08-18 DIAGNOSIS — R059 Cough, unspecified: Secondary | ICD-10-CM

## 2011-08-18 DIAGNOSIS — M79603 Pain in arm, unspecified: Secondary | ICD-10-CM

## 2011-08-18 LAB — LIPID PANEL
LDL Cholesterol: 100 mg/dL — ABNORMAL HIGH (ref 0–99)
Triglycerides: 161 mg/dL — ABNORMAL HIGH (ref <150)

## 2011-08-18 MED ORDER — BENZONATATE 100 MG PO CAPS: 100.0000 mg | ORAL_CAPSULE | Freq: Four times a day (QID) | ORAL | Status: DC | PRN

## 2011-08-18 NOTE — Assessment & Plan Note (Signed)
Likely a post-bronchitic cough rolling her recent bronchitis in December 2012. The patient is notably on an ACE inhibitor, therefore cannot be excluded that this is an ACE induced cough. There is no evidence of her physical exam, vital signs, or history that this is a new pneumonia. As well, there is no indication per history that this is secondary to uncontrolled GERD, and the patient is already on a PPI. Lastly, the patient does not indicate symptoms of postnasal drip, and none is evidence on exam - thereby, making this less likely.  Prescription given for Tessalon Perles  Recommended followup within one month   If the patient continues to have cough at that point, may need to have a chest x-ray to further evaluate. She does not have personal history of smoking, however does indicate significant secondhand smoke exposure.  If the patient continues to have cough at that point, may also consider to hold ACE inhibitor, possibly switched to an ARB - to evaluate for possible ACE inhibitor induced cough

## 2011-08-18 NOTE — Assessment & Plan Note (Signed)
Symptoms are stable at this time, there is no evidence of active bleeding currently. Her symptoms may acutely just be due to her illness, versus cold weather. However, if the symptoms continue beyond illness, this epistaxis may need to be worked up further with consideration for imaging in the future  Recommend humidifier.  Recommend followup if symptoms recur.  Consider imaging, if symptoms persistent despite conservative therapy.

## 2011-08-18 NOTE — Progress Notes (Signed)
Subjective:   Patient ID: [name] [name] female   DOB: [birth year] 66 y.o.   MRN: [id number]  HPI: [name] is a 66 y.o. with a PMHx of hypothyroidism, HTN, HLD, chronic pain managed at pain management clinic, who presented to clinic today for the following:  1) Cough - patient indicates a 1 month history of essentially nonproductive cough (has had 2 episodes of productive yellow sputum during this time), occasional chills. Denies fevers, sore throat, ear pain or discharge, itchy or watery eyes. She is using her albuterol at night for wheezing and occasional shortness of breath. Has had 4 nose bleeds during this time, felt like her nose started running, then realized it was bleeding, was able to get it to stop by squeezing and holding head down, last episode was last week.   Of note, she was treated with Zpak for a bronchitis at the end of December, when she had similar but more pronounced symptoms symptoms. She initially had improvement of symptoms, and since has had this residual cough.  2) Malodorous urine - patient indicates 1 week history of malorodrous urine, states smells fishy, denies dysuria, hematuria, fevers, chills. Being evaluated for urinary incontinence (see below)  3) Urinary incontinence -  the patient feels leakage of urine after coughing, strenuous activity. She also has incomplete emptying of her bladder, states that she will urinate, and then have to urinate again immediately. She has been recommended [place] exercises, however, despite compliance with these recommendations for several months now, she continues to have significant incontinence. It is disruptive to her daily life, she indicates that she is having to wear pads regularly when she goes out.  4) Preventative care - due for FLP.   Review of Systems: Per HPI.   Current Outpatient Medications: Medication Sig  . albuterol (PROAIR HFA) 108 (90 BASE) MCG/ACT inhaler Inhale 2 puffs into the lungs every 6 (six)  hours as needed for wheezing or shortness of breath.  Marland Kitchen aspirin 81 MG tablet Take 81 mg by mouth daily.    Marland Kitchen buPROPion (WELLBUTRIN XL) 150 MG 24 hr tablet Take 1 tablet by mouth Daily.  . Calcium-Vitamin D-Vitamin K 500-500-40 MG-UNT-MCG CHEW Chew 2 tablets by mouth 2 (two) times daily.   . clonazePAM (KLONOPIN) 0.5 MG tablet Take 0.5 mg by mouth 2 (two) times daily.   . cloNIDine (CATAPRES) 0.1 MG tablet Take 0.1 mg by mouth daily.   . cyclobenzaprine (FLEXERIL) 10 MG tablet Take 10 mg by mouth 3 (three) times daily as needed.  . diclofenac sodium (VOLTAREN) 1 % GEL Apply 1 application topically daily as needed.    . diltiazem (TIAZAC) 240 MG 24 hr capsule Take 1 capsule (240 mg total) by mouth daily.  Marland Kitchen docusate sodium (COLACE) 100 MG capsule Take 200 mg by mouth daily.    . enalapril (VASOTEC) 10 MG tablet Take 1 tablet (10 mg total) by mouth daily.  . fluticasone (FLONASE) 50 MCG/ACT nasal spray 2 sprays by Nasal route daily.  . hyoscyamine (LEVSIN SL) 0.125 MG SL tablet Place 0.125 mg under the tongue every 4 (four) hours as needed. May use two tablets under tongue if pain not resolved by one.   . levothyroxine (SYNTHROID, LEVOTHROID) 88 MCG tablet Take 1 tablet (88 mcg total) by mouth daily.  . methocarbamol (ROBAXIN) 500 MG tablet Take 500 mg by mouth 2 (two) times daily.    . naproxen (NAPROSYN) 500 MG tablet Take 500 mg by mouth 2 (two) times daily  with a meal.    . omeprazole (PRILOSEC) 40 MG capsule Take 1 capsule (40 mg total) by mouth 2 (two) times daily.  . pravastatin (PRAVACHOL) 20 MG tablet Take 1 tablet (20 mg total) by mouth daily.  . pregabalin (LYRICA) 150 MG capsule Take 150 mg by mouth 3 (three) times daily.   . SUMAtriptan (IMITREX) 50 MG tablet Take one tablet by mouth at onset of headache as directed  . venlafaxine (EFFEXOR-XR) 150 MG 24 hr capsule Take 1 tablet by mouth 2 (two) times daily.   Also recently prescribed Zanaflex by her pain specialist, but does not know  the dose or frequency.  Allergies: Allergies  Allergen Reactions  . Diclofenac Sodium     "almost died"  . Nsaids     REACTION: Throat swelling  . Penicillins     REACTION: RASH, TONGUE SWELLING  . Relafen (Nabumetone)   . Voltaren     Past Medical History  Diagnosis Date  . DM type 2 (diabetes mellitus, type 2)   . TIA (transient ischemic attack)     MRI 2007 no changes.  There is question of ischemia versus a demyelinating process.  . Anxiety   . GERD (gastroesophageal reflux disease)   . HTN (hypertension)   . Hypothyroidism   . Depression   . Allergic rhinitis   . Fibromyalgia   . Back pain   . Hemorrhoids   . Migraine headache   . Chest pain     Catheterization 2004 normal coronary arteries  /  nuclear August 2 007, no ischemia  . Joint pain     Pains in multiple joints  . Shortness of breath     March, 2012  . Elevated alkaline phosphatase level   . Dysphagia   . Hiatal hernia   . Abdominal pain, RUQ (right upper quadrant)   . Hypercholesteremia   . Hematochezia   . UTI (lower urinary tract infection)     Past Surgical History  Procedure Date  . Cholecystectomy   . Abdominal hysterectomy 1984  . Breast surgery   . Cardiac catheterization   . Thyroidectomy   . Neuromas     removed from feet  . Wrist surgery     left     Objective:   Physical Exam: Filed Vitals:   08/18/11 0836  BP: 135/85  Pulse: 94  Temp: 97.3 F (36.3 C)     General: Vital signs reviewed and noted. Well-developed, well-nourished, in no acute distress; alert, appropriate and cooperative throughout examination.  Head: Normocephalic, atraumatic.  Eyes: conjunctivae/corneas clear. EOM's intact.  Ears: TM nonerythematous, not bulging, good light reflex bilaterally.  Nose: Mucous membranes moist, not inflammed, nonerythematous.  Throat: Oropharynx nonerythematous, no exudate appreciated.   Neck: No deformities, masses, or tenderness noted.  Lungs:  Normal respiratory  effort. Clear to auscultation BL without crackles or wheezes.  Heart: RRR. S1 and S2 normal without gallop, murmur, or rubs.  Abdomen:  BS normoactive. Soft, Nondistended  No masses or organomegaly. TTP of right lower quadrant, but patient indicates that this is unchanged from her baseline.  Extremities: No pretibial edema.      Assessment & Plan:  Case and plan of care discussed with Dr. Margarito Liner.

## 2011-08-18 NOTE — Patient Instructions (Signed)
   Please follow-up at the clinic in 1 month, at which time we will reevaluate your cough, cholesterol, urine.  There have been changes in your medications  START tessalon perles  Use your albuterol as needed for shortness of breath or wheezing.  Follow-up with the urologist.  Please follow-up in the clinic sooner if needed.  If you have been started on new medication(s), and you develop symptoms concerning for allergic reaction, including, but not limited to, throat closing, tongue swelling, rash, please stop the medication immediately and call the clinic at [phone], and go to the ER.  If symptoms worsen, or new symptoms arise, please call the clinic or go to the ER.  Please bring all of your medications in a bag to your next visit.

## 2011-08-18 NOTE — Assessment & Plan Note (Signed)
We will check a urinalysis today, urine dip indicates trace leukocytes. Will also culture the urine, treat as indicated.

## 2011-08-18 NOTE — Assessment & Plan Note (Signed)
Patient has had ongoing stress/ urge incontinence, has attempted [place] exercises for several months without stabilization of symptoms.  Will refer to urology for further evaluation and consideration of urodynamic testing.

## 2011-08-19 LAB — URINE CULTURE: Colony Count: 50000

## 2011-08-19 LAB — URINALYSIS, ROUTINE W REFLEX MICROSCOPIC
Ketones, ur: NEGATIVE mg/dL
Nitrite: NEGATIVE
Specific Gravity, Urine: 1.01 (ref 1.005–1.030)
Urobilinogen, UA: 0.2 mg/dL (ref 0.0–1.0)

## 2011-08-23 ENCOUNTER — Ambulatory Visit (HOSPITAL_COMMUNITY)
Admission: RE | Admit: 2011-08-23 | Discharge: 2011-08-23 | Disposition: A | Discharge status: Home / Self Care | Payer: Medicare Other | Source: Ambulatory Visit | Attending: Orthopaedic Surgery | Admitting: Orthopaedic Surgery

## 2011-08-23 DIAGNOSIS — M79603 Pain in arm, unspecified: Secondary | ICD-10-CM

## 2011-08-23 DIAGNOSIS — R209 Unspecified disturbances of skin sensation: Secondary | ICD-10-CM | POA: Insufficient documentation

## 2011-08-23 DIAGNOSIS — M5380 Other specified dorsopathies, site unspecified: Secondary | ICD-10-CM | POA: Insufficient documentation

## 2011-08-23 DIAGNOSIS — M25519 Pain in unspecified shoulder: Secondary | ICD-10-CM | POA: Insufficient documentation

## 2011-08-23 DIAGNOSIS — M542 Cervicalgia: Secondary | ICD-10-CM | POA: Insufficient documentation

## 2011-08-23 DIAGNOSIS — M5020 Other cervical disc displacement, unspecified cervical region: Secondary | ICD-10-CM | POA: Insufficient documentation

## 2011-08-23 MED ORDER — GADOBENATE DIMEGLUMINE 529 MG/ML IV SOLN
18.0000 mL | Freq: Once | INTRAVENOUS | Status: AC | PRN
  Administered 2011-08-23: 18 mL via INTRAVENOUS

## 2011-08-30 ENCOUNTER — Telehealth: Payer: Self-pay | Admitting: *Deleted

## 2011-08-30 NOTE — Telephone Encounter (Signed)
Which ever she prefers to do is fine. If she is feeling worse, etc can certainly come in.  Johnette Abraham, D.O.

## 2011-08-30 NOTE — Telephone Encounter (Signed)
Pt called in stating she was seen 2 weeks ago for cough.  She was put on tessalon perles.  Now cough is a little better but she still having rattling in chest.  Cough is productive with very little white mucus.   Denies fever. Do you want pt to wait 2 more weeks and continue with tessalon or do you want to see her sooner?  Pt # [phone]

## 2011-08-31 NOTE — Telephone Encounter (Signed)
Pt is feeling better so appointment made for 2/28 for f/u

## 2011-09-06 ENCOUNTER — Ambulatory Visit: Payer: Medicare Other | Admitting: Physical Medicine & Rehabilitation

## 2011-09-08 ENCOUNTER — Ambulatory Visit (HOSPITAL_COMMUNITY)
Admission: RE | Admit: 2011-09-08 | Discharge: 2011-09-08 | Disposition: A | Discharge status: Home / Self Care | Payer: Medicare Other | Source: Ambulatory Visit | Attending: Internal Medicine | Admitting: Internal Medicine

## 2011-09-08 ENCOUNTER — Ambulatory Visit (INDEPENDENT_AMBULATORY_CARE_PROVIDER_SITE_OTHER): Payer: Medicare Other | Admitting: Internal Medicine

## 2011-09-08 ENCOUNTER — Encounter: Payer: Self-pay | Admitting: Internal Medicine

## 2011-09-08 VITALS — BP 135/84 | HR 83 | Temp 97.4°F | Ht 66.0 in | Wt 198.6 lb

## 2011-09-08 DIAGNOSIS — R05 Cough: Secondary | ICD-10-CM | POA: Insufficient documentation

## 2011-09-08 DIAGNOSIS — R059 Cough, unspecified: Secondary | ICD-10-CM | POA: Insufficient documentation

## 2011-09-08 DIAGNOSIS — R0602 Shortness of breath: Secondary | ICD-10-CM | POA: Insufficient documentation

## 2011-09-08 DIAGNOSIS — E119 Type 2 diabetes mellitus without complications: Secondary | ICD-10-CM

## 2011-09-08 MED ORDER — HYOSCYAMINE SULFATE 0.125 MG SL SUBL: 0.1250 mg | SUBLINGUAL_TABLET | SUBLINGUAL | Status: DC | PRN

## 2011-09-08 MED ORDER — LOSARTAN POTASSIUM 25 MG PO TABS: 25.0000 mg | ORAL_TABLET | Freq: Every day | ORAL | Status: DC

## 2011-09-08 NOTE — Patient Instructions (Signed)
   Please follow-up at the clinic in 4 weeks, at which time we will reevaluate your cough, other chronic medical problems - OR, please follow-up in the clinic sooner if needed.  There have been changes in your medications:  STOP the vasotec (enalapril)  START losartan (Cozaar) for your blood pressure   Please get your chest xray performed, we will call you if results are abnormal.  If you have been started on new medication(s), and you develop symptoms concerning for allergic reaction, including, but not limited to, throat closing, tongue swelling, rash, please stop the medication immediately and call the clinic at [phone], and go to the ER.  If symptoms worsen, or new symptoms arise, please call the clinic or go to the ER.  Please bring all of your medications in a bag to your next visit.

## 2011-09-08 NOTE — Assessment & Plan Note (Signed)
Unclear cause at this point, possibly multifactorial. Previously thought to be a post-bronchitic cough, but having continued symptoms now 8 weeks later, making less likely. ACE-I cough also considered. No evidence of pneumonia or acute pulmonary infection. Also, pt nonsmoker, but has significant second hand smoke exposure. As well, there is no indication per history that this is secondary to uncontrolled GERD, and the patient is already on a PPI. Lastly, the patient does not indicate symptoms of postnasal drip, and none is evidence on exam - thereby, making this less likely.  Continue PRN Tessalon Perles  2 view CXR today - particularly given 2nd hand smoke exposure  STOP ACE-I  Start ARB - low dose losartan.   If continue symptoms despite negative studies, can further explore differentials like GERD refractory to once daily PPI, post-nasal drip, or possible asthma - can consider spirometry to evaluate.

## 2011-09-08 NOTE — Assessment & Plan Note (Signed)
Labs: Lab Results  Component Value Date   HGBA1C 6.1 06/16/2011   HGBA1C 5.5 07/28/2010   CREATININE 0.77 07/27/2011   CREATININE 0.60 01/01/2011   MICROALBUR 0.75 05/19/2010   MICRALBCREAT 9.0 05/19/2010   CHOL 194 08/18/2011   HDL 62 08/18/2011   TRIG 161* 08/18/2011    Last eye exam and foot exam: No results found for this basename: HMDIABEYEEXA, HMDIABFOOTEX     Assessment: Status: Diet controlled. I spoke with Dr. Meredith Pel, her PCP, who indicated that several years ago she was diagnosed with diabetes by an endocrinologist, and she remained on metformin until what seems like 12/12. Then was taken off because was controlled without medications.  Disease Control: controlled  Progress toward goals: at goal  Barriers to meeting goals: no barriers identified   Plan: Glucometer log was not reviewed today, as pt did not have glucometer available for review.      Diet controlled

## 2011-09-08 NOTE — Progress Notes (Signed)
Subjective:    Patient ID: [name] [name] female   DOB: [birth year] 66 y.o.   MRN: [id number]  HPI: [name] [name] is a 66 y.o. with a PMHx of hypothyroidism, HTN, HLD, chronic pain managed at pain management clinic, who presented to clinic today for the following:  1) Cough follow-up - patient was seen in clinic on 02/07, at which time she described a 1 month history of essentially nonproductive cough for which she was thought to have likely post-bronchitic cough since she had a bad bronchitis over Christmas. She was prescribed tessalon perles, were somewhat helpful. Still using Albuterol at bedtime for wheezing, shortness of breath, cough that is worse at night. Having sore throat after coughing fits, but no  fevers, ear pain or discharge, itchy or watery eyes.  Last visit, she described 4 nose bleeds during this time - but has not had recurrence of epistaxis.   Of note, she was treated with Zpak for a bronchitis at the end of December, when she had similar but more pronounced symptoms symptoms. She initially had improvement of symptoms, and since has had this residual cough.   Review of Systems: Per HPI.  Current Outpatient Medications: Medication Sig  . albuterol (PROAIR HFA) 108 (90 BASE) MCG/ACT inhaler Inhale 2 puffs into the lungs every 6 (six) hours as needed for wheezing or shortness of breath.  Marland Kitchen aspirin 81 MG tablet Take 81 mg by mouth daily.    . benzonatate (TESSALON PERLES) 100 MG capsule Take 1 capsule (100 mg total) by mouth every 6 (six) hours as needed for cough.  Marland Kitchen buPROPion (WELLBUTRIN XL) 150 MG 24 hr tablet Take 1 tablet by mouth Daily.  . Calcium-Vitamin D-Vitamin K 500-500-40 MG-UNT-MCG CHEW Chew 2 tablets by mouth 2 (two) times daily.   . clonazePAM (KLONOPIN) 0.5 MG tablet Take 0.5 mg by mouth 2 (two) times daily.   . cloNIDine (CATAPRES) 0.1 MG tablet Take 0.1 mg by mouth daily.   . cyclobenzaprine (FLEXERIL) 10 MG tablet Take 10 mg by mouth 3 (three) times daily as  needed.  . diclofenac sodium (VOLTAREN) 1 % GEL Apply 1 application topically daily as needed.    . diltiazem (TIAZAC) 240 MG 24 hr capsule Take 1 capsule (240 mg total) by mouth daily.  Marland Kitchen docusate sodium (COLACE) 100 MG capsule Take 200 mg by mouth daily.    . enalapril (VASOTEC) 10 MG tablet Take 1 tablet (10 mg total) by mouth daily.  . fluticasone (FLONASE) 50 MCG/ACT nasal spray 2 sprays by Nasal route daily.  . hyoscyamine (LEVSIN SL) 0.125 MG SL tablet Place 0.125 mg under the tongue every 4 (four) hours as needed. May use two tablets under tongue if pain not resolved by one.   . levothyroxine (SYNTHROID, LEVOTHROID) 88 MCG tablet Take 1 tablet (88 mcg total) by mouth daily.  . methocarbamol (ROBAXIN) 500 MG tablet Take 500 mg by mouth 2 (two) times daily.    . naproxen (NAPROSYN) 500 MG tablet Take 500 mg by mouth 2 (two) times daily with a meal.    . omeprazole (PRILOSEC) 40 MG capsule Take 1 capsule (40 mg total) by mouth 2 (two) times daily.  . pravastatin (PRAVACHOL) 20 MG tablet Take 1 tablet (20 mg total) by mouth daily.  . pregabalin (LYRICA) 150 MG capsule Take 150 mg by mouth 3 (three) times daily.   . SUMAtriptan (IMITREX) 50 MG tablet Take one tablet by mouth at onset of headache as directed  .  venlafaxine (EFFEXOR-XR) 150 MG 24 hr capsule Take 1 tablet by mouth 2 (two) times daily.    Allergies: Allergies  Allergen Reactions  . Diclofenac Sodium     "almost died"  . Nsaids     REACTION: Throat swelling  . Penicillins     REACTION: RASH, TONGUE SWELLING  . Relafen (Nabumetone)   . Voltaren     Past Medical History  Diagnosis Date  . DM type 2 (diabetes mellitus, type 2)   . TIA (transient ischemic attack)     MRI 2007 no changes.  There is question of ischemia versus a demyelinating process.  . Anxiety   . GERD (gastroesophageal reflux disease)   . HTN (hypertension)   . Hypothyroidism   . Depression   . Allergic rhinitis   . Fibromyalgia   . Back pain     . Hemorrhoids   . Migraine headache   . Chest pain     Catheterization 2004 normal coronary arteries  /  nuclear August 2 007, no ischemia  . Joint pain     Pains in multiple joints  . Shortness of breath     March, 2012  . Elevated alkaline phosphatase level   . Dysphagia   . Hiatal hernia   . Abdominal pain, RUQ (right upper quadrant)   . Hypercholesteremia   . Hematochezia   . UTI (lower urinary tract infection)     Past Surgical History  Procedure Date  . Cholecystectomy   . Abdominal hysterectomy 1984  . Breast surgery   . Cardiac catheterization   . Thyroidectomy   . Neuromas     removed from feet  . Wrist surgery     left     Objective:    Physical Exam: Filed Vitals:   09/08/11 1042  BP: 135/84  Pulse: 83  Temp: 97.4 F (36.3 C)      General: Vital signs reviewed and noted. Well-developed, well-nourished, in no acute distress; alert, appropriate and cooperative throughout examination.  Head: Normocephalic, atraumatic.  Eyes: conjunctivae/corneas clear. PERRL.  Ears: TM nonerythematous, not bulging, good light reflex bilaterally.  Nose: Mucous membranes moist, mildly inflammed, nonerythematous.  Throat: Oropharynx nonerythematous, no exudate appreciated.   Neck: No deformities, masses, or tenderness noted.  Lungs:  Normal respiratory effort. Clear to auscultation BL without crackles or wheezes.  Heart: RRR. S1 and S2 normal without gallop, rubs. No murmur.  Abdomen:  BS normoactive. Soft, Nondistended, non-tender.  No masses or organomegaly.  Extremities: No pretibial edema.      Assessment/ Plan:   Case and plan of care discussed with Dr. Margarito Liner.

## 2011-09-12 ENCOUNTER — Encounter: Payer: Medicare Other | Admitting: Physical Medicine & Rehabilitation

## 2011-09-20 ENCOUNTER — Telehealth: Payer: Self-pay | Admitting: Physical Medicine & Rehabilitation

## 2011-09-20 NOTE — Telephone Encounter (Signed)
Patient needs some Lyrica.  Patient receives Lyrica through [employer], but has not received shipment due to mistake on [employer] end.  She is now completely out of her Lyrica.  What can be done to help her in the meantime?

## 2011-09-20 NOTE — Telephone Encounter (Signed)
Gabapentin is an option to replace lyrica

## 2011-09-20 NOTE — Telephone Encounter (Signed)
Is Gabapentin and Tramadol a viable solution to the problem at hand?

## 2011-09-20 NOTE — Telephone Encounter (Signed)
Is there something else that we can give her that's not too expensive? Please advise. Thanks.

## 2011-09-21 MED ORDER — GABAPENTIN 300 MG PO CAPS: 300.0000 mg | ORAL_CAPSULE | Freq: Three times a day (TID) | ORAL | Status: DC

## 2011-09-21 NOTE — Telephone Encounter (Signed)
What dose?

## 2011-09-21 NOTE — Telephone Encounter (Signed)
Pt husband aware

## 2011-09-21 NOTE — Telephone Encounter (Signed)
Neurontin 300 mg 3 times a day was written and ordered

## 2011-10-03 ENCOUNTER — Telehealth: Payer: Self-pay | Admitting: Physical Medicine & Rehabilitation

## 2011-10-03 NOTE — Telephone Encounter (Signed)
Pt aware.

## 2011-10-03 NOTE — Telephone Encounter (Signed)
Please advise 

## 2011-10-03 NOTE — Telephone Encounter (Signed)
Dr gave her Gabapentin while waiting to receive Lyrica from Patient Asst.  She likes the Gabapentin.  Does Dr want her to continue the Gabapentin or stay with Lyrica.  If she continues Gabapentin, what does she do with the 4 bottles of Lyrica?

## 2011-10-03 NOTE — Telephone Encounter (Signed)
i would use the lyrica as it was effective before.  When she is nearing the end of the lyrica, she needs to make a decision as to which she would prefer taking

## 2011-10-05 ENCOUNTER — Telehealth: Payer: Self-pay | Admitting: Physical Medicine & Rehabilitation

## 2011-10-05 NOTE — Telephone Encounter (Signed)
Patients mind foggy, shaky, hard time talking, can't write.  Please call.

## 2011-10-06 NOTE — Telephone Encounter (Signed)
Pt advised to go to ED. She understood.

## 2011-10-12 ENCOUNTER — Ambulatory Visit: Payer: Medicare Other | Admitting: Internal Medicine

## 2011-10-14 ENCOUNTER — Encounter: Payer: Medicare Other | Attending: Neurosurgery | Admitting: Physical Medicine & Rehabilitation

## 2011-10-14 ENCOUNTER — Encounter: Payer: Self-pay | Admitting: Physical Medicine & Rehabilitation

## 2011-10-14 VITALS — BP 129/80 | HR 82 | Resp 16 | Ht 66.0 in | Wt 197.0 lb

## 2011-10-14 DIAGNOSIS — M75100 Unspecified rotator cuff tear or rupture of unspecified shoulder, not specified as traumatic: Secondary | ICD-10-CM | POA: Insufficient documentation

## 2011-10-14 DIAGNOSIS — M545 Low back pain, unspecified: Secondary | ICD-10-CM | POA: Insufficient documentation

## 2011-10-14 DIAGNOSIS — F411 Generalized anxiety disorder: Secondary | ICD-10-CM | POA: Insufficient documentation

## 2011-10-14 DIAGNOSIS — M5412 Radiculopathy, cervical region: Secondary | ICD-10-CM

## 2011-10-14 DIAGNOSIS — F329 Major depressive disorder, single episode, unspecified: Secondary | ICD-10-CM | POA: Insufficient documentation

## 2011-10-14 DIAGNOSIS — M67919 Unspecified disorder of synovium and tendon, unspecified shoulder: Secondary | ICD-10-CM | POA: Insufficient documentation

## 2011-10-14 DIAGNOSIS — G43009 Migraine without aura, not intractable, without status migrainosus: Secondary | ICD-10-CM

## 2011-10-14 DIAGNOSIS — F3289 Other specified depressive episodes: Secondary | ICD-10-CM | POA: Insufficient documentation

## 2011-10-14 DIAGNOSIS — M719 Bursopathy, unspecified: Secondary | ICD-10-CM | POA: Insufficient documentation

## 2011-10-14 DIAGNOSIS — IMO0001 Reserved for inherently not codable concepts without codable children: Secondary | ICD-10-CM | POA: Insufficient documentation

## 2011-10-14 NOTE — Patient Instructions (Signed)
Follow up with the neuro surgeon as directed. We can do an epidural shot for your neck if he feels your problem can be managed conservatively.  Be careful not to "over do" things in regard to physical activity with your left arm.

## 2011-10-14 NOTE — Progress Notes (Signed)
Subjective:    Patient ID: [name] [name], female    DOB: 05-Aug-1945, 66 y.o.   MRN: [id number]  HPI [name] [name] is back today regarding her mutliple pain issues. Over the last few months she reports increased pain in her neck and shoulder running down to her left hand. She has had some numbness in the middle three fingers of the left hand.  She had an MRI done last month which reveals foraminal stenosis due to osteophytes and a bulging disk at C6-7 on the left with likely involvement of the 7th nerve root.  Her upper back pain has been better. Her right shoulder does still bother her at night when she sleeps. It also aggravates her when she tries to use it overhead. The pain is in her superior,lateral shoulder radiating to the lateral arm.  She is back on her lyrica which is working well.  Pain Inventory Average Pain 6 Pain Right Now 6 My pain is burning and dull  In the last 24 hours, has pain interfered with the following? General activity 4 Relation with others 4 Enjoyment of life 8 What TIME of day is your pain at its worst? Morning, Daytime, Evening Sleep (in general) Fair  Pain is worse with: bending, sitting, inactivity, standing and some activites Pain improves with: rest, pacing activities and medication Relief from Meds: 8  Mobility use a cane how many minutes can you walk? 30 ability to climb steps?  yes do you drive?  yes Do you have any goals in this area?  yes  Function disabled: date disabled 2004  Neuro/Psych bladder control problems bowel control problems weakness numbness dizziness depression  Prior Studies Any changes since last visit?  no  Physicians involved in your care Any changes since last visit?  no  Review of Systems  Constitutional: Positive for fever, chills and unexpected weight change.  HENT: Negative.   Eyes: Negative.   Respiratory: Positive for cough and wheezing.   Cardiovascular: Positive for leg swelling.  Gastrointestinal:  Positive for nausea, abdominal pain and constipation.  Genitourinary: Positive for urgency.  Musculoskeletal: Negative.   Skin: Negative.   Neurological: Positive for dizziness, weakness and numbness.  Hematological: Negative.   Psychiatric/Behavioral: Negative.        Objective:   Physical Exam  Constitutional: She is oriented to person, place, and time. She appears well-developed and well-nourished.  HENT:  Head: Normocephalic and atraumatic.  Eyes: Conjunctivae and EOM are normal. Pupils are equal, round, and reactive to light.  Neck: Normal range of motion.  Cardiovascular: Normal rate and regular rhythm.   Pulmonary/Chest: Effort normal.  Abdominal: Soft. Bowel sounds are normal.  Musculoskeletal:       Patient has mild right rotator cuff impingement signs. Subacromial bursa area is tender as well. She did have full range of motion however.  Left neck has some tenderness with range of motion. Spurling's test is equivocal to positive. She has generalized tenderness over the left thigh in the soft tissue region of her quadriceps.  Multiple tender points are appreciated today throughout.  Neurological: She is alert and oriented to person, place, and time.       Patient has essentially loss over the C7 distribution. The tendon reflexes are diminished at trace to 1+ throughout both upper extremities however. I did not appreciate any gross motor loss in the upper limbs however today. She does have some pain in addition at the shoulders. Cognitively she is intact and at baseline.  Assessment & Plan:  ASSESSMENT:  1. History of fibromyalgia.  2. Chronic low back pain.  3. Rotator cuff syndrome.  4. Anxiety and depression. 5. Cervical stenosis with left C7 radiculopathy PLAN:  1. She will continue her current medication regimen.  2. She needs a NS eval for her C6/7 stenosis given her clinical symptomatology and impressive MRI findings.  If the NS feels that this can be  managed conservatively, we could do an ESI here. 3. She will continue on Lyrica for her fibromyalgia pain. This has been effective for her. stop Neurontin. 4. We'll stop her Robaxin and continue with Zanaflex for spasms and back pain. This has been effective as well. For. I'll see her back here in about 3 months, or sooner if an injection is indicated.

## 2011-10-17 ENCOUNTER — Encounter: Payer: Self-pay | Admitting: Physical Medicine & Rehabilitation

## 2011-11-16 ENCOUNTER — Other Ambulatory Visit: Payer: Self-pay | Admitting: *Deleted

## 2011-11-16 ENCOUNTER — Ambulatory Visit (INDEPENDENT_AMBULATORY_CARE_PROVIDER_SITE_OTHER): Payer: Medicare Other | Admitting: Internal Medicine

## 2011-11-16 ENCOUNTER — Encounter: Payer: Self-pay | Admitting: Internal Medicine

## 2011-11-16 VITALS — BP 131/81 | HR 78 | Temp 97.8°F | Wt 198.9 lb

## 2011-11-16 DIAGNOSIS — I10 Essential (primary) hypertension: Secondary | ICD-10-CM

## 2011-11-16 DIAGNOSIS — E7800 Pure hypercholesterolemia, unspecified: Secondary | ICD-10-CM

## 2011-11-16 DIAGNOSIS — R109 Unspecified abdominal pain: Secondary | ICD-10-CM | POA: Insufficient documentation

## 2011-11-16 DIAGNOSIS — E119 Type 2 diabetes mellitus without complications: Secondary | ICD-10-CM

## 2011-11-16 DIAGNOSIS — R350 Frequency of micturition: Secondary | ICD-10-CM

## 2011-11-16 DIAGNOSIS — H8110 Benign paroxysmal vertigo, unspecified ear: Secondary | ICD-10-CM

## 2011-11-16 LAB — CBC WITH DIFFERENTIAL/PLATELET
Basophils Absolute: 0 10*3/uL (ref 0.0–0.1)
Eosinophils Absolute: 0.1 10*3/uL (ref 0.0–0.7)
Eosinophils Relative: 2 % (ref 0–5)
Lymphs Abs: 1.6 10*3/uL (ref 0.7–4.0)
MCH: 29.7 pg (ref 26.0–34.0)
MCV: 92.8 fL (ref 78.0–100.0)
Platelets: 300 10*3/uL (ref 150–400)
RDW: 13.9 % (ref 11.5–15.5)

## 2011-11-16 LAB — GLUCOSE, CAPILLARY: Glucose-Capillary: 94 mg/dL (ref 70–99)

## 2011-11-16 LAB — COMPLETE METABOLIC PANEL WITH GFR
ALT: 14 U/L (ref 0–35)
AST: 14 U/L (ref 0–37)
Albumin: 4.7 g/dL (ref 3.5–5.2)
Calcium: 9.5 mg/dL (ref 8.4–10.5)
Chloride: 103 mEq/L (ref 96–112)
Potassium: 4.3 mEq/L (ref 3.5–5.3)
Total Protein: 6.9 g/dL (ref 6.0–8.3)

## 2011-11-16 LAB — POCT GLYCOSYLATED HEMOGLOBIN (HGB A1C): Hemoglobin A1C: 5.9

## 2011-11-16 MED ORDER — LEVOTHYROXINE SODIUM 88 MCG PO TABS: 88.0000 ug | ORAL_TABLET | Freq: Every day | ORAL | Status: DC

## 2011-11-16 MED ORDER — FLUTICASONE PROPIONATE 50 MCG/ACT NA SUSP: 2.0000 | Freq: Every day | NASAL | Status: DC

## 2011-11-16 MED ORDER — PRAVASTATIN SODIUM 20 MG PO TABS: 20.0000 mg | ORAL_TABLET | Freq: Every day | ORAL | Status: DC

## 2011-11-16 MED ORDER — MECLIZINE HCL 12.5 MG PO TABS: 12.5000 mg | ORAL_TABLET | Freq: Three times a day (TID) | ORAL | Status: DC | PRN

## 2011-11-16 NOTE — Assessment & Plan Note (Signed)
Lipids:    Component Value Date/Time   CHOL 194 08/18/2011 0922   TRIG 161* 08/18/2011 0922   HDL 62 08/18/2011 0922   LDLCALC 100* 08/18/2011 0922   VLDL 32 08/18/2011 0922   CHOLHDL 3.1 08/18/2011 0922    Assessment: LDL is just at goal on current dose of pravastatin 20 mg daily.  Plan: Continue pravastatin 20 mg daily.

## 2011-11-16 NOTE — Assessment & Plan Note (Signed)
Assessment: Patient has suprapubic abdominal pain and mild tenderness; past urinalyses and culture have not been convincing for urinary tract infection, although she did have some growth of multiple organisms on her last urine culture.  She has seen a urologist and will follow up with him.  Plan: Will obtain labs to include urinalysis, urine culture, compress metabolic panel, CBC with differential, and lipase.  I advised patient to call or return if her symptoms persist or worsen.  She will follow up with her urologist.

## 2011-11-16 NOTE — Assessment & Plan Note (Signed)
Assessment: As above, patient has had inconclusive urinalyses and urine cultures in the past.  She is being followed by a urologist who she reports has recommended urologic testing.  Plan: Will obtain urinalysis and urine culture today, and will try to get records from her urologist

## 2011-11-16 NOTE — Assessment & Plan Note (Signed)
Lab Results  Component Value Date   NA 140 07/27/2011   K 4.6 07/27/2011   CL 105 07/27/2011   CO2 26 07/27/2011   BUN 12 07/27/2011   CREATININE 0.77 07/27/2011   CREATININE 0.60 01/01/2011    BP Readings from Last 3 Encounters:  11/16/11 131/81  10/14/11 129/80  09/08/11 135/84    Assessment: Because of a persistent cough, patient was switched from enalapril to losartan in February.  Her cough subsequently resolved.  Her blood pressure control is at goal on her current regimen. Progress toward goals:  at goal Barriers to meeting goals:  no barriers identified  Plan: Hypertension treatment:  continue current medications

## 2011-11-16 NOTE — Progress Notes (Signed)
  Subjective:    Patient ID: [name] [name], female    DOB: December 21, 1945, 66 y.o.   MRN: [id number]  HPI Patient returns for followup of her diabetes mellitus, hypertension, and other chronic medical problems.  She also has an acute complaint of recent dizziness, characterized by episodes of vertigo with a sensation of the room spinning that are provoked by sudden movements such as bending over or turning her head, and which resolve within a few seconds if she is still.  She has no associated neurologic symptoms.  She also reports recent lower abdominal pain in the suprapubic area and urinary frequency; she saw her urologist and he apparently recommended urologic testing which she feels that she is unable to afford because of the required copayment.  She is followed in the pain clinic by Dr. Riley Kill, and also was referred to a neurosurgeon in [facility] for management of a left cervical radiculopathy.  She also is followed regularly by her psychiatrist.  She was seen in February here for a persistent cough, and at that time her enalapril was stopped and losartan was started; she reports subsequent resolution of her cough.    Review of Systems  Constitutional: Negative for fever and chills.  Respiratory: Negative for cough and wheezing.   Cardiovascular: Negative for chest pain and leg swelling.  Gastrointestinal: Positive for abdominal pain. Negative for vomiting, diarrhea, blood in stool and anal bleeding.  Genitourinary: Positive for frequency. Negative for dysuria and flank pain.  Neurological: Positive for dizziness.       Objective:   Physical Exam  Constitutional: She is oriented to person, place, and time. No distress.  HENT:  Right Ear: Tympanic membrane, external ear and ear canal normal.  Left Ear: Tympanic membrane, external ear and ear canal normal.  Cardiovascular: Normal rate, regular rhythm and normal heart sounds.  Exam reveals no gallop and no friction rub.   No murmur  heard. Pulmonary/Chest: Effort normal and breath sounds normal. No respiratory distress. She has no wheezes. She has no rales.  Abdominal: Soft. Bowel sounds are normal. There is tenderness (Mild suprapubic tenderness.). There is no rebound and no guarding.  Musculoskeletal: She exhibits no edema.  Neurological: She is alert and oriented to person, place, and time. She has normal strength. She displays no tremor. No cranial nerve deficit. Coordination and gait normal.        Assessment & Plan:

## 2011-11-16 NOTE — Assessment & Plan Note (Signed)
Lab Results  Component Value Date   HGBA1C 5.9 11/16/2011    Assessment: Diabetes control: controlled Progress toward goals: at goal Barriers to meeting goals: no barriers identified  Plan: Diabetes treatment: continue current medications Refer to: none Instruction/counseling given: reminded to bring blood glucose meter & log to each visit

## 2011-11-16 NOTE — Assessment & Plan Note (Signed)
Assessment: Patient symptoms are consistent with benign positional vertigo.  Plan: Plan is refer to physical therapy for vestibular rehabilitation; meclizine 12.5 mg 3 times a day as needed for dizziness (I cautioned her about possible side effects of meclizine).

## 2011-11-16 NOTE — Patient Instructions (Signed)
Start meclizine 12.5 mg one tablet 3 times a day as needed for dizziness. We have made a referral to physical therapy for vestibular exercises for benign positional vertigo. Please call or return if your symptoms persist or worsen.

## 2011-11-17 LAB — URINALYSIS, ROUTINE W REFLEX MICROSCOPIC
Bilirubin Urine: NEGATIVE
Specific Gravity, Urine: 1.011 (ref 1.005–1.030)
Urobilinogen, UA: 0.2 mg/dL (ref 0.0–1.0)

## 2011-11-17 LAB — URINALYSIS, MICROSCOPIC ONLY: Bacteria, UA: NONE SEEN

## 2011-11-18 ENCOUNTER — Telehealth: Payer: Self-pay | Admitting: *Deleted

## 2011-11-18 LAB — URINE CULTURE

## 2011-11-18 MED ORDER — MECLIZINE HCL 25 MG PO TABS: ORAL_TABLET | ORAL | Status: DC

## 2011-11-18 NOTE — Telephone Encounter (Signed)
Opened in error

## 2011-11-18 NOTE — Telephone Encounter (Signed)
Talked with pharmacy - Meclizine 12.5mg  is not covered - OTC, where 25mg  dose will be covered by insurance. Would you like to change directions for 25mg .

## 2011-11-18 NOTE — Telephone Encounter (Signed)
Please find out why the pharmacy is requesting a refill; I started this medication on 5/8, the same day they sent the refill request.

## 2011-11-18 NOTE — Telephone Encounter (Signed)
I changed to 25 mg tablet.

## 2011-11-22 ENCOUNTER — Telehealth: Payer: Self-pay | Admitting: *Deleted

## 2011-11-22 DIAGNOSIS — H8110 Benign paroxysmal vertigo, unspecified ear: Secondary | ICD-10-CM

## 2011-11-22 NOTE — Telephone Encounter (Signed)
Call from [name] [name] wanting to inform you that her dizzy spells are getting worse.  She has been taking meclizine as prescribed without improvement. Also they seem to last longer when she bends over, last about a minute. She said you mentioned sending her for neck therapy and she would like to try that.  She is afraid she may end up falling.  Did you do the referral to physical therapy for vestibular rehabilitation ? Please advise Pt # [phone]

## 2011-11-22 NOTE — Telephone Encounter (Signed)
I did do the referral order; please find out what happened with the referral.  Also, please make an appointment for patient in clinic this week; I can see her in my clinic tomorrow morning if that will work for her.

## 2011-11-22 NOTE — Telephone Encounter (Signed)
Pt able to come in for OV tomorrow.  Scheduled. PT referral is being processed

## 2011-11-23 ENCOUNTER — Emergency Department (HOSPITAL_COMMUNITY)
Admission: EM | Admit: 2011-11-23 | Discharge: 2011-11-23 | Disposition: A | Discharge status: Home / Self Care | Payer: Medicare Other | Attending: Emergency Medicine | Admitting: Emergency Medicine

## 2011-11-23 ENCOUNTER — Emergency Department (HOSPITAL_COMMUNITY): Payer: Medicare Other

## 2011-11-23 ENCOUNTER — Ambulatory Visit: Payer: Medicare Other | Admitting: Internal Medicine

## 2011-11-23 ENCOUNTER — Other Ambulatory Visit: Payer: Self-pay

## 2011-11-23 ENCOUNTER — Encounter (HOSPITAL_COMMUNITY): Payer: Self-pay | Admitting: Emergency Medicine

## 2011-11-23 ENCOUNTER — Ambulatory Visit: Payer: Medicare Other | Attending: Internal Medicine | Admitting: Physical Therapy

## 2011-11-23 DIAGNOSIS — F341 Dysthymic disorder: Secondary | ICD-10-CM | POA: Insufficient documentation

## 2011-11-23 DIAGNOSIS — R209 Unspecified disturbances of skin sensation: Secondary | ICD-10-CM | POA: Insufficient documentation

## 2011-11-23 DIAGNOSIS — M542 Cervicalgia: Secondary | ICD-10-CM | POA: Insufficient documentation

## 2011-11-23 DIAGNOSIS — M25519 Pain in unspecified shoulder: Secondary | ICD-10-CM | POA: Insufficient documentation

## 2011-11-23 DIAGNOSIS — M545 Low back pain, unspecified: Secondary | ICD-10-CM | POA: Insufficient documentation

## 2011-11-23 DIAGNOSIS — R42 Dizziness and giddiness: Secondary | ICD-10-CM

## 2011-11-23 DIAGNOSIS — Z79899 Other long term (current) drug therapy: Secondary | ICD-10-CM | POA: Insufficient documentation

## 2011-11-23 DIAGNOSIS — E119 Type 2 diabetes mellitus without complications: Secondary | ICD-10-CM | POA: Insufficient documentation

## 2011-11-23 DIAGNOSIS — R259 Unspecified abnormal involuntary movements: Secondary | ICD-10-CM | POA: Insufficient documentation

## 2011-11-23 DIAGNOSIS — E7800 Pure hypercholesterolemia, unspecified: Secondary | ICD-10-CM | POA: Insufficient documentation

## 2011-11-23 DIAGNOSIS — K219 Gastro-esophageal reflux disease without esophagitis: Secondary | ICD-10-CM | POA: Insufficient documentation

## 2011-11-23 DIAGNOSIS — G8929 Other chronic pain: Secondary | ICD-10-CM | POA: Insufficient documentation

## 2011-11-23 DIAGNOSIS — H81399 Other peripheral vertigo, unspecified ear: Secondary | ICD-10-CM | POA: Insufficient documentation

## 2011-11-23 DIAGNOSIS — IMO0001 Reserved for inherently not codable concepts without codable children: Secondary | ICD-10-CM | POA: Insufficient documentation

## 2011-11-23 DIAGNOSIS — E039 Hypothyroidism, unspecified: Secondary | ICD-10-CM | POA: Insufficient documentation

## 2011-11-23 DIAGNOSIS — Z8673 Personal history of transient ischemic attack (TIA), and cerebral infarction without residual deficits: Secondary | ICD-10-CM | POA: Insufficient documentation

## 2011-11-23 DIAGNOSIS — Z7982 Long term (current) use of aspirin: Secondary | ICD-10-CM | POA: Insufficient documentation

## 2011-11-23 DIAGNOSIS — R4789 Other speech disturbances: Secondary | ICD-10-CM | POA: Insufficient documentation

## 2011-11-23 HISTORY — DX: Radiculopathy, lumbar region: M54.16

## 2011-11-23 HISTORY — DX: Dorsalgia, unspecified: M54.9

## 2011-11-23 HISTORY — DX: Cervicalgia: M54.2

## 2011-11-23 HISTORY — DX: Other chronic pain: G89.29

## 2011-11-23 HISTORY — DX: Unspecified rotator cuff tear or rupture of right shoulder, not specified as traumatic: M75.101

## 2011-11-23 HISTORY — DX: Radiculopathy, cervical region: M54.12

## 2011-11-23 LAB — URINE MICROSCOPIC-ADD ON

## 2011-11-23 LAB — RAPID URINE DRUG SCREEN, HOSP PERFORMED
Cocaine: NOT DETECTED
Opiates: NOT DETECTED

## 2011-11-23 LAB — URINALYSIS, ROUTINE W REFLEX MICROSCOPIC
Bilirubin Urine: NEGATIVE
Glucose, UA: NEGATIVE mg/dL
Hgb urine dipstick: NEGATIVE
Ketones, ur: NEGATIVE mg/dL
Protein, ur: NEGATIVE mg/dL

## 2011-11-23 LAB — DIFFERENTIAL
Basophils Relative: 1 % (ref 0–1)
Eosinophils Absolute: 0.1 10*3/uL (ref 0.0–0.7)
Eosinophils Relative: 1 % (ref 0–5)
Monocytes Absolute: 0.3 10*3/uL (ref 0.1–1.0)
Monocytes Relative: 6 % (ref 3–12)

## 2011-11-23 LAB — POCT I-STAT, CHEM 8
Creatinine, Ser: 0.8 mg/dL (ref 0.50–1.10)
Hemoglobin: 11.9 g/dL — ABNORMAL LOW (ref 12.0–15.0)
Sodium: 141 mEq/L (ref 135–145)
TCO2: 29 mmol/L (ref 0–100)

## 2011-11-23 LAB — COMPREHENSIVE METABOLIC PANEL
Albumin: 4.2 g/dL (ref 3.5–5.2)
BUN: 11 mg/dL (ref 6–23)
Chloride: 103 mEq/L (ref 96–112)
Creatinine, Ser: 0.74 mg/dL (ref 0.50–1.10)
Total Bilirubin: 0.1 mg/dL — ABNORMAL LOW (ref 0.3–1.2)
Total Protein: 7.1 g/dL (ref 6.0–8.3)

## 2011-11-23 LAB — CK TOTAL AND CKMB (NOT AT ARMC)
CK, MB: 2 ng/mL (ref 0.3–4.0)
Total CK: 106 U/L (ref 7–177)

## 2011-11-23 LAB — CBC
HCT: 34.1 % — ABNORMAL LOW (ref 36.0–46.0)
Hemoglobin: 11.3 g/dL — ABNORMAL LOW (ref 12.0–15.0)
MCH: 30.4 pg (ref 26.0–34.0)
MCHC: 33.1 g/dL (ref 30.0–36.0)
MCV: 91.7 fL (ref 78.0–100.0)

## 2011-11-23 MED ORDER — SODIUM CHLORIDE 0.9 % IV SOLN: INTRAVENOUS | Status: DC

## 2011-11-23 MED ORDER — ACETAMINOPHEN 325 MG PO TABS
650.0000 mg | ORAL_TABLET | Freq: Once | ORAL | Status: AC
  Administered 2011-11-23: 650 mg via ORAL
  Filled 2011-11-23: qty 2

## 2011-11-23 NOTE — Code Documentation (Signed)
66 yo female with h/o fibromyalgia, on disability d/t "nerves", and  Recent admission for dizziness, slurred speech, and epigastric pain.  She reports that symptoms have never gone away, but they come and go with increased intensity. She reports that her 82 yo grandson has moved in with them since the first of the year. NIHSS=0. She does have some twitching of her face and BUE, as well as "fidgeting".  No focal deficits identified. D/w Dr. Clarene Duke. Will cancel code stroke at 1305.

## 2011-11-23 NOTE — ED Provider Notes (Signed)
History     CSN: [id number]  Arrival date & time 11/23/11  1217   First MD Initiated Contact with Patient 11/23/11 1229      Chief Complaint  Patient presents with  . Code Stroke    HPI Pt was seen at 1245.  Per pt, c/o gradual onset and persistence of daily waxing and waning slurred speech, dizziness, "tremor" in her right hand, right shoulder pain, left neck pain, lower back pain, and right posterior thigh "numbness" for the past several years, worse over the past several months.  Pt has been eval by Pain Management for her chronic shoulder, neck and back pain.  Pt has been eval by her PMD for her "dizziness," rx antivert and given vestibular clinic referral.  Pt denies any of these symptoms are new today or that they ever fully go away for the past several years.  States her symptoms worsened after her 16yo grandson moved in with her the beginning of this year.  Denies any change in her chronic symptoms, no CP/SOB, no cough, no fevers, no back pain, no abd pain, no N/V/D.     PMD:  [facility] Past Medical History  Diagnosis Date  . DM type 2 (diabetes mellitus, type 2)   . TIA (transient ischemic attack)     MRI 2007 no changes.  There is question of ischemia versus a demyelinating process.  . Anxiety   . GERD (gastroesophageal reflux disease)   . HTN (hypertension)   . Hypothyroidism   . Depression   . Allergic rhinitis   . Fibromyalgia   . Hemorrhoids   . Migraine headache   . Chest pain     Catheterization 2004 normal coronary arteries  /  nuclear August 2 007, no ischemia  . Joint pain     Pains in multiple joints  . Shortness of breath     March, 2012  . Elevated alkaline phosphatase level   . Dysphagia   . Hiatal hernia   . Abdominal pain, RUQ (right upper quadrant)   . Hypercholesteremia   . Hematochezia   . UTI (lower urinary tract infection)   . Cervical radiculopathy   . Rotator cuff syndrome of right shoulder   . Chronic neck pain   . Chronic back pain     R >  L  . Chronic shoulder pain, right   . Lumbar radicular pain     R>L legs    Past Surgical History  Procedure Date  . Cholecystectomy   . Abdominal hysterectomy 1984  . Breast surgery   . Cardiac catheterization   . Thyroidectomy   . Neuromas     removed from feet  . Wrist surgery     left    Family History  Problem Relation Age of Onset  . Cancer      breast -- grandmother  . Cancer      lung -- uncles (3)  . Leukemia Sister   . Diabetes Father   . Diabetes Mother   . Coronary artery disease Father     History  Substance Use Topics  . Smoking status: Never Smoker   . Smokeless tobacco: Never Used  . Alcohol Use: No    Review of Systems ROS: Statement: All systems negative except as marked or noted in the HPI; Constitutional: Negative for fever and chills.;; Eyes: Negative for eye pain, redness and discharge. ; ; ENMT: Negative for ear pain, hoarseness, nasal congestion, sinus pressure and sore throat. ; ;  Cardiovascular: Negative for chest pain, palpitations, diaphoresis, dyspnea and peripheral edema. ; ; Respiratory: Negative for cough, wheezing and stridor. ; ; Gastrointestinal: Negative for nausea, vomiting, diarrhea, abdominal pain, blood in stool, hematemesis, jaundice and rectal bleeding.; ; Genitourinary: Negative for dysuria, flank pain and hematuria. ; ; Musculoskeletal: Negative for back pain and neck pain. Negative for swelling and trauma.; ; Skin: Negative for pruritus, rash, abrasions, blisters, bruising and skin lesion.; ; Neuro: +dizziness, tremor, slurred speech, paresthesias. Negative for headache, lightheadedness and neck stiffness. Negative for weakness, altered level of consciousness, altered mental status, extremity weakness, involuntary movement, seizure and syncope.     Allergies  Diclofenac sodium; Diclofenac sodium; Nsaids; Penicillins; and Relafen  Home Medications   Current Outpatient Rx  Name Route Sig Dispense Refill  . ALBUTEROL SULFATE  HFA 108 (90 BASE) MCG/ACT IN AERS Inhalation Inhale 2 puffs into the lungs every 6 (six) hours as needed. As needed for shortness of breath and coughing.    . ASPIRIN 81 MG PO TABS Oral Take 81 mg by mouth daily.      . BUPROPION HCL ER (XL) 150 MG PO TB24 Oral Take 1 tablet by mouth Daily.    Marland Kitchen CALCIUM-VITAMIN D-VITAMIN K 500-500-40 MG-UNT-MCG PO CHEW Oral Chew 2 tablets by mouth 2 (two) times daily.     Marland Kitchen CLONAZEPAM 0.5 MG PO TABS Oral Take 0.5 mg by mouth at bedtime as needed. As needed for anxiety.    Marland Kitchen CLONIDINE HCL 0.1 MG PO TABS Oral Take 0.1 mg by mouth at bedtime.     Marland Kitchen DICLOFENAC SODIUM 1 % TD GEL Topical Apply 1 application topically daily as needed. As needed for pain.    Marland Kitchen DILTIAZEM HCL ER BEADS 240 MG PO CP24 Oral Take 240 mg by mouth daily.    Marland Kitchen FLUTICASONE PROPIONATE 50 MCG/ACT NA SUSP Nasal Place 2 sprays into the nose daily.    Marland Kitchen LEVOTHYROXINE SODIUM 88 MCG PO TABS Oral Take 88 mcg by mouth daily.    Marland Kitchen LOSARTAN POTASSIUM 25 MG PO TABS Oral Take 25 mg by mouth daily.    Marland Kitchen MECLIZINE HCL 25 MG PO TABS Oral Take 12.5 mg by mouth 3 (three) times daily as needed. Take one-half tablet three times a day as needed for dizziness.    . OMEPRAZOLE 40 MG PO CPDR Oral Take 40 mg by mouth 2 (two) times daily.    Marland Kitchen PRAVASTATIN SODIUM 20 MG PO TABS Oral Take 20 mg by mouth daily.    Marland Kitchen PREGABALIN 150 MG PO CAPS Oral Take 150 mg by mouth 3 (three) times daily.     Marland Kitchen TIZANIDINE HCL 2 MG PO TABS Oral Take 1-2 mg by mouth. Take one-half to one tablet by mouth every 8 hours as needed.    . TRAZODONE HCL 50 MG PO TABS Oral Take 25-50 mg by mouth at bedtime.     . VENLAFAXINE HCL ER 150 MG PO CP24 Oral Take 1 tablet by mouth daily.     . SUMATRIPTAN SUCCINATE 50 MG PO TABS Oral Take 50 mg by mouth every 2 (two) hours as needed. Take one tablet as needed by mouth at onset of headache as directed.      BP 123/87  Pulse 81  Temp(Src) 98.6 F (37 C) (Oral)  Resp 18  SpO2 97%  Physical  Exam 1250: Physical examination:  Nursing notes reviewed; Vital signs and O2 SAT reviewed;  Constitutional: Well developed, Well nourished, Well hydrated, In no  acute distress; Head:  Normocephalic, atraumatic; Eyes: EOMI, PERRL, No scleral icterus; ENMT: TM's clear bilat. Mouth and pharynx normal, Mucous membranes moist; Neck: Supple, Full range of motion, No lymphadenopathy; Cardiovascular: Regular rate and rhythm, No murmur or gallop; Respiratory: Breath sounds clear & equal bilaterally, No rales, rhonchi, wheezes, Normal respiratory effort/excursion; Chest: Nontender, Movement normal; Abdomen: Soft, Nontender, Nondistended, Normal bowel sounds; Extremities: Pulses normal, No tenderness, No edema, No calf edema or asymmetry.; Neuro: AA&Ox3, Major CN grossly intact.  Strength 5/5 equal bilat UE's and LE's.  DTR 2/4 equal bilat UE's and LE's.  No gross sensory deficits.  Normal cerebellar testing bilat UE's and LE's.  No pronator drift.  Speech clear.  No facial droop.  No nystagmus..; Skin: Color normal, Warm, Dry; Psych:  Affect flat, restless and fidgeting on stretcher, tangential thought process.   ED Course  Procedures   1300:  NIH score 0.  Neuro MD has eval pt.  Code stroke cancelled due to symptoms not acute.  Recommends MRI brain.  EPIC chart review by myself and Neuro staff:  Several notes from 2008 reveal pt has had these symptoms since that time, with many symptoms present before then.     1600:  Pt is currently eating a meal with her right hand, no difficulty swallowing, speech clear, VSS.  Pt has ambulated several times in the ED with steady gait, easy resps.  Not orthostatic.  Neuro service has re-evaluated pt and reviewed MRI brain: recommends d/c home with continued antivert for vertigo, f/u PMD.  T/C to [facility] resident, case discussed, including:  HPI, pertinent PM/SHx, VS/PE, dx testing, ED course and treatment:  Agreeable to f/u in ofc.  Dx testing d/w pt and family.  Questions answered.   Verb understanding, agreeable to d/c home with outpt f/u.   MDM  MDM Reviewed: previous chart, nursing note and vitals Reviewed previous: ECG Interpretation: ECG, labs, x-ray, CT scan and MRI    Date: 11/23/2011  Rate: 80  Rhythm: normal sinus rhythm  QRS Axis: normal  Intervals: normal  ST/T Wave abnormalities: normal  Conduction Disutrbances:none  Narrative Interpretation:   Old EKG Reviewed: unchanged; no significant changes from previous EKG dated 01/01/2011.  Results for orders placed during the hospital encounter of 11/23/11  [facility]      Component Value Range   Prothrombin Time 12.2  11.6 - 15.2 (seconds)   INR 0.89  0.00 - 1.49   APTT      Component Value Range   aPTT 33  24 - 37 (seconds)  CBC      Component Value Range   WBC 5.4  4.0 - 10.5 (K/uL)   RBC 3.72 (*) 3.87 - 5.11 (MIL/uL)   Hemoglobin 11.3 (*) 12.0 - 15.0 (g/dL)   HCT 16.1 (*) 09.6 - 46.0 (%)   MCV 91.7  78.0 - 100.0 (fL)   MCH 30.4  26.0 - 34.0 (pg)   MCHC 33.1  30.0 - 36.0 (g/dL)   RDW 04.5  40.9 - 81.1 (%)   Platelets 256  150 - 400 (K/uL)  DIFFERENTIAL      Component Value Range   Neutrophils Relative 61  43 - 77 (%)   Neutro Abs 3.3  1.7 - 7.7 (K/uL)   Lymphocytes Relative 31  12 - 46 (%)   Lymphs Abs 1.7  0.7 - 4.0 (K/uL)   Monocytes Relative 6  3 - 12 (%)   Monocytes Absolute 0.3  0.1 - 1.0 (K/uL)   Eosinophils  Relative 1  0 - 5 (%)   Eosinophils Absolute 0.1  0.0 - 0.7 (K/uL)   Basophils Relative 1  0 - 1 (%)   Basophils Absolute 0.0  0.0 - 0.1 (K/uL)  COMPREHENSIVE METABOLIC PANEL      Component Value Range   Sodium 138  135 - 145 (mEq/L)   Potassium 3.9  3.5 - 5.1 (mEq/L)   Chloride 103  96 - 112 (mEq/L)   CO2 26  19 - 32 (mEq/L)   Glucose, Bld 93  70 - 99 (mg/dL)   BUN 11  6 - 23 (mg/dL)   Creatinine, Ser 1.61  0.50 - 1.10 (mg/dL)   Calcium 9.5  8.4 - 09.6 (mg/dL)   Total Protein 7.1  6.0 - 8.3 (g/dL)   Albumin 4.2  3.5 - 5.2 (g/dL)   AST 14  0 - 37 (U/L)   ALT 13  0 -  35 (U/L)   Alkaline Phosphatase 108  39 - 117 (U/L)   Total Bilirubin 0.1 (*) 0.3 - 1.2 (mg/dL)   GFR calc non Af Amer 89 (*) >90 (mL/min)   GFR calc Af Amer >90  >90 (mL/min)  CK TOTAL AND CKMB      Component Value Range   Total CK 106  7 - 177 (U/L)   CK, MB 2.0  0.3 - 4.0 (ng/mL)   Relative Index 1.9  0.0 - 2.5   TROPONIN I      Component Value Range   Troponin I <0.30  <0.30 (ng/mL)  POCT I-STAT, CHEM 8      Component Value Range   Sodium 141  135 - 145 (mEq/L)   Potassium 3.9  3.5 - 5.1 (mEq/L)   Chloride 104  96 - 112 (mEq/L)   BUN 11  6 - 23 (mg/dL)   Creatinine, Ser 0.45  0.50 - 1.10 (mg/dL)   Glucose, Bld 92  70 - 99 (mg/dL)   Calcium, Ion 4.09  8.11 - 1.32 (mmol/L)   TCO2 29  0 - 100 (mmol/L)   Hemoglobin 11.9 (*) 12.0 - 15.0 (g/dL)   HCT 91.4 (*) 78.2 - 46.0 (%)  ETHANOL      Component Value Range   Alcohol, Ethyl (B) <11  0 - 11 (mg/dL)  URINALYSIS, ROUTINE W REFLEX MICROSCOPIC      Component Value Range   Color, Urine YELLOW  YELLOW    APPearance CLEAR  CLEAR    Specific Gravity, Urine 1.006  1.005 - 1.030    pH 7.5  5.0 - 8.0    Glucose, UA NEGATIVE  NEGATIVE (mg/dL)   Hgb urine dipstick NEGATIVE  NEGATIVE    Bilirubin Urine NEGATIVE  NEGATIVE    Ketones, ur NEGATIVE  NEGATIVE (mg/dL)   Protein, ur NEGATIVE  NEGATIVE (mg/dL)   Urobilinogen, UA 0.2  0.0 - 1.0 (mg/dL)   Nitrite NEGATIVE  NEGATIVE    Leukocytes, UA TRACE (*) NEGATIVE   URINE RAPID DRUG SCREEN (HOSP PERFORMED)      Component Value Range   Opiates NONE DETECTED  NONE DETECTED    Cocaine NONE DETECTED  NONE DETECTED    Benzodiazepines NONE DETECTED  NONE DETECTED    Amphetamines NONE DETECTED  NONE DETECTED    Tetrahydrocannabinol NONE DETECTED  NONE DETECTED    Barbiturates NONE DETECTED  NONE DETECTED   URINE MICROSCOPIC-ADD ON      Component Value Range   Squamous Epithelial / LPF RARE  RARE    WBC,  UA 0-2  <3 (WBC/hpf)   RBC / HPF 0-2  <3 (RBC/hpf)  TSH      Component Value Range    TSH 1.933  0.350 - 4.500 (uIU/mL)   Ct Head Wo Contrast 11/23/2011  *RADIOLOGY REPORT*  Clinical Data: Slurred speech.  Right-sided numbness.  CT HEAD WITHOUT CONTRAST  Technique:  Contiguous axial images were obtained from the base of the skull through the vertex without contrast.  Comparison: 02/27/2009  Findings: There is no evidence for acute hemorrhage, hydrocephalus, mass lesion, or abnormal extra-axial fluid collection.  No definite CT evidence for acute infarction.  Patchy low attenuation in the deep hemispheric and periventricular white matter is nonspecific, but likely reflects chronic microvascular ischemic demyelination. The visualized paranasal sinuses and mastoid air cells are clear.  IMPRESSION: Stable exam.  Chronic microvascular ischemic white matter demyelination without new or acute interval findings.  I personally called these results directly to Dr. Levin Bacon at 1244 hours on 11/23/2011.  Original Report Authenticated By: ERIC A. MANSELL, M.D.   Mr Brain Wo Contrast 11/23/2011  *RADIOLOGY REPORT*  Clinical Data: Slurred speech and dizziness.  Rule out CVA.  MRI HEAD WITHOUT CONTRAST  Technique:  Multiplanar, multiecho pulse sequences of the brain and surrounding structures were obtained according to standard protocol without intravenous contrast.  Comparison: CT 11/23/2011  Findings: Negative for acute infarct.  Scattered periventricular deep white matter hyperintensities bilaterally compatible with chronic microvascular ischemia.  Brainstem and cerebellum are normal.  Negative for intracranial hemorrhage, or fluid collection. Negative for mass or edema.  Mild chronic sinusitis.  IMPRESSION: Mild chronic microvascular ischemic changes in the white matter. No acute abnormality.  Original Report Authenticated By: Camelia Phenes, M.D.   Dg Chest Port 1 View 11/23/2011  *RADIOLOGY REPORT*  Clinical Data: Evaluate for infiltrate or CHF, headache, abnormal speech  PORTABLE CHEST - 1 VIEW   Comparison: Chest x-ray of 09/08/2011  Findings: The lungs are clear.  No congestive heart failure is seen.  The heart is within normal limits in size.  No bony abnormality is noted.  IMPRESSION: No active lung disease.  Original Report Authenticated By: Juline Patch, M.D.                 Laray Anger, DO 11/25/11 2335

## 2011-11-23 NOTE — Discharge Instructions (Signed)
RESOURCE GUIDE  Dental Problems  Patients with Medicaid: [facility]                     [facility] [address]                                           [address] Phone:  [phone]                                                  Phone:  [phone]  If unable to pay or uninsured, contact:  [facility] or [facility] to become qualified for the adult dental clinic.  Chronic Pain Problems Contact [facility]  [phone] Patients need to be referred by their primary care doctor.  Insufficient Money for Medicine Contact [employer]:  call "211" or [facility] 271-5999.  No Primary Care Doctor Call [facility]  [phone] Other agencies that provide inexpensive medical care    [facility]  [phone]    [facility]  [phone]    [facility]  [phone]    Women's Clinic  [phone]    [facility]  [phone]    [facility]  [phone]  Psychological Services [facility]  [phone] [facility]  [phone] [facility]   [phone] (emergency services [phone])  Substance Abuse Resources [facility]  336-882-2125 [facility] 336-784-9470 [facility] 336-285-9073 [facility] 336-845-3988 Residential & Outpatient Substance Abuse Program  800-659-3381  Abuse/Neglect [place] Child Abuse Hotline (336) 641-3795 [place] Child Abuse Hotline 800-378-5315 (After Hours)  Emergency Shelter [facility] (336) 271-5985  Maternity Homes [facility] (336) 275-9566 [facility] (704) 372-4663  MRSA Hotline #:   832-7006    [place] [facility]  [facility] [place]     [employer]                          [facility] [address] [place]                       [address]      [address]  [place]                                                [place]                            [place] Phone:  [phone]                                   Phone:  [phone]                 Phone:  [phone]  [facility] Phone:  [phone]    [facility] Child Abuse Hotline 514-526-5419 717-291-7832 (After Hours)    Take your usual prescriptions as previously directed.  Call your regular medical doctor tomorrow morning to schedule a follow up appointment this week.  Return to the Emergency Department immediately sooner if worsening.

## 2011-11-23 NOTE — ED Notes (Signed)
Per Dr.McMannus, plan is to have hospitalist evaluate pt then d/c home; pt aware of same

## 2011-11-23 NOTE — ED Notes (Signed)
Pt to room, pt being placed in gown and on monitor. Stroke team at bedside for evaluation and NIH scale.

## 2011-11-23 NOTE — ED Notes (Signed)
Low sodium tray ordered.

## 2011-11-23 NOTE — ED Notes (Signed)
Pt up to restroom, denies any dizziness, states she feels like her speech is getting better

## 2011-11-23 NOTE — Consult Note (Addendum)
[facility] CONSULT NOTE     Reason for Consult: code stroke    HPI:    [name] [name] is an 66 y.o. female who has had a 3 week history of vertigo, long-standing decreased sensation in right leg, long history of decreased hearing in left ear.  She has recently seen her PCP who prescribed vestibular rehab as outpatient with meclizine.  During follow up with her primary care MD he noted some speech difficulty and a code stroke was called. At time of consultation patient was alert and oriented, showed no speech abnormality, facial/oral akathisia.  NIHSS was 0.     Past Medical History  Diagnosis Date  . DM type 2 (diabetes mellitus, type 2)   . TIA (transient ischemic attack)     MRI 2007 no changes.  There is question of ischemia versus a demyelinating process.  . Anxiety   . GERD (gastroesophageal reflux disease)   . HTN (hypertension)   . Hypothyroidism   . Depression   . Allergic rhinitis   . Fibromyalgia   . Back pain   . Hemorrhoids   . Migraine headache   . Chest pain     Catheterization 2004 normal coronary arteries  /  nuclear August 2 007, no ischemia  . Joint pain     Pains in multiple joints  . Shortness of breath     March, 2012  . Elevated alkaline phosphatase level   . Dysphagia   . Hiatal hernia   . Abdominal pain, RUQ (right upper quadrant)   . Hypercholesteremia   . Hematochezia   . UTI (lower urinary tract infection)   . Cervical radiculopathy   . Rotator cuff syndrome of right shoulder   . Chronic neck pain     Past Surgical History  Procedure Date  . Cholecystectomy   . Abdominal hysterectomy 1984  . Breast surgery   . Cardiac catheterization   . Thyroidectomy   . Neuromas     removed from feet  . Wrist surgery     left    Family History  Problem Relation Age of Onset  . Cancer      breast -- grandmother  . Cancer      lung -- uncles (3)  . Leukemia Sister   . Diabetes Father   . Diabetes Mother   .  Coronary artery disease Father     Social History:  reports that she has never smoked. She has never used smokeless tobacco. She reports that she does not drink alcohol or use illicit drugs.  Allergies  Allergen Reactions  . Diclofenac Sodium     "almost died"  . Diclofenac Sodium   . Nsaids     REACTION: Throat swelling  . Penicillins     REACTION: RASH, TONGUE SWELLING  . Relafen (Nabumetone) Other (See Comments)    Unknown     Medications:    Prior to Admission:  Albuterol Asa wellbutrin Klonopin Catapres voltaren Tiazac flonase Synthroid Cozaar prilosec pravachol zaneflex lyrica deseyrel effexor mecizine pravachol Scheduled:   Review of Systems - General ROS: negative for - chills, fatigue, fever or hot flashes Hematological and Lymphatic ROS: negative for - bruising, fatigue, jaundice or pallor Endocrine ROS: negative for - hair pattern changes, hot flashes, mood swings or skin changes Respiratory ROS: negative for - cough, hemoptysis, orthopnea or wheezing Cardiovascular ROS:  negative for - dyspnea on exertion, orthopnea, palpitations or shortness of breath Gastrointestinal ROS: negative for - abdominal pain, appetite loss, blood in stools, diarrhea or hematemesis Musculoskeletal ROS: positive for - joint pain, joint stiffness, joint swelling Neurological ROS: positive for - dizziness, headaches and numbness/tingling Dermatological ROS: negative for dry skin, pruritus and rash   Blood pressure 114/68, pulse 84, temperature 98.4 F (36.9 C), temperature source Oral, resp. rate 17, SpO2 97.00%.   Neurologic Examination:  Mental Status: Alert, oriented.  Speech fluent without evidence of aphasia. Able to follow 3 step commands without difficulty. Very "fidgety" consistent with akathisia. Mild dyskinetic movements involving the lips, possibly also manifestation of akathisia.  Cranial Nerves: II-Visual fields grossly intact. III/IV/VI-Extraocular movements  intact. Complains of diplopia at far gaze. No nystagmus. Pupils reactive bilaterally. V/VII-Smile symmetric, shows akasthesia VIII-grossly intact but decreased on left and states she has had pain in left ear IX/X-normal gag XI-bilateral shoulder shrug XII-midline tongue extension Motor: 5/5 bilaterally with normal tone and bulk, akathisia involving legs >> hands during exam Sensory: Pinprick and light touch intact throughout, bilaterally but stated to be decreased in bilateral stocking distribution Deep Tendon Reflexes: 3+ and symmetric throughout, 2 beats clonus with KJ Plantars: Downgoing bilaterally Cerebellar: Normal finger-to-nose,  normal heel-to-shin test.     Lab Results  Component Value Date/Time   CHOL 194 08/18/2011  9:22 AM    Results for orders placed during the hospital encounter of 11/23/11 (from the past 48 hour(s))  PROTIME-INR     Status: Normal   Collection Time   11/23/11 12:30 PM      Component Value Range Comment   Prothrombin Time 12.2  11.6 - 15.2 (seconds)    INR 0.89  0.00 - 1.49    APTT     Status: Normal   Collection Time   11/23/11 12:30 PM      Component Value Range Comment   aPTT 33  24 - 37 (seconds)   CBC     Status: Abnormal   Collection Time   11/23/11 12:30 PM      Component Value Range Comment   WBC 5.4  4.0 - 10.5 (K/uL)    RBC 3.72 (*) 3.87 - 5.11 (MIL/uL)    Hemoglobin 11.3 (*) 12.0 - 15.0 (g/dL)    HCT 16.1 (*) 09.6 - 46.0 (%)    MCV 91.7  78.0 - 100.0 (fL)    MCH 30.4  26.0 - 34.0 (pg)    MCHC 33.1  30.0 - 36.0 (g/dL)    RDW 04.5  40.9 - 81.1 (%)    Platelets 256  150 - 400 (K/uL)   DIFFERENTIAL     Status: Normal   Collection Time   11/23/11 12:30 PM      Component Value Range Comment   Neutrophils Relative 61  43 - 77 (%)    Neutro Abs 3.3  1.7 - 7.7 (K/uL)    Lymphocytes Relative 31  12 - 46 (%)    Lymphs Abs 1.7  0.7 - 4.0 (K/uL)    Monocytes Relative 6  3 - 12 (%)    Monocytes Absolute 0.3  0.1 - 1.0 (K/uL)    Eosinophils  Relative 1  0 - 5 (%)    Eosinophils Absolute 0.1  0.0 - 0.7 (K/uL)    Basophils Relative 1  0 - 1 (%)    Basophils Absolute 0.0  0.0 - 0.1 (K/uL)   COMPREHENSIVE METABOLIC PANEL     Status:  Abnormal   Collection Time   11/23/11 12:30 PM      Component Value Range Comment   Sodium 138  135 - 145 (mEq/L)    Potassium 3.9  3.5 - 5.1 (mEq/L)    Chloride 103  96 - 112 (mEq/L)    CO2 26  19 - 32 (mEq/L)    Glucose, Bld 93  70 - 99 (mg/dL)    BUN 11  6 - 23 (mg/dL)    Creatinine, Ser 1.61  0.50 - 1.10 (mg/dL)    Calcium 9.5  8.4 - 10.5 (mg/dL)    Total Protein 7.1  6.0 - 8.3 (g/dL)    Albumin 4.2  3.5 - 5.2 (g/dL)    AST 14  0 - 37 (U/L)    ALT 13  0 - 35 (U/L)    Alkaline Phosphatase 108  39 - 117 (U/L)    Total Bilirubin 0.1 (*) 0.3 - 1.2 (mg/dL)    GFR calc non Af Amer 89 (*) >90 (mL/min)    GFR calc Af Amer >90  >90 (mL/min)   CK TOTAL AND CKMB     Status: Normal   Collection Time   11/23/11 12:30 PM      Component Value Range Comment   Total CK 106  7 - 177 (U/L)    CK, MB 2.0  0.3 - 4.0 (ng/mL)    Relative Index 1.9  0.0 - 2.5    TROPONIN I     Status: Normal   Collection Time   11/23/11 12:30 PM      Component Value Range Comment   Troponin I <0.30  <0.30 (ng/mL)   POCT I-STAT, CHEM 8     Status: Abnormal   Collection Time   11/23/11 12:48 PM      Component Value Range Comment   Sodium 141  135 - 145 (mEq/L)    Potassium 3.9  3.5 - 5.1 (mEq/L)    Chloride 104  96 - 112 (mEq/L)    BUN 11  6 - 23 (mg/dL)    Creatinine, Ser 0.96  0.50 - 1.10 (mg/dL)    Glucose, Bld 92  70 - 99 (mg/dL)    Calcium, Ion 0.45  1.12 - 1.32 (mmol/L)    TCO2 29  0 - 100 (mmol/L)    Hemoglobin 11.9 (*) 12.0 - 15.0 (g/dL)    HCT 40.9 (*) 81.1 - 46.0 (%)   ETHANOL     Status: Normal   Collection Time   11/23/11 12:58 PM      Component Value Range Comment   Alcohol, Ethyl (B) <11  0 - 11 (mg/dL)     Ct Head Wo Contrast  11/23/2011  *RADIOLOGY REPORT*  Clinical Data: Slurred speech.   Right-sided numbness.  CT HEAD WITHOUT CONTRAST  Technique:  Contiguous axial images were obtained from the base of the skull through the vertex without contrast.  Comparison: 02/27/2009  Findings: There is no evidence for acute hemorrhage, hydrocephalus, mass lesion, or abnormal extra-axial fluid collection.  No definite CT evidence for acute infarction.  Patchy low attenuation in the deep hemispheric and periventricular white matter is nonspecific, but likely reflects chronic microvascular ischemic demyelination. The visualized paranasal sinuses and mastoid air cells are clear.  IMPRESSION: Stable exam.  Chronic microvascular ischemic white matter demyelination without new or acute interval findings.  I personally called these results directly to Dr. Levin Bacon at 1244 hours on 11/23/2011.  Original Report Authenticated By: Emilea Goga A. MANSELL, M.D.  Dg Chest Port 1 View  11/23/2011  *RADIOLOGY REPORT*  Clinical Data: Evaluate for infiltrate or CHF, headache, abnormal speech  PORTABLE CHEST - 1 VIEW  Comparison: Chest x-ray of 09/08/2011  Findings: The lungs are clear.  No congestive heart failure is seen.  The heart is within normal limits in size.  No bony abnormality is noted.  IMPRESSION: No active lung disease.  Original Report Authenticated By: Juline Patch, M.D.     Assessment/Plan:   1. 66 YO female with multiple symptoms consisting of Vertigo, decreased hearing in left ear, transient speech difficulty (now resolved).  Exam shows no localizing or lateralizing abnormality consistent with stroke. Patients vertiginous sensation likely BPPV.  2. Also with akathisia versus restless legs on exam; if the former, this is possibly anxiety-related.  3. Hyperreflexia.  4. Chronic small vessel ischemic changes involving the white matter of the cerebral hemispheres. MRI personally reviewed.  Recommend: 1) Agree with MRI brain 2) Continue with vestibular rehab 3) Continue ASA daily 4) May consider low dose  valium for vertigo if meclizine of no help. Use with caution given concern for possible sedating effects in patient with multiple CNS-acting medications.  5) TSH to further evaluate possible etiology for hyperreflexia.  6) Follow up with pain medicine clinic or PCP to determine if akathisia versus restless legs on exam. If akathisia, this may be due to anxiety related to her current symptoms and we should therefore hold off on additional medication regarding this symptom for now.  7) No further neurological recommendations. Can be followed up as an outpatient. Neurology signing off.   I have discussed patient with Dr. Otelia Limes and he has seen the patient and agrees with the above mentioned.    Felicie Morn PA-C [facility] [phone]  11/23/2011, 2:32 PM  I have seen and evaluated the patient.   Caryl Pina, MD [facility]

## 2011-11-23 NOTE — ED Notes (Signed)
Report given to shannon, rn in [facility]. Pt aware of poc. Pt ambulatory without difficulty.

## 2011-11-23 NOTE — Telephone Encounter (Signed)
Addended by: Maura Crandall on: 11/23/2011 09:42 AM   Modules accepted: Orders

## 2011-11-23 NOTE — ED Notes (Signed)
Neuro at bedside.

## 2011-11-23 NOTE — ED Notes (Signed)
Pt reports intermittent episodes of trouble speaking x 3 weeks, reports shaking to right hand x several months

## 2011-11-23 NOTE — ED Notes (Signed)
Sitting upright on stretcher eating meal

## 2011-11-23 NOTE — ED Notes (Signed)
Expressive aphasia - onset 0930 this am - has subsequently resolved; also dizziness x3 weeks - had seen MD for dizziness - given RX for Meclizine - states did not help with dizziness; saw neuro in [place] x3 weeks ago - dx'd with "pinched nerve and DDD" - given "traction" to use at home for neck/head; states dizziness present prior to this tx; however, became worse after; reports BLE weakness which she attributes to her fibromyalgia; however, reports "numbness" to dorsal aspect of rgt thigh which is new; presently awake, alert, oriented x4; husband at bedside; currently reports h/a and blurred vision to rgt eye since having MRI today in ED

## 2011-11-23 NOTE — ED Notes (Signed)
Cde stroke, right side weakness and slurred speech. Onset 2 hours ago

## 2011-11-24 ENCOUNTER — Encounter (HOSPITAL_COMMUNITY): Payer: Self-pay | Admitting: Emergency Medicine

## 2011-11-24 LAB — URINE CULTURE

## 2011-11-30 ENCOUNTER — Encounter: Payer: Self-pay | Admitting: Ophthalmology

## 2011-11-30 ENCOUNTER — Ambulatory Visit (INDEPENDENT_AMBULATORY_CARE_PROVIDER_SITE_OTHER): Payer: Medicare Other | Admitting: Ophthalmology

## 2011-11-30 VITALS — BP 117/77 | HR 79 | Temp 97.5°F | Ht 66.0 in | Wt 196.7 lb

## 2011-11-30 DIAGNOSIS — G2571 Drug induced akathisia: Secondary | ICD-10-CM | POA: Insufficient documentation

## 2011-11-30 DIAGNOSIS — G2589 Other specified extrapyramidal and movement disorders: Secondary | ICD-10-CM

## 2011-11-30 DIAGNOSIS — H8110 Benign paroxysmal vertigo, unspecified ear: Secondary | ICD-10-CM

## 2011-11-30 DIAGNOSIS — E119 Type 2 diabetes mellitus without complications: Secondary | ICD-10-CM

## 2011-11-30 LAB — GLUCOSE, CAPILLARY: Glucose-Capillary: 124 mg/dL — ABNORMAL HIGH (ref 70–99)

## 2011-11-30 NOTE — Assessment & Plan Note (Addendum)
Patient is not taking any antipsychotic medications, however akathisia can also occur with venlafaxine or trazodone or benzodiazepines. Patient is on far too many medications. Venlafaxine looks like she has been taking it for several years as far back as 2009. Trazodone is of highest suspicion since it was started fairly recently. Will ask her to stop trazodone and referred her to neurologist for possible further work up for progressive disorder. Also could be restless leg syndrome but symptoms are present in lips and hands and i am hesitant to start any more medications.

## 2011-11-30 NOTE — Assessment & Plan Note (Signed)
Patient will start vestibular rehabilitation this week.

## 2011-11-30 NOTE — Progress Notes (Addendum)
Subjective:   Patient ID: [name] [name] female   DOB: 1945/08/26 66 y.o.   MRN: [id number]  HPI: [name] [name] is a 66 y.o. for follow up for ED visit for stroke.  Vertigo- she has her first appt for vestibular rehabilitation on Friday. Went to ED and was ruled out for stroke with MRI & CT, had neurology consult. Fell at [place] at the end of last year- fell a total of 5 times. Carries a cane periodically now. Most times has been the left side.   Movements of hands, feet and lips for about a year, has gotten worse in past month or two. Has problems swallowing- has had testing, loses voice periodically. Tizandine is new after MRI- pain center, wellbutrin- 6 months & trazodone- new last month. Doctors include- neurosurgeon, mental health, pain center. Need to figure out if any medications could be causing side effects. Controls of leg movements when trying to go to sleep. Walking around seems to help.  C7 Radiculopathy- patient is doing neck traction 30 minutes a day per Neurologist in [facility]- Dr. Alvester Morin.  Complains of sharp pain in left ear and swelling over parotid area.  Complains of a lump on her belly after straining with BM, noticed overnight. Is tender to palpation. Last BM last night. No melena or hematochezia. Taking miralax every other day. She has noticed that she has some fecal matter in her underwear when she goes to urinate for last 4 months. BM is very thin. Was using suppositories and would manually disimpact herself. Has been on miralax for 2 years. Is having BM several times a day. Not hard, normal texture but is having to strain a lot.  Past Medical History  Diagnosis Date  . DM type 2 (diabetes mellitus, type 2)   . TIA (transient ischemic attack)     MRI 2007 no changes.  There is question of ischemia versus a demyelinating process.  . Anxiety   . GERD (gastroesophageal reflux disease)   . HTN (hypertension)   . Hypothyroidism   . Depression   . Allergic  rhinitis   . Fibromyalgia   . Hemorrhoids   . Migraine headache   . Chest pain     Catheterization 2004 normal coronary arteries  /  nuclear August 2 007, no ischemia  . Joint pain     Pains in multiple joints  . Shortness of breath     March, 2012  . Elevated alkaline phosphatase level   . Dysphagia   . Hiatal hernia   . Abdominal pain, RUQ (right upper quadrant)   . Hypercholesteremia   . Hematochezia   . UTI (lower urinary tract infection)   . Cervical radiculopathy   . Rotator cuff syndrome of right shoulder   . Chronic neck pain   . Chronic back pain     R > L  . Chronic shoulder pain, right   . Lumbar radicular pain     R>L legs   Current Outpatient Prescriptions  Medication Sig Dispense Refill  . albuterol (PROVENTIL HFA;VENTOLIN HFA) 108 (90 BASE) MCG/ACT inhaler Inhale 2 puffs into the lungs every 6 (six) hours as needed. As needed for shortness of breath and coughing.      Marland Kitchen aspirin 81 MG tablet Take 81 mg by mouth daily.        Marland Kitchen buPROPion (WELLBUTRIN XL) 150 MG 24 hr tablet Take 1 tablet by mouth Daily.      . Calcium-Vitamin D-Vitamin K 500-500-40 MG-UNT-MCG  CHEW Chew 2 tablets by mouth 2 (two) times daily.       . clonazePAM (KLONOPIN) 0.5 MG tablet Take 0.5 mg by mouth at bedtime as needed. As needed for anxiety.      . cloNIDine (CATAPRES) 0.1 MG tablet Take 0.1 mg by mouth at bedtime.       . diclofenac sodium (VOLTAREN) 1 % GEL Apply 1 application topically daily as needed. As needed for pain.      Marland Kitchen diltiazem (TIAZAC) 240 MG 24 hr capsule Take 240 mg by mouth daily.      . fluticasone (FLONASE) 50 MCG/ACT nasal spray Place 2 sprays into the nose daily.      Marland Kitchen levothyroxine (SYNTHROID, LEVOTHROID) 88 MCG tablet Take 88 mcg by mouth daily.      Marland Kitchen losartan (COZAAR) 25 MG tablet Take 25 mg by mouth daily.      . meclizine (ANTIVERT) 25 MG tablet Take 12.5 mg by mouth 3 (three) times daily as needed. Take one-half tablet three times a day as needed for dizziness.       Marland Kitchen omeprazole (PRILOSEC) 40 MG capsule Take 40 mg by mouth 2 (two) times daily.      . pravastatin (PRAVACHOL) 20 MG tablet Take 20 mg by mouth daily.      . pregabalin (LYRICA) 150 MG capsule Take 150 mg by mouth 3 (three) times daily.       . SUMAtriptan (IMITREX) 50 MG tablet Take 50 mg by mouth every 2 (two) hours as needed. Take one tablet as needed by mouth at onset of headache as directed.      Marland Kitchen tiZANidine (ZANAFLEX) 2 MG tablet Take 1-2 mg by mouth. Take one-half to one tablet by mouth every 8 hours as needed.      . traZODone (DESYREL) 50 MG tablet Take 25-50 mg by mouth at bedtime.       Marland Kitchen venlafaxine (EFFEXOR-XR) 150 MG 24 hr capsule Take 1 tablet by mouth daily.        Family History  Problem Relation Age of Onset  . Cancer      breast -- grandmother  . Cancer      lung -- uncles (3)  . Leukemia Sister   . Diabetes Father   . Diabetes Mother   . Coronary artery disease Father    History   Social History  . Marital Status: Married    Spouse Name: N/A    Number of Children: N/A  . Years of Education: N/A   Social History Main Topics  . Smoking status: Never Smoker   . Smokeless tobacco: Never Used  . Alcohol Use: No  . Drug Use: No  . Sexually Active: None   Other Topics Concern  . None   Social History Narrative  . None    Objective:  Physical Exam: Filed Vitals:   11/30/11 1032  BP: 117/77  Pulse: 79  Temp: 97.5 F (36.4 C)  TempSrc: Oral  Height: 5\' 6"  (1.676 m)  Weight: 196 lb 11.2 oz (89.223 kg)  SpO2: 97%   General: anxious appearing middle aged woman sitting in chair HEENT: PERRL, EOMI, no scleral icterus Abd: soft, nontender, nondistended, BS present Ext: warm and well perfused, no pedal edema Neuro: alert and oriented X3, cranial nerves II-XII grossly intact, patient has involuntary snake-like movement of hands and feet and lips and chin, sensation equal bilaterally, no clonus, babinski downgoing bilaterally  Assessment & Plan:

## 2011-11-30 NOTE — Patient Instructions (Signed)
-  Follow up on referral to neurology for involuntary movements

## 2011-12-02 ENCOUNTER — Ambulatory Visit: Payer: Medicare Other | Admitting: Physical Therapy

## 2011-12-04 ENCOUNTER — Telehealth: Payer: Self-pay | Admitting: Internal Medicine

## 2011-12-04 DIAGNOSIS — E119 Type 2 diabetes mellitus without complications: Secondary | ICD-10-CM

## 2011-12-04 MED ORDER — OXYMETAZOLINE HCL 0.05 % NA SOLN: 2.0000 | Freq: Two times a day (BID) | NASAL | Status: AC

## 2011-12-04 NOTE — Telephone Encounter (Signed)
Pt called at 3:25 pm on Sunday 12/04/2011 for headache, nasal congestion. No focal weakness/numbness or neuro symptoms. Old Neck pain. No fever,  N/V, Chest pain, abd pain. Offered to come in ER if worse, but she liked to get some meanwhile. Advised to use steam inhalation with Flonase and I will send prescription for Oxymetazoline for symptomatic Rx meanwhile. Advised to call back or come to ER if worse. She verbalized understading.

## 2011-12-09 ENCOUNTER — Encounter: Payer: Medicare Other | Admitting: Physical Therapy

## 2011-12-16 ENCOUNTER — Encounter: Payer: Medicare Other | Admitting: Physical Therapy

## 2011-12-21 ENCOUNTER — Ambulatory Visit (INDEPENDENT_AMBULATORY_CARE_PROVIDER_SITE_OTHER): Payer: Medicare Other | Admitting: Internal Medicine

## 2011-12-21 ENCOUNTER — Encounter: Payer: Self-pay | Admitting: Internal Medicine

## 2011-12-21 VITALS — BP 127/82 | HR 81 | Temp 97.4°F | Ht 66.0 in | Wt 196.4 lb

## 2011-12-21 DIAGNOSIS — G2589 Other specified extrapyramidal and movement disorders: Secondary | ICD-10-CM

## 2011-12-21 DIAGNOSIS — H8110 Benign paroxysmal vertigo, unspecified ear: Secondary | ICD-10-CM

## 2011-12-21 DIAGNOSIS — G2571 Drug induced akathisia: Secondary | ICD-10-CM

## 2011-12-21 DIAGNOSIS — I10 Essential (primary) hypertension: Secondary | ICD-10-CM

## 2011-12-21 DIAGNOSIS — E119 Type 2 diabetes mellitus without complications: Secondary | ICD-10-CM

## 2011-12-21 NOTE — Assessment & Plan Note (Signed)
Hemoglobin A1C  Date Value Range Status  11/16/2011 5.9   Final  06/16/2011 6.1   Final  03/09/2011 5.7   Final     Assessment: Diabetes control: controlled Progress toward goals: at goal Barriers to meeting goals: no barriers identified  Plan: Diabetes treatment: continue current medications Refer to: none Instruction/counseling given: reminded to get eye exam

## 2011-12-21 NOTE — Progress Notes (Signed)
  Subjective:    Patient ID: [name] [name], female    DOB: 02/09/1946, 66 y.o.   MRN: [id number]  HPI Patient returns for followup of her benign positional vertigo, diabetes, hypertension, and other medical problems.  Following her last visit here on 11/30/2011, she discontinued trazodone as instructed by Dr. Maisie Fus and reports resolution of her abnormal hand/arm movements.  Her dizziness and vertigo have resolved.  She reports that her chronic neck pain has been gradually worsening over the past year, with some numbness/tingling in her arms and hands (left greater than right).  She reports that she saw her orthopedic surgeon Dr. Hilda Lias in [place], who obtained an MRI and referred her to a neurosurgeon Dr. Kendell Bane of [facility] in [place].  Dr. Alvester Morin referred her for physical therapy of the cervical spine, and she is scheduled for her first visit tomorrow.  Patient has no other complaints today.  She reports that she is compliant with her medications.   Review of Systems  Constitutional: Negative for fever.  Respiratory: Negative for shortness of breath.   Cardiovascular: Negative for chest pain.  Gastrointestinal: Negative for nausea, vomiting and abdominal pain.  Genitourinary: Negative for dysuria.       Objective:   Physical Exam  Constitutional: No distress.  Cardiovascular: Normal rate, regular rhythm and normal heart sounds.  Exam reveals no gallop and no friction rub.   No murmur heard. Pulmonary/Chest: Effort normal and breath sounds normal. No respiratory distress. She has no wheezes. She has no rales.  Abdominal: Soft. Bowel sounds are normal. She exhibits no distension. There is no tenderness. There is no rebound and no guarding.  Musculoskeletal: She exhibits no edema.  Neurological: No abnormal hand or arm movements.      Assessment & Plan:

## 2011-12-21 NOTE — Assessment & Plan Note (Signed)
Assessment: Patient symptoms resolved following the discontinuation of trazodone.  Plan: I discussed this with patient, and advised that she avoid trazodone in the future.

## 2011-12-21 NOTE — Patient Instructions (Signed)
Continue current medications. 

## 2011-12-21 NOTE — Assessment & Plan Note (Signed)
Assessment: Patient's dizziness/vertigo has resolved completely.  Plan: I advised patient to let me know if she has any recurrence of symptoms.

## 2011-12-21 NOTE — Assessment & Plan Note (Signed)
Lab Results  Component Value Date   NA 141 11/23/2011   K 3.9 11/23/2011   CL 104 11/23/2011   CO2 26 11/23/2011   BUN 11 11/23/2011   CREATININE 0.80 11/23/2011    BP Readings from Last 3 Encounters:  12/21/11 127/82  11/30/11 117/77  11/23/11 123/87    Assessment: Hypertension control:  controlled  Progress toward goals:  at goal Barriers to meeting goals:  no barriers identified  Plan: Hypertension treatment:  continue current medications

## 2011-12-22 ENCOUNTER — Ambulatory Visit: Payer: Medicare Other | Attending: Neurosurgery

## 2011-12-22 DIAGNOSIS — IMO0001 Reserved for inherently not codable concepts without codable children: Secondary | ICD-10-CM | POA: Insufficient documentation

## 2011-12-22 DIAGNOSIS — R42 Dizziness and giddiness: Secondary | ICD-10-CM | POA: Insufficient documentation

## 2011-12-23 ENCOUNTER — Encounter: Payer: Medicare Other | Admitting: Physical Therapy

## 2011-12-27 ENCOUNTER — Other Ambulatory Visit: Payer: Self-pay | Admitting: *Deleted

## 2011-12-27 MED ORDER — TIZANIDINE HCL 2 MG PO TABS: 1.0000 mg | ORAL_TABLET | Freq: Three times a day (TID) | ORAL | Status: DC | PRN

## 2011-12-28 ENCOUNTER — Ambulatory Visit: Payer: Medicare Other

## 2011-12-30 ENCOUNTER — Ambulatory Visit: Payer: Medicare Other | Admitting: Physical Therapy

## 2012-01-02 ENCOUNTER — Encounter (INDEPENDENT_AMBULATORY_CARE_PROVIDER_SITE_OTHER): Payer: Medicare Other | Admitting: Ophthalmology

## 2012-01-03 ENCOUNTER — Ambulatory Visit: Payer: Medicare Other

## 2012-01-05 ENCOUNTER — Ambulatory Visit: Payer: Medicare Other | Admitting: Physical Therapy

## 2012-01-08 ENCOUNTER — Emergency Department (HOSPITAL_COMMUNITY): Payer: Medicare Other

## 2012-01-08 ENCOUNTER — Inpatient Hospital Stay (HOSPITAL_COMMUNITY)
Admission: EM | Admit: 2012-01-08 | Discharge: 2012-01-13 | DRG: 494 | Disposition: A | Discharge status: Home / Self Care | Payer: Medicare Other | Attending: Orthopaedic Surgery | Admitting: Orthopaedic Surgery

## 2012-01-08 ENCOUNTER — Encounter (HOSPITAL_COMMUNITY): Payer: Self-pay | Admitting: *Deleted

## 2012-01-08 DIAGNOSIS — Z801 Family history of malignant neoplasm of trachea, bronchus and lung: Secondary | ICD-10-CM

## 2012-01-08 DIAGNOSIS — F411 Generalized anxiety disorder: Secondary | ICD-10-CM | POA: Diagnosis present

## 2012-01-08 DIAGNOSIS — I10 Essential (primary) hypertension: Secondary | ICD-10-CM | POA: Diagnosis present

## 2012-01-08 DIAGNOSIS — F3289 Other specified depressive episodes: Secondary | ICD-10-CM | POA: Diagnosis present

## 2012-01-08 DIAGNOSIS — Z7982 Long term (current) use of aspirin: Secondary | ICD-10-CM

## 2012-01-08 DIAGNOSIS — S82853A Displaced trimalleolar fracture of unspecified lower leg, initial encounter for closed fracture: Principal | ICD-10-CM | POA: Diagnosis present

## 2012-01-08 DIAGNOSIS — Z888 Allergy status to other drugs, medicaments and biological substances status: Secondary | ICD-10-CM

## 2012-01-08 DIAGNOSIS — F329 Major depressive disorder, single episode, unspecified: Secondary | ICD-10-CM | POA: Diagnosis present

## 2012-01-08 DIAGNOSIS — M5412 Radiculopathy, cervical region: Secondary | ICD-10-CM | POA: Diagnosis present

## 2012-01-08 DIAGNOSIS — E119 Type 2 diabetes mellitus without complications: Secondary | ICD-10-CM | POA: Diagnosis present

## 2012-01-08 DIAGNOSIS — Z806 Family history of leukemia: Secondary | ICD-10-CM

## 2012-01-08 DIAGNOSIS — Z8781 Personal history of (healed) traumatic fracture: Secondary | ICD-10-CM | POA: Diagnosis present

## 2012-01-08 DIAGNOSIS — Z833 Family history of diabetes mellitus: Secondary | ICD-10-CM

## 2012-01-08 DIAGNOSIS — G8929 Other chronic pain: Secondary | ICD-10-CM | POA: Diagnosis present

## 2012-01-08 DIAGNOSIS — Z8673 Personal history of transient ischemic attack (TIA), and cerebral infarction without residual deficits: Secondary | ICD-10-CM

## 2012-01-08 DIAGNOSIS — W19XXXA Unspecified fall, initial encounter: Secondary | ICD-10-CM | POA: Diagnosis present

## 2012-01-08 DIAGNOSIS — E039 Hypothyroidism, unspecified: Secondary | ICD-10-CM | POA: Diagnosis present

## 2012-01-08 DIAGNOSIS — IMO0001 Reserved for inherently not codable concepts without codable children: Secondary | ICD-10-CM | POA: Diagnosis present

## 2012-01-08 DIAGNOSIS — E7800 Pure hypercholesterolemia, unspecified: Secondary | ICD-10-CM | POA: Diagnosis present

## 2012-01-08 DIAGNOSIS — Z880 Allergy status to penicillin: Secondary | ICD-10-CM

## 2012-01-08 DIAGNOSIS — G43909 Migraine, unspecified, not intractable, without status migrainosus: Secondary | ICD-10-CM | POA: Diagnosis present

## 2012-01-08 DIAGNOSIS — Z8249 Family history of ischemic heart disease and other diseases of the circulatory system: Secondary | ICD-10-CM

## 2012-01-08 DIAGNOSIS — Z803 Family history of malignant neoplasm of breast: Secondary | ICD-10-CM

## 2012-01-08 DIAGNOSIS — K219 Gastro-esophageal reflux disease without esophagitis: Secondary | ICD-10-CM | POA: Diagnosis present

## 2012-01-08 DIAGNOSIS — Z79899 Other long term (current) drug therapy: Secondary | ICD-10-CM

## 2012-01-08 DIAGNOSIS — M2550 Pain in unspecified joint: Secondary | ICD-10-CM | POA: Diagnosis present

## 2012-01-08 HISTORY — DX: Personal history of (healed) traumatic fracture: Z87.81

## 2012-01-08 LAB — CBC
HCT: 32.2 % — ABNORMAL LOW (ref 36.0–46.0)
MCH: 29.9 pg (ref 26.0–34.0)
MCHC: 32.6 g/dL (ref 30.0–36.0)
Platelets: 270 10*3/uL (ref 150–400)

## 2012-01-08 LAB — BASIC METABOLIC PANEL
CO2: 25 mEq/L (ref 19–32)
Chloride: 104 mEq/L (ref 96–112)
Sodium: 139 mEq/L (ref 135–145)

## 2012-01-08 LAB — PROTIME-INR
INR: 0.89 (ref 0.00–1.49)
Prothrombin Time: 12.2 seconds (ref 11.6–15.2)

## 2012-01-08 LAB — TYPE AND SCREEN
ABO/RH(D): O POS
Antibody Screen: NEGATIVE

## 2012-01-08 MED ORDER — TIZANIDINE HCL 2 MG PO TABS
1.0000 mg | ORAL_TABLET | Freq: Three times a day (TID) | ORAL | Status: DC | PRN
  Administered 2012-01-08: 2 mg via ORAL
  Filled 2012-01-08: qty 1

## 2012-01-08 MED ORDER — SIMVASTATIN 10 MG PO TABS
10.0000 mg | ORAL_TABLET | Freq: Every day | ORAL | Status: DC
  Administered 2012-01-09 – 2012-01-12 (×4): 10 mg via ORAL
  Filled 2012-01-08 (×4): qty 1

## 2012-01-08 MED ORDER — ETOMIDATE 2 MG/ML IV SOLN
10.0000 mg | Freq: Once | INTRAVENOUS | Status: AC
  Administered 2012-01-08: 10 mg via INTRAVENOUS
  Filled 2012-01-08: qty 10

## 2012-01-08 MED ORDER — SODIUM CHLORIDE 0.9 % IV BOLUS (SEPSIS)
500.0000 mL | Freq: Once | INTRAVENOUS | Status: AC
  Administered 2012-01-08: 20:00:00 via INTRAVENOUS

## 2012-01-08 MED ORDER — HYOSCYAMINE SULFATE 0.125 MG SL SUBL: 0.1250 mg | SUBLINGUAL_TABLET | SUBLINGUAL | Status: DC | PRN

## 2012-01-08 MED ORDER — FLUTICASONE PROPIONATE 50 MCG/ACT NA SUSP
2.0000 | Freq: Every day | NASAL | Status: DC
  Filled 2012-01-08: qty 16

## 2012-01-08 MED ORDER — VENLAFAXINE HCL ER 75 MG PO CP24
150.0000 mg | ORAL_CAPSULE | Freq: Every day | ORAL | Status: DC
  Administered 2012-01-10 – 2012-01-13 (×5): 150 mg via ORAL
  Filled 2012-01-08 (×3): qty 2

## 2012-01-08 MED ORDER — CLONAZEPAM 0.5 MG PO TABS
0.5000 mg | ORAL_TABLET | Freq: Every evening | ORAL | Status: DC | PRN
  Administered 2012-01-08 – 2012-01-13 (×2): 0.5 mg via ORAL
  Filled 2012-01-08 (×3): qty 1

## 2012-01-08 MED ORDER — ONDANSETRON HCL 4 MG/2ML IJ SOLN
4.0000 mg | Freq: Once | INTRAMUSCULAR | Status: AC
  Administered 2012-01-08: 4 mg via INTRAVENOUS
  Filled 2012-01-08: qty 2

## 2012-01-08 MED ORDER — SODIUM CHLORIDE 0.9 % IV SOLN: INTRAVENOUS | Status: DC

## 2012-01-08 MED ORDER — DILTIAZEM HCL ER COATED BEADS 240 MG PO CP24
240.0000 mg | ORAL_CAPSULE | Freq: Every day | ORAL | Status: DC
  Administered 2012-01-10 – 2012-01-13 (×4): 240 mg via ORAL
  Filled 2012-01-08 (×5): qty 1

## 2012-01-08 MED ORDER — PANTOPRAZOLE SODIUM 40 MG PO TBEC
40.0000 mg | DELAYED_RELEASE_TABLET | Freq: Every day | ORAL | Status: DC
  Administered 2012-01-09 – 2012-01-13 (×5): 40 mg via ORAL
  Filled 2012-01-08 (×5): qty 1

## 2012-01-08 MED ORDER — GABAPENTIN 300 MG PO CAPS
300.0000 mg | ORAL_CAPSULE | Freq: Three times a day (TID) | ORAL | Status: DC
  Administered 2012-01-09 – 2012-01-13 (×11): 300 mg via ORAL
  Filled 2012-01-08 (×11): qty 1

## 2012-01-08 MED ORDER — LOSARTAN POTASSIUM 50 MG PO TABS
25.0000 mg | ORAL_TABLET | Freq: Every day | ORAL | Status: DC
  Administered 2012-01-10: 12:00:00 via ORAL
  Administered 2012-01-11 – 2012-01-13 (×3): 25 mg via ORAL
  Filled 2012-01-08 (×4): qty 1

## 2012-01-08 MED ORDER — LEVOTHYROXINE SODIUM 88 MCG PO TABS
88.0000 ug | ORAL_TABLET | Freq: Every day | ORAL | Status: DC
  Administered 2012-01-10 – 2012-01-13 (×4): 88 ug via ORAL
  Filled 2012-01-08 (×8): qty 1

## 2012-01-08 MED ORDER — SUMATRIPTAN SUCCINATE 50 MG PO TABS
50.0000 mg | ORAL_TABLET | ORAL | Status: DC | PRN
  Filled 2012-01-08: qty 1

## 2012-01-08 MED ORDER — ONDANSETRON HCL 4 MG/2ML IJ SOLN
4.0000 mg | Freq: Three times a day (TID) | INTRAMUSCULAR | Status: AC | PRN
  Administered 2012-01-08: 4 mg via INTRAVENOUS
  Filled 2012-01-08: qty 2

## 2012-01-08 MED ORDER — HYDROMORPHONE HCL PF 1 MG/ML IJ SOLN
INTRAMUSCULAR | Status: AC
  Administered 2012-01-08: 1 mg via INTRAVENOUS
  Filled 2012-01-08: qty 1

## 2012-01-08 MED ORDER — ALBUTEROL SULFATE HFA 108 (90 BASE) MCG/ACT IN AERS: 2.0000 | INHALATION_SPRAY | Freq: Four times a day (QID) | RESPIRATORY_TRACT | Status: DC | PRN

## 2012-01-08 MED ORDER — HYDROMORPHONE HCL PF 1 MG/ML IJ SOLN
1.0000 mg | INTRAMUSCULAR | Status: DC | PRN
  Administered 2012-01-08 – 2012-01-09 (×3): 1 mg via INTRAVENOUS
  Filled 2012-01-08 (×2): qty 1

## 2012-01-08 MED ORDER — BUPROPION HCL ER (XL) 150 MG PO TB24
150.0000 mg | ORAL_TABLET | Freq: Every day | ORAL | Status: DC
  Administered 2012-01-10 – 2012-01-13 (×4): 150 mg via ORAL
  Filled 2012-01-08 (×8): qty 1

## 2012-01-08 MED ORDER — HYDROMORPHONE HCL PF 1 MG/ML IJ SOLN
1.0000 mg | Freq: Once | INTRAMUSCULAR | Status: AC
  Administered 2012-01-08: 1 mg via INTRAVENOUS
  Filled 2012-01-08: qty 1

## 2012-01-08 MED ORDER — CLINDAMYCIN PHOSPHATE 600 MG/50ML IV SOLN
600.0000 mg | INTRAVENOUS | Status: AC
  Administered 2012-01-10: 600 mg via INTRAVENOUS
  Filled 2012-01-08: qty 50

## 2012-01-08 MED ORDER — MECLIZINE HCL 12.5 MG PO TABS: 12.5000 mg | ORAL_TABLET | Freq: Three times a day (TID) | ORAL | Status: DC | PRN

## 2012-01-08 MED ORDER — CLONIDINE HCL 0.1 MG PO TABS
0.1000 mg | ORAL_TABLET | Freq: Every day | ORAL | Status: DC
  Administered 2012-01-09 – 2012-01-12 (×4): 0.1 mg via ORAL
  Filled 2012-01-08 (×4): qty 1

## 2012-01-08 MED ORDER — PREGABALIN 75 MG PO CAPS
150.0000 mg | ORAL_CAPSULE | Freq: Three times a day (TID) | ORAL | Status: DC
  Administered 2012-01-09 – 2012-01-13 (×11): 150 mg via ORAL
  Filled 2012-01-08 (×11): qty 2

## 2012-01-08 MED ORDER — VITAMIN D 1000 UNITS PO TABS
2000.0000 [IU] | ORAL_TABLET | Freq: Every day | ORAL | Status: DC
  Administered 2012-01-10 – 2012-01-13 (×4): 2000 [IU] via ORAL
  Filled 2012-01-08 (×2): qty 2
  Filled 2012-01-08 (×3): qty 1

## 2012-01-08 MED ORDER — TIZANIDINE HCL 4 MG PO TABS
ORAL_TABLET | ORAL | Status: AC
  Filled 2012-01-08: qty 1

## 2012-01-08 NOTE — ED Notes (Addendum)
Pt slipped off stairs and left ankle popped. Obvious deformity to left ankle.

## 2012-01-08 NOTE — Progress Notes (Signed)
Subjective: Trimalleolar left ankle fracture  The patients requests Dr Hilda Lias  The ER closed reduction is excellent   Orders are in will see in am and discuss with DR KEELING   Objective: Vital signs in last 24 hours: Temp:  [98.9 F (37.2 C)] 98.9 F (37.2 C) (06/30 1934) Pulse Rate:  [84-92] 84  (06/30 2112) Resp:  [16-20] 18  (06/30 2159) BP: (120-156)/(66-84) 127/66 mmHg (06/30 2159) SpO2:  [96 %-100 %] 99 % (06/30 2112) Weight:  [87.998 kg (194 lb)] 87.998 kg (194 lb) (06/30 1934)  Intake/Output from previous day:   Intake/Output this shift:     Basename 01/08/12 2147  HGB 10.5*    Basename 01/08/12 2147  WBC 6.2  RBC 3.51*  HCT 32.2*  PLT 270    Basename 01/08/12 2147  NA 139  K 3.9  CL 104  CO2 25  BUN 13  CREATININE 0.74  GLUCOSE 110*  CALCIUM 9.0    Basename 01/08/12 2147  LABPT --  INR 0.89      Assessment/Plan: CL left trimall ankle fracture   Will need surgery , will discuss with Dr Hilda Lias in am      Fuller Canada 01/08/2012, 10:34 PM

## 2012-01-08 NOTE — ED Provider Notes (Addendum)
History     CSN: [id number]  Arrival date & time 01/08/12  4098   First MD Initiated Contact with Patient 01/08/12 1938      Chief Complaint  Patient presents with  . Ankle Pain    (Consider location/radiation/quality/duration/timing/severity/associated sxs/prior treatment) Patient is a 66 y.o. female presenting with ankle pain. The history is provided by the patient.  Ankle Pain  Pertinent negatives include no numbness.  pt slipped on steps, c/o left ankle pain. Deformity to ankle. Constant, dull, severe pain, no radiation, worse w movement or palpation. No numbness. Skin intact. Denies knee or hip pain. Denies head injury or headache. No neck or back pain. Denies other injury.     Past Medical History  Diagnosis Date  . DM type 2 (diabetes mellitus, type 2)   . TIA (transient ischemic attack)     MRI 2007 no changes.  There is question of ischemia versus a demyelinating process.  . Anxiety   . GERD (gastroesophageal reflux disease)   . HTN (hypertension)   . Hypothyroidism   . Depression   . Allergic rhinitis   . Fibromyalgia   . Hemorrhoids   . Migraine headache   . Chest pain     Catheterization 2004 normal coronary arteries  /  nuclear August 2 007, no ischemia  . Joint pain     Pains in multiple joints  . Shortness of breath     March, 2012  . Elevated alkaline phosphatase level   . Dysphagia   . Hiatal hernia   . Abdominal pain, RUQ (right upper quadrant)   . Hypercholesteremia   . Hematochezia   . UTI (lower urinary tract infection)   . Cervical radiculopathy   . Rotator cuff syndrome of right shoulder   . Chronic neck pain   . Chronic back pain     R > L  . Chronic shoulder pain, right   . Lumbar radicular pain     R>L legs    Past Surgical History  Procedure Date  . Cholecystectomy   . Abdominal hysterectomy 1984  . Breast surgery   . Cardiac catheterization   . Thyroidectomy   . Neuromas     removed from feet  . Wrist surgery     left     Family History  Problem Relation Age of Onset  . Cancer      breast -- grandmother  . Cancer      lung -- uncles (3)  . Leukemia Sister   . Diabetes Father   . Diabetes Mother   . Coronary artery disease Father     History  Substance Use Topics  . Smoking status: Never Smoker   . Smokeless tobacco: Never Used  . Alcohol Use: No    OB History    Grav Para Term Preterm Abortions TAB SAB Ect Mult Living                  Review of Systems  Constitutional: Negative for fever.  HENT: Negative for neck pain.   Gastrointestinal: Negative for nausea and vomiting.  Musculoskeletal: Negative for back pain.  Neurological: Negative for numbness and headaches.    Allergies  Diclofenac sodium; Diclofenac sodium; Nsaids; Penicillins; and Relafen  Home Medications   Current Outpatient Rx  Name Route Sig Dispense Refill  . ALBUTEROL SULFATE HFA 108 (90 BASE) MCG/ACT IN AERS Inhalation Inhale 2 puffs into the lungs every 6 (six) hours as needed. As needed for shortness of  breath and coughing.    . ASPIRIN 81 MG PO TABS Oral Take 81 mg by mouth daily.      . BUPROPION HCL ER (XL) 150 MG PO TB24 Oral Take 1 tablet by mouth Daily.    Marland Kitchen CALCIUM-VITAMIN D-VITAMIN K 500-500-40 MG-UNT-MCG PO CHEW Oral Chew 2 tablets by mouth 2 (two) times daily.     Marland Kitchen VITAMIN D3 2000 UNITS PO TABS Oral Take 1 tablet by mouth daily.    Marland Kitchen CLONAZEPAM 0.5 MG PO TABS Oral Take 0.5 mg by mouth at bedtime as needed. As needed for anxiety.    Marland Kitchen CLONIDINE HCL 0.1 MG PO TABS Oral Take 0.1 mg by mouth at bedtime.     Marland Kitchen DICLOFENAC SODIUM 1 % TD GEL Topical Apply 1 application topically daily as needed. As needed for pain.    Marland Kitchen DILTIAZEM HCL ER BEADS 240 MG PO CP24 Oral Take 240 mg by mouth daily.    Marland Kitchen FLUTICASONE PROPIONATE 50 MCG/ACT NA SUSP Nasal Place 2 sprays into the nose daily.    Marland Kitchen GABAPENTIN 300 MG PO CAPS Oral Take 300 mg by mouth 3 (three) times daily.    Marland Kitchen HYOSCYAMINE SULFATE 0.125 MG SL SUBL  Dissolve  1 to 2 tablets under the tongue every 4 (four) hours as needed for chest pain.    Marland Kitchen LEVOTHYROXINE SODIUM 88 MCG PO TABS Oral Take 88 mcg by mouth daily.    Marland Kitchen LOSARTAN POTASSIUM 25 MG PO TABS Oral Take 25 mg by mouth daily.    Marland Kitchen MECLIZINE HCL 25 MG PO TABS Oral Take 12.5 mg by mouth 3 (three) times daily as needed. Take one-half tablet three times a day as needed for dizziness.    . OMEPRAZOLE 40 MG PO CPDR Oral Take 40 mg by mouth 2 (two) times daily.    Marland Kitchen PRAVASTATIN SODIUM 20 MG PO TABS Oral Take 20 mg by mouth daily.    Marland Kitchen PREGABALIN 150 MG PO CAPS Oral Take 150 mg by mouth 3 (three) times daily.     . SUMATRIPTAN SUCCINATE 50 MG PO TABS Oral Take 50 mg by mouth every 2 (two) hours as needed. Take one tablet as needed by mouth at onset of headache as directed.    Marland Kitchen TIZANIDINE HCL 2 MG PO TABS Oral Take 0.5-1 tablets (1-2 mg total) by mouth every 8 (eight) hours as needed. 90 tablet 2  . VENLAFAXINE HCL ER 150 MG PO CP24 Oral Take 1 tablet by mouth daily.       BP 127/73  Pulse 92  Temp 98.9 F (37.2 C)  Resp 20  Ht 5\' 6"  (1.676 m)  Wt 194 lb (87.998 kg)  BMI 31.31 kg/m2  SpO2 99%  Physical Exam  Nursing note and vitals reviewed. Constitutional: She is oriented to person, place, and time. She appears well-developed and well-nourished. No distress.  HENT:  Head: Atraumatic.  Eyes: Conjunctivae are normal. No scleral icterus.  Neck: Neck supple. No tracheal deviation present.  Cardiovascular: Normal rate, regular rhythm, normal heart sounds and intact distal pulses.   Pulmonary/Chest: Effort normal and breath sounds normal. No respiratory distress. She exhibits no tenderness.  Abdominal: Soft. Normal appearance. She exhibits no distension. There is no tenderness.  Musculoskeletal: She exhibits no edema.       CTLS spine, non tender, aligned, no step off. Deformity, sts, tenderness to left ankle. Skin intact.  Dp/pt palp. No prox tib/fib or knee tenderness. Good rom bil ext, no other  focal bony tenderness noted.   Neurological: She is alert and oriented to person, place, and time.       Motor intact bil.   Skin: Skin is warm and dry. No rash noted.    ED Course  Procedures (including critical care time)  Results for orders placed in visit on 12/21/11  GLUCOSE, CAPILLARY      Component Value Range   Glucose-Capillary 96  70 - 99 mg/dL   Dg Ankle Complete Left  01/08/2012  *RADIOLOGY REPORT*  Clinical Data: Reduction.  LEFT ANKLE COMPLETE - 3+ VIEW  Comparison: 01/08/2012  Findings: Interval reduction of the fracture dislocation.  The dislocation has been reduced.  Trimalleolar fracture again noted with mild displacement fracture fragments.  IMPRESSION: Interval reduction of the dislocation at the ankle.  Decreasing displacement of fracture fragments.  Trimalleolar fracture remains mildly displaced.  Original Report Authenticated By: Cyndie Chime, M.D.   Dg Ankle Complete Left  01/08/2012  *RADIOLOGY REPORT*  Clinical Data: Ankle pain.  LEFT ANKLE COMPLETE - 3+ VIEW  Comparison: None.  Findings: There is severe deformity of the left ankle with fracture dislocation.  Trimalleolar fracture noted.  The talus is dislocated laterally relative to the tibia.  IMPRESSION: Severely displaced fracture dislocation of the left ankle. Trimalleolar fracture.  Original Report Authenticated By: Cyndie Chime, M.D.       MDM  Ice/elevate. Iv ns. Dilaudid 1 mg iv. Xrays.   Pain improved w dilaudid. Recheck on return from xray, pulse present, pain improved. Discussed xrays w pt.  Pt has seen Dr Hilda Lias in past, staff indicates he is out of country, and Dr Romeo Apple is on call - paged Dr Romeo Apple.  Procedural sedation w etomidate re reduction ankle fracture/dislocation.   Discussed displaced trimall fx/disloc w Dr Romeo Apple - he requests we try to reduce/splint and he will see/admit.   Procedural sedation Performed by: Suzi Roots Consent: Verbal consent obtained. Risks and  benefits: risks, benefits and alternatives were discussed Required items: required blood products, implants, devices, and special equipment available Patient identity confirmed: arm band and provided demographic data Time out: Immediately prior to procedure a "time out" was called to verify the correct patient, procedure, equipment, support staff and site/side marked as required.  Sedation type: moderate (conscious) sedation NPO time confirmed and considedered  Sedatives: ETOMIDATE  Physician Time at Bedside: 20  Vitals: Vital signs were monitored during sedation. Cardiac Monitor, pulse oximeter Patient tolerance: Patient tolerated the procedure well with no immediate complications. Comments: Pt with uneventful recovered. Returned to pre-procedural sedation baseline  Pt placed in posterior splint w stirrups. Elevated, ice.  Recheck pt, full awake and alert.   Recheck pain controlled. Distal pulse palp. Normal cap refill in toes.   Repeat xray w reduction of ankle dislocation, improved alignment of fx.   preop labs ordered.     Date: 01/24/2012  Rate: 79  Rhythm: normal sinus rhythm  QRS Axis: normal  Intervals: normal  ST/T Wave abnormalities: normal  Conduction Disutrbances:none  Narrative Interpretation:   Old EKG Reviewed: none available     Suzi Roots, MD 01/08/12 2042  Suzi Roots, MD 01/08/12 1610  Suzi Roots, MD 01/24/12 [phone]

## 2012-01-08 NOTE — ED Notes (Signed)
Pt given Dilaudid 1mg  Verbal order per dr Denton Lank. Orders given for PRN dilaudid 1mg . Pt pain 7/10/ 1mg  given at this time

## 2012-01-09 ENCOUNTER — Encounter: Payer: Medicare Other | Admitting: Physical Therapy

## 2012-01-09 ENCOUNTER — Encounter (HOSPITAL_COMMUNITY): Payer: Self-pay | Admitting: Orthopedic Surgery

## 2012-01-09 LAB — GLUCOSE, CAPILLARY

## 2012-01-09 MED ORDER — VANCOMYCIN HCL 1000 MG IV SOLR
1000.0000 mg | INTRAVENOUS | Status: DC
  Filled 2012-01-09: qty 1000

## 2012-01-09 MED ORDER — HYDROMORPHONE HCL PF 1 MG/ML IJ SOLN
1.0000 mg | INTRAMUSCULAR | Status: DC | PRN
  Administered 2012-01-09 (×3): 1 mg via INTRAVENOUS
  Filled 2012-01-09 (×4): qty 1

## 2012-01-09 MED ORDER — MUPIROCIN 2 % EX OINT
TOPICAL_OINTMENT | Freq: Two times a day (BID) | CUTANEOUS | Status: DC
  Administered 2012-01-09 – 2012-01-13 (×9): via NASAL
  Filled 2012-01-09: qty 22

## 2012-01-09 MED ORDER — HYDROMORPHONE HCL PF 1 MG/ML IJ SOLN
1.5000 mg | INTRAMUSCULAR | Status: DC | PRN
  Administered 2012-01-09 – 2012-01-13 (×27): 1.5 mg via INTRAVENOUS
  Filled 2012-01-09 (×27): qty 2

## 2012-01-09 NOTE — Progress Notes (Signed)
Subjective: My ankle hurts   Objective: Vital signs in last 24 hours: Temp:  [97.9 F (36.6 C)-98.9 F (37.2 C)] 98.1 F (36.7 C) (07/01 0531) Pulse Rate:  [78-92] 78  (07/01 0531) Resp:  [16-20] 19  (07/01 0531) BP: (120-156)/(66-84) 133/83 mmHg (07/01 0531) SpO2:  [91 %-100 %] 99 % (07/01 0531) Weight:  [87.998 kg (194 lb)-94.1 kg (207 lb 7.3 oz)] 94.1 kg (207 lb 7.3 oz) (06/30 2243)  Intake/Output from previous day:   Intake/Output this shift:     Basename 01/08/12 2147  HGB 10.5*    Basename 01/08/12 2147  WBC 6.2  RBC 3.51*  HCT 32.2*  PLT 270    Basename 01/08/12 2147  NA 139  K 3.9  CL 104  CO2 25  BUN 13  CREATININE 0.74  GLUCOSE 110*  CALCIUM 9.0    Basename 01/08/12 2147  LABPT --  INR 0.89    Neurologically intact Neurovascular intact Sensation intact distally Intact pulses distally  Assessment/Plan: For surgery of the left ankle tomorrow at 7:30 am.  I have discussed risks and imponderables with her and her family including infection, embolus, need for cast post surgery, need for physical therapy post surgery, anesthesia risks and possible need for nursing home care if unable to have home health.  They asked appropriate questions.  She has a significant fracture and they appear to understand.  She can eat now.  I have adjusted pain medicine.   Milayah Krell 01/09/2012, 12:35 PM

## 2012-01-09 NOTE — H&P (Signed)
[name] [name] is an 66 y.o. female.   Chief Complaint: Left ankle pain HPI: This is a 66 year old female who is followed in [place] for various medical problems and also for chronic pain and fibromyalgia who fell yesterday injuring her left ankle. She had a severe deformity which was reduced in the emergency room appropriate splinting was applied. He was admitted to the hospital. She complained of severe pain all night despite 2 doses of medications including IV Dilaudid. She denies pain in any other areas of the musculoskeletal system. Her medical history as noted below. She is followed by Dr. Hilda Lias for various orthopedic issues. She has requested that he see her for the current injury as well.  Past Medical History  Diagnosis Date  . DM type 2 (diabetes mellitus, type 2)   . TIA (transient ischemic attack)     MRI 2007 no changes.  There is question of ischemia versus a demyelinating process.  . Anxiety   . GERD (gastroesophageal reflux disease)   . HTN (hypertension)   . Hypothyroidism   . Depression   . Allergic rhinitis   . Fibromyalgia   . Hemorrhoids   . Migraine headache   . Chest pain     Catheterization 2004 normal coronary arteries  /  nuclear August 2 007, no ischemia  . Joint pain     Pains in multiple joints  . Shortness of breath     March, 2012  . Elevated alkaline phosphatase level   . Dysphagia   . Hiatal hernia   . Abdominal pain, RUQ (right upper quadrant)   . Hypercholesteremia   . Hematochezia   . UTI (lower urinary tract infection)   . Cervical radiculopathy   . Rotator cuff syndrome of right shoulder   . Chronic neck pain   . Chronic back pain     R > L  . Chronic shoulder pain, right   . Lumbar radicular pain     R>L legs    Past Surgical History  Procedure Date  . Cholecystectomy   . Abdominal hysterectomy 1984  . Breast surgery   . Cardiac catheterization   . Thyroidectomy   . Neuromas     removed from feet  . Wrist surgery     left      Family History  Problem Relation Age of Onset  . Cancer      breast -- grandmother  . Cancer      lung -- uncles (3)  . Leukemia Sister   . Diabetes Father   . Diabetes Mother   . Coronary artery disease Father    Social History:  reports that she has never smoked. She has never used smokeless tobacco. She reports that she does not drink alcohol or use illicit drugs.  Allergies:  Allergies  Allergen Reactions  . Diclofenac Sodium     "almost died"  . Diclofenac Sodium   . Nsaids     REACTION: Throat swelling  . Penicillins     REACTION: RASH, TONGUE SWELLING  . Relafen (Nabumetone) Other (See Comments)    Unknown     Medications Prior to Admission  Medication Sig Dispense Refill  . albuterol (PROVENTIL HFA;VENTOLIN HFA) 108 (90 BASE) MCG/ACT inhaler Inhale 2 puffs into the lungs every 6 (six) hours as needed. As needed for shortness of breath and coughing.      Marland Kitchen aspirin 81 MG tablet Take 81 mg by mouth daily.        Marland Kitchen  buPROPion (WELLBUTRIN XL) 150 MG 24 hr tablet Take 1 tablet by mouth Daily.      . Calcium-Vitamin D-Vitamin K 500-500-40 MG-UNT-MCG CHEW Chew 2 tablets by mouth 2 (two) times daily.       . Cholecalciferol (VITAMIN D3) 2000 UNITS TABS Take 1 tablet by mouth daily.      . clonazePAM (KLONOPIN) 0.5 MG tablet Take 0.5 mg by mouth at bedtime as needed. As needed for anxiety.      . cloNIDine (CATAPRES) 0.1 MG tablet Take 0.1 mg by mouth at bedtime.       . diclofenac sodium (VOLTAREN) 1 % GEL Apply 1 application topically daily as needed. As needed for pain.      Marland Kitchen diltiazem (TIAZAC) 240 MG 24 hr capsule Take 240 mg by mouth daily.      . fluticasone (FLONASE) 50 MCG/ACT nasal spray Place 2 sprays into the nose daily.      Marland Kitchen gabapentin (NEURONTIN) 300 MG capsule Take 300 mg by mouth 3 (three) times daily.      . hyoscyamine (HYOMAX-SL) 0.125 MG SL tablet Dissolve 1 to 2 tablets under the tongue every 4 (four) hours as needed for chest pain.      Marland Kitchen  levothyroxine (SYNTHROID, LEVOTHROID) 88 MCG tablet Take 88 mcg by mouth daily.      Marland Kitchen losartan (COZAAR) 25 MG tablet Take 25 mg by mouth daily.      . meclizine (ANTIVERT) 25 MG tablet Take 12.5 mg by mouth 3 (three) times daily as needed. Take one-half tablet three times a day as needed for dizziness.      Marland Kitchen omeprazole (PRILOSEC) 40 MG capsule Take 40 mg by mouth 2 (two) times daily.      . pravastatin (PRAVACHOL) 20 MG tablet Take 20 mg by mouth daily.      . pregabalin (LYRICA) 150 MG capsule Take 150 mg by mouth 3 (three) times daily.       . SUMAtriptan (IMITREX) 50 MG tablet Take 50 mg by mouth every 2 (two) hours as needed. Take one tablet as needed by mouth at onset of headache as directed.      Marland Kitchen tiZANidine (ZANAFLEX) 2 MG tablet Take 0.5-1 tablets (1-2 mg total) by mouth every 8 (eight) hours as needed.  90 tablet  2  . venlafaxine (EFFEXOR-XR) 150 MG 24 hr capsule Take 1 tablet by mouth daily.         Results for orders placed during the hospital encounter of 01/08/12 (from the past 48 hour(s))  CBC     Status: Abnormal   Collection Time   01/08/12  9:47 PM      Component Value Range Comment   WBC 6.2  4.0 - 10.5 K/uL    RBC 3.51 (*) 3.87 - 5.11 MIL/uL    Hemoglobin 10.5 (*) 12.0 - 15.0 g/dL    HCT 45.4 (*) 09.8 - 46.0 %    MCV 91.7  78.0 - 100.0 fL    MCH 29.9  26.0 - 34.0 pg    MCHC 32.6  30.0 - 36.0 g/dL    RDW 11.9  14.7 - 82.9 %    Platelets 270  150 - 400 K/uL   BASIC METABOLIC PANEL     Status: Abnormal   Collection Time   01/08/12  9:47 PM      Component Value Range Comment   Sodium 139  135 - 145 mEq/L    Potassium 3.9  3.5 -  5.1 mEq/L    Chloride 104  96 - 112 mEq/L    CO2 25  19 - 32 mEq/L    Glucose, Bld 110 (*) 70 - 99 mg/dL    BUN 13  6 - 23 mg/dL    Creatinine, Ser 1.61  0.50 - 1.10 mg/dL    Calcium 9.0  8.4 - 09.6 mg/dL    GFR calc non Af Amer 89 (*) >90 mL/min    GFR calc Af Amer >90  >90 mL/min   TYPE AND SCREEN     Status: Normal   Collection Time     01/08/12  9:47 PM      Component Value Range Comment   ABO/RH(D) O POS      Antibody Screen NEG      Sample Expiration 01/11/2012     PROTIME-INR     Status: Normal   Collection Time   01/08/12  9:47 PM      Component Value Range Comment   Prothrombin Time 12.2  11.6 - 15.2 seconds    INR 0.89  0.00 - 1.49    Dg Ankle Complete Left  01/08/2012  *RADIOLOGY REPORT*  Clinical Data: Reduction.  LEFT ANKLE COMPLETE - 3+ VIEW  Comparison: 01/08/2012  Findings: Interval reduction of the fracture dislocation.  The dislocation has been reduced.  Trimalleolar fracture again noted with mild displacement fracture fragments.  IMPRESSION: Interval reduction of the dislocation at the ankle.  Decreasing displacement of fracture fragments.  Trimalleolar fracture remains mildly displaced.  Original Report Authenticated By: Cyndie Chime, M.D.   Dg Ankle Complete Left  01/08/2012  *RADIOLOGY REPORT*  Clinical Data: Ankle pain.  LEFT ANKLE COMPLETE - 3+ VIEW  Comparison: None.  Findings: There is severe deformity of the left ankle with fracture dislocation.  Trimalleolar fracture noted.  The talus is dislocated laterally relative to the tibia.  IMPRESSION: Severely displaced fracture dislocation of the left ankle. Trimalleolar fracture.  Original Report Authenticated By: Cyndie Chime, M.D.    Review of Systems  Unable to perform ROS: mental acuity    Blood pressure 133/83, pulse 78, temperature 98.1 F (36.7 C), temperature source Oral, resp. rate 19, height 5\' 6"  (1.676 m), weight 94.1 kg (207 lb 7.3 oz), SpO2 99.00%. Physical Exam  Constitutional: She appears well-developed and well-nourished. She appears lethargic. No distress.  HENT:  Head: Normocephalic.  Eyes: Right eye exhibits no discharge. Left eye exhibits no discharge.  Neck: Neck supple. No tracheal deviation present.  Cardiovascular: Normal rate.   Respiratory: Effort normal.  GI: She exhibits no distension.  Musculoskeletal:        Right shoulder: Normal.       Left shoulder: Normal.       Right elbow: Normal.      Left elbow: Normal.       Right wrist: Normal.       Left wrist: Normal.       Right hip: Normal.       Left hip: Normal.       Right knee: Normal.       Left knee: Normal.       Right ankle: Normal.       Left ankle: She exhibits decreased range of motion, swelling and ecchymosis. She exhibits no deformity, no laceration and normal pulse. tenderness. Lateral malleolus and medial malleolus tenderness found. No AITFL, no CF ligament, no posterior TFL, no head of 5th metatarsal and no proximal fibula tenderness found.  Right upper arm: Normal.       Left upper arm: Normal.       Right forearm: Normal.       Left forearm: Normal.       Right hand: Normal.       Left hand: Normal.       Right upper leg: Normal.       Left upper leg: Normal.       Right lower leg: Normal.       Left lower leg: She exhibits swelling. She exhibits no deformity.       Right foot: Normal.  Neurological: She has normal strength. She appears lethargic. She displays no atrophy and no tremor. No sensory deficit. She exhibits normal muscle tone. She displays no seizure activity. Gait abnormal.  Skin: Skin is warm and dry.  Psychiatric: She has a normal mood and affect. Her behavior is normal. Her speech is slurred.       Sedated from heavy doses of medication      Assessment/Plan Left ankle x-ray showed trimalleolar fracture which was reduced in the emergency room to near-anatomic alignment  Diagnosis trimalleolar closed left ankle fracture  Plan I have spoken to the patient with her family present. I've spoken to Dr. Hilda Lias to the family and the patient requested. He'll see the patient and then we will finalize a treatment plan based on our schedules.  She'll need open treatment internal fixation of the left ankle  Fuller Canada 01/09/2012, 8:50 AM

## 2012-01-09 NOTE — Progress Notes (Signed)
Brief Nutrition Note  Patient identified on the Nutrition Risk Report for Dysphagia. 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  Body mass index is 33.48 kg/(m^2). Marland Kitchen Pt meets criteria forObesity Class I  based on current BMI.   Current diet order is Regular. No po intake yet since pt is to receive first meal this afternoon and she will be NPO after MN due to surgery tomorrow. Labs and medications reviewed.   No further nutrition interventions warranted at this time. If additional nutrition issues arise, please re-consult RD.   Royann Shivers MS,RD,LDN,CSG Office: [phone] Pager: [phone]

## 2012-01-09 NOTE — Progress Notes (Signed)
UR Chart Review Completed  

## 2012-01-10 ENCOUNTER — Encounter (HOSPITAL_COMMUNITY): Payer: Self-pay | Admitting: *Deleted

## 2012-01-10 ENCOUNTER — Encounter (HOSPITAL_COMMUNITY): Payer: Self-pay | Admitting: Anesthesiology

## 2012-01-10 ENCOUNTER — Encounter (HOSPITAL_COMMUNITY)
Admission: EM | Disposition: A | Discharge status: Home / Self Care | Payer: Self-pay | Source: Home / Self Care | Attending: Orthopaedic Surgery

## 2012-01-10 ENCOUNTER — Inpatient Hospital Stay (HOSPITAL_COMMUNITY): Payer: Medicare Other

## 2012-01-10 ENCOUNTER — Inpatient Hospital Stay (HOSPITAL_COMMUNITY): Payer: Medicare Other | Admitting: Anesthesiology

## 2012-01-10 HISTORY — PX: ORIF ANKLE FRACTURE: SHX5408

## 2012-01-10 LAB — MRSA PCR SCREENING: MRSA by PCR: NEGATIVE

## 2012-01-10 SURGERY — OPEN REDUCTION INTERNAL FIXATION (ORIF) ANKLE FRACTURE
Anesthesia: Spinal | Site: Ankle | Laterality: Left | Wound class: Clean

## 2012-01-10 MED ORDER — BUPIVACAINE HCL 0.75 % IJ SOLN
INTRAMUSCULAR | Status: DC | PRN
  Administered 2012-01-10: 1.5 mL via INTRATHECAL

## 2012-01-10 MED ORDER — LACTATED RINGERS IV SOLN
INTRAVENOUS | Status: DC | PRN
  Administered 2012-01-10 (×2): via INTRAVENOUS

## 2012-01-10 MED ORDER — FENTANYL CITRATE 0.05 MG/ML IJ SOLN
INTRAMUSCULAR | Status: AC
  Administered 2012-01-10: 50 ug via INTRAVENOUS
  Filled 2012-01-10: qty 2

## 2012-01-10 MED ORDER — VENLAFAXINE HCL ER 75 MG PO CP24
150.0000 mg | ORAL_CAPSULE | Freq: Two times a day (BID) | ORAL | Status: DC
  Administered 2012-01-10 – 2012-01-12 (×4): 150 mg via ORAL
  Filled 2012-01-10: qty 2
  Filled 2012-01-10: qty 1
  Filled 2012-01-10 (×3): qty 2

## 2012-01-10 MED ORDER — PROPOFOL 10 MG/ML IV EMUL
INTRAVENOUS | Status: AC
  Filled 2012-01-10: qty 20

## 2012-01-10 MED ORDER — SODIUM CHLORIDE 0.9 % IR SOLN
Status: DC | PRN
  Administered 2012-01-10: 1000 mL

## 2012-01-10 MED ORDER — PROMETHAZINE HCL 25 MG/ML IJ SOLN: 12.5000 mg | INTRAMUSCULAR | Status: DC | PRN

## 2012-01-10 MED ORDER — MAGNESIUM HYDROXIDE 400 MG/5ML PO SUSP
30.0000 mL | Freq: Every day | ORAL | Status: DC | PRN
  Filled 2012-01-10: qty 30

## 2012-01-10 MED ORDER — EPHEDRINE SULFATE 50 MG/ML IJ SOLN
INTRAMUSCULAR | Status: AC
  Filled 2012-01-10: qty 1

## 2012-01-10 MED ORDER — MIDAZOLAM HCL 2 MG/2ML IJ SOLN
INTRAMUSCULAR | Status: AC
  Administered 2012-01-10: 1 mg via INTRAVENOUS
  Filled 2012-01-10: qty 2

## 2012-01-10 MED ORDER — ACETAMINOPHEN 10 MG/ML IV SOLN
1000.0000 mg | Freq: Four times a day (QID) | INTRAVENOUS | Status: AC
  Administered 2012-01-10 – 2012-01-11 (×4): 1000 mg via INTRAVENOUS
  Filled 2012-01-10 (×4): qty 100

## 2012-01-10 MED ORDER — FENTANYL CITRATE 0.05 MG/ML IJ SOLN
25.0000 ug | INTRAMUSCULAR | Status: DC | PRN
  Administered 2012-01-10 – 2012-01-12 (×5): 50 ug via INTRAVENOUS
  Filled 2012-01-10: qty 2

## 2012-01-10 MED ORDER — ONDANSETRON HCL 4 MG/2ML IJ SOLN
4.0000 mg | Freq: Once | INTRAMUSCULAR | Status: AC
  Administered 2012-01-10: 4 mg via INTRAVENOUS

## 2012-01-10 MED ORDER — ZOLPIDEM TARTRATE 5 MG PO TABS
5.0000 mg | ORAL_TABLET | Freq: Every day | ORAL | Status: DC
  Administered 2012-01-10 – 2012-01-12 (×3): 5 mg via ORAL
  Filled 2012-01-10 (×3): qty 1

## 2012-01-10 MED ORDER — ONDANSETRON HCL 4 MG/2ML IJ SOLN
INTRAMUSCULAR | Status: AC
  Administered 2012-01-10: 4 mg via INTRAVENOUS
  Filled 2012-01-10: qty 2

## 2012-01-10 MED ORDER — CLINDAMYCIN PHOSPHATE 600 MG/50ML IV SOLN
INTRAVENOUS | Status: AC
  Administered 2012-01-10: 600 mg via INTRAVENOUS
  Filled 2012-01-10: qty 50

## 2012-01-10 MED ORDER — LACTATED RINGERS IV SOLN: INTRAVENOUS | Status: DC

## 2012-01-10 MED ORDER — ACETAMINOPHEN 325 MG PO TABS: 650.0000 mg | ORAL_TABLET | Freq: Four times a day (QID) | ORAL | Status: DC | PRN

## 2012-01-10 MED ORDER — FENTANYL CITRATE 0.05 MG/ML IJ SOLN
INTRAMUSCULAR | Status: DC | PRN
  Administered 2012-01-10: 25 ug via INTRATHECAL

## 2012-01-10 MED ORDER — BUPIVACAINE IN DEXTROSE 0.75-8.25 % IT SOLN
INTRATHECAL | Status: AC
  Filled 2012-01-10: qty 2

## 2012-01-10 MED ORDER — ALBUTEROL SULFATE (5 MG/ML) 0.5% IN NEBU
2.5000 mg | INHALATION_SOLUTION | Freq: Four times a day (QID) | RESPIRATORY_TRACT | Status: DC
  Administered 2012-01-10 – 2012-01-12 (×6): 2.5 mg via RESPIRATORY_TRACT
  Filled 2012-01-10 (×6): qty 0.5

## 2012-01-10 MED ORDER — LIDOCAINE HCL (PF) 1 % IJ SOLN
INTRAMUSCULAR | Status: AC
  Filled 2012-01-10: qty 5

## 2012-01-10 MED ORDER — ENOXAPARIN SODIUM 40 MG/0.4ML ~~LOC~~ SOLN
40.0000 mg | SUBCUTANEOUS | Status: DC
  Administered 2012-01-10 – 2012-01-13 (×4): 40 mg via SUBCUTANEOUS
  Filled 2012-01-10 (×4): qty 0.4

## 2012-01-10 MED ORDER — EPHEDRINE SULFATE 50 MG/ML IJ SOLN
INTRAMUSCULAR | Status: DC | PRN
  Administered 2012-01-10 (×2): 5 mg via INTRAVENOUS
  Administered 2012-01-10: 10 mg via INTRAVENOUS
  Administered 2012-01-10: 5 mg via INTRAVENOUS
  Administered 2012-01-10: 10 mg via INTRAVENOUS

## 2012-01-10 MED ORDER — VANCOMYCIN HCL IN DEXTROSE 1-5 GM/200ML-% IV SOLN
INTRAVENOUS | Status: AC
  Administered 2012-01-10: 1 g
  Filled 2012-01-10: qty 200

## 2012-01-10 MED ORDER — PROPOFOL 10 MG/ML IV EMUL
INTRAVENOUS | Status: DC | PRN
  Administered 2012-01-10: 50 ug/kg/min via INTRAVENOUS
  Administered 2012-01-10: 09:00:00 via INTRAVENOUS

## 2012-01-10 MED ORDER — MIDAZOLAM HCL 2 MG/2ML IJ SOLN
1.0000 mg | INTRAMUSCULAR | Status: DC | PRN
  Administered 2012-01-10: 1 mg via INTRAVENOUS

## 2012-01-10 MED ORDER — LIDOCAINE HCL (CARDIAC) 10 MG/ML IV SOLN
INTRAVENOUS | Status: DC | PRN
  Administered 2012-01-10: 50 mg via INTRAVENOUS

## 2012-01-10 MED ORDER — ONDANSETRON HCL 4 MG/2ML IJ SOLN: 4.0000 mg | Freq: Once | INTRAMUSCULAR | Status: AC | PRN

## 2012-01-10 SURGICAL SUPPLY — 66 items
BAG HAMPER (MISCELLANEOUS) ×2 IMPLANT
BANDAGE ELASTIC 4 VELCRO NS (GAUZE/BANDAGES/DRESSINGS) ×2 IMPLANT
BANDAGE ELASTIC 6 VELCRO NS (GAUZE/BANDAGES/DRESSINGS) ×1 IMPLANT
BANDAGE ESMARK 4X12 BL STRL LF (DISPOSABLE) ×1 IMPLANT
BIT DRILL 2.5X110 QC LCP DISP (BIT) ×2 IMPLANT
BIT DRILL 2.8 (BIT) ×1
BIT DRILL CANN 3.2MM (BIT) IMPLANT
BIT DRILL CANN QC 2.8X165 (BIT) IMPLANT
BIT DRILL JC END 3.2X130 (BIT) ×1 IMPLANT
BLADE SURG 15 STRL LF DISP TIS (BLADE) ×1 IMPLANT
BLADE SURG 15 STRL SS (BLADE) ×2
BNDG CMPR 12X4 ELC STRL LF (DISPOSABLE) ×1
BNDG ESMARK 4X12 BLUE STRL LF (DISPOSABLE) ×2
CLOTH BEACON ORANGE TIMEOUT ST (SAFETY) ×2 IMPLANT
COVER LIGHT HANDLE STERIS (MISCELLANEOUS) ×4 IMPLANT
COVER MAYO STAND XLG (DRAPE) ×2 IMPLANT
CUFF TOURNIQUET SINGLE 34IN LL (TOURNIQUET CUFF) ×2 IMPLANT
DRAPE C-ARM FOLDED MOBILE STRL (DRAPES) ×1 IMPLANT
DRILL BIT 2.8MM (BIT) ×2
DRILL BIT CANN 3.2MM (BIT) ×1
DURAPREP 26ML APPLICATOR (WOUND CARE) ×2 IMPLANT
GAUZE XEROFORM 5X9 LF (GAUZE/BANDAGES/DRESSINGS) ×2 IMPLANT
GLOVE BIO SURGEON STRL SZ8 (GLOVE) ×2 IMPLANT
GLOVE BIO SURGEON STRL SZ8.5 (GLOVE) ×2 IMPLANT
GLOVE ECLIPSE 6.5 STRL STRAW (GLOVE) ×1 IMPLANT
GLOVE EXAM NITRILE MD LF STRL (GLOVE) ×1 IMPLANT
GLOVE INDICATOR 7.0 STRL GRN (GLOVE) ×2 IMPLANT
GLOVE INDICATOR 7.5 STRL GRN (GLOVE) ×2 IMPLANT
GOWN STRL REIN XL XLG (GOWN DISPOSABLE) ×7 IMPLANT
GUIDEWIRE THREADED 1.6 (WIRE) ×4 IMPLANT
INST SET MINOR BONE (KITS) ×2 IMPLANT
K-WIRE 229MX1.6 (WIRE) IMPLANT
K-WIRE 4.0X.028 (WIRE) ×1 IMPLANT
KIT ROOM TURNOVER APOR (KITS) ×2 IMPLANT
MANIFOLD NEPTUNE II (INSTRUMENTS) ×2 IMPLANT
NS IRRIG 1000ML POUR BTL (IV SOLUTION) ×2 IMPLANT
PACK BASIC LIMB (CUSTOM PROCEDURE TRAY) ×2 IMPLANT
PAD ABD 5X9 TENDERSORB (GAUZE/BANDAGES/DRESSINGS) ×5 IMPLANT
PAD ARMBOARD 7.5X6 YLW CONV (MISCELLANEOUS) ×2 IMPLANT
PAD CAST 4YDX4 CTTN HI CHSV (CAST SUPPLIES) ×1 IMPLANT
PADDING CAST COTTON 4X4 STRL (CAST SUPPLIES) ×2
PADDING CAST COTTON 6X4 STRL (CAST SUPPLIES) ×1 IMPLANT
PLATE LCP 3.5 1/3 TUB 5HX57 (Plate) ×1 IMPLANT
SCREW CANC FT ST SFS 4X16 (Screw) ×1 IMPLANT
SCREW CANN 36MM FULLY (Screw) ×2 IMPLANT
SCREW CORTEX 3.5 12MM (Screw) ×1 IMPLANT
SCREW CORTEX 3.5 16MM (Screw) ×1 IMPLANT
SCREW CORTEX 3.5 18MM (Screw) ×1 IMPLANT
SCREW LOCK CORT ST 3.5X12 (Screw) IMPLANT
SCREW LOCK CORT ST 3.5X16 (Screw) IMPLANT
SCREW LOCK CORT ST 3.5X18 (Screw) IMPLANT
SCREW LOCK T15 FT 14X3.5X2.9X (Screw) IMPLANT
SCREW LOCKING 3.5X14 (Screw) ×2 IMPLANT
SCREW PROS MALLEO 55 26MM (Screw) ×1 IMPLANT
SET BASIN LINEN APH (SET/KITS/TRAYS/PACK) ×2 IMPLANT
SPLINT J 5 (CAST SUPPLIES) ×1 IMPLANT
SPONGE GAUZE 4X4 12PLY (GAUZE/BANDAGES/DRESSINGS) ×2 IMPLANT
SPONGE LAP 18X18 X RAY DECT (DISPOSABLE) ×2 IMPLANT
SPONGE LAP 4X18 X RAY DECT (DISPOSABLE) ×2 IMPLANT
STAPLER VISISTAT 35W (STAPLE) ×1 IMPLANT
SUT CHROMIC 2 0 CT 1 (SUTURE) ×4 IMPLANT
SUT NUROLON CT 2 BLK #1 18IN (SUTURE) IMPLANT
SUT PLAIN 2 0 XLH (SUTURE) ×1 IMPLANT
SUT PLAIN CT 1/2CIR 2-0 27IN (SUTURE) ×3 IMPLANT
TOWEL OR 17X26 4PK STRL BLUE (TOWEL DISPOSABLE) ×2 IMPLANT
WASHER FOR 4.5 SCREWS (Washer) ×1 IMPLANT

## 2012-01-10 NOTE — Anesthesia Procedure Notes (Signed)
Spinal  Patient location during procedure: OR Start time: 01/10/2012 8:00 AM Staffing CRNA/Resident: ANDRAZA, Shion Bluestein L Preanesthetic Checklist Completed: patient identified, site marked, surgical consent, pre-op evaluation, timeout performed, IV checked, risks and benefits discussed and monitors and equipment checked Spinal Block Patient position: left lateral decubitus Prep: Betadine Patient monitoring: heart rate, cardiac monitor, continuous pulse ox and blood pressure Approach: left paramedian Location: L3-4 Injection technique: single-shot Needle Needle type: Spinocan  Needle gauge: 22 G Needle length: 9 cm Assessment Sensory level: T8 Additional Notes  ATTEMPTS:1 TRAY ZO:[id number] TRAY EXPIRATION DATE:05/2012  Bupivacaine .75% 1.45ml, fentanyl , epi.1 injected at 0800.  Patient tolerated well.

## 2012-01-10 NOTE — Anesthesia Preprocedure Evaluation (Signed)
Anesthesia Evaluation  Patient identified by MRN, date of birth, ID band Patient awake    Reviewed: Allergy & Precautions, H&P , NPO status , Patient's Chart, lab work & pertinent test results  History of Anesthesia Complications (+) PONV  Airway Mallampati: I TM Distance: >3 FB     Dental  (+) Teeth Intact   Pulmonary shortness of breath and with exertion, asthma ,  breath sounds clear to auscultation        Cardiovascular hypertension, Pt. on medications Rhythm:Regular Rate:Normal     Neuro/Psych  Headaches, PSYCHIATRIC DISORDERS Anxiety Depression TIA Neuromuscular disease    GI/Hepatic hiatal hernia, GERD-  Medicated and Controlled,  Endo/Other  Diabetes mellitus-, Well Controlled, Type 2Hypothyroidism   Renal/GU      Musculoskeletal  (+) Fibromyalgia -  Abdominal   Peds  Hematology   Anesthesia Other Findings   Reproductive/Obstetrics                           Anesthesia Physical Anesthesia Plan  ASA: III  Anesthesia Plan: Spinal   Post-op Pain Management:    Induction:   Airway Management Planned: Nasal Cannula  Additional Equipment:   Intra-op Plan:   Post-operative Plan:   Informed Consent: I have reviewed the patients History and Physical, chart, labs and discussed the procedure including the risks, benefits and alternatives for the proposed anesthesia with the patient or authorized representative who has indicated his/her understanding and acceptance.     Plan Discussed with:   Anesthesia Plan Comments:         Anesthesia Quick Evaluation

## 2012-01-10 NOTE — Care Management Note (Unsigned)
    Page 1 of 2   01/12/2012     1:35:11 PM   CARE MANAGEMENT NOTE 01/12/2012  Patient:  [name] [name], [name] [name]   Account Number:  [id number]  Date Initiated:  01/10/2012  Documentation initiated by:  Sharrie Rothman  Subjective/Objective Assessment:   Pt admitted from home with fracture of left ankle. Pt is s/p ORIF. Pt lives with husband and would like to return home at discharge.     Action/Plan:   PT to assess pt on 01/11/12. CM is available to arrange [facility] and DME if pt is able to return home. If pt needs SNF, CSW will be consulted and a bed search will be initiated.   Anticipated DC Date:  01/12/2012   Anticipated DC Plan:  HOME W HOME HEALTH SERVICES      DC Planning Services  CM consult      [facility] Choice  HOME HEALTH  DURABLE MEDICAL EQUIPMENT   Choice offered to / List presented to:  C-4 Adult Children   DME arranged  3-N-1      DME agency  [facility]     HH arranged  HH-2 PT      [facility] agency  [facility]   Status of service:  In process, will continue to follow Medicare Important Message given?   (If response is "NO", the following Medicare IM given date fields will be blank) Date Medicare IM given:   Date Additional Medicare IM given:    Discharge Disposition:    Per UR Regulation:    If discussed at Long Length of Stay Meetings, dates discussed:    Comments:  01/12/12 1100 [name] RN Pt to return home with family. HH discussed and setup up 01/10/12 1332 [name], Charity fundraiser BSN CM

## 2012-01-10 NOTE — Anesthesia Postprocedure Evaluation (Signed)
  Anesthesia Post-op Note  Patient: [name] [name]  Procedure(s) Performed: Procedure(s) (LRB): OPEN REDUCTION INTERNAL FIXATION (ORIF) ANKLE FRACTURE (Left)  Patient Location: PACU  Anesthesia Type: Spinal  Level of Consciousness: awake, alert , oriented and patient cooperative  Airway and Oxygen Therapy: Patient Spontanous Breathing and Patient connected to nasal cannula oxygen  Post-op Pain: mild  Post-op Assessment: Post-op Vital signs reviewed, Patient's Cardiovascular Status Stable, Respiratory Function Stable and Patent Airway  Post-op Vital Signs: Reviewed and stable  Complications: No apparent anesthesia complications

## 2012-01-10 NOTE — Preoperative (Signed)
Beta Blockers   Reason not to administer Beta Blockers:Not Applicable 

## 2012-01-10 NOTE — Brief Op Note (Signed)
01/08/2012 - 01/10/2012  10:10 AM  PATIENT:  [name] [name]  66 y.o. female  PRE-OPERATIVE DIAGNOSIS:  trimalleolar left  ankle fracture  POST-OPERATIVE DIAGNOSIS:  trimalleolar left  ankle fracture  PROCEDURE:  Procedure(s) (LRB): OPEN REDUCTION INTERNAL FIXATION (ORIF) ANKLE FRACTURE (Left) prone, fixation of medial, lateral and posterior malleolus  SURGEON:  Surgeon(s) and Role:    * Darreld Mclean, MD - Primary  PHYSICIAN ASSISTANT:   ASSISTANTS: none   ANESTHESIA:   spinal  EBL:  Total I/O In: 1000 [I.V.:1000] Out: -   BLOOD ADMINISTERED:none  DRAINS: none   LOCAL MEDICATIONS USED:  NONE  SPECIMEN:  No Specimen  DISPOSITION OF SPECIMEN:  N/A  COUNTS:  YES  TOURNIQUET:  77 minutes  DICTATION: .Other Dictation: Dictation Number [id number]  PLAN OF CARE: Admit to inpatient   PATIENT DISPOSITION:  PACU - hemodynamically stable.   Delay start of Pharmacological VTE agent (>24hrs) due to surgical blood loss or risk of bleeding: no

## 2012-01-10 NOTE — Transfer of Care (Signed)
Immediate Anesthesia Transfer of Care Note  Patient: [name] [name]  Procedure(s) Performed: Procedure(s) (LRB): OPEN REDUCTION INTERNAL FIXATION (ORIF) ANKLE FRACTURE (Left)  Patient Location: PACU  Anesthesia Type: Spinal  Level of Consciousness: awake, alert , oriented and patient cooperative  Airway & Oxygen Therapy: Patient Spontanous Breathing and Patient connected to face mask oxygen  Post-op Assessment: Report given to PACU RN and Post -op Vital signs reviewed and stable  Post vital signs: Reviewed and stable  Complications: No apparent anesthesia complications

## 2012-01-11 ENCOUNTER — Encounter (HOSPITAL_COMMUNITY): Payer: Self-pay | Admitting: Orthopaedic Surgery

## 2012-01-11 ENCOUNTER — Telehealth: Payer: Self-pay | Admitting: Orthopedic Surgery

## 2012-01-11 LAB — CBC WITH DIFFERENTIAL/PLATELET
Basophils Absolute: 0 10*3/uL (ref 0.0–0.1)
HCT: 30.7 % — ABNORMAL LOW (ref 36.0–46.0)
Hemoglobin: 9.9 g/dL — ABNORMAL LOW (ref 12.0–15.0)
Lymphocytes Relative: 18 % (ref 12–46)
Lymphs Abs: 1 10*3/uL (ref 0.7–4.0)
Monocytes Absolute: 0.8 10*3/uL (ref 0.1–1.0)
Neutro Abs: 4 10*3/uL (ref 1.7–7.7)
RBC: 3.27 MIL/uL — ABNORMAL LOW (ref 3.87–5.11)
RDW: 13.5 % (ref 11.5–15.5)
WBC: 5.9 10*3/uL (ref 4.0–10.5)

## 2012-01-11 LAB — BASIC METABOLIC PANEL
CO2: 31 mEq/L (ref 19–32)
Chloride: 101 mEq/L (ref 96–112)
Creatinine, Ser: 0.54 mg/dL (ref 0.50–1.10)
GFR calc Af Amer: >90 mL/min (ref >90)
Potassium: 3.3 mEq/L — ABNORMAL LOW (ref 3.5–5.1)
Sodium: 140 mEq/L (ref 135–145)

## 2012-01-11 MED ORDER — SODIUM CHLORIDE 0.9 % IJ SOLN
INTRAMUSCULAR | Status: AC
  Administered 2012-01-11: 10 mL
  Filled 2012-01-11: qty 3

## 2012-01-11 NOTE — Telephone Encounter (Signed)
Debra/Dr. Sanjuan Dame office called and wanted me to remind you to check on Dr. Sanjuan Dame patient, [name] [name] in room 340 at [facility]. Said he had left you a message on your cell phone.

## 2012-01-11 NOTE — Progress Notes (Signed)
Subjective: 1 Day Post-Op Procedure(s) (LRB): OPEN REDUCTION INTERNAL FIXATION (ORIF) ANKLE FRACTURE (Left) Patient reports pain as 4 on 0-10 scale.    Objective: Vital signs in last 24 hours: Temp:  [97.6 F (36.4 C)-98.6 F (37 C)] 98.6 F (37 C) (07/03 0631) Pulse Rate:  [85-112] 85  (07/03 0631) Resp:  [11-22] 18  (07/03 0631) BP: (103-147)/(63-97) 108/67 mmHg (07/03 0631) SpO2:  [95 %-100 %] 96 % (07/03 0631)  Intake/Output from previous day: 07/02 0701 - 07/03 0700 In: 1200 [I.V.:1200] Out: 20 [Blood:20] Intake/Output this shift:     Basename 01/11/12 0449 01/08/12 2147  HGB 9.9* 10.5*    Basename 01/11/12 0449 01/08/12 2147  WBC 5.9 6.2  RBC 3.27* 3.51*  HCT 30.7* 32.2*  PLT 219 270    Basename 01/11/12 0449 01/08/12 2147  NA 140 139  K 3.3* 3.9  CL 101 104  CO2 31 25  BUN 6 13  CREATININE 0.54 0.74  GLUCOSE 130* 110*  CALCIUM 9.0 9.0    Basename 01/08/12 2147  LABPT --  INR 0.89    Neurologically intact Neurovascular intact Sensation intact distally Intact pulses distally  Assessment/Plan: 1 Day Post-Op Procedure(s) (LRB): OPEN REDUCTION INTERNAL FIXATION (ORIF) ANKLE FRACTURE (Left) Up with therapy  I will be out of town until July 8th.  Dr. Romeo Apple will cover for me.  She wants to go home and will need home health.  [name] [name] 01/11/2012, 7:22 AM

## 2012-01-11 NOTE — Addendum Note (Signed)
Addendum  created 01/11/12 0839 by Franco Nones, CRNA   Modules edited:Notes Section

## 2012-01-11 NOTE — Anesthesia Postprocedure Evaluation (Signed)
Anesthesia Post Note  Patient: [name] [name]  Procedure(s) Performed: Procedure(s) (LRB): OPEN REDUCTION INTERNAL FIXATION (ORIF) ANKLE FRACTURE (Left)  Anesthesia type: Spinal  Patient location: 340  Post pain: Pain level controlled  Post assessment: Post-op Vital signs reviewed, Patient's Cardiovascular Status Stable, Respiratory Function Stable, Patent Airway, No signs of Nausea or vomiting and Pain level controlled  Last Vitals:  Filed Vitals:   01/11/12 0631  BP: 108/67  Pulse: 85  Temp: 37 C  Resp: 18    Post vital signs: Reviewed and stable  Level of consciousness: awake and alert   Complications: No apparent anesthesia complications

## 2012-01-11 NOTE — Progress Notes (Signed)
Physical Therapy Treatment Patient Details Name: [name] [name] MRN: [id number] DOB: January 14, 1946 Today's Date: 01/11/2012 Time: 7829-5621 PT Time Calculation (min): 31 min  PT Assessment / Plan / Recommendation Comments on Treatment Session  Patient still in a lot of pain with movement and somewhat confused/drowsy form pain meds; gait training with RW attempted but not safe at this time. Patient was able to complete 4 sit<>stands with Min A needing verbal cues for leaning forward and hand placement. SPT to bed with RW required Mod A due to fatigue. Plan on gait training next session if appropriate    Follow Up Recommendations  No PT follow up    Barriers to Discharge   none    Equipment Recommendations  Rolling walker with 5" wheels (once pt is not as lethargic she may do well with crutches.)    Recommendations for Other Services    Frequency Min 5X/week   Plan      Precautions / Restrictions Precautions Precautions: Fall Restrictions Weight Bearing Restrictions: Yes LLE Weight Bearing: Touchdown weight bearing   Pertinent Vitals/Pain     Mobility  Bed Mobility Bed Mobility: Not assessed;Sit to Supine Supine to Sit: 3: Mod assist Details for Bed Mobility Assistance: assistance with LEs Transfers Transfers: Sit to Stand;Stand to Sit Sit to Stand: 4: Min assist Stand to Sit: 4: Min guard Stand Pivot Transfers: 3: Mod assist Details for Transfer Assistance: verbal cues for hand placement and backing to surface before sitting Ambulation/Gait Ambulation/Gait Assistance: Not tested (comment) (pt still too drowsy/confused) Assistive device: Rolling walker    Exercises General Exercises - Lower Extremity Quad Sets: Both;10 reps Gluteal Sets: 10 reps Short Arc Quad: Left;10 reps;AAROM Heel Slides: Right;10 reps Hip ABduction/ADduction: Both;10 reps (AAROM LLE) Other Exercises Other Exercises: sit<>stand x4; patient could not complete 5th rep due to fatigue   PT Diagnosis:  Difficulty walking;Acute pain  PT Problem List: Decreased mobility;Decreased knowledge of use of DME;Decreased knowledge of precautions;Decreased safety awareness PT Treatment Interventions: Gait training;Stair training;Functional mobility training;Patient/family education   PT Goals Acute Rehab PT Goals PT Goal Formulation: With patient Time For Goal Achievement: 01/13/12 Potential to Achieve Goals: Good Pt will go Supine/Side to Sit: with modified independence PT Goal: Supine/Side to Sit - Progress: Goal set today Pt will go Sit to Stand: with modified independence PT Goal: Sit to Stand - Progress: Progressing toward goal Pt will go Stand to Sit: with modified independence PT Goal: Stand to Sit - Progress: Progressing toward goal Pt will Transfer Bed to Chair/Chair to Bed: with modified independence PT Transfer Goal: Bed to Chair/Chair to Bed - Progress: Progressing toward goal Pt will Ambulate: 16 - 50 feet;with rolling walker;with supervision PT Goal: Ambulate - Progress: Goal set today Pt will Go Up / Down Stairs: 3-5 stairs;with mod assist;with rail(s) PT Goal: Up/Down Stairs - Progress: Goal set today  Visit Information  Last PT Received On: 01/11/12 Assistance Needed: +1    Subjective Data  Subjective: Pt states she has three steps to go up into her house Patient Stated Goal: To go home   Cognition  Overall Cognitive Status: Appears within functional limits for tasks assessed/performed Arousal/Alertness: Awake/alert Orientation Level: Appears intact for tasks assessed Behavior During Session: [facility] for tasks performed    Balance     End of Session PT - End of Session Equipment Utilized During Treatment: Gait belt Activity Tolerance: Patient tolerated treatment well;Patient limited by fatigue;Patient limited by pain Patient left: in bed;with family/visitor present;with nursing in room  GP     Lajuanna Pompa ATKINSO 01/11/2012, 1:43 PM

## 2012-01-11 NOTE — Op Note (Signed)
NAMECORTNE, AMARA                ACCOUNT NO.:  [id number]  MEDICAL RECORD NO.:  [id number]  LOCATION:                                 FACILITY:  PHYSICIAN:  J. Darreld Mclean, M.D. DATE OF BIRTH:  1946-01-23  DATE OF PROCEDURE:  01/10/2012 DATE OF DISCHARGE:                              OPERATIVE REPORT   PREOPERATIVE DIAGNOSIS:  Trimalleolar fracture of the left ankle with displacement of the posterior malleolus as well.  PROCEDURE PERFORMED:  Open treatment internal reduction of the left ankle try malleolar fracture, occurring both at the medial and lateral malleolus and also of the posterior malleolus.  Procedures done in the prone position.  DRAINS:  No drains.  A posterior splint applied.  ANESTHESIA:  Spinal.  SURGEON:  J. Darreld Mclean, M.D.  TOURNIQUET TIME:  77 minutes.  INDICATIONS:  The patient fell on the wet grass in her home 2 days ago and sustained severe trimalleolar fracture and dislocation of her right ankle, reduced in the ER, but she has had an unstable fracture.  The patient was admitted by Dr. Romeo Apple.  Family requested me.  I saw the patient yesterday after return from out of town, placed on the schedule today.  She has a very significant fracture and I want to talk to the family about the risks and imponderables including infection, embolization, postop arthritis, which could be pronounced over time, need for physical therapy, home health, and anesthesia risk among others.  I have recommended spinal anesthesia.  Family asked appropriate questions, appeared to understand the procedure as outlined.  PROCEDURE:  The patient was seen in the holding area, and the left ankle identified as correct surgical site.  I placed a mark on the left foot. She was brought back to the operating room, given spinal anesthesia and then placed in the prone position.  Tourniquet placed, deflated left upper thigh.  With Radiology time-out, I brought the x-ray machine  and the C-arm so that I could visualize well and get the proper position before we do the actual prep and drape.  We had a radiology time-out before using the C-arm machine.  Everyone had on the surgical lead apron, lead thyroid shield, identification badge, and the machine was working properly.  Any one that walked in the room later and had to have x-ray lead apron on.  Proper positioning was obtained and good visualization of the fracture was obtained using the C-arm.  The C-arm was then removed, and the patient was prepped and draped in usual manner.  We had a time-out identifying the patient, Ms. Melick for doing a left ankle fracture, trimalleolar fracture.  All instrumentation properly positioned and working.  OR team knew each other.  The patient received preoperative antibiotics.  The leg was then elevated, wrapped circumferentially with Esmarch bandage.  Tourniquet inflated to 300 mmHg.  Esmarch bandage removed.  Posterior incision was made in the medial aspect of the Achilles tendon, and the fracture was identified and reduced and held in place first with _________ and then guide pins. Guide pins were placed and then using cannulated screw system, 36 mm cannulated screws were inserted, reduction of posterior malleolus C-arm  fluoroscopy unit was used to ascertain this.  Attention was then directed to the medial side, the medial malleolus.  Incision was made. The fracture was anatomically reduced, held in place with a smooth Kirschner wire and then a 3.2 drill bit was used and a 55 mm long, 4.5 mm malleolar screw was inserted.  X-ray showed good position and alignment, reduction of medial malleolus fracture.  Attention was directed to the lateral side using a 5-hole plate.  Fracture was anatomically held in place and reduced and a locking screw was also used, 5 screws were used.  These measured from 12 mm to 18 mm. Permanent x-rays were taken.  The wounds were then reapproximated  each one with 2-0 chromic, 2-0 plain, and skin staples.  Sterile bulky dressings were applied.  Extra padding was applied and then a posterior plaster splint was applied.  Tourniquet was deflated for placement of posterior splint.  Total tourniquet time was 77 minutes.  The patient tolerated procedure well, went to recovery in good condition.  We will arrange physical therapy tomorrow.          ______________________________ Shela Commons. Darreld Mclean, M.D.     JWK/MEDQ  D:  01/10/2012  T:  01/10/2012  Job:  [id number]

## 2012-01-11 NOTE — Evaluation (Signed)
Physical Therapy Evaluation Patient Details Name: [name] [name] MRN: [id number] DOB: 1946-01-03 Today's Date: 01/11/2012 Time: 1015-1055 PT Time Calculation (min): 40 min  PT Assessment / Plan / Recommendation Clinical Impression  Pt s/p ankle fracture who is to stay TTWB did well in threrapy but is very drowsy secondary to taking pain medication.  Pt will benefit from skilled care to improve I and safety with ambulation.  Anticipate pt to progress well with therapy.    PT Assessment  Patient needs continued PT services    Follow Up Recommendations  No PT follow up    Barriers to Discharge   none    Equipment Recommendations  Rolling walker with 5" wheels (once pt is not as lethargic she may do well with crutches.)    Recommendations for Other Services   OT  Frequency Min 5X/week    Precautions / Restrictions Precautions Precautions: Fall Restrictions Weight Bearing Restrictions: Yes LLE Weight Bearing: Touchdown weight bearing         Mobility  Bed Mobility Bed Mobility: Supine to Sit Supine to Sit: 4: Min assist Transfers Transfers: Sit to Stand;Stand to Sit;Stand Pivot Transfers Sit to Stand: 4: Min assist Stand to Sit: 4: Min Multimedia programmer Transfers: 4: Min assist Ambulation/Gait Ambulation/Gait Assistance: Not tested (comment) (Pt to drowsy) Assistive device: Rolling walker    Exercises     PT Diagnosis: Difficulty walking;Acute pain  PT Problem List: Decreased mobility;Decreased knowledge of use of DME;Decreased knowledge of precautions;Decreased safety awareness PT Treatment Interventions: Gait training;Stair training;Functional mobility training;Patient/family education   PT Goals Acute Rehab PT Goals PT Goal Formulation: With patient Time For Goal Achievement: 01/13/12 Potential to Achieve Goals: Good Pt will go Supine/Side to Sit: with modified independence PT Goal: Supine/Side to Sit - Progress: Goal set today Pt will go Sit to Stand: with  modified independence PT Goal: Sit to Stand - Progress: Goal set today Pt will go Stand to Sit: with modified independence PT Goal: Stand to Sit - Progress: Goal set today Pt will Transfer Bed to Chair/Chair to Bed: with modified independence PT Transfer Goal: Bed to Chair/Chair to Bed - Progress: Goal set today Pt will Ambulate: 16 - 50 feet;with rolling walker;with supervision PT Goal: Ambulate - Progress: Goal set today Pt will Go Up / Down Stairs: 3-5 stairs;with mod assist;with rail(s) PT Goal: Up/Down Stairs - Progress: Goal set today  Visit Information  Last PT Received On: 01/11/12 Assistance Needed: +1    Subjective Data  Subjective: Pt states she has three steps to go up into her house Patient Stated Goal: To go home   Prior Functioning  Home Living Lives With: Family Available Help at Discharge: Family;Friend(s) Type of Home: House Home Access: Stairs to enter Secretary/administrator of Steps: 3 Entrance Stairs-Rails: Right Home Layout: One level Bathroom Shower/Tub: Engineer, manufacturing systems: Standard Home Adaptive Equipment: None Prior Function Level of Independence: Independent Able to Take Stairs?: Reciprically Driving: Yes Communication Communication: No difficulties    Cognition  Overall Cognitive Status: Appears within functional limits for tasks assessed/performed Arousal/Alertness: Awake/alert Orientation Level: Appears intact for tasks assessed Behavior During Session: [facility] for tasks performed    Extremity/Trunk Assessment Right Lower Extremity Assessment RLE ROM/Strength/Tone: [facility] for tasks assessed Left Lower Extremity Assessment LLE ROM/Strength/Tone: Deficits;Unable to fully assess LLE ROM/Strength/Tone Deficits: ankle casted; hip appears wfl   Balance    End of Session PT - End of Session Equipment Utilized During Treatment: Gait belt Activity Tolerance: Treatment limited  secondary to medication Patient left: in chair;with call  bell/phone within reach;with family/visitor present  GP     [name] [name],CINDY 01/11/2012, 11:02 AM

## 2012-01-12 LAB — BASIC METABOLIC PANEL
CO2: 31 mEq/L (ref 19–32)
Chloride: 98 mEq/L (ref 96–112)
Creatinine, Ser: 0.58 mg/dL (ref 0.50–1.10)
Glucose, Bld: 129 mg/dL — ABNORMAL HIGH (ref 70–99)

## 2012-01-12 LAB — CBC WITH DIFFERENTIAL/PLATELET
Eosinophils Relative: 3 % (ref 0–5)
HCT: 28.7 % — ABNORMAL LOW (ref 36.0–46.0)
Lymphocytes Relative: 14 % (ref 12–46)
Lymphs Abs: 0.9 10*3/uL (ref 0.7–4.0)
MCV: 93.8 fL (ref 78.0–100.0)
Monocytes Absolute: 0.7 10*3/uL (ref 0.1–1.0)
RBC: 3.06 MIL/uL — ABNORMAL LOW (ref 3.87–5.11)
RDW: 13.6 % (ref 11.5–15.5)
WBC: 6.6 10*3/uL (ref 4.0–10.5)

## 2012-01-12 MED ORDER — OXYCODONE-ACETAMINOPHEN 5-325 MG PO TABS
1.0000 | ORAL_TABLET | ORAL | Status: DC
  Administered 2012-01-12 – 2012-01-13 (×6): 1 via ORAL
  Filled 2012-01-12 (×7): qty 1

## 2012-01-12 NOTE — Progress Notes (Signed)
Physical Therapy Treatment Patient Details Name: [name] [name] MRN: [id number] DOB: September 25, 1945 Today's Date: 01/12/2012 Time: 1027-1100 PT Time Calculation (min): 33 min  PT Assessment / Plan / Recommendation Comments on Treatment Session  Patient still experiencing lots of pain with movement of LLE;however today able to complete 10' of gait training with RW bed<>recliner;Min A, assistance with guiding RW at times and patient fatigued quickly. Patient states she has 5 steps to enter house;stair training next session if appropriate    Follow Up Recommendations       Barriers to Discharge        Equipment Recommendations       Recommendations for Other Services    Frequency     Plan      Precautions / Restrictions     Pertinent Vitals/Pain     Mobility  Bed Mobility Supine to Sit: 3: Mod assist Details for Bed Mobility Assistance: assistance with LLE and trunk Transfers Sit to Stand: 4: Min assist;From elevated surface;With upper extremity assist Stand to Sit: 4: Min guard Details for Transfer Assistance: verbal cues for hand placement Ambulation/Gait Ambulation/Gait Assistance: 4: Min assist Ambulation Distance (Feet): 10 Feet Assistive device: Rolling walker Ambulation/Gait Assistance Details: verbal and tactile assistance required for sequencing and movement of RW at times Gait Pattern: Antalgic Gait velocity: slow Stairs: No Wheelchair Mobility Wheelchair Mobility: No    Exercises General Exercises - Lower Extremity Ankle Circles/Pumps: Both;10 reps Quad Sets: Both;10 reps Gluteal Sets: 10 reps Short Arc Quad: 10 reps;Both Heel Slides: Right;10 reps Hip ABduction/ADduction: 10 reps;Both;AAROM Straight Leg Raises: 10 reps;Both;AAROM (AAROM LLE)   PT Diagnosis:    PT Problem List:   PT Treatment Interventions:     PT Goals Acute Rehab PT Goals PT Goal: Supine/Side to Sit - Progress: Progressing toward goal PT Goal: Sit to Stand - Progress: Progressing  toward goal PT Goal: Stand to Sit - Progress: Progressing toward goal PT Goal: Ambulate - Progress: Progressing toward goal PT Goal: Up/Down Stairs - Progress: Not met  Visit Information  Last PT Received On: 01/12/12    Subjective Data      Cognition       Balance     End of Session PT - End of Session Equipment Utilized During Treatment: Gait belt Activity Tolerance: Patient tolerated treatment well;Patient limited by fatigue Patient left: in chair;with call bell/phone within reach;with chair alarm set Nurse Communication: Mobility status   GP     [name] Taketa ATKINSO 01/12/2012, 11:12 AM

## 2012-01-12 NOTE — Progress Notes (Signed)
Subjective: 2 Days Post-Op Procedure(s) (LRB): OPEN REDUCTION INTERNAL FIXATION (ORIF) ANKLE FRACTURE (Left) Patient reports pain as moderate.    Objective: Vital signs in last 24 hours: Temp:  [98 F (36.7 C)-98.9 F (37.2 C)] 98 F (36.7 C) (07/04 0611) Pulse Rate:  [79-87] 79  (07/04 0611) Resp:  [16-18] 18  (07/04 0611) BP: (98-118)/(63-71) 98/63 mmHg (07/04 0611) SpO2:  [93 %-98 %] 95 % (07/04 0707)  Intake/Output from previous day: 07/03 0701 - 07/04 0700 In: 1170 [P.O.:1060; I.V.:10; IV Piggyback:100] Out: -  Intake/Output this shift:     Basename 01/12/12 0625 01/11/12 0449  HGB 9.2* 9.9*    Basename 01/12/12 0625 01/11/12 0449  WBC 6.6 5.9  RBC 3.06* 3.27*  HCT 28.7* 30.7*  PLT 221 219    Basename 01/12/12 0625 01/11/12 0449  NA 137 140  K 3.7 3.3*  CL 98 101  CO2 31 31  BUN 7 6  CREATININE 0.58 0.54  GLUCOSE 129* 130*  CALCIUM 9.0 9.0   No results found for this basename: LABPT:2,INR:2 in the last 72 hours  Neurologically intact Neurovascular intact Sensation intact distally Intact pulses distally Dorsiflexion/Plantar flexion intact Compartment soft  Assessment/Plan: 2 Days Post-Op Procedure(s) (LRB): OPEN REDUCTION INTERNAL FIXATION (ORIF) ANKLE FRACTURE (Left) Up with therapy  Fuller Canada 01/12/2012, 10:35 AM

## 2012-01-13 LAB — BASIC METABOLIC PANEL
BUN: 6 mg/dL (ref 6–23)
CO2: 32 mEq/L (ref 19–32)
Calcium: 9 mg/dL (ref 8.4–10.5)
Chloride: 101 mEq/L (ref 96–112)
Creatinine, Ser: 0.56 mg/dL (ref 0.50–1.10)
GFR calc Af Amer: >90 mL/min (ref >90)
GFR calc non Af Amer: >90 mL/min (ref >90)
Glucose, Bld: 123 mg/dL — ABNORMAL HIGH (ref 70–99)
Potassium: 3.6 mEq/L (ref 3.5–5.1)
Sodium: 139 mEq/L (ref 135–145)

## 2012-01-13 LAB — GLUCOSE, CAPILLARY

## 2012-01-13 LAB — CBC WITH DIFFERENTIAL/PLATELET
Basophils Absolute: 0 10*3/uL (ref 0.0–0.1)
Basophils Relative: 1 % (ref 0–1)
Eosinophils Relative: 4 % (ref 0–5)
HCT: 27.8 % — ABNORMAL LOW (ref 36.0–46.0)
Lymphocytes Relative: 14 % (ref 12–46)
MCHC: 32.4 g/dL (ref 30.0–36.0)
MCV: 92.7 fL (ref 78.0–100.0)
Monocytes Absolute: 0.7 10*3/uL (ref 0.1–1.0)
RDW: 13.4 % (ref 11.5–15.5)

## 2012-01-13 MED ORDER — WARFARIN SODIUM 2.5 MG PO TABS: 2.5000 mg | ORAL_TABLET | Freq: Every day | ORAL | Status: DC

## 2012-01-13 MED ORDER — OXYCODONE-ACETAMINOPHEN 5-325 MG PO TABS: 1.0000 | ORAL_TABLET | ORAL | Status: AC

## 2012-01-13 NOTE — Progress Notes (Signed)
Pt discharged with care notes, instructions and prescriptions.  The patient and family verbalized understanding.  The patient left the floor via w/c with staff in stable condition.  There were no further questions or concerns voiced at this time.

## 2012-01-13 NOTE — Progress Notes (Signed)
Physical Therapy Treatment Patient Details Name: [name] [name] MRN: [id number] DOB: 1946/06/28 Today's Date: 01/13/2012 Time: 7829-5621 PT Time Calculation (min): 40 min  PT Assessment / Plan / Recommendation Comments on Treatment Session  Patient completed stair training today on practice steps in outpatient gym;seated scooting technique taught to patient and daughter. patient required Mod a+2 for assistance with standng from seated positiion. Patient and daughter feel confident and safe with this technigue due to patient still fatiguing quickly. Patient also completed 6' of gait triaining with RW and 4 wheeled RW for W/C<>bed transfer all Min A    Follow Up Recommendations       Barriers to Discharge        Equipment Recommendations       Recommendations for Other Services    Frequency     Plan      Precautions / Restrictions     Pertinent Vitals/Pain     Mobility  Transfers Sit to Stand: 4: Min guard Stand to Sit: 4: Min assist Details for Transfer Assistance: verbal cues still required for back to surface before descending Ambulation/Gait Ambulation/Gait Assistance: 4: Min assist Ambulation Distance (Feet): 6 Feet Assistive device: Rolling walker Gait Pattern: Trunk flexed;Ataxic Gait velocity: slow;labored Stairs: Yes Stairs Assistance: 3: Mod assist Stairs Assistance Details (indicate cue type and reason): verbal and tactile cues for UE and LE placement needed;Mod A +2 requried for assistance with standing from seated positon on step Stair Management Technique: One rail Right;Seated/boosting Number of Stairs: 1     Exercises     PT Diagnosis:    PT Problem List:   PT Treatment Interventions:     PT Goals Acute Rehab PT Goals PT Goal: Sit to Stand - Progress: Progressing toward goal PT Goal: Stand to Sit - Progress: Progressing toward goal PT Transfer Goal: Bed to Chair/Chair to Bed - Progress: Progressing toward goal PT Goal: Ambulate - Progress: Progressing  toward goal PT Goal: Up/Down Stairs - Progress: Met  Visit Information  Last PT Received On: 01/13/12    Subjective Data      Cognition       Balance     End of Session PT - End of Session Equipment Utilized During Treatment: Gait belt Activity Tolerance: Patient tolerated treatment well;Patient limited by fatigue Patient left: in bed;with call bell/phone within reach;with bed alarm set   GP     Chenise Mulvihill ATKINSO 01/13/2012, 11:00 AM

## 2012-01-13 NOTE — Discharge Summary (Addendum)
Physician Discharge Summary  Patient ID: [name] [name] MRN: [id number] DOB/AGE: 12-Oct-1945 66 y.o.  Admit date: 01/08/2012 Discharge date: 01/13/2012  Admission Diagnoses: left ankle trimalleolar fracture   Discharge Diagnoses: same   Admitted by Dr Romeo Apple   D/C by Dr Romeo Apple   Surgery by Dr Hilda Lias    Principal Problem:  *Fracture of ankle, trimalleolar, left, closed   Discharged Condition: good  Hospital Course: The patient's hospital course was generally unremarkable. She came to the emergency room and had a closed reduction of a displaced/dislocated trimalleolar ankle fracture. She was admitted on June 30 by Dr. Romeo Apple but requested Dr. Hilda Lias and had surgery on July 2nd  undergoing open treatment internal fixation of the left ankle for trimalleolar fracture. In the postoperative period she underwent physical therapy and tolerated this reasonably well. She requested to be discharged to home instead of rehabilitation.  Consults: None  Significant Diagnostic Studies: labs:  CBC    Component Value Date/Time   WBC 5.9 01/13/2012 0537   RBC 3.00* 01/13/2012 0537   HGB 9.0* 01/13/2012 0537   HCT 27.8* 01/13/2012 0537   PLT 253 01/13/2012 0537   MCV 92.7 01/13/2012 0537   MCH 30.0 01/13/2012 0537   MCHC 32.4 01/13/2012 0537   RDW 13.4 01/13/2012 0537   LYMPHSABS 0.8 01/13/2012 0537   MONOABS 0.7 01/13/2012 0537   EOSABS 0.2 01/13/2012 0537   BASOSABS 0.0 01/13/2012 0537      BMET    Component Value Date/Time   NA 139 01/13/2012 0537   K 3.6 01/13/2012 0537   CL 101 01/13/2012 0537   CO2 32 01/13/2012 0537   GLUCOSE 123* 01/13/2012 0537   BUN 6 01/13/2012 0537   CREATININE 0.56 01/13/2012 0537   CREATININE 0.75 11/16/2011 1133   CALCIUM 9.0 01/13/2012 0537   GFRNONAA >90 01/13/2012 0537   GFRAA >90 01/13/2012 0537     Treatments: surgery: Open treatment internal fixation left ankle for trimalleolar fracture by Dr. Hilda Lias on July 2  Discharge Exam: Blood pressure 109/69, pulse 77, temperature  97.7 F (36.5 C), temperature source Oral, resp. rate 18, height 5\' 6"  (1.676 m), weight 207 lb 7.3 oz (94.1 kg), SpO2 92.00%. General appearance: alert, cooperative, no distress and Minimal swelling in her foot no tenseness noted in the toes or the foot. Splint intact  Disposition: 01-Home or Self Care  Discharge Orders    Future Appointments: Provider: Department: Dept Phone: Center:   01/18/2012 1:45 PM Sherrie George, MD [facility] [phone] None     Future Orders Please Complete By Expires   Diet - low sodium heart healthy      Increase activity slowly      Discharge instructions      Comments:   Elevate foot when not walking   Continue weight bearing status as in the hospital   No wound care        Medication List  As of 01/13/2012 10:31 AM   STOP taking these medications         acetaminophen 500 MG tablet         TAKE these medications         albuterol 108 (90 BASE) MCG/ACT inhaler   Commonly known as: PROVENTIL HFA;VENTOLIN HFA   Inhale 2 puffs into the lungs every 6 (six) hours as needed. As needed for shortness of breath and coughing.      aspirin EC 81 MG tablet   Take 81 mg by mouth  daily.      buPROPion 150 MG 24 hr tablet   Commonly known as: WELLBUTRIN XL   Take 1 tablet by mouth Daily.      Calcium-Vitamin D-Vitamin K 500-500-40 MG-UNT-MCG Chew   Chew 2 tablets by mouth 2 (two) times daily.      clonazePAM 0.5 MG tablet   Commonly known as: KLONOPIN   Take 0.5 mg by mouth at bedtime as needed. As needed for anxiety.      diclofenac sodium 1 % Gel   Commonly known as: VOLTAREN   Apply 1 application topically daily as needed. As needed for pain.      diltiazem 240 MG 24 hr capsule   Commonly known as: TIAZAC   Take 240 mg by mouth daily.      fluticasone 50 MCG/ACT nasal spray   Commonly known as: FLONASE   Place 2 sprays into the nose daily.      gabapentin 300 MG capsule   Commonly known as: NEURONTIN   Take 300 mg by mouth 3  (three) times daily.      HYOMAX-SL 0.125 MG SL tablet   Generic drug: hyoscyamine   Dissolve 1 to 2 tablets under the tongue every 4 (four) hours as needed for chest pain.      levothyroxine 88 MCG tablet   Commonly known as: SYNTHROID, LEVOTHROID   Take 88 mcg by mouth daily.      losartan 25 MG tablet   Commonly known as: COZAAR   Take 25 mg by mouth daily.      omeprazole 40 MG capsule   Commonly known as: PRILOSEC   Take 40 mg by mouth 2 (two) times daily.      oxyCODONE-acetaminophen 5-325 MG per tablet   Commonly known as: PERCOCET   Take 1 tablet by mouth every 4 (four) hours.      pravastatin 20 MG tablet   Commonly known as: PRAVACHOL   Take 20 mg by mouth daily.      pregabalin 150 MG capsule   Commonly known as: LYRICA   Take 150 mg by mouth 3 (three) times daily.      SUMAtriptan 50 MG tablet   Commonly known as: IMITREX   Take 50 mg by mouth every 2 (two) hours as needed. Take one tablet as needed by mouth at onset of headache as directed.      tetrahydrozoline-zinc 0.05-0.25 % ophthalmic solution   Commonly known as: VISINE-AC   Place 2 drops into both eyes 3 (three) times daily as needed. Dry Eyes      tiZANidine 2 MG tablet   Commonly known as: ZANAFLEX   Take 0.5-1 tablets (1-2 mg total) by mouth every 8 (eight) hours as needed.      venlafaxine XR 150 MG 24 hr capsule   Commonly known as: EFFEXOR-XR   Take 150 mg by mouth 2 (two) times daily.      Vitamin D3 2000 UNITS Tabs   Take 1 tablet by mouth daily.      warfarin 2.5 MG tablet   Commonly known as: COUMADIN   Take 1 tablet (2.5 mg total) by mouth daily.           Follow-up Information    Follow up with Darreld Mclean, MD in 1 week. (call Dr Sanjuan Dame office to get time )    Contact information:   [address] [place] [address] [phone]  The patient is placed on nontherapeutic low-dose warfarin 2.5 mg daily for 14 days.  The patient will  followup with Dr. Hilda Lias in one week. She is discharged on Percocet for pain.  Last PT note July 4th :  LLE;however today able to complete 10' of gait training with RW bed<>recliner;Min A, assistance with guiding RW at times and patient fatigued quickly. Patient states she has 5 steps to enter house;stair training next session if appropriate    Signed: Fuller Canada 01/13/2012, 10:31 AM

## 2012-01-15 ENCOUNTER — Telehealth: Payer: Self-pay | Admitting: Internal Medicine

## 2012-01-15 NOTE — Telephone Encounter (Signed)
Home health agency called around 1 pm about concerns for pt having urinary frequency, urgency and incontinence symptoms per Physical Therapy- when they went to see her.  No N/V, abd pain. Looking in chart, she has this symptoms before. Requested for [facility] to collect Urine for UA/micro and C&S. Results will be forwarded to me and Dr. Meredith Pel- PCP

## 2012-01-16 ENCOUNTER — Encounter: Payer: Medicare Other | Admitting: Physical Therapy

## 2012-01-17 ENCOUNTER — Ambulatory Visit: Payer: Medicare Other | Admitting: Physical Therapy

## 2012-01-18 ENCOUNTER — Telehealth: Payer: Self-pay | Admitting: Internal Medicine

## 2012-01-18 ENCOUNTER — Encounter: Payer: Medicare Other | Admitting: Physical Therapy

## 2012-01-18 ENCOUNTER — Encounter (INDEPENDENT_AMBULATORY_CARE_PROVIDER_SITE_OTHER): Payer: Medicare Other | Admitting: Ophthalmology

## 2012-01-18 DIAGNOSIS — N390 Urinary tract infection, site not specified: Secondary | ICD-10-CM | POA: Insufficient documentation

## 2012-01-18 NOTE — Assessment & Plan Note (Signed)
I received a report today faxed from [facility] of a urine culture done 01/15/2012; this grew greater than 100,000 colonies per mL of Klebsiella pneumoniae and Escherichia coli; both were sensitive to trimethoprim/sulfamethoxazole.   I called patient at home and discussed this with her.  She is having urinary urgency with urge incontinence and mild dysuria; she denies fever, vomiting, or other systemic symptoms.  The plan is to treat for uncomplicated cystitis with Septra DS one tablet twice a day for 3 days.  I advised patient to let me know if her symptoms persist, and to come in right away if she has fever or other worsening symptoms. 

## 2012-01-18 NOTE — Telephone Encounter (Addendum)
I received a report today faxed from [facility] of a urine culture done 01/15/2012; this grew greater than 100,000 colonies per mL of Klebsiella pneumoniae and Escherichia coli; both were sensitive to trimethoprim/sulfamethoxazole.   I called patient at home and discussed this with her.  She is having urinary urgency with urge incontinence and mild dysuria; she denies fever, vomiting, or other systemic symptoms.  The plan is to treat for uncomplicated cystitis with Septra DS one tablet twice a day for 3 days.  I advised patient to let me know if her symptoms persist, and to come in right away if she has fever or other worsening symptoms.

## 2012-01-19 MED ORDER — SULFAMETHOXAZOLE-TRIMETHOPRIM 800-160 MG PO TABS: 1.0000 | ORAL_TABLET | Freq: Two times a day (BID) | ORAL | Status: AC

## 2012-01-19 NOTE — Addendum Note (Signed)
Addended by: Margarito Liner on: 01/19/2012 05:38 PM   Modules accepted: Orders

## 2012-01-19 NOTE — Telephone Encounter (Addendum)
I called in Septra DS one PO BID for 3 days to [facility] in [place] on 01/18/2012.

## 2012-01-30 ENCOUNTER — Telehealth: Payer: Self-pay

## 2012-01-30 NOTE — Telephone Encounter (Signed)
Pt called to let us know she has broken her ankle and will not be able to make her July appt.  Please call if any questions.

## 2012-02-07 ENCOUNTER — Ambulatory Visit: Payer: Medicare Other | Admitting: Physical Medicine & Rehabilitation

## 2012-02-09 NOTE — Telephone Encounter (Signed)
No note from doctor °

## 2012-02-15 ENCOUNTER — Encounter: Payer: Self-pay | Admitting: Internal Medicine

## 2012-03-07 ENCOUNTER — Encounter: Payer: Medicare Other | Admitting: Physical Medicine & Rehabilitation

## 2012-03-13 ENCOUNTER — Telehealth: Payer: Self-pay | Admitting: *Deleted

## 2012-03-13 DIAGNOSIS — N390 Urinary tract infection, site not specified: Secondary | ICD-10-CM

## 2012-03-13 MED ORDER — CIPROFLOXACIN HCL 250 MG PO TABS: 250.0000 mg | ORAL_TABLET | Freq: Two times a day (BID) | ORAL | Status: AC

## 2012-03-13 NOTE — Telephone Encounter (Signed)
I returned call from patient and talked with her about her symptoms.  She is having urinary frequency, urgency and some increased incontinence, similar to her symptoms in July; at that time, her urine culture grew greater than 100,000 colonies per mL of Klebsiella pneumoniae and Escherichia coli, both of which were sensitive to Septra and ciprofloxacin.  I treated her then with 3 days of Septra.  Given her difficulty getting into the clinic, I agreed to treat presumptively for recurrent UTI.  Given her return of symptoms, it would be better to treat with a different antibiotic, and Cipro is the best oral alternative based on prior urine culture sensitivities.  Plan is to treat with Cipro 250 mg twice a day for 3 days.  Patient confirmed that she is no longer on warfarin.  I advised her not to take venlafaxine while taking the ciprofloxacin.  I also discussed with her the potential risk of tendon problems with ciprofloxacin, and advised her that if she develops any such symptoms that she should stop the ciprofloxacin and call me right away.  I also advised her to call if her symptoms persist or worsen.

## 2012-03-13 NOTE — Telephone Encounter (Signed)
Please note that I instructed the patient at the time of my call to take Cipro for 3 days rather than 7 days.

## 2012-03-13 NOTE — Telephone Encounter (Signed)
Spoke w/ dr Meredith Pel, called pt informed her of cipro twice daily x 7 days and if she has N&V, abd/ back pain, pain w/ urination, blood in urine, fevers to call for appt or go to urg care or ED, she is agreeable and will have cipro picked up tomorrow and start

## 2012-03-13 NOTE — Telephone Encounter (Signed)
Pt calls and states she is having frequency, urgency and incont. Of urine recently, denies pain, burning, blood in urine, N&V, fevers. She would like for something to be called in to her pharm. Please advise. i have informed her she may need an appt, she states no, dr Meredith Pel called something in before and it is hard for her to get around due to a broken ankle.

## 2012-03-13 NOTE — Assessment & Plan Note (Signed)
I returned call from patient and talked with her about her symptoms.  She is having urinary frequency, urgency and some increased incontinence, similar to her symptoms in July; at that time, her urine culture grew greater than 100,000 colonies per mL of Klebsiella pneumoniae and Escherichia coli, both of which were sensitive to Septra and ciprofloxacin.  I treated her then with 3 days of Septra.  Given her difficulty getting into the clinic, I agreed to treat presumptively for recurrent UTI.  Given her return of symptoms, it would be better to treat with a different antibiotic, and Cipro is the best oral alternative based on prior urine culture sensitivities.  Plan is to treat with Cipro 250 mg twice a day for 3 days.  Patient confirmed that she is no longer on warfarin.  I advised her not to take venlafaxine while taking the ciprofloxacin.  I also discussed with her the potential risk of tendon problems with ciprofloxacin, and advised her that if she develops any such symptoms that she should stop the ciprofloxacin and call me right away.  I also advised her to call if her symptoms persist or worsen. 

## 2012-03-19 ENCOUNTER — Encounter: Payer: Medicare Other | Attending: Physical Medicine & Rehabilitation | Admitting: Physical Medicine & Rehabilitation

## 2012-03-19 ENCOUNTER — Encounter: Payer: Self-pay | Admitting: Physical Medicine & Rehabilitation

## 2012-03-19 VITALS — BP 150/78 | HR 97 | Resp 16 | Ht 66.0 in | Wt 190.0 lb

## 2012-03-19 DIAGNOSIS — IMO0001 Reserved for inherently not codable concepts without codable children: Secondary | ICD-10-CM | POA: Insufficient documentation

## 2012-03-19 DIAGNOSIS — M75100 Unspecified rotator cuff tear or rupture of unspecified shoulder, not specified as traumatic: Secondary | ICD-10-CM

## 2012-03-19 DIAGNOSIS — G43009 Migraine without aura, not intractable, without status migrainosus: Secondary | ICD-10-CM

## 2012-03-19 DIAGNOSIS — Z8781 Personal history of (healed) traumatic fracture: Secondary | ICD-10-CM | POA: Insufficient documentation

## 2012-03-19 DIAGNOSIS — S82853A Displaced trimalleolar fracture of unspecified lower leg, initial encounter for closed fracture: Secondary | ICD-10-CM

## 2012-03-19 DIAGNOSIS — M67919 Unspecified disorder of synovium and tendon, unspecified shoulder: Secondary | ICD-10-CM | POA: Insufficient documentation

## 2012-03-19 DIAGNOSIS — S82852A Displaced trimalleolar fracture of left lower leg, initial encounter for closed fracture: Secondary | ICD-10-CM

## 2012-03-19 DIAGNOSIS — M19011 Primary osteoarthritis, right shoulder: Secondary | ICD-10-CM

## 2012-03-19 DIAGNOSIS — M19019 Primary osteoarthritis, unspecified shoulder: Secondary | ICD-10-CM | POA: Insufficient documentation

## 2012-03-19 DIAGNOSIS — M545 Low back pain, unspecified: Secondary | ICD-10-CM | POA: Insufficient documentation

## 2012-03-19 DIAGNOSIS — F411 Generalized anxiety disorder: Secondary | ICD-10-CM | POA: Insufficient documentation

## 2012-03-19 DIAGNOSIS — F3289 Other specified depressive episodes: Secondary | ICD-10-CM | POA: Insufficient documentation

## 2012-03-19 DIAGNOSIS — M25519 Pain in unspecified shoulder: Secondary | ICD-10-CM | POA: Insufficient documentation

## 2012-03-19 DIAGNOSIS — M4802 Spinal stenosis, cervical region: Secondary | ICD-10-CM | POA: Insufficient documentation

## 2012-03-19 DIAGNOSIS — F329 Major depressive disorder, single episode, unspecified: Secondary | ICD-10-CM | POA: Insufficient documentation

## 2012-03-19 DIAGNOSIS — H8110 Benign paroxysmal vertigo, unspecified ear: Secondary | ICD-10-CM

## 2012-03-19 DIAGNOSIS — G8929 Other chronic pain: Secondary | ICD-10-CM | POA: Insufficient documentation

## 2012-03-19 DIAGNOSIS — M719 Bursopathy, unspecified: Secondary | ICD-10-CM | POA: Insufficient documentation

## 2012-03-19 MED ORDER — DICLOFENAC SODIUM 1 % TD GEL: 4.0000 g | Freq: Every day | TRANSDERMAL | Status: DC | PRN

## 2012-03-19 MED ORDER — GABAPENTIN 300 MG PO CAPS: 300.0000 mg | ORAL_CAPSULE | Freq: Three times a day (TID) | ORAL | Status: DC

## 2012-03-19 MED ORDER — TIZANIDINE HCL 2 MG PO TABS: 1.0000 mg | ORAL_TABLET | Freq: Three times a day (TID) | ORAL | Status: DC | PRN

## 2012-03-19 NOTE — Patient Instructions (Signed)
TRY VOLTAREN AND ICE TO YOUR RIGHT SHOULDER.   USE YOUR WALKER AS OPPOSED TO YOUR CRUTCHES

## 2012-03-19 NOTE — Progress Notes (Signed)
Subjective:    Patient ID: [name] [name], female    DOB: Dec 25, 1945, 66 y.o.   MRN: [id number]  HPI  [name] [name] is back regarding her chronic pain. Unfortunately she fell on June 30 and suffered a trimalleolar ankle fx. Which required an ORIF. She's finished up home health PT and is walking in a boot currently. Dr. Rob Hickman has been her treating orthopedic surgeon. She is about to start outpt therapies. She is using crutches, walker , and wheelchair to get around. She has been experiencing more right shoulder pain over the last 2 months as she's been more reliant on her arms for balance, weigh-off loading, etc.  She switched back to neurontin, and after some initial intolerance is doing better. She is using zanaflex at night for spasms which is very effective. She hasnt' been using much of the voltaren recently.   Pain Inventory Average Pain 7 Pain Right Now 4 My pain is constant and aching  In the last 24 hours, has pain interfered with the following? General activity 6 Relation with others 5 Enjoyment of life 5 What TIME of day is your pain at its worst? morning Sleep (in general) Fair  Pain is worse with: bending and some activites Pain improves with: rest, heat/ice, therapy/exercise, medication and injections Relief from Meds: 7  Mobility walk without assistance  Function disabled: date disabled 2004 I need assistance with the following:  bathing  Neuro/Psych bladder control problems bowel control problems numbness depression anxiety  Prior Studies Any changes since last visit?  no  Physicians involved in your care Any changes since last visit?  no   Family History  Problem Relation Age of Onset  . Cancer      breast -- grandmother  . Cancer      lung -- uncles (3)  . Leukemia Sister   . Diabetes Father   . Diabetes Mother   . Coronary artery disease Father    History   Social History  . Marital Status: Married    Spouse Name: N/A    Number of Children:  N/A  . Years of Education: N/A   Social History Main Topics  . Smoking status: Never Smoker   . Smokeless tobacco: Never Used  . Alcohol Use: No  . Drug Use: No  . Sexually Active: None   Other Topics Concern  . None   Social History Narrative  . None   Past Surgical History  Procedure Date  . Cholecystectomy   . Abdominal hysterectomy 1984  . Breast surgery   . Cardiac catheterization   . Thyroidectomy   . Neuromas     removed from feet  . Wrist surgery     left  . Orif ankle fracture 01/10/2012    Procedure: OPEN REDUCTION INTERNAL FIXATION (ORIF) ANKLE FRACTURE;  Surgeon: Darreld Mclean, MD;  Location: [facility];  Service: Orthopedics;  Laterality: Left;   Past Medical History  Diagnosis Date  . DM type 2 (diabetes mellitus, type 2)   . TIA (transient ischemic attack)     MRI 2007 no changes.  There is question of ischemia versus a demyelinating process.  . Anxiety   . GERD (gastroesophageal reflux disease)   . HTN (hypertension)   . Hypothyroidism   . Depression   . Allergic rhinitis   . Fibromyalgia   . Hemorrhoids   . Migraine headache   . Chest pain     Catheterization 2004 normal coronary arteries  /  nuclear August 2 007,  no ischemia  . Joint pain     Pains in multiple joints  . Shortness of breath     March, 2012  . Elevated alkaline phosphatase level   . Dysphagia   . Hiatal hernia   . Abdominal pain, RUQ (right upper quadrant)   . Hypercholesteremia   . Hematochezia   . UTI (lower urinary tract infection)   . Cervical radiculopathy   . Rotator cuff syndrome of right shoulder   . Chronic neck pain   . Chronic back pain     R > L  . Chronic shoulder pain, right   . Lumbar radicular pain     R>L legs   BP 150/78  Pulse 97  Resp 16  Ht 5\' 6"  (1.676 m)  Wt 190 lb (86.183 kg)  BMI 30.67 kg/m2  SpO2 96%      Review of Systems  Constitutional: Negative.   HENT: Negative.   Eyes: Negative.   Cardiovascular: Negative.   Gastrointestinal:  Positive for diarrhea and constipation.  Genitourinary: Positive for urgency.  Musculoskeletal: Positive for myalgias and arthralgias.  Skin: Negative.   Neurological: Negative.   Hematological: Negative.   Psychiatric/Behavioral: Negative.        Objective:   Physical Exam  Constitutional: She is oriented to person, place, and time. She appears well-developed and well-nourished.  HENT:  Head: Normocephalic and atraumatic.  Eyes: Conjunctivae and EOM are normal. Pupils are equal, round, and reactive to light.  Neck: Normal range of motion.  Cardiovascular: Normal rate and regular rhythm.  Pulmonary/Chest: Effort normal.  Abdominal: Soft. Bowel sounds are normal.  Musculoskeletal:  Patient has mild right rotator cuff impingement signs. Subacromial bursa area is tender as well. She did have full range of motion however. Apprehension test is positive. She has pain with palpation over the right AC joint. Biceps tendons were mildly tender to palpation but provocation testing was negative.   She is wearing a walking boot over the left ankle/foot and is balancing fairly well.  Left neck has some tenderness with range of motion. Spurling's test is equivocal to positive. She has generalized tenderness over the left thigh in the soft tissue region of her quadriceps.  Multiple tender points are appreciated today throughout.  Neurological: She is alert and oriented to person, place, and time.  Patient has essentially loss over the C7 distribution. The tendon reflexes are diminished at trace to 1+ throughout both upper extremities however. I did not appreciate any gross motor loss in the upper limbs however today. She does have some pain in addition at the shoulders. Cognitively she is intact and at baseline.    Assessment & Plan:   ASSESSMENT:  1. History of fibromyalgia.  2. Chronic low back pain.  3. Rotator cuff syndrome./right shoulder pain. AC joint arthritis. 4. Anxiety and  depression.  5. Cervical stenosis with left C7 radiculopathy  6. Recent fall with left trimalleolar ankle fx.   PLAN:  1. She will continue her current medication regimen.  2. Continue conservative mgt of her cervical issues. NS is following this patient as well.  3. She has transitioned back to neurontin, and this appears to have worked well for her. 4. Continue with Zanaflex for spasms and back pain. This has been effective as well.  For.  5. Advised ice and voltaren gel to her right shoulder/AC joint. She has multiple source of pain for this shoulder including the RTC, labrum, neck, etc, but the [facility] joint appears most problematic  today. Encouraged her to use less of the crutches and more of her walker with gait as well. 6. She will follow up with our PA in one month. All questions were encouraged and answered.

## 2012-03-21 ENCOUNTER — Other Ambulatory Visit: Payer: Self-pay | Admitting: Internal Medicine

## 2012-03-27 ENCOUNTER — Encounter: Payer: Self-pay | Admitting: Internal Medicine

## 2012-03-27 ENCOUNTER — Ambulatory Visit (INDEPENDENT_AMBULATORY_CARE_PROVIDER_SITE_OTHER): Payer: Medicare Other | Admitting: Internal Medicine

## 2012-03-27 VITALS — BP 132/78 | HR 81 | Temp 97.2°F | Wt 195.4 lb

## 2012-03-27 DIAGNOSIS — R32 Unspecified urinary incontinence: Secondary | ICD-10-CM

## 2012-03-27 DIAGNOSIS — I10 Essential (primary) hypertension: Secondary | ICD-10-CM

## 2012-03-27 DIAGNOSIS — E039 Hypothyroidism, unspecified: Secondary | ICD-10-CM

## 2012-03-27 DIAGNOSIS — R350 Frequency of micturition: Secondary | ICD-10-CM

## 2012-03-27 DIAGNOSIS — Z23 Encounter for immunization: Secondary | ICD-10-CM

## 2012-03-27 LAB — POCT URINALYSIS DIPSTICK
Glucose, UA: NEGATIVE
Leukocytes, UA: NEGATIVE
Nitrite, UA: NEGATIVE
Urobilinogen, UA: 0.2

## 2012-03-27 LAB — TSH: TSH: 9.838 u[IU]/mL — ABNORMAL HIGH (ref 0.350–4.500)

## 2012-03-27 MED ORDER — LOSARTAN POTASSIUM 25 MG PO TABS: 50.0000 mg | ORAL_TABLET | Freq: Every day | ORAL | Status: DC

## 2012-03-27 NOTE — Progress Notes (Signed)
HPI:  [name] [name] is a 66 yo woman with PMH of urinary incontinence, hypothyroidism, DM II, anxiety/depression, HTN, frequent UTIs presents today for urinary incontinence x 1 yr in duration but worsen since ankle surgery in June.  No dysuria or burning sensation or increased in frequency.  She states that anything would trigger it especially laughing, coughing, straining.  She had 2 vaginal deliveries.  She has been evaluated by [facility] with Dr. Sherron Monday and had testing done. I reviewed Dr. Mina Marble note from 09/2011: he felt that she has a "mixed stress urge incontinence and that her urge incontinence is the most severe.  She has flow symptoms and very impressive uroflow pattern.  She needs a renal ultrasound for chronic cystitis and urodynamics to eval her flow symptoms and incontinency"; however, she has not done it because due to financial problem.   She has been taking Zanaflex and it helped with her muscle spasm.  Of note, her issues are not acute because it has been going on for years and husband is frustrated because his wife is on too many medications and the side effects from all her meds are likely causing her current symptoms.  We spent >60 minutes discussing her medications.    ROS:    PE: General: alert, well-developed, and cooperative to examination.  Lungs: normal respiratory effort, no accessory muscle use, normal breath sounds, no crackles, and no wheezes. Heart: normal rate, regular rhythm, no murmur, no gallop, and no rub.  Abdomen: soft, non-tender, normal bowel sounds, no distention, no guarding, no rebound tenderness Msk: no joint swelling, no joint warmth, and no redness over joints. Extremities: No cyanosis, clubbing, left ankle in brace/ Neurologic:nonfocal and using crutches   Psych:  + anxious appearing, and + depressed appearing.

## 2012-03-27 NOTE — Assessment & Plan Note (Signed)
Stress and urge incontinence.  I do not think that she has a UTI as her urinalysis is negative today.  She has been followed by [facility]-Dr MacDiarmid.  He performed cystocopy was unremarkable except for mild diffuse erythema of her bladder and did not have carcinoma in situ.  Her bladder scan showed 18ml residual.  Uroflowmetry: she voided 170 ml with an interrupted straining pattern and a max flow of approx 5ml per second.  She is supposed to get a renal ultrasound to evaluate for chronic cystitis but she has not done so due to financial problem.  She stated that she tried Kegel exercise but did not help, unclear if she used the right techniques. I reviewed her medication lists, she is on dialtiazem which is a calcium chanel blocker which could impair muscle contractility. -follow up with urologist -Practice Kegel exercise - patient' info from [employer] was given to patient -UA and urine ctx sent -Stop Dialtiazem (Tiazac) to see if it would somewhat alleviate her symptoms -Follow up with Dr. Meredith Pel in 1 month and need to discuss with Dr. Meredith Pel about reducing her other meds

## 2012-03-27 NOTE — Assessment & Plan Note (Addendum)
She reports symptoms of sweating and her TSH in 11/2011 was 1.93.   -Will repeat her TSH today -Continue current regimen at this time

## 2012-03-27 NOTE — Patient Instructions (Addendum)
Please follow up with your urologist [facility]  I will call you with any abnormal results on your urinalysis Stop Dialtiazem (Tiazac) Increase Losartan to 50mg  one tablet daily Follow up with Dr. Meredith Pel in 1 month

## 2012-03-27 NOTE — Assessment & Plan Note (Signed)
BP is well-controlled. -Stop Dialtiazem (Tiazac) to see if it would somewhat alleviate her urinary incontinence symptom -Increase Losartan to 50mg  one tablet daily -Repeat BP in 1 month

## 2012-03-28 ENCOUNTER — Other Ambulatory Visit: Payer: Self-pay | Admitting: Internal Medicine

## 2012-03-28 LAB — URINALYSIS, ROUTINE W REFLEX MICROSCOPIC
Hgb urine dipstick: NEGATIVE
Leukocytes, UA: NEGATIVE
Nitrite: NEGATIVE
Protein, ur: NEGATIVE mg/dL

## 2012-03-28 MED ORDER — LEVOTHYROXINE SODIUM 112 MCG PO TABS: 112.0000 ug | ORAL_TABLET | Freq: Every day | ORAL | Status: DC

## 2012-04-04 ENCOUNTER — Encounter: Payer: Self-pay | Admitting: Internal Medicine

## 2012-04-04 ENCOUNTER — Ambulatory Visit (INDEPENDENT_AMBULATORY_CARE_PROVIDER_SITE_OTHER): Payer: Medicare Other | Admitting: Internal Medicine

## 2012-04-04 VITALS — BP 120/70 | HR 80 | Temp 98.1°F | Ht 66.0 in | Wt 192.8 lb

## 2012-04-04 DIAGNOSIS — I10 Essential (primary) hypertension: Secondary | ICD-10-CM

## 2012-04-04 DIAGNOSIS — Z79899 Other long term (current) drug therapy: Secondary | ICD-10-CM

## 2012-04-04 DIAGNOSIS — S82852A Displaced trimalleolar fracture of left lower leg, initial encounter for closed fracture: Secondary | ICD-10-CM

## 2012-04-04 DIAGNOSIS — S82853A Displaced trimalleolar fracture of unspecified lower leg, initial encounter for closed fracture: Secondary | ICD-10-CM

## 2012-04-04 DIAGNOSIS — X58XXXA Exposure to other specified factors, initial encounter: Secondary | ICD-10-CM

## 2012-04-04 DIAGNOSIS — E119 Type 2 diabetes mellitus without complications: Secondary | ICD-10-CM

## 2012-04-04 DIAGNOSIS — E039 Hypothyroidism, unspecified: Secondary | ICD-10-CM

## 2012-04-04 DIAGNOSIS — R32 Unspecified urinary incontinence: Secondary | ICD-10-CM

## 2012-04-04 NOTE — Patient Instructions (Signed)
Continue current medications. Follow up with your urologist for evaluation of your urinary incontinence.

## 2012-04-04 NOTE — Assessment & Plan Note (Signed)
Assessment: Patient is status post open reduction internal fixation of a left ankle trimalleolar fracture by Dr. Teola Bradley on 01/10/2012.  She appears to be doing well and is followed by Dr. Hilda Lias.  Plan: Patient will follow up with Dr. Hilda Lias as scheduled.

## 2012-04-04 NOTE — Assessment & Plan Note (Signed)
TSH  Date Value Range Status  03/27/2012 9.838* 0.350 - 4.500 uIU/mL Final   Assessment: Patient's TSH on 03/27/2012 was mildly elevated on levothyroxine 88 mcg daily.  Dr. Anselm Jungling subsequently increased patient's levothyroxine dose to a dose of 112 mcg daily.  Plan: Continue levothyroxine at current dose; recheck TSH in 6-8 weeks.

## 2012-04-04 NOTE — Assessment & Plan Note (Signed)
Assessment: Patient was seen by Dr. Sherron Monday of [facility] On 09/27/2011.  His workup then included uroflowmetry which showed that she voided 170 mL's with an interrupted straining pattern and a maximum flow of approximately 5 mL per second; a bladder scan showed residual of 18 mL; cystoscopy showed mild diffuse erythema of her bladder but was otherwise normal.  His impression was that she has mixed stress urge incontinence and chronic cystitis.  He planned a return visit for renal ultrasound and urodynamic testing, but she has not followed up because of concerns about the cost of her co-pay.  Patient has been treated twice within the past month for urinary tract infection, and her urinalysis and culture last week did not indicate UTI.  Plan: I discussed at length with patient the importance of following up with Dr. Sherron Monday for the completion of her urology evaluation.

## 2012-04-04 NOTE — Assessment & Plan Note (Signed)
Hemoglobin A1C  Date Value Range Status  04/04/2012 5.8   Final     Assessment: Diabetes control: well controlled on diet alone Progress toward goals: at goal Barriers to meeting goals: no barriers identified  Plan: Diabetes treatment: continue dietary management

## 2012-04-04 NOTE — Progress Notes (Signed)
  Subjective:    Patient ID: [name] [name], female    DOB: 1945-08-10, 66 y.o.   MRN: [id number]  HPI Patient returns for followup of her hypertension, hypothyroidism, urinary incontinence,and other chronic medical problems.  She was seen last week on 03/27/2012 here in the clinic by Dr. Anselm Jungling, who stopped her diltiazem because of concerns that it may be contributing to her urinary incontinence.  Patient has so far noted no improvement in her symptoms.  Her incontinence is chronic, but she reports that it became worse following her hospitalization this summer for an ankle fracture.  She has been treated with two courses of antibiotics for urinary tract infection within the past month.She reports that she has been compliant with her medications.  Her ankle fracture is managed by her orthopedic surgeon Dr. Hilda Lias.  She has continued to follow up with the pain clinic here and also with her psychiatrist.  She has not seen her urologist back for the completion of her incontinence workup because of her concerns about the cost of her co-pays.  Review of Systems  Respiratory: Negative for shortness of breath.   Cardiovascular: Negative for chest pain and leg swelling.  Gastrointestinal: Negative for nausea, vomiting and abdominal pain.  Genitourinary: Negative for dysuria.       Objective:   Physical Exam  Constitutional: No distress.  Cardiovascular: Normal rate and regular rhythm.  Exam reveals no gallop and no friction rub.   No murmur heard. Pulmonary/Chest: Effort normal and breath sounds normal. No respiratory distress. She has no wheezes. She has no rales.  Abdominal: Soft. Bowel sounds are normal. She exhibits no distension. There is no tenderness. There is no rebound and no guarding.  Musculoskeletal: She exhibits no edema.       Feet:      Assessment & Plan:

## 2012-04-04 NOTE — Assessment & Plan Note (Signed)
  BP Readings from Last 3 Encounters:  04/04/12 120/70  03/27/12 132/78  03/19/12 150/78    Assessment: Hypertension control: patient's blood pressure is well controlled off of diltiazem and following the increase in her losartan dose to 50 mg daily. Progress toward goals:  at goal Barriers to meeting goals:  no barriers identified  Plan: Hypertension treatment:    Plan is to continue current regimen of losartan 50 mg daily.

## 2012-04-18 ENCOUNTER — Ambulatory Visit: Payer: Medicare Other | Admitting: Physical Medicine and Rehabilitation

## 2012-04-20 ENCOUNTER — Encounter (INDEPENDENT_AMBULATORY_CARE_PROVIDER_SITE_OTHER): Payer: Medicare Other | Admitting: Ophthalmology

## 2012-04-20 DIAGNOSIS — E1139 Type 2 diabetes mellitus with other diabetic ophthalmic complication: Secondary | ICD-10-CM

## 2012-04-20 DIAGNOSIS — H35039 Hypertensive retinopathy, unspecified eye: Secondary | ICD-10-CM

## 2012-04-20 DIAGNOSIS — I10 Essential (primary) hypertension: Secondary | ICD-10-CM

## 2012-04-20 DIAGNOSIS — H43819 Vitreous degeneration, unspecified eye: Secondary | ICD-10-CM

## 2012-04-20 DIAGNOSIS — E1165 Type 2 diabetes mellitus with hyperglycemia: Secondary | ICD-10-CM

## 2012-04-20 DIAGNOSIS — E11319 Type 2 diabetes mellitus with unspecified diabetic retinopathy without macular edema: Secondary | ICD-10-CM

## 2012-04-20 DIAGNOSIS — H2510 Age-related nuclear cataract, unspecified eye: Secondary | ICD-10-CM

## 2012-04-26 ENCOUNTER — Telehealth: Payer: Self-pay | Admitting: *Deleted

## 2012-04-26 NOTE — Telephone Encounter (Signed)
Pt states she has "bouts" of dizziness for quite awhile, recovering from broken ankle and is afraid she may fall from the dizziness. Has been worse last couple of days. appt 10/18 1330 dr Lavena Bullion per Charlynn Grimes.

## 2012-04-26 NOTE — Telephone Encounter (Signed)
Agree with plan 

## 2012-04-27 ENCOUNTER — Ambulatory Visit (INDEPENDENT_AMBULATORY_CARE_PROVIDER_SITE_OTHER): Payer: Medicare Other | Admitting: Radiation Oncology

## 2012-04-27 ENCOUNTER — Encounter: Payer: Self-pay | Admitting: Radiation Oncology

## 2012-04-27 VITALS — BP 128/87 | HR 96 | Temp 98.5°F | Ht 66.0 in | Wt 191.8 lb

## 2012-04-27 DIAGNOSIS — H8110 Benign paroxysmal vertigo, unspecified ear: Secondary | ICD-10-CM

## 2012-04-27 DIAGNOSIS — R42 Dizziness and giddiness: Secondary | ICD-10-CM

## 2012-04-27 DIAGNOSIS — R55 Syncope and collapse: Secondary | ICD-10-CM

## 2012-04-27 LAB — GLUCOSE, CAPILLARY: Glucose-Capillary: 94 mg/dL (ref 70–99)

## 2012-04-28 DIAGNOSIS — R55 Syncope and collapse: Secondary | ICD-10-CM | POA: Insufficient documentation

## 2012-04-28 LAB — BASIC METABOLIC PANEL
CO2: 27 mEq/L (ref 19–32)
Calcium: 9.6 mg/dL (ref 8.4–10.5)
Chloride: 103 mEq/L (ref 96–112)
Sodium: 139 mEq/L (ref 135–145)

## 2012-04-28 MED ORDER — MECLIZINE HCL 25 MG PO TABS: 25.0000 mg | ORAL_TABLET | Freq: Three times a day (TID) | ORAL | Status: DC | PRN

## 2012-04-28 NOTE — Assessment & Plan Note (Addendum)
Patient had lightheadedness in addition to her normal dizziness with BPPV when standing the morning prior to her visit, which sounds more like pre-syncope. Patient mildly orthostatic in the office (systolic 150s -> 161W), but asymptomatic. She likely has some mild hypovolemia from her copious coffee consumption and limited intake of water and other beverages. She was counseled to limit her intake of tea and coffee, and increase the amount of water she drinks. Additionally, she states she takes percocet for pain associated with a relatively remote ankle fracture, as well as taking klonopin regularly for sleep, which also likely contributed to her symptoms. She was instructed to discontinue these medications. To note, patient did have subtle systolic murmur at RSB on exam, which may be worthy of further investigation if symptoms do not resolve (although patient denies any true syncopal episodes). BMET checked given patient's likely mild hypovolemia and leg cramps--Na+, K+ within normal limits.  - inc water consumption  - d/c klonopin, percocet - consider f/u on systolic murmur if symptoms do not resolve

## 2012-04-28 NOTE — Progress Notes (Signed)
  Subjective:    Patient ID: [name] [name], female    DOB: 1946/04/20, 66 y.o.   MRN: [id number]  HPI Comments: Patient presents with complaints of dizziness when turning her head and lying down, which she states are identical to previous episodes of dizziness she has had from her BPPV. She also complains of one episode yesterday morning where she stood up directly from lying down in the morning after awakening from sleep and felt more dizzy than usual. She states that during this episode she felt as if the room was spinning (as she typically does with her BPPV episodes) but additionally felt some mild lightheadedness. She states that she has not had any recent changes in PO intake or in urination, however she does admit that she drinks a fair amount of coffee and that the amount of water she drinks is limited. She also has been having some leg cramps recently. She denies any other complaints.   [name] [name] was previously undergoing vestibular rehabilitation which she states fully resolved her symptoms to the point where she stopped doing the rehabilitation (approximately 1-2 months prior to current visit, per her report). She states that since then her BPPV symptoms returned and have been slowly progressing.     Review of Systems  Constitutional: Negative for fever, chills, fatigue and unexpected weight change.  HENT: Positive for neck pain (chronic).   Respiratory: Negative for chest tightness, shortness of breath and wheezing.   Cardiovascular: Negative for chest pain and leg swelling.  Gastrointestinal: Negative for nausea, vomiting, abdominal pain and diarrhea.  Genitourinary: Negative for dysuria, urgency (incontinent but has less urgency than in past) and hematuria.  Musculoskeletal: Negative for myalgias and arthralgias.  Skin: Negative for rash and wound.  Neurological: Positive for dizziness and light-headedness. Negative for syncope, weakness, numbness and headaches.       Objective:     Physical Exam  Constitutional: She appears well-developed and well-nourished. No distress.  HENT:  Head: Normocephalic and atraumatic.  Eyes: EOM are normal. Pupils are equal, round, and reactive to light.  Neck: Normal range of motion.  Cardiovascular: Normal rate and regular rhythm.   Murmur (2/6 systolic murmur at RSB) heard. Pulmonary/Chest: Effort normal. She has no wheezes. She has no rales.  Abdominal: Soft. She exhibits no distension. There is no tenderness.  Musculoskeletal: Normal range of motion. She exhibits no edema.       L ankle brace. No difficulty with ambulation. Normal gait.           Assessment & Plan:

## 2012-04-28 NOTE — Assessment & Plan Note (Addendum)
Patient's BPPV symptoms have recurred after previously remitting following vestibular rehab. As her symptoms have now returned, she will likely need to reinitiate therapy. - start vestibular rehab

## 2012-05-14 ENCOUNTER — Encounter: Payer: Medicare Other | Attending: Physical Medicine & Rehabilitation | Admitting: Physical Medicine & Rehabilitation

## 2012-05-14 ENCOUNTER — Encounter: Payer: Self-pay | Admitting: Physical Medicine & Rehabilitation

## 2012-05-14 VITALS — BP 141/80 | HR 80 | Resp 14 | Ht 66.0 in | Wt 195.0 lb

## 2012-05-14 DIAGNOSIS — Z8781 Personal history of (healed) traumatic fracture: Secondary | ICD-10-CM | POA: Insufficient documentation

## 2012-05-14 DIAGNOSIS — M19019 Primary osteoarthritis, unspecified shoulder: Secondary | ICD-10-CM | POA: Insufficient documentation

## 2012-05-14 DIAGNOSIS — M7520 Bicipital tendinitis, unspecified shoulder: Secondary | ICD-10-CM | POA: Insufficient documentation

## 2012-05-14 DIAGNOSIS — G8929 Other chronic pain: Secondary | ICD-10-CM | POA: Insufficient documentation

## 2012-05-14 DIAGNOSIS — M545 Low back pain, unspecified: Secondary | ICD-10-CM | POA: Insufficient documentation

## 2012-05-14 DIAGNOSIS — M5412 Radiculopathy, cervical region: Secondary | ICD-10-CM

## 2012-05-14 DIAGNOSIS — M719 Bursopathy, unspecified: Secondary | ICD-10-CM | POA: Insufficient documentation

## 2012-05-14 DIAGNOSIS — IMO0001 Reserved for inherently not codable concepts without codable children: Secondary | ICD-10-CM | POA: Insufficient documentation

## 2012-05-14 DIAGNOSIS — M75100 Unspecified rotator cuff tear or rupture of unspecified shoulder, not specified as traumatic: Secondary | ICD-10-CM

## 2012-05-14 DIAGNOSIS — M67919 Unspecified disorder of synovium and tendon, unspecified shoulder: Secondary | ICD-10-CM | POA: Insufficient documentation

## 2012-05-14 DIAGNOSIS — M25519 Pain in unspecified shoulder: Secondary | ICD-10-CM | POA: Insufficient documentation

## 2012-05-14 DIAGNOSIS — M4802 Spinal stenosis, cervical region: Secondary | ICD-10-CM | POA: Insufficient documentation

## 2012-05-14 DIAGNOSIS — F411 Generalized anxiety disorder: Secondary | ICD-10-CM | POA: Insufficient documentation

## 2012-05-14 DIAGNOSIS — F329 Major depressive disorder, single episode, unspecified: Secondary | ICD-10-CM

## 2012-05-14 DIAGNOSIS — M19011 Primary osteoarthritis, right shoulder: Secondary | ICD-10-CM

## 2012-05-14 DIAGNOSIS — F3289 Other specified depressive episodes: Secondary | ICD-10-CM | POA: Insufficient documentation

## 2012-05-14 MED ORDER — VENLAFAXINE HCL ER 150 MG PO CP24: 150.0000 mg | ORAL_CAPSULE | Freq: Two times a day (BID) | ORAL | Status: DC

## 2012-05-14 NOTE — Progress Notes (Signed)
Subjective:    Patient ID: [name] [name], female    DOB: 26-Feb-1946, 66 y.o.   MRN: [id number]  HPI  Sh[place] is back regarding her chronic pain. She reports that over the last 2 months that her right shoulder has been more painful. It bothers her when she lifts up bags or her grandbaby. She has had general muscle tightness as well. Her bladder specialist recommended that she hold the zanaflex as it might be worsening retention, but when this was stopped her spasms increased dramatically.  She also has a catch in her right 4th and 5th fingers which she notices more in the morning when she first awakens.   Pain Inventory Average Pain 8 Pain Right Now 7 My pain is constant and aching  In the last 24 hours, has pain interfered with the following? General activity 3 Relation with others 4 Enjoyment of life 5 What TIME of day is your pain at its worst? morning day and night Sleep (in general) Fair  Pain is worse with: sitting and standing Pain improves with: rest, medication and injections Relief from Meds: 5  Mobility walk without assistance use a cane how many minutes can you walk? 20 ability to climb steps?  yes do you drive?  no Do you have any goals in this area?  yes  Function disabled: date disabled 2003 Do you have any goals in this area?  no  Neuro/Psych bladder control problems numbness dizziness depression anxiety  Prior Studies Any changes since last visit?  no  Physicians involved in your care Any changes since last visit?  no   Family History  Problem Relation Age of Onset  . Cancer      breast -- grandmother  . Cancer      lung -- uncles (3)  . Leukemia Sister   . Diabetes Father   . Diabetes Mother   . Coronary artery disease Father    History   Social History  . Marital Status: Married    Spouse Name: N/A    Number of Children: N/A  . Years of Education: N/A   Social History Main Topics  . Smoking status: Never Smoker   . Smokeless  tobacco: Never Used  . Alcohol Use: No  . Drug Use: No  . Sexually Active: None   Other Topics Concern  . None   Social History Narrative  . None   Past Surgical History  Procedure Date  . Cholecystectomy   . Abdominal hysterectomy 1984  . Breast surgery   . Cardiac catheterization   . Thyroidectomy   . Neuromas     removed from feet  . Wrist surgery     left  . Orif ankle fracture 01/10/2012    Procedure: OPEN REDUCTION INTERNAL FIXATION (ORIF) ANKLE FRACTURE;  Surgeon: Darreld Mclean, MD;  Location: [facility];  Service: Orthopedics;  Laterality: Left;   Past Medical History  Diagnosis Date  . DM type 2 (diabetes mellitus, type 2)   . TIA (transient ischemic attack) 2010  . Anxiety   . GERD (gastroesophageal reflux disease)   . HTN (hypertension)   . Hypothyroidism   . Depression   . Allergic rhinitis   . Fibromyalgia   . Hemorrhoids   . Migraine headache   . Chest pain     Catheterization 2004 normal coronary arteries  /  nuclear August 2 007, no ischemia  . Joint pain     Pains in multiple joints  . Shortness of breath  March, 2012  . Elevated alkaline phosphatase level   . Dysphagia   . Hiatal hernia   . Abdominal pain, RUQ (right upper quadrant)   . Hypercholesteremia   . Hematochezia   . UTI (lower urinary tract infection)   . Cervical radiculopathy   . Rotator cuff syndrome of right shoulder   . Chronic neck pain   . Chronic back pain     R > L  . Chronic shoulder pain, right   . Lumbar radicular pain     R>L legs   BP 141/80  Pulse 80  Resp 14  Ht 5\' 6"  (1.676 m)  Wt 195 lb (88.451 kg)  BMI 31.47 kg/m2  SpO2 99%     Review of Systems  Constitutional: Positive for fever and chills.  Respiratory: Positive for cough.   Gastrointestinal: Positive for nausea.  Musculoskeletal: Positive for myalgias, back pain and arthralgias.  Neurological: Positive for dizziness and numbness.  Psychiatric/Behavioral: Positive for dysphoric mood. The  patient is nervous/anxious.   All other systems reviewed and are negative.       Objective:   Physical Exam  Constitutional: She is oriented to person, place, and time. She appears well-developed and well-nourished.  HENT:  Head: Normocephalic and atraumatic.  Eyes: Conjunctivae and EOM are normal. Pupils are equal, round, and reactive to light.  Neck: Normal range of motion.  Cardiovascular: Normal rate and regular rhythm.  Pulmonary/Chest: Effort normal.  Abdominal: Soft. Bowel sounds are normal.  Musculoskeletal:  Patient has mild right rotator cuff impingement signs. Subacromial bursa area is tender as well. She did have full range of motion however.  Biceps tendons were more tender to palpation especially the long head.  She is wearing a walking boot over the left ankle/foot and is balancing fairly well.  Left neck has some tenderness with range of motion. Spurling's test is equivocal . She has generalized tenderness over the right hip near the greater trochanter.   Multiple tender points are appreciated today throughout.  Neurological: She is alert and oriented to person, place, and time.  Patient has essentially loss over the C7 distribution. The tendon reflexes are diminished at trace to 1+ throughout both upper extremities however. I did not appreciate any gross motor loss in the upper limbs however today.  s. Cognitively she is intact and at baseline.  Assessment & Plan:   ASSESSMENT:  1. History of fibromyalgia.  2. Chronic low back pain.  3. Rotator cuff syndrome./right shoulder pain. Currentlly picture is most compatible with a right RTC symptom  4. Anxiety and depression.  5. Cervical stenosis with left C7 radiculopathy  6. Recent fall with left trimalleolar ankle fx.   PLAN:  1. She will continue her current medication regimen.  2. Continue conservative mgt of her cervical issues. NS is following this patient as well.  3. Continue with neurontin for her general  FMS pain. 4. Continue with Zanaflex for spasms and back pain. This has been effective as well although it might cause some urine retention, the benefit from her outweighs the negatives in her case. 5. Discussed ROM, strengthening exercises for her right hip and right shoulder. These are mechanical, overuse injuries which generally respond to rest, ROM, strengthening.  If they fail to improve, we can consider a steroid injection. 6.  All questions were encouraged and answered. i will see her back in 3 months. Incidentally i did refill her effexor today.

## 2012-05-14 NOTE — Patient Instructions (Signed)
Trochanteric Bursitis You have hip pain due to trochanteric bursitis. Bursitis means that the sack near the outside of the hip is filled with fluid and inflamed. This sack is made up of protective soft tissue. The pain from trochanteric bursitis can be severe and keep you from sleep. It can radiate to the buttocks or down the outside of the thigh to the knee. The pain is almost always worse when rising from the seated or lying position and with walking. Pain can improve after you take a few steps. It happens more often in people with hip joint and lumbar spine problems, such as arthritis or previous surgery. Very rarely the trochanteric bursa can become infected, and antibiotics and/or surgery may be needed. Treatment often includes an injection of local anesthetic mixed with cortisone medicine. This medicine is injected into the area where it is most tender over the hip. Repeat injections may be necessary if the response to treatment is slow. You can apply ice packs over the tender area for 30 minutes every 2 hours for the next few days. Anti-inflammatory and/or narcotic pain medicine may also be helpful. Limit your activity for the next few days if the pain continues. See your caregiver in 5-10 days if you are not greatly improved.  SEEK IMMEDIATE MEDICAL CARE IF:  You develop severe pain, fever, or increased redness.  You have pain that radiates below the knee. EXERCISES STRETCHING EXERCISES - Trochantic Bursitis  These exercises may help you when beginning to rehabilitate your injury. Your symptoms may resolve with or without further involvement from your physician, physical therapist or athletic trainer. While completing these exercises, remember:   Restoring tissue flexibility helps normal motion to return to the joints. This allows healthier, less painful movement and activity.  An effective stretch should be held for at least 30 seconds.  A stretch should never be painful. You should only  feel a gentle lengthening or release in the stretched tissue. STRETCH  Iliotibial Band  On the floor or bed, lie on your side so your injured leg is on top. Bend your knee and grab your ankle.  Slowly bring your knee back so that your thigh is in line with your trunk. Keep your heel at your buttocks and gently arch your back so your head, shoulders and hips line up.  Slowly lower your leg so that your knee approaches the floor/bed until you feel a gentle stretch on the outside of your thigh. If you do not feel a stretch and your knee will not fall farther, place the heel of your opposite foot on top of your knee and pull your thigh down farther.  Hold this stretch for __________ seconds.  Repeat __________ times. Complete this exercise __________ times per day. STRETCH Hamstrings, Supine   Lie on your back. Loop a belt or towel over the ball of your foot as shown.  Straighten your knee and slowly pull on the belt to raise your injured leg. Do not allow the knee to bend. Keep your opposite leg flat on the floor.  Raise the leg until you feel a gentle stretch behind your knee or thigh. Hold this position for __________ seconds.  Repeat __________ times. Complete this stretch __________ times per day. STRETCH - Quadriceps, Prone   Lie on your stomach on a firm surface, such as a bed or padded floor.  Bend your knee and grasp your ankle. If you are unable to reach, your ankle or pant leg, use a belt around   your foot to lengthen your reach.  Gently pull your heel toward your buttocks. Your knee should not slide out to the side. You should feel a stretch in the front of your thigh and/or knee.  Hold this position for __________ seconds.  Repeat __________ times. Complete this stretch __________ times per day. STRETCHING - Hip Flexors, Lunge Half kneel with your knee on the floor and your opposite knee bent and directly over your ankle.  Keep good posture with your head over your  shoulders. Tighten your buttocks to point your tailbone downward; this will prevent your back from arching too much.  You should feel a gentle stretch in the front of your thigh and/or hip. If you do not feel any resistance, slightly slide your opposite foot forward and then slowly lunge forward so your knee once again lines up over your ankle. Be sure your tailbone remains pointed downward.  Hold this stretch for __________ seconds.  Repeat __________ times. Complete this stretch __________ times per day. STRETCH - Adductors, Lunge  While standing, spread your legs  Lean away from your injured leg by bending your opposite knee. You may rest your hands on your thigh for balance.  You should feel a stretch in your inner thigh. Hold for __________ seconds.  Repeat __________ times. Complete this exercise __________ times per day. Document Released: 08/04/2004 Document Revised: 09/19/2011 Document Reviewed: 10/09/2008 [facility] Patient Information 2013 [place], [place]. Biceps Tendon Tendinitis (Proximal) and Tenosynovitis with Rehab Tendonitis and tenosynovitis involve inflammation of the tendon and the tendon lining (sheath). The proximal biceps tendon is vulnerable to tendonitis and tenosynovitis, which causes pain and discomfort in the front of the shoulder and upper arm. The tendon lining secretes a fluid that helps lubricate the tendon, allowing for proper function without pain. When the tendon and its lining become inflamed, the tendon can no longer glide smoothly, causing pain. The proximal biceps tendon connects the biceps muscle to two bones of the shoulder. It is important for proper function of the elbow and turning the palm upward (supination) using the wrist. Proximal biceps tendon tendinitis may include a grade 1 or 2 strain of the tendon. Grade 1 strains involve a slight pull of the tendon without signs of tearing and no observed tendon lengthening. There is also no loss of strength.  Grade 2 strains involve small tears in the tendon fibers. The tendon or muscle is stretched and strength is usually decreased.  SYMPTOMS   Pain, tenderness, swelling, warmth, or redness over the front of the shoulder.  Pain that gets worse with shoulder and elbow use, especially against resistance.  Limited motion of the shoulder or elbow.  Crackling sound (crepitation) when the tendon or shoulder is moved or touched. CAUSES  The symptoms of biceps tendonitis are due to inflammation of the tendon. Inflammation may be caused by:  Strain from sudden increase in amount or intensity of activity.  Direct blow or injury to the elbow (uncommon).  Overuse or repetitive elbow bending or wrist rotation, particularly when turning the palm up, or with elbow hyperextension. RISK INCREASES WITH:  Sports that involve contact or overhead arm activity (throwing sports, gymnastics, weightlifting, bodybuilding, rock climbing).  Heavy labor.  Poor strength and flexibility.  Failure to warm up properly before activity. PREVENTION  Warm up and stretch properly before activity.  Allow time for recovery between activities.  Maintain physical fitness:  Strength, flexibility, and endurance.  Cardiovascular fitness.  Learn and use proper exercise technique. PROGNOSIS  With  proper treatment, proximal biceps tendon tendonitis and tenosynovitis is usually curable within 6 weeks. Healing is usually quicker if the cause was a direct blow, not overuse.  RELATED COMPLICATIONS   Longer healing time if not properly treated or if not given enough time to heal.  Chronically inflamed tendon that causes persistent pain with activity, that may progress to constant pain and potentially rupture of the tendon.  Recurring symptoms, especially if activity is resumed too soon or with overuse, a direct blow, or use of poor exercise technique. TREATMENT Treatment first involves ice and medicine, to reduce pain and  inflammation. It is helpful to modify activities that cause pain, to reduce the chances of causing the condition to get worse. Strengthening and stretching exercises should be performed to promote proper use of the muscles of the shoulder. These exercises may be performed at home or with a therapist. Other treatments may be given such as ultrasound or heat therapy. A corticosteroid injection may be recommended to help reduce inflammation of the tendon lining. Surgery is usually not necessary. Sometimes, if symptoms last for greater than 6 months, surgery will be advised to detach the tendon and re-insert it into the arm bone. Surgery to correct other shoulder problems that may be contributing to tendinitis may be advised before surgery for the tendinitis itself.  MEDICATION  If pain medicine is needed, nonsteroidal anti-inflammatory medicines (aspirin and ibuprofen), or other minor pain relievers (acetaminophen), are often advised.  Do not take pain medicine for 7 days before surgery.  Prescription pain relievers may be given if your caregiver thinks they are needed. Use only as directed and only as much as you need.  Corticosteroid injections may be given. These injections should only be used on the most severe cases, as one can only receive a limited number of them. HEAT AND COLD   Cold treatment (icing) should be applied for 10 to 15 minutes every 2 to 3 hours for inflammation and pain, and immediately after activity that aggravates your symptoms. Use ice packs or an ice massage.  Heat treatment may be used before performing stretching and strengthening activities prescribed by your caregiver, physical therapist, or athletic trainer. Use a heat pack or a warm water soak. SEEK MEDICAL CARE IF:   Symptoms get worse or do not improve in 2 weeks, despite treatment.  New, unexplained symptoms develop. (Drugs used in treatment may produce side effects.) EXERCISES RANGE OF MOTION (ROM) AND EXERCISES  - Biceps Tendon (Proximal) and Tenosynovitis These exercises may help you when beginning to rehabilitate your injury. Your symptoms may go away with or without further involvement from your physician, physical therapist, or athletic trainer. While completing these exercises, remember:   Restoring tissue flexibility helps normal motion to return to the joints. This allows healthier, less painful movement and activity.  An effective stretch should be held for at least 30 seconds.  A stretch should never be painful. You should only feel a gentle lengthening or release in the stretched tissue. STRETCH  Flexion, Standing  Stand with good posture. With an underhand grip on your right / left hand and an overhand grip on the opposite hand, grasp a broomstick or cane so that your hands are a little more than shoulder width apart.  Keeping your right / left elbow straight and shoulder muscles relaxed, push the stick with your opposite hand to raise your right / left arm in front of your body and then overhead. Raise your arm until you feel a  stretch in your right / left shoulder, but before you have increased shoulder pain.  Try to avoid shrugging your right / left shoulder as your arm rises, by keeping your shoulder blade tucked down and toward your mid-back spine. Hold for __________ seconds.  Slowly return to the starting position. Repeat __________ times. Complete this exercise __________ times per day. STRETCH  Abduction, Supine  Lie on your back. With an underhand grip on your right / left hand and an overhand grip on the opposite hand, grasp a broomstick or cane so that your hands are a little more than shoulder width apart.  Keeping your right / left elbow straight and shoulder muscles relaxed, push the stick with your opposite hand to raise your right / left arm out to the side of your body and then overhead. Raise your arm until you feel a stretch in your right / left shoulder, but before you  have increased shoulder pain.  Try to avoid shrugging your right / left shoulder as your arm rises, by keeping your shoulder blade tucked down and toward your mid-back spine. Hold for __________ seconds.  Slowly return to the starting position. Repeat __________ times. Complete this exercise __________ times per day. ROM  Flexion, Active-Assisted  Lie on your back. You may bend your knees for comfort.  Grasp a broomstick or cane so your hands are about shoulder width apart. Your right / left hand should grip the end of the stick so that your hand is positioned "thumbs-up," as if you were about to shake hands.  Using your healthy arm to lead, raise your right / left arm overhead until you feel a gentle stretch in your shoulder. Hold for __________ seconds.  Use the stick to assist in returning your right / left arm to its starting position. Repeat __________ times. Complete this exercise __________ times per day.  STRETCH  Flexion, Standing   Stand facing a wall. Walk your right / left fingers up the wall until you feel a moderate stretch in your shoulder. As your hand gets higher, you may need to step closer to the wall or use a door frame to walk through.  Try to avoid shrugging your right / left shoulder as your arm rises, by keeping your shoulder blade tucked down and toward your mid-back spine.  Hold for __________ seconds. Use your other hand, if needed, to ease out of the stretch and return to the starting position. Repeat __________ times. Complete this exercise __________ times per day.  ROM - Internal Rotation   Using underhand grips, grasp a stick behind your back with both hands.  While standing upright with good posture, slide the stick up your back until you feel a mild stretch in the front of your shoulder.  Hold for __________ seconds. Slowly return to your starting position. Repeat __________ times. Complete this exercise __________ times per day.  STRETCH - Internal  Rotation  Place your right / left hand behind your back, palm-up.  Throw a towel or belt over your opposite shoulder. Grasp the towel with your right / left hand.  While keeping an upright posture, gently pull up on the towel until you feel a stretch in the front of your right / left shoulder.  Avoid shrugging your right / left shoulder as your arm rises, by keeping your shoulder blade tucked down and toward your mid-back spine.  Hold for __________ seconds. Release the stretch by lowering your opposite hand. Repeat __________ times. Complete this  exercise __________ times per day. STRENGTHENING EXERCISES - Biceps Tendon Tendinitis (Proximal) and Tenosynovitis These exercises may help you regain your strength after your physician has discontinued your restraint in a cast or brace. They may resolve your symptoms with or without further involvement from your physician, physical therapist or athletic trainer. While completing these exercises, remember:   Muscles can gain both the endurance and the strength needed for everyday activities through controlled exercises.  Complete these exercises as instructed by your physician, physical therapist or athletic trainer. Increase the resistance and repetitions only as guided.  You may experience muscle soreness or fatigue, but the pain or discomfort you are trying to eliminate should never worsen during these exercises. If this pain does get worse, stop and make sure you are following the directions exactly. If the pain is still present after adjustments, discontinue the exercise until you can discuss the trouble with your caregiver. STRENGTH - Elbow Flexors, Isometric  Stand or sit upright on a firm surface. Place your right / left arm so that your hand is palm-up and at the height of your waist.  Place your opposite hand on top of your forearm. Gently push down as your right / left arm resists. Push as hard as you can with both arms, without causing  any pain or movement at your right / left elbow. Hold this stationary position for __________ seconds.  Gradually release the tension in both arms. Allow your muscles to relax completely before repeating. Repeat __________ times. Complete this exercise __________ times per day. STRENGTH - Shoulder Flexion, Isometric  With good posture and facing a wall, stand or sit about 4-6 inches away.  Keeping your right / left elbow straight, gently press the top of your fist into the wall. Increase the pressure gradually until you are pressing as hard as you can, without shrugging your shoulder or increasing any shoulder discomfort.  Hold for __________ seconds.  Release the tension slowly. Relax your shoulder muscles completely before you start the next repetition. Repeat __________ times. Complete this exercise __________ times per day.  STRENGTH  Elbow Flexors, Supinated  With good posture, stand or sit on a firm chair without armrests. Allow your right / left arm to rest at your side with your palm facing forward.  Holding a __________ weight, or gripping a rubber exercise band or tubing,  bring your hand toward your shoulder.  Allow your muscles to control the resistance as your hand returns to your side. Repeat __________ times. Complete this exercise __________ times per day.  STRENGTH - Shoulder Flexion  Stand or sit with good posture. Grasp a __________ weight, or an exercise band or tubing, so that your hand is "thumbs-up," like when you shake hands.  Slowly lift your right / left arm as far as you can, without increasing any shoulder pain. At first, many people can only raise their hand to shoulder height.  Avoid shrugging your right / left shoulder as your arm rises, by keeping your shoulder blade tucked down and toward your mid-back spine.  Hold for __________ seconds. Control the descent of your hand as you slowly return to your starting position. Repeat __________ times. Complete  this exercise __________ times per day. Document Released: 06/27/2005 Document Revised: 09/19/2011 Document Reviewed: 10/09/2008 [facility] Patient Information 2013 [place], [place].

## 2012-06-06 ENCOUNTER — Other Ambulatory Visit: Payer: Self-pay | Admitting: *Deleted

## 2012-06-06 MED ORDER — LEVOTHYROXINE SODIUM 112 MCG PO TABS: 112.0000 ug | ORAL_TABLET | Freq: Every day | ORAL | Status: DC

## 2012-06-20 ENCOUNTER — Other Ambulatory Visit: Payer: Self-pay

## 2012-06-20 ENCOUNTER — Other Ambulatory Visit: Payer: Self-pay | Admitting: *Deleted

## 2012-06-20 DIAGNOSIS — IMO0001 Reserved for inherently not codable concepts without codable children: Secondary | ICD-10-CM

## 2012-06-20 DIAGNOSIS — M75100 Unspecified rotator cuff tear or rupture of unspecified shoulder, not specified as traumatic: Secondary | ICD-10-CM

## 2012-06-20 DIAGNOSIS — S82852A Displaced trimalleolar fracture of left lower leg, initial encounter for closed fracture: Secondary | ICD-10-CM

## 2012-06-20 DIAGNOSIS — G43009 Migraine without aura, not intractable, without status migrainosus: Secondary | ICD-10-CM

## 2012-06-20 DIAGNOSIS — M19011 Primary osteoarthritis, right shoulder: Secondary | ICD-10-CM

## 2012-06-20 DIAGNOSIS — H8110 Benign paroxysmal vertigo, unspecified ear: Secondary | ICD-10-CM

## 2012-06-20 MED ORDER — TIZANIDINE HCL 2 MG PO TABS: 1.0000 mg | ORAL_TABLET | Freq: Three times a day (TID) | ORAL | Status: DC | PRN

## 2012-06-20 MED ORDER — OMEPRAZOLE 40 MG PO CPDR: 40.0000 mg | DELAYED_RELEASE_CAPSULE | Freq: Two times a day (BID) | ORAL | Status: DC

## 2012-06-27 ENCOUNTER — Ambulatory Visit: Payer: Medicare Other | Admitting: Internal Medicine

## 2012-07-12 ENCOUNTER — Other Ambulatory Visit: Payer: Self-pay | Admitting: *Deleted

## 2012-07-12 MED ORDER — LEVOTHYROXINE SODIUM 112 MCG PO TABS: 112.0000 ug | ORAL_TABLET | Freq: Every day | ORAL | Status: DC

## 2012-07-13 ENCOUNTER — Ambulatory Visit: Payer: Medicare Other

## 2012-07-17 ENCOUNTER — Telehealth: Payer: Self-pay | Admitting: *Deleted

## 2012-07-17 NOTE — Telephone Encounter (Signed)
[name] [name] called and said that you said you would take over her prescription for welbutrin xl since she [place] no longer able to [place] place [place] (?)  Is it ok to refill her wellbutrin which she has been out of for 4 days. (She has been taking with the effexor.)

## 2012-07-18 MED ORDER — BUPROPION HCL ER (XL) 150 MG PO TB24: 150.0000 mg | ORAL_TABLET | Freq: Every day | ORAL | Status: DC

## 2012-07-18 NOTE — Telephone Encounter (Signed)
Lourene notified of refill.

## 2012-07-18 NOTE — Telephone Encounter (Signed)
done

## 2012-08-01 ENCOUNTER — Ambulatory Visit: Payer: Medicare Other | Admitting: Internal Medicine

## 2012-08-13 ENCOUNTER — Telehealth: Payer: Self-pay | Admitting: *Deleted

## 2012-08-13 NOTE — Telephone Encounter (Signed)
Agree with immediate evaluation in ED. Thanks.

## 2012-08-13 NOTE — Telephone Encounter (Signed)
Pt reports that she has been having "terrible" sweating episodes x 1 week, appr 4x daily, also states last week she had an episode of chest pain, nausea, weakness, dizziness. She used 2 ntg at that time and felt improved. She is ask at this time to please go to ED or urg care for evaluation. She had hoped for a lab appt but RN feels it would be in pt's best interest to be evaluated asap in the emergent setting. Pt is agreeable, f/u appt is made for thurs 2/6 at 0945 dr Loistine Chance, she states she will have someone take her to urg care or ED now

## 2012-08-14 ENCOUNTER — Encounter: Payer: Self-pay | Admitting: Physical Medicine & Rehabilitation

## 2012-08-14 ENCOUNTER — Encounter: Payer: Medicare Other | Attending: Physical Medicine & Rehabilitation | Admitting: Physical Medicine & Rehabilitation

## 2012-08-14 VITALS — BP 144/75 | HR 99 | Resp 14 | Ht 66.0 in | Wt 192.4 lb

## 2012-08-14 DIAGNOSIS — F411 Generalized anxiety disorder: Secondary | ICD-10-CM | POA: Insufficient documentation

## 2012-08-14 DIAGNOSIS — G8929 Other chronic pain: Secondary | ICD-10-CM | POA: Insufficient documentation

## 2012-08-14 DIAGNOSIS — F329 Major depressive disorder, single episode, unspecified: Secondary | ICD-10-CM | POA: Insufficient documentation

## 2012-08-14 DIAGNOSIS — M67919 Unspecified disorder of synovium and tendon, unspecified shoulder: Secondary | ICD-10-CM | POA: Insufficient documentation

## 2012-08-14 DIAGNOSIS — M545 Low back pain, unspecified: Secondary | ICD-10-CM | POA: Insufficient documentation

## 2012-08-14 DIAGNOSIS — M5412 Radiculopathy, cervical region: Secondary | ICD-10-CM

## 2012-08-14 DIAGNOSIS — M19019 Primary osteoarthritis, unspecified shoulder: Secondary | ICD-10-CM | POA: Insufficient documentation

## 2012-08-14 DIAGNOSIS — H8110 Benign paroxysmal vertigo, unspecified ear: Secondary | ICD-10-CM

## 2012-08-14 DIAGNOSIS — M25519 Pain in unspecified shoulder: Secondary | ICD-10-CM | POA: Insufficient documentation

## 2012-08-14 DIAGNOSIS — M4802 Spinal stenosis, cervical region: Secondary | ICD-10-CM | POA: Insufficient documentation

## 2012-08-14 DIAGNOSIS — M7520 Bicipital tendinitis, unspecified shoulder: Secondary | ICD-10-CM

## 2012-08-14 DIAGNOSIS — M75100 Unspecified rotator cuff tear or rupture of unspecified shoulder, not specified as traumatic: Secondary | ICD-10-CM

## 2012-08-14 DIAGNOSIS — M797 Fibromyalgia: Secondary | ICD-10-CM

## 2012-08-14 DIAGNOSIS — E039 Hypothyroidism, unspecified: Secondary | ICD-10-CM

## 2012-08-14 DIAGNOSIS — IMO0001 Reserved for inherently not codable concepts without codable children: Secondary | ICD-10-CM | POA: Insufficient documentation

## 2012-08-14 DIAGNOSIS — Z8781 Personal history of (healed) traumatic fracture: Secondary | ICD-10-CM | POA: Insufficient documentation

## 2012-08-14 DIAGNOSIS — F3289 Other specified depressive episodes: Secondary | ICD-10-CM | POA: Insufficient documentation

## 2012-08-14 DIAGNOSIS — M719 Bursopathy, unspecified: Secondary | ICD-10-CM | POA: Insufficient documentation

## 2012-08-14 MED ORDER — CLONAZEPAM 0.5 MG PO TABS: 0.5000 mg | ORAL_TABLET | Freq: Every evening | ORAL | Status: DC | PRN

## 2012-08-14 NOTE — Progress Notes (Signed)
Subjective:    Patient ID: [name] [name], female    DOB: 04-Mar-1946, 67 y.o.   MRN: [id number]  HPI  [name] [name] is here in follow up of her chronic pain. She states that her back pain has recurred from below her bra strap to her waist. She also reports continued pain in her right shoulder. She has been doing a lot of exercising with her grandchildren. She will typically hold the baby over her head. She also sleeps on her right side quite a bit.   Her back pain bothers her when she sews and even on days when she doesn't. She still is not taking rest breaks despite my recommendations to the contrary.   She reports occasional increased sweating recently. She incdently reports increase in her synthroid to .  Pain Inventory Average Pain 7 Pain Right Now 8 My pain is constant, stabbing and aching  In the last 24 hours, has pain interfered with the following? General activity 3 Relation with others 3 Enjoyment of life 8 What TIME of day is your pain at its worst? daytime and night Sleep (in general) Fair  Pain is worse with: bending, sitting and some activites Pain improves with: rest and medication Relief from Meds: 6  Mobility use a cane how many minutes can you walk? 15 ability to climb steps?  yes do you drive?  no  Function disabled: date disabled 2004 I need assistance with the following:  meal prep, household duties and shopping  Neuro/Psych bladder control problems weakness numbness spasms dizziness depression anxiety  Prior Studies Any changes since last visit?  no  Physicians involved in your care Any changes since last visit?  no   Family History  Problem Relation Age of Onset  . Cancer      breast -- grandmother  . Cancer      lung -- uncles (3)  . Leukemia Sister   . Diabetes Father   . Diabetes Mother   . Coronary artery disease Father    History   Social History  . Marital Status: Married    Spouse Name: N/A    Number of Children: N/A   . Years of Education: N/A   Social History Main Topics  . Smoking status: Never Smoker   . Smokeless tobacco: Never Used  . Alcohol Use: No  . Drug Use: No  . Sexually Active: None   Other Topics Concern  . None   Social History Narrative  . None   Past Surgical History  Procedure Date  . Cholecystectomy   . Abdominal hysterectomy 1984  . Breast surgery   . Cardiac catheterization   . Thyroidectomy   . Neuromas     removed from feet  . Wrist surgery     left  . Orif ankle fracture 01/10/2012    Procedure: OPEN REDUCTION INTERNAL FIXATION (ORIF) ANKLE FRACTURE;  Surgeon: Darreld Mclean, MD;  Location: [facility];  Service: Orthopedics;  Laterality: Left;   Past Medical History  Diagnosis Date  . DM type 2 (diabetes mellitus, type 2)   . TIA (transient ischemic attack) 2010  . Anxiety   . GERD (gastroesophageal reflux disease)   . HTN (hypertension)   . Hypothyroidism   . Depression   . Allergic rhinitis   . Fibromyalgia   . Hemorrhoids   . Migraine headache   . Chest pain     Catheterization 2004 normal coronary arteries  /  nuclear August 2 007, no ischemia  . Joint  pain     Pains in multiple joints  . Shortness of breath     March, 2012  . Elevated alkaline phosphatase level   . Dysphagia   . Hiatal hernia   . Abdominal pain, RUQ (right upper quadrant)   . Hypercholesteremia   . Hematochezia   . UTI (lower urinary tract infection)   . Cervical radiculopathy   . Rotator cuff syndrome of right shoulder   . Chronic neck pain   . Chronic back pain     R > L  . Chronic shoulder pain, right   . Lumbar radicular pain     R>L legs   BP 144/75  Pulse 99  Resp 14  Ht 5\' 6"  (1.676 m)  Wt 192 lb 6.4 oz (87.272 kg)  BMI 31.05 kg/m2  SpO2 96%    Review of Systems  Constitutional: Positive for fever, chills, diaphoresis and unexpected weight change.  Respiratory: Positive for cough.   Cardiovascular: Positive for leg swelling.  Gastrointestinal: Positive for  nausea and abdominal pain.  Genitourinary:       Bladder control problems   Musculoskeletal:       Spasms   Neurological: Positive for dizziness and numbness.  Psychiatric/Behavioral: Positive for dysphoric mood. The patient is nervous/anxious.        Objective:   Physical Exam Constitutional: She is oriented to person, place, and time. She appears well-developed and well-nourished.  HENT:  Head: Normocephalic and atraumatic.  Eyes: Conjunctivae and EOM are normal. Pupils are equal, round, and reactive to light.  Neck: Normal range of motion.  Cardiovascular: Normal rate and regular rhythm.  Pulmonary/Chest: Effort normal.  Abdominal: Soft. Bowel sounds are normal.  Musculoskeletal:  Patient has mild right rotator cuff impingement signs. Subacromial bursa area is tender as well. She did have full range of motion however. Biceps tendons were less tender to palpation  .   Left neck has some tenderness with range of motion. Spurling's test is equivocal . She has generalized tenderness over the right hip near the greater trochanter.   Multiple tender points are appreciated today throughout. She has tenderness to palpation over the right lumbar parapsinals. These muscles were very taut as well.  Neurological: She is alert and oriented to person, place, and time.  Patient has essentially loss over the C7 distribution. The tendon reflexes are diminished at trace to 1+ throughout both upper extremities however. I did not appreciate any gross motor loss in the upper limbs however today. s. Cognitively she is intact and at baseline.  Assessment & Plan:   ASSESSMENT:  1. History of fibromyalgia.  2. Chronic low back pain. Myofascial pain.  3. Rotator cuff syndrome./right shoulder pain. Currentlly picture is most compatible with a right RTC symptom  4. Anxiety and depression.  5. Cervical stenosis with left C7 radiculopathy  6. Hx of fall with left trimalleolar ankle fx.    PLAN:  1. She  will continue her current medication regimen.  2. Continue conservative mgt of her cervical issues. NS is following this patient as well.  3. Continue with neurontin for her general FMS pain. Resumed klonopin for sleep/ hs anxiety 4. Continue with Zanaflex for spasms and back pain. This has been effective as well although it might cause some urine retention, the benefit from her outweighs the negatives in her case.  5. Discussed ROM, strengthening exercises for her right hip and right shoulder. After informed consent and preparation of the skin with betadine, I injected  40mg  kenalog and 3cc of 1% lidocaine into the right shoulder via lateral approach. The patient tolerated well, and no complications were encountered. Afterward the area was cleaned and dressed. Post- injection instructions were provided.  6. All questions were encouraged and answered. i will see her back in 3 months.   I asked her to follow up with her PC regarding thyroid.

## 2012-08-14 NOTE — Patient Instructions (Signed)
WORK ON REGULAR EXERCISES AND STRETCHING FOR YOUR SHOULDERS, LOW BACK.  YOU NEED TO TAKE REST BREAKS WHEN YOU SEW.  EVERY 15 MINUTES

## 2012-08-16 ENCOUNTER — Ambulatory Visit: Payer: Medicare Other | Admitting: Internal Medicine

## 2012-08-21 ENCOUNTER — Encounter: Payer: Self-pay | Admitting: Internal Medicine

## 2012-08-21 ENCOUNTER — Ambulatory Visit (INDEPENDENT_AMBULATORY_CARE_PROVIDER_SITE_OTHER): Payer: Medicare Other | Admitting: Internal Medicine

## 2012-08-21 VITALS — BP 138/84 | HR 96 | Temp 97.9°F | Ht 66.0 in | Wt 191.5 lb

## 2012-08-21 DIAGNOSIS — Z23 Encounter for immunization: Secondary | ICD-10-CM

## 2012-08-21 DIAGNOSIS — Z79899 Other long term (current) drug therapy: Secondary | ICD-10-CM

## 2012-08-21 DIAGNOSIS — E119 Type 2 diabetes mellitus without complications: Secondary | ICD-10-CM

## 2012-08-21 DIAGNOSIS — I10 Essential (primary) hypertension: Secondary | ICD-10-CM

## 2012-08-21 DIAGNOSIS — E7800 Pure hypercholesterolemia, unspecified: Secondary | ICD-10-CM

## 2012-08-21 DIAGNOSIS — M79609 Pain in unspecified limb: Secondary | ICD-10-CM

## 2012-08-21 DIAGNOSIS — R45851 Suicidal ideations: Secondary | ICD-10-CM

## 2012-08-21 DIAGNOSIS — E039 Hypothyroidism, unspecified: Secondary | ICD-10-CM

## 2012-08-21 DIAGNOSIS — H8110 Benign paroxysmal vertigo, unspecified ear: Secondary | ICD-10-CM

## 2012-08-21 DIAGNOSIS — R079 Chest pain, unspecified: Secondary | ICD-10-CM | POA: Insufficient documentation

## 2012-08-21 LAB — LIPID PANEL
Cholesterol: 179 mg/dL (ref 0–200)
HDL: 71 mg/dL (ref >39)
LDL Cholesterol: 83 mg/dL (ref 0–99)
Triglycerides: 126 mg/dL (ref <150)
VLDL: 25 mg/dL (ref 0–40)

## 2012-08-21 LAB — BASIC METABOLIC PANEL WITHOUT GFR
BUN: 15 mg/dL (ref 6–23)
CO2: 28 meq/L (ref 19–32)
Calcium: 9.5 mg/dL (ref 8.4–10.5)
Chloride: 100 meq/L (ref 96–112)
Creat: 0.72 mg/dL (ref 0.50–1.10)
GFR, Est African American: >89 mL/min
GFR, Est Non African American: 89 mL/min
Glucose, Bld: 95 mg/dL (ref 70–99)
Potassium: 4.5 meq/L (ref 3.5–5.3)
Sodium: 138 meq/L (ref 135–145)

## 2012-08-21 NOTE — Assessment & Plan Note (Signed)
Patient was accompanied her sister for 2-D echo and started having sudden onset chest pain for which she came to clinic for evaluation. Her chest pain was substernal in location with radiation to jaw and neck, associated with diaphoresis and lasted for 5 minutes. The obtained EKG in the clinic which was a normal sinus rhythm, did not show any acute ST-T wave changes. Does not have a history of MI and states had a similar pain about 20 years ago. Her blood pressure was well-controlled and her A1c was 5.8. She also reports having burning time chest pain for last 1-2 weeks, not getting better with Prilosec. I think her pain was likely related to anxiety versus panic attack. Cardiac etiology appears unlikely with normal EKG. Other possibilities GERD. - Continue Prilosec. She was advised to take Maalox/TUMS to help with acid reflux. We may consider switching her to stronger PPI like AcipHex with next visit if her GERD symptoms continues to be uncontrolled. - She was advised to avoid caffeine, use pillows at night, eat melas atleast 2-3 hours before going to bed.  - Check lipid panel today. - Call the clinic or go to the ER if she has return of severe chest pain associated with shortness of breath or nausea.

## 2012-08-21 NOTE — Assessment & Plan Note (Signed)
BP Readings from Last 3 Encounters:  08/21/12 138/84  08/14/12 144/75  05/14/12 141/80    Lab Results  Component Value Date   NA 139 04/27/2012   K 4.6 04/27/2012   CREATININE 0.64 04/27/2012    Assessment:  Blood pressure control: mildly elevated  Progress toward BP goal:  unchanged  Comments: She was noted to have high blood pressure at the echo lab where she had an episode of chest pain but her blood pressure was completely normal and the clinic.   Plan:  Medications:  continue current medications  Educational resources provided:    Self management tools provided:    Other plans: Check BMET.

## 2012-08-21 NOTE — Assessment & Plan Note (Signed)
Her A1c was 5.8 today. Congratulated her on excellent diet and in turn diabetes control!

## 2012-08-21 NOTE — Patient Instructions (Addendum)
General Instructions: Please keep up with your scheduled appointment with Dr. Meredith Pel _ . Please bring your medication bottles with your next appointment. Please take your medicines as prescribed. I will call you with your lab results if anything will be abnormal. Please take Sherin Quarry for your acid reflux in addition to your prilosec.     Treatment Goals:  Goals (1 Years of Data) as of 08/21/12         As of Today 04/04/12 11/16/11 06/16/11     Result Component    . HEMOGLOBIN A1C < 7.0  5.8 5.8 5.9 6.1      Progress Toward Treatment Goals:  Treatment Goal 08/21/2012  Hemoglobin A1C at goal  Blood pressure unchanged    Self Care Goals & Plans:  Self Care Goal 08/21/2012  Manage my medications take my medicines as prescribed; bring my medications to every visit; refill my medications on time; follow the sick day instructions if I am sick  Monitor my health keep track of my blood glucose; bring my glucose meter and log to each visit; keep track of my weight; check my feet daily  Eat healthy foods eat more vegetables; eat fruit for snacks and desserts; eat smaller portions; drink diet soda or water instead of juice or soda  Be physically active take a walk every day; find an activity I enjoy    Home Blood Glucose Monitoring 08/21/2012  Check my blood sugar once a day     Care Management & Community Referrals:  Referral 08/21/2012  Referrals made for care management support none needed

## 2012-08-21 NOTE — Progress Notes (Signed)
Subjective:   Patient ID: [name] [name] female   DOB: 12/25/1945 67 y.o.   MRN: [id number]  HPI: 66 year old woman with past medical history significant for hypertension, hypothyroidism, presents to the clinic for evaluation of chest pain.  Patient was accompanying her sister for 2-D echo today, when she started having sudden onset chest pain. She describes her chest pain as dull in nature in the substernal area but it was sharp in nature with its radiation to the neck and jaw, rated her pain 8/10 on its worst, lasted for 5 minutes, associated with sweating( she has been having increased sweating anyways lately) . Denies any shortness of breath, nausea or vomiting associated with it. Requested nurses at the echo lab to check her blood pressure  and it was found to be176/92 and she decided to come to the clinic for evaluation. Her chest pain has completely resolved right now. On retrospect , the patient reports that she has been having burning pain in her chest for last one week that she attributes to her GERD. She has been taking her Prilosec but that has not been helping her.  She also reports that she has return of her dizzy spells for last 2 weeks. She describes her dizziness as feeling lightheaded and room spinning. She also reports having tinnitus in her right ear that she describes as feeling of "flutter". Denies any pain or discharge or hearing loss associated with it. She also reports left sided hearing loss. She states that it's dizzy with her head movement on the right side and when she gets up in the bed from laying /sitting position.  She was never able to followup with vestibular rehabilitation as she didn't hear back from the vestibular rehabilitation nurse .  She also reports having palpitations and sweating at night for last 2 weeks as well and would like to get her TSH evalutaed.      Past Medical History  Diagnosis Date  . DM type 2 (diabetes mellitus, type 2)   . TIA (transient  ischemic attack) 2010  . Anxiety   . GERD (gastroesophageal reflux disease)   . HTN (hypertension)   . Hypothyroidism   . Depression   . Allergic rhinitis   . Fibromyalgia   . Hemorrhoids   . Migraine headache   . Chest pain     Catheterization 2004 normal coronary arteries  /  nuclear August 2 007, no ischemia  . Joint pain     Pains in multiple joints  . Shortness of breath     March, 2012  . Elevated alkaline phosphatase level   . Dysphagia   . Hiatal hernia   . Abdominal pain, RUQ (right upper quadrant)   . Hypercholesteremia   . Hematochezia   . UTI (lower urinary tract infection)   . Cervical radiculopathy   . Rotator cuff syndrome of right shoulder   . Chronic neck pain   . Chronic back pain     R > L  . Chronic shoulder pain, right   . Lumbar radicular pain     R>L legs   Family History  Problem Relation Age of Onset  . Cancer      breast -- grandmother  . Cancer      lung -- uncles (3)  . Leukemia Sister   . Diabetes Father   . Diabetes Mother   . Coronary artery disease Father    History   Social History  . Marital Status: Married  Spouse Name: N/A    Number of Children: N/A  . Years of Education: N/A   Occupational History  . Not on file.   Social History Main Topics  . Smoking status: Never Smoker   . Smokeless tobacco: Never Used  . Alcohol Use: No  . Drug Use: No  . Sexually Active: Not on file   Other Topics Concern  . Not on file   Social History Narrative  . No narrative on file   Review of Systems: General: Denies fever, chills, diaphoresis, appetite change and fatigue. HEENT: Denies photophobia, eye pain, redness, hearing loss, ear pain, congestion, sore throat, rhinorrhea, sneezing, mouth sores, trouble swallowing, neck pain, neck stiffness and tinnitus. Respiratory: Denies SOB, DOE, cough, chest tightness, and wheezing. Cardiovascular: Denies to chest pain, palpitations and leg swelling. Gastrointestinal: Denies nausea,  vomiting, abdominal pain, diarrhea, constipation, blood in stool and abdominal distention. Genitourinary: Denies dysuria, urgency, frequency, hematuria, flank pain and difficulty urinating. Musculoskeletal: Denies myalgias, back pain, joint swelling, arthralgias and gait problem.  Skin: Denies pallor, rash and wound. Neurological: Denies dizziness, seizures, syncope, weakness, light-headedness, numbness and headaches. Hematological: Denies adenopathy, easy bruising, personal or family bleeding history. Psychiatric/Behavioral: Denies suicidal ideation, mood changes, confusion, nervousness, sleep disturbance and agitation.    Current Outpatient Medications: Current Outpatient Prescriptions  Medication Sig Dispense Refill  . albuterol (PROVENTIL HFA;VENTOLIN HFA) 108 (90 BASE) MCG/ACT inhaler Inhale 2 puffs into the lungs every 6 (six) hours as needed. As needed for shortness of breath and coughing.      Marland Kitchen aspirin EC 81 MG tablet Take 81 mg by mouth daily.      Marland Kitchen buPROPion (WELLBUTRIN XL) 150 MG 24 hr tablet Take 1 tablet (150 mg total) by mouth daily.  30 tablet  5  . Cholecalciferol (VITAMIN D3) 2000 UNITS TABS Take 1 tablet by mouth daily.      . clonazePAM (KLONOPIN) 0.5 MG tablet Take 1 tablet (0.5 mg total) by mouth at bedtime as needed for anxiety. 90 DAY RX  90 tablet  2  . diclofenac sodium (VOLTAREN) 1 % GEL Apply 4 g topically daily as needed. As needed for pain.  5 Tube  5  . fluticasone (FLONASE) 50 MCG/ACT nasal spray Place 2 sprays into the nose daily.      Marland Kitchen gabapentin (NEURONTIN) 300 MG capsule Take 1 capsule (300 mg total) by mouth 3 (three) times daily.  90 capsule  3  . HYDROcodone-acetaminophen (VICODIN) 5-500 MG per tablet Take 1 tablet by mouth every 6 (six) hours as needed.      . hyoscyamine (HYOMAX-SL) 0.125 MG SL tablet Dissolve 1 to 2 tablets under the tongue every 4 (four) hours as needed for chest pain.      Marland Kitchen levothyroxine (SYNTHROID, LEVOTHROID) 112 MCG tablet Take  1 tablet (112 mcg total) by mouth daily.  31 tablet  1  . losartan (COZAAR) 25 MG tablet Take 2 tablets (50 mg total) by mouth daily.  30 tablet  6  . meclizine (ANTIVERT) 25 MG tablet Take 1 tablet (25 mg total) by mouth 3 (three) times daily as needed for dizziness.  30 tablet  1  . mirabegron ER (MYRBETRIQ) 50 MG TB24 Take 50 mg by mouth daily.      Marland Kitchen omeprazole (PRILOSEC) 40 MG capsule Take 1 capsule (40 mg total) by mouth 2 (two) times daily.  60 capsule  6  . pravastatin (PRAVACHOL) 20 MG tablet Take 20 mg by mouth daily.      Marland Kitchen  SUMAtriptan (IMITREX) 50 MG tablet Take 50 mg by mouth every 2 (two) hours as needed. Take one tablet as needed by mouth at onset of headache as directed.      . tetrahydrozoline-zinc (VISINE-AC) 0.05-0.25 % ophthalmic solution Place 2 drops into both eyes 3 (three) times daily as needed. Dry Eyes      . tiZANidine (ZANAFLEX) 2 MG tablet Take 0.5-1 tablets (1-2 mg total) by mouth every 8 (eight) hours as needed.  90 tablet  2  . trimethoprim (TRIMPEX) 100 MG tablet Take 100 mg by mouth daily.       Marland Kitchen venlafaxine XR (EFFEXOR-XR) 150 MG 24 hr capsule Take 1 capsule (150 mg total) by mouth 2 (two) times daily.  60 capsule  3   No current facility-administered medications for this visit.    Allergies: Allergies  Allergen Reactions  . Nsaids Anaphylaxis  . Relafen (Nabumetone) Anaphylaxis and Swelling  . Diclofenac Sodium Swelling    "almost died" caused neck to swell.   . Other     Honey causes throat to swell. Strawberries cause a rash.   . Trazodone And Nefazodone     Possible akathisia  . Penicillins Swelling and Rash    Tongue swelled       Objective:   Physical Exam: Filed Vitals:   08/21/12 1405  BP: 152/89  Pulse: 87  Temp: 97.9 F (36.6 C)    General: Vital signs reviewed and noted. Well-developed, well-nourished, in no acute distress; alert, appropriate and cooperative throughout examination. Head: Normocephalic, atraumatic Lungs: Normal  respiratory effort. Clear to auscultation BL without crackles or wheezes. Heart: RRR. S1 and S2 normal without gallop, murmur, or rubs. Abdomen:BS normoactive. Soft, Nondistended, non-tender.  No masses or organomegaly. Extremities: No pretibial edema.     Assessment & Plan:

## 2012-08-21 NOTE — Assessment & Plan Note (Signed)
She reports return of dizzy spells for last 2 weeks - which she describes as "spinning and lightheadedness" .  her dizziness gets worse when she turns her head ( particularly to left) and when she gets up from laying position. Her orthostatic vitals were checked in the clinic and she was not orthostatic. I think her symptoms are likely related to BPPV. Patient was referred to vestibular rehabilitation in the past but she could never  get the therapy .Patient has participated in gait and balance physical therapy in the past.   -Will try to arrange physical therapy/ vestibular rehabilitation for the patient. -Continue PRN meclizine

## 2012-08-21 NOTE — Assessment & Plan Note (Signed)
She has been complaining of symptoms of palpitations and increased sweating lately. Recheck TSH

## 2012-08-29 ENCOUNTER — Encounter: Payer: Self-pay | Admitting: Internal Medicine

## 2012-08-29 ENCOUNTER — Ambulatory Visit (INDEPENDENT_AMBULATORY_CARE_PROVIDER_SITE_OTHER): Payer: Medicare Other | Admitting: Internal Medicine

## 2012-08-29 VITALS — BP 139/88 | HR 85 | Temp 97.5°F | Ht 66.0 in | Wt 191.1 lb

## 2012-08-29 DIAGNOSIS — H8110 Benign paroxysmal vertigo, unspecified ear: Secondary | ICD-10-CM

## 2012-08-29 DIAGNOSIS — R079 Chest pain, unspecified: Secondary | ICD-10-CM

## 2012-08-29 DIAGNOSIS — E119 Type 2 diabetes mellitus without complications: Secondary | ICD-10-CM

## 2012-08-29 DIAGNOSIS — E7800 Pure hypercholesterolemia, unspecified: Secondary | ICD-10-CM

## 2012-08-29 DIAGNOSIS — I10 Essential (primary) hypertension: Secondary | ICD-10-CM

## 2012-08-29 LAB — GLUCOSE, CAPILLARY: Glucose-Capillary: 103 mg/dL — ABNORMAL HIGH (ref 70–99)

## 2012-08-29 MED ORDER — ZOSTER VACCINE LIVE 19400 UNT/0.65ML ~~LOC~~ SOLR: 0.6500 mL | Freq: Once | SUBCUTANEOUS | Status: DC

## 2012-08-29 MED ORDER — FLUTICASONE PROPIONATE 50 MCG/ACT NA SUSP: 2.0000 | Freq: Every day | NASAL | Status: DC

## 2012-08-29 NOTE — Assessment & Plan Note (Signed)
Lab Results  Component Value Date   HGBA1C 5.8 08/21/2012   HGBA1C 5.8 04/04/2012   HGBA1C 5.9 11/16/2011     Assessment:  Diabetes control: good control (HgbA1C at goal)  Progress toward A1C goal:  at goal  Comments: Diabetes is well controlled on no medication  Plan:  Medications:  continue lifestyle management  Home glucose monitoring:   Frequency: once a day   Timing: before breakfast  Instruction/counseling given: reminded to bring blood glucose meter & log to each visit  Educational resources provided: brochure  Self management tools provided: home glucose logbook

## 2012-08-29 NOTE — Progress Notes (Signed)
  Subjective:    Patient ID: [name] [name], female    DOB: March 12, 1946, 67 y.o.   MRN: [id number]  HPI Patient returns for followup of an episode of chest pain for which she was seen in our clinic on 08/21/2012, and for followup of other problems including positional vertigo, diabetes, hypertension, and hypercholesterolemia.  Her chest pain was felt to be noncardiac at the time of her last visit, and attributed to GERD.  She reports no further episodes of chest pain since that visit.  She says the substernal chest pain is related to meals, and she feels that the symptoms are consistent with GERD.  She is still having episodes of positional vertigo, and reports bilateral chronic tinnitus; we have referred her in the past for vestibular rehabilitation but apparently she has not been seen for this by physical therapy.   Review of Systems  Neurological: Positive for dizziness (positional vertigo without change).       Objective:   Physical Exam  Constitutional: No distress.  Eyes: Right eye exhibits no nystagmus.  Cardiovascular: Normal rate, regular rhythm and normal heart sounds.  Exam reveals no gallop and no friction rub.   No murmur heard. Pulmonary/Chest: Effort normal and breath sounds normal. No respiratory distress. She has no wheezes. She has no rales.  Abdominal: Soft. Bowel sounds are normal. She exhibits no distension. There is no tenderness. There is no rebound and no guarding.  Musculoskeletal: She exhibits no edema.        Assessment & Plan:

## 2012-08-29 NOTE — Assessment & Plan Note (Signed)
Lipids:    Component Value Date/Time   CHOL 179 08/21/2012 1510   TRIG 126 08/21/2012 1510   HDL 71 08/21/2012 1510   LDLCALC 83 08/21/2012 1510   VLDL 25 08/21/2012 1510   CHOLHDL 2.5 08/21/2012 1510    Assessment: LDL is at goal on current dose of pravastatin 20 mg daily.  Plan: Continue pravastatin 20 mg daily.

## 2012-08-29 NOTE — Assessment & Plan Note (Signed)
BP Readings from Last 3 Encounters:  08/29/12 139/88  08/21/12 138/84  08/14/12 144/75    Lab Results  Component Value Date   NA 138 08/21/2012   K 4.5 08/21/2012   CREATININE 0.72 08/21/2012    Assessment:  Blood pressure control: controlled  Progress toward BP goal:  at goal  Comments: patient is doing well on losartan 50 mg daily   Plan:  Medications:  continue current medications  Educational resources provided: brochure  Self management tools provided: home blood pressure logbook

## 2012-08-29 NOTE — Patient Instructions (Addendum)
General Instructions: Continue current medications. A referral to an ENT specialist has been made to evaluate your vertigo.   Treatment Goals:  Goals (1 Years of Data) as of 08/29/12         As of Today 08/21/12 08/21/12 08/21/12 08/21/12     Blood Pressure    . Blood Pressure < 140/90  139/88 138/84 137/89 145/87 152/89     Result Component    . HEMOGLOBIN A1C < 7.0   5.8       . LDL CALC < 100   83         Progress Toward Treatment Goals:  Treatment Goal 08/29/2012  Hemoglobin A1C at goal  Blood pressure at goal    Self Care Goals & Plans:  Self Care Goal 08/29/2012  Manage my medications take my medicines as prescribed; bring my medications to every visit; refill my medications on time; follow the sick day instructions if I am sick  Monitor my health keep track of my blood glucose; bring my glucose meter and log to each visit; keep track of my weight; check my feet daily  Eat healthy foods eat more vegetables; eat fruit for snacks and desserts; eat smaller portions; drink diet soda or water instead of juice or soda  Be physically active take a walk every day; find an activity I enjoy    Home Blood Glucose Monitoring 08/29/2012  Check my blood sugar once a day  When to check my blood sugar before breakfast     Care Management & Community Referrals:  Referral 08/29/2012  Referrals made for care management support none needed  Referrals made to community resources other (see comments)

## 2012-08-29 NOTE — Assessment & Plan Note (Signed)
Assessment: Patient reports no recurrence of her substernal chest pain, and feels that the symptom is related to GERD.  Her EKG at the time of her last visit was unremarkable.  She has had a prior cardiac workup including a negative cardiac catheterization in 2004 and a negative nuclear medicine stress study in 2007.  Plan: I advised patient to return if she has recurrence of chest pain; for now, her symptoms seem consistent with GERD and have not recurred.  Plan is to continue current management of GERD.  I discussed strategies including not eating within one hour of bedtime and elevating the head of her bed a few inches to decrease reflux.  Will continue PPI.

## 2012-08-29 NOTE — Assessment & Plan Note (Signed)
Assessment: Patient symptoms are consistent with benign positional vertigo.  Prior attempts to refer her for vestibular rehabilitation have been unsuccessful.  Plan: The plan is to refer to ENT for evaluation and treatment.  Will continue meclizine on an as-needed basis.

## 2012-08-30 ENCOUNTER — Other Ambulatory Visit: Payer: Self-pay | Admitting: Physical Medicine & Rehabilitation

## 2012-09-05 ENCOUNTER — Other Ambulatory Visit: Payer: Self-pay | Admitting: *Deleted

## 2012-09-05 MED ORDER — LEVOTHYROXINE SODIUM 112 MCG PO TABS: 112.0000 ug | ORAL_TABLET | Freq: Every day | ORAL | Status: DC

## 2012-09-13 NOTE — Progress Notes (Signed)
This encounter was created in error - please disregard.

## 2012-09-28 ENCOUNTER — Other Ambulatory Visit: Payer: Self-pay | Admitting: Physical Medicine & Rehabilitation

## 2012-10-03 ENCOUNTER — Other Ambulatory Visit: Payer: Self-pay | Admitting: *Deleted

## 2012-10-04 MED ORDER — LOSARTAN POTASSIUM 25 MG PO TABS: 50.0000 mg | ORAL_TABLET | Freq: Every day | ORAL | Status: DC

## 2012-10-16 ENCOUNTER — Encounter: Payer: Self-pay | Admitting: Internal Medicine

## 2012-10-16 DIAGNOSIS — R269 Unspecified abnormalities of gait and mobility: Secondary | ICD-10-CM | POA: Insufficient documentation

## 2012-10-31 ENCOUNTER — Ambulatory Visit (INDEPENDENT_AMBULATORY_CARE_PROVIDER_SITE_OTHER): Payer: Medicare Other | Admitting: Internal Medicine

## 2012-10-31 ENCOUNTER — Encounter: Payer: Self-pay | Admitting: Internal Medicine

## 2012-10-31 VITALS — BP 133/87 | HR 85 | Temp 97.1°F | Wt 190.3 lb

## 2012-10-31 DIAGNOSIS — E7800 Pure hypercholesterolemia, unspecified: Secondary | ICD-10-CM

## 2012-10-31 DIAGNOSIS — E039 Hypothyroidism, unspecified: Secondary | ICD-10-CM

## 2012-10-31 DIAGNOSIS — H8110 Benign paroxysmal vertigo, unspecified ear: Secondary | ICD-10-CM

## 2012-10-31 DIAGNOSIS — E119 Type 2 diabetes mellitus without complications: Secondary | ICD-10-CM

## 2012-10-31 DIAGNOSIS — K219 Gastro-esophageal reflux disease without esophagitis: Secondary | ICD-10-CM

## 2012-10-31 DIAGNOSIS — I10 Essential (primary) hypertension: Secondary | ICD-10-CM

## 2012-10-31 DIAGNOSIS — Q799 Congenital malformation of musculoskeletal system, unspecified: Secondary | ICD-10-CM | POA: Insufficient documentation

## 2012-10-31 DIAGNOSIS — D649 Anemia, unspecified: Secondary | ICD-10-CM

## 2012-10-31 DIAGNOSIS — Z8781 Personal history of (healed) traumatic fracture: Secondary | ICD-10-CM

## 2012-10-31 DIAGNOSIS — R269 Unspecified abnormalities of gait and mobility: Secondary | ICD-10-CM

## 2012-10-31 DIAGNOSIS — K224 Dyskinesia of esophagus: Secondary | ICD-10-CM

## 2012-10-31 LAB — CBC WITH DIFFERENTIAL/PLATELET
Basophils Absolute: 0 10*3/uL (ref 0.0–0.1)
Eosinophils Relative: 2 % (ref 0–5)
HCT: 34.1 % — ABNORMAL LOW (ref 36.0–46.0)
Lymphocytes Relative: 29 % (ref 12–46)
Lymphs Abs: 1.4 10*3/uL (ref 0.7–4.0)
MCH: 29.4 pg (ref 26.0–34.0)
MCV: 88.8 fL (ref 78.0–100.0)
Monocytes Absolute: 0.4 10*3/uL (ref 0.1–1.0)
RDW: 14.7 % (ref 11.5–15.5)
WBC: 4.9 10*3/uL (ref 4.0–10.5)

## 2012-10-31 LAB — GLUCOSE, CAPILLARY: Glucose-Capillary: 108 mg/dL — ABNORMAL HIGH (ref 70–99)

## 2012-10-31 MED ORDER — HYOSCYAMINE SULFATE 0.125 MG SL SUBL: SUBLINGUAL_TABLET | SUBLINGUAL | Status: DC

## 2012-10-31 NOTE — Assessment & Plan Note (Signed)
CBC:    Component Value Date/Time   WBC 5.9 01/13/2012 0537   HGB 9.0* 01/13/2012 0537   HCT 27.8* 01/13/2012 0537   PLT 253 01/13/2012 0537   MCV 92.7 01/13/2012 0537   NEUTROABS 4.2 01/13/2012 0537   LYMPHSABS 0.8 01/13/2012 0537   MONOABS 0.7 01/13/2012 0537   EOSABS 0.2 01/13/2012 0537   BASOSABS 0.0 01/13/2012 0537   Lab Results  Component Value Date   FERRITIN 17 10/26/2009     Assessment: Patient has no symptoms of anemia.  Plan: Check a CBC with differential and ferritin level today.

## 2012-10-31 NOTE — Assessment & Plan Note (Signed)
Assessment: Patient has a bony prominence at the base of her right thumb.  The exam shows no tenderness, and she has not had any recent injury.  Plan: I advised patient to have her orthopedist Dr. Hilda Lias evaluate this.

## 2012-10-31 NOTE — Assessment & Plan Note (Signed)
Fall Screening 08/21/2012 10/31/2012  Falls in the past year? Yes -  Number of falls in past year 2 or more 2 or more  Was there an injury with Fall? - Yes  Risk Factor Category  - High Fall Risk     Assessment: Progress toward fall prevention goal:  improved Comments: Patient slipped and fell last summer and broke her ankle; she reports one fall while her ankle was healing, but reports no falls for more than six months and she feels that her mobility is fine.  She does not feel that she needs a physical therapy referral to improve balance/gait.  Plan: Fall prevention plans: I advised patient to let me know if she has any further problems with gait or balance. Educational resources provided: brochure Self management tools provided: home fall prevention checklist

## 2012-10-31 NOTE — Assessment & Plan Note (Signed)
Lab Results  Component Value Date   HGBA1C 5.8 08/21/2012   HGBA1C 5.8 04/04/2012   HGBA1C 5.9 11/16/2011     Assessment: Diabetes control: good control (HgbA1C at goal) Progress toward A1C goal:  at goal Comments: Patient's diabetes is controlled on no medications.  Plan: Medications:  continue current dietary management Home glucose monitoring: Frequency: once a day Timing: before breakfast Educational resources provided: brochure Self management tools provided: copy of home glucose meter download

## 2012-10-31 NOTE — Patient Instructions (Signed)
General Instructions: Continue current medications.   Treatment Goals:  Goals (1 Years of Data) as of 10/31/12         As of Today 08/29/12 08/21/12 08/21/12 08/21/12     Blood Pressure    . Blood Pressure < 140/90  133/87 139/88 138/84 137/89 145/87     Lifestyle    . Prevent Falls           Result Component    . HEMOGLOBIN A1C < 7.0    5.8      . LDL CALC < 100    83        Progress Toward Treatment Goals:  Treatment Goal 10/31/2012  Hemoglobin A1C at goal  Blood pressure at goal  Prevent falls improved    Self Care Goals & Plans:  Self Care Goal 10/31/2012  Manage my medications take my medicines as prescribed; bring my medications to every visit; refill my medications on time  Monitor my health bring my glucose meter and log to each visit; keep track of my blood glucose; check my feet daily; keep track of my blood pressure  Eat healthy foods eat baked foods instead of fried foods; eat foods that are low in salt  Be physically active -  Prevent falls use home fall prevention checklist to improve safety    Home Blood Glucose Monitoring 10/31/2012  Check my blood sugar once a day  When to check my blood sugar before breakfast     Care Management & Community Referrals:  Referral 10/31/2012  Referrals made for care management support none needed  Referrals made to community resources none

## 2012-10-31 NOTE — Assessment & Plan Note (Signed)
Assessment: Patient symptoms are controlled on omeprazole 40 mg twice a day.  Plan: The plan is to continue omeprazole at current dose.

## 2012-10-31 NOTE — Assessment & Plan Note (Signed)
Lipids:    Component Value Date/Time   CHOL 179 08/21/2012 1510   TRIG 126 08/21/2012 1510   HDL 71 08/21/2012 1510   LDLCALC 83 08/21/2012 1510   VLDL 25 08/21/2012 1510   CHOLHDL 2.5 08/21/2012 1510    Assessment: LDL is at goal on pravastatin 20 mg daily.  Plan: Continue pravastatin 20 mg daily.

## 2012-10-31 NOTE — Progress Notes (Signed)
  Subjective:    Patient ID: [name] [name], female    DOB: 20-Oct-1945, 67 y.o.   MRN: [id number]  HPI Patient returns for follow-up of her GERD, benign positional vertigo, and other chronic medical problems.  Patient reports that she has been doing well overall, with no acute complaints.  She reports some recurrent pain in her left ankle in the area of her previous fracture, and is scheduled to see her orthopedic surgeon for follow-up of this; she also reports some local intermittent tenderness in her right posterior lateral neck and left neck just under the mandible.  Her vertigo appears to have resolved; she saw an ENT physician, and says that she is scheduled for a hearing test.  Her GERD symptoms are well-controlled when she takes her PPI.  Patient also asked me to examine the base of her right thumb, where the bone has seemed more prominent to her than on the left.  Review of Systems  Constitutional: Negative for fever and chills.  Respiratory: Negative for cough, shortness of breath and wheezing.   Cardiovascular: Negative for chest pain and leg swelling.  Gastrointestinal: Negative for nausea, vomiting and abdominal pain.  Genitourinary: Negative for dysuria.  Musculoskeletal: Positive for arthralgias.       Objective:   Physical Exam  Constitutional: No distress.  Neck: No mass and no thyromegaly present.    Cardiovascular: Normal rate, regular rhythm and normal heart sounds.  Exam reveals no gallop and no friction rub.   No murmur heard. Pulmonary/Chest: Effort normal and breath sounds normal. No respiratory distress. She has no wheezes. She has no rales.  Abdominal: Soft. Bowel sounds are normal. She exhibits no distension. There is no tenderness. There is no rebound and no guarding.  Musculoskeletal: She exhibits no edema.       Hands:      Assessment & Plan:

## 2012-10-31 NOTE — Assessment & Plan Note (Addendum)
Assessment: Patient is status post open reduction internal fixation of a left ankle trimalleolar fracture by Dr. Teola Bradley on 01/10/2012.  She reports some intermittent pain in her left ankle, and has scheduled a follow-up appointment with Dr. Hilda Lias  Plan: Patient will follow up with Dr. Hilda Lias as scheduled.

## 2012-10-31 NOTE — Assessment & Plan Note (Signed)
Assessment: Patient reports good control of her occasional symptoms with Hyomax (hyoscyamine) on an as-needed basis.  Plan: Continue hyoscyamine as needed.

## 2012-10-31 NOTE — Assessment & Plan Note (Signed)
Assessment: Patient's symptoms are essentially resolved.  She saw an ENT physician, and reports that she has a follow-up appointment scheduled for a hearing evaluation.  Plan: Follow up with ENT as scheduled.

## 2012-10-31 NOTE — Assessment & Plan Note (Signed)
BP Readings from Last 3 Encounters:  10/31/12 133/87  08/29/12 139/88  08/21/12 138/84    Lab Results  Component Value Date   NA 138 08/21/2012   K 4.5 08/21/2012   CREATININE 0.72 08/21/2012    Assessment: Blood pressure control: controlled Progress toward BP goal:  at goal Comments: Blood pressure is well controlled on Losartan 50 mg daily.  Plan: Medications:  continue current medications Educational resources provided: brochure;video Self management tools provided: home blood pressure logbook

## 2012-10-31 NOTE — Assessment & Plan Note (Addendum)
TSH  Date Value Range Status  08/21/2012 0.492  0.350 - 4.500 uIU/mL Final    Assessment: Patient's TSH on 08/21/2012 was in the low end of normal range on levothyroxine 112 mcg daily.   Plan: Continue levothyroxine at current dose; recheck TSH today.

## 2012-11-01 ENCOUNTER — Other Ambulatory Visit: Payer: Self-pay | Admitting: Physical Medicine & Rehabilitation

## 2012-11-02 MED ORDER — FERROUS SULFATE 325 (65 FE) MG PO TABS: 325.0000 mg | ORAL_TABLET | Freq: Two times a day (BID) | ORAL | Status: DC

## 2012-11-02 NOTE — Addendum Note (Signed)
Addended by: Margarito Liner on: 11/02/2012 04:17 PM   Modules accepted: Orders

## 2012-11-02 NOTE — Progress Notes (Signed)
Patient ID: [name] [name], female   DOB: 19-Dec-1945, 67 y.o.   MRN: [id number] Patient's ferritin is low, indicating a need for iron supplementation.  Please have her start ferrous sulfate 325 mg two times a day with a meal, and instruct her to take her levothyroxine at least 4 hours apart from her ferrous sulfate.

## 2012-11-05 ENCOUNTER — Telehealth: Payer: Self-pay

## 2012-11-05 MED ORDER — VENLAFAXINE HCL ER 150 MG PO CP24: 150.0000 mg | ORAL_CAPSULE | Freq: Two times a day (BID) | ORAL | Status: DC

## 2012-11-05 NOTE — Telephone Encounter (Signed)
refilled 

## 2012-11-05 NOTE — Telephone Encounter (Signed)
Effexor refill.  Please advise.

## 2012-11-12 ENCOUNTER — Telehealth: Payer: Self-pay | Admitting: Physical Medicine & Rehabilitation

## 2012-11-12 ENCOUNTER — Encounter: Payer: Medicare Other | Attending: Physical Medicine & Rehabilitation | Admitting: Physical Medicine & Rehabilitation

## 2012-11-12 ENCOUNTER — Ambulatory Visit (HOSPITAL_COMMUNITY)
Admission: RE | Admit: 2012-11-12 | Discharge: 2012-11-12 | Disposition: A | Discharge status: Home / Self Care | Payer: Medicare Other | Source: Ambulatory Visit | Attending: Physical Medicine & Rehabilitation | Admitting: Physical Medicine & Rehabilitation

## 2012-11-12 ENCOUNTER — Encounter: Payer: Self-pay | Admitting: Physical Medicine & Rehabilitation

## 2012-11-12 VITALS — BP 142/85 | HR 91 | Resp 17 | Ht 66.0 in | Wt 190.0 lb

## 2012-11-12 DIAGNOSIS — M79609 Pain in unspecified limb: Secondary | ICD-10-CM | POA: Insufficient documentation

## 2012-11-12 DIAGNOSIS — N949 Unspecified condition associated with female genital organs and menstrual cycle: Secondary | ICD-10-CM

## 2012-11-12 DIAGNOSIS — M7520 Bicipital tendinitis, unspecified shoulder: Secondary | ICD-10-CM | POA: Insufficient documentation

## 2012-11-12 DIAGNOSIS — M5412 Radiculopathy, cervical region: Secondary | ICD-10-CM | POA: Insufficient documentation

## 2012-11-12 DIAGNOSIS — M19041 Primary osteoarthritis, right hand: Secondary | ICD-10-CM

## 2012-11-12 DIAGNOSIS — M797 Fibromyalgia: Secondary | ICD-10-CM

## 2012-11-12 DIAGNOSIS — M75101 Unspecified rotator cuff tear or rupture of right shoulder, not specified as traumatic: Secondary | ICD-10-CM

## 2012-11-12 DIAGNOSIS — M7521 Bicipital tendinitis, right shoulder: Secondary | ICD-10-CM | POA: Insufficient documentation

## 2012-11-12 DIAGNOSIS — Z5181 Encounter for therapeutic drug level monitoring: Secondary | ICD-10-CM | POA: Insufficient documentation

## 2012-11-12 DIAGNOSIS — Z79899 Other long term (current) drug therapy: Secondary | ICD-10-CM | POA: Insufficient documentation

## 2012-11-12 DIAGNOSIS — M7989 Other specified soft tissue disorders: Secondary | ICD-10-CM | POA: Insufficient documentation

## 2012-11-12 DIAGNOSIS — M19049 Primary osteoarthritis, unspecified hand: Secondary | ICD-10-CM | POA: Insufficient documentation

## 2012-11-12 DIAGNOSIS — IMO0001 Reserved for inherently not codable concepts without codable children: Secondary | ICD-10-CM | POA: Insufficient documentation

## 2012-11-12 DIAGNOSIS — M67919 Unspecified disorder of synovium and tendon, unspecified shoulder: Secondary | ICD-10-CM | POA: Insufficient documentation

## 2012-11-12 DIAGNOSIS — M719 Bursopathy, unspecified: Secondary | ICD-10-CM | POA: Insufficient documentation

## 2012-11-12 MED ORDER — GABAPENTIN 300 MG PO CAPS: 300.0000 mg | ORAL_CAPSULE | Freq: Three times a day (TID) | ORAL | Status: DC

## 2012-11-12 NOTE — Patient Instructions (Signed)
Ankle Exercises for Rehabilitation Following ankle injuries, it is as important to follow your caregivers instructions for regaining full use of your ankle as it was to follow the initial treatment plan following the injury. The following are some suggestions for exercises and treatment, which can be done to help you regain full use of your ankle as soon as possible.  Follow all instructions regarding physical therapy.  Before exercising, it may be helpful to use heat on the muscles or joint being exercised. This loosens up the muscles and tendons (cord like structure) and decreases chances of injury during your exercises. If this is not possible just begin your exercises slowly to gradually warm up.  Stand on your toes several times per dayto strengthen the calf muscles. These are the muscles in the back of your leg between the knee and the heel. The cord you can feel just above the heel is the Achilles tendon. Rise up on your toes several times repeating this three to four times per day. Do not exercise to the point of pain. If pain starts to develop, decrease the exercise until you are comfortable again.  Do range of motion exercises. This means moving the ankle in all directions. Practice writing the alphabet with your toes in the air. Do not increase beyond a range that is comfortable.  Increase the strength of the muscles in the front of your leg by raising your toes and foot straight up in the air. Repeat this exercise as you did the calf exercise with the same warnings. This also help to stretch your muscles.  Stretch your calf muscles also by leaning against a wall with your hands in front of you. Put your feet a few feet from the wall and bend your knees until you feel the muscles in your calves become tight.  After exercising it may be helpful to put ice on the ankle to prevent swelling and improve rehabilitation. This may be done for 15 to 20 minutes following your exercises. If exercising  is being done in the work place, this may not always be possible.  Taping an ankle injury may be helpful to give added support following an injury. It also may help prevent re-injury. This may be true if you are in training or in a conditioning program. You and your caregiver can decide on the best course of action to follow. Document Released: 06/24/2000 Document Revised: 09/19/2011 Document Reviewed: 06/21/2008 [employer] Patient Information 2013 [employer].  

## 2012-11-12 NOTE — Progress Notes (Signed)
Subjective:    Patient ID: [name] [name], female    DOB: 18-Jun-1946, 67 y.o.   MRN: [id number]  HPI  [name] [name] is back regarding her chronic pain. She states her right shoulder injection just wore off a week or so ago. Until then she was doing quite well. She is having pain now in her right neck with radiation down to her right elbow. She does a lot of holding and carrying of her grandkids who are now 30+ pounds.   She also has knot in her right thumb which has been tender. She dislocated it about 2 weeks ago but she doesn't recall the exact mechanism----it may have been hyperextended.   Her left ankle also feels unstable especially when she gets up in the morning.     Pain Inventory Average Pain 7 Pain Right Now 7 My pain is stabbing  In the last 24 hours, has pain interfered with the following? General activity 7 Relation with others 7 Enjoyment of life 7 What TIME of day is your pain at its worst? morning,night time Sleep (in general) Fair  Pain is worse with: sitting and some activites Pain improves with: rest, medication and injections Relief from Meds: n/a  Mobility use a cane how many minutes can you walk? 30 ability to climb steps?  yes do you drive?  no Do you have any goals in this area?  yes  Function disabled: date disabled 2004 I need assistance with the following:  meal prep, household duties and shopping  Neuro/Psych bladder control problems weakness numbness depression anxiety  Prior Studies Any changes since last visit?  no  Physicians involved in your care Any changes since last visit?  no   Family History  Problem Relation Age of Onset  . Cancer      breast -- grandmother  . Cancer      lung -- uncles (3)  . Leukemia Sister   . Diabetes Father   . Diabetes Mother   . Coronary artery disease Father    History   Social History  . Marital Status: Married    Spouse Name: N/A    Number of Children: N/A  . Years of Education: N/A    Social History Main Topics  . Smoking status: Never Smoker   . Smokeless tobacco: Never Used  . Alcohol Use: No  . Drug Use: No  . Sexually Active: None   Other Topics Concern  . None   Social History Narrative  . None   Past Surgical History  Procedure Laterality Date  . Cholecystectomy    . Abdominal hysterectomy  1984  . Breast surgery    . Cardiac catheterization    . Thyroidectomy    . Neuromas      removed from feet  . Wrist surgery      left  . Orif ankle fracture  01/10/2012    Procedure: OPEN REDUCTION INTERNAL FIXATION (ORIF) ANKLE FRACTURE;  Surgeon: Darreld Mclean, MD;  Location: [facility];  Service: Orthopedics;  Laterality: Left;   Past Medical History  Diagnosis Date  . DM type 2 (diabetes mellitus, type 2)   . TIA (transient ischemic attack) 2010  . Anxiety   . GERD (gastroesophageal reflux disease)   . HTN (hypertension)   . Hypothyroidism   . Depression   . Allergic rhinitis   . Fibromyalgia   . Hemorrhoids   . Migraine headache   . Chest pain     Catheterization 2004 normal coronary  arteries  /  nuclear August 2 007, no ischemia  . Joint pain     Pains in multiple joints  . Shortness of breath     March, 2012  . Elevated alkaline phosphatase level   . Dysphagia   . Hiatal hernia   . Abdominal pain, RUQ (right upper quadrant)   . Hypercholesteremia   . Hematochezia   . UTI (lower urinary tract infection)   . Cervical radiculopathy   . Rotator cuff syndrome of right shoulder   . Chronic neck pain   . Chronic back pain     R > L  . Chronic shoulder pain, right   . Lumbar radicular pain     R>L legs  . History of ankle fracture 01/08/2012    Status post open reduction internal fixation of a left ankle trimalleolar fracture by Dr. Teola Bradley on 01/10/2012.     BP 142/85  Pulse 91  Resp 17  Ht 5\' 6"  (1.676 m)  Wt 190 lb (86.183 kg)  BMI 30.68 kg/m2  SpO2 96%  \  Review of Systems  Constitutional: Positive for fever, chills and  unexpected weight change.  Respiratory: Positive for cough.   Cardiovascular: Positive for leg swelling.  Gastrointestinal: Positive for nausea and constipation.  Genitourinary:       Bladder control problems.  Skin: Positive for rash.  Neurological: Positive for weakness and numbness.  Psychiatric/Behavioral: Positive for dysphoric mood and agitation.  All other systems reviewed and are negative.       Objective:   Physical Exam  Constitutional: She is oriented to person, place, and time. She appears well-developed and well-nourished.  HENT:  Head: Normocephalic and atraumatic.  Eyes: Conjunctivae and EOM are normal. Pupils are equal, round, and reactive to light.  Neck: Normal range of motion.  Cardiovascular: Normal rate and regular rhythm.  Pulmonary/Chest: Effort normal.  Abdominal: Soft. Bowel sounds are normal.  Musculoskeletal:  Patient has mild right rotator cuff impingement signs which were minimal today.  Biceps tendon long head was very tender more so than the small head. .   Left neck has some tenderness with range of motion. Spurling's test is equivocal . She has generalized tenderness over the right hip near the greater trochanter.   Multiple tender points are appreciated today throughout. She had two substantial TP's at the right SCM and trap.   She is tender to palpation over the right first MCP and with resisted flexion of the joint. minmal swelling is seen. She has a nodule at the base of the first prox phalanx. She has tenderness to palpation over the right lumbar parapsinals. These muscles were very taut as well.  Neurological: She is alert and oriented to person, place, and time.  Patient has essentially loss over the C7 distribution. The tendon reflexes are diminished at trace to 1+ throughout both upper extremities however. I did not appreciate any gross motor loss in the upper limbs however today. s. Cognitively she is intact and at baseline.  Assessment &  Plan:   ASSESSMENT:  1. History of fibromyalgia.  2. Chronic low back pain. Myofascial pain.  3. Rotator cuff syndrome./right shoulder pain. 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 Anxiety and depression.  5. Cervical stenosis with left C7 radiculopathy 6. Hx of fall with left trimalleolar ankle fx.    PLAN:  1. Will send her for xrays of the right hand to assess her right first MCP to rule  out fx. This may simply be an injury to her FPB muscle. For now, she will wear her right CTS as much as she can, utilize voltaren gel, ice. Will contact her regarding the xray results as available 2. Continue conservative mgt of her cervical issues. NS is following this patient as well.  3. Continue with neurontin for her general FMS pain. Resumed klonopin for sleep/ hs anxiety  4. Continue with Zanaflex for spasms and back pain. This has been effective as well although it might cause some urine retention, the benefit from her outweighs the negatives in her case.  5. Discussed ROM, strengthening exercises for her  right shoulder. She also needs to be realistic bout activities involving her RUE. After informed consent and preparation of the skin with betadine, I injected 40mg  methylprednisolone and 3cc of 1% lidocaine around the right long head biceps tendon. The patient tolerated well, and no complications were encountered. Afterward the area was cleaned and dressed. Post- injection instructions were provided. Additionally, i injected two TP's in the right  trap each with 2cc of 1% lidocaine. 6. All questions were encouraged and answered. i will see her back in 3 months. I asked her to follow up with her PC regarding thyroid.

## 2012-11-12 NOTE — Addendum Note (Signed)
Addended by: Doreene Eland on: 11/12/2012 02:06 PM   Modules accepted: Orders

## 2012-11-12 NOTE — Telephone Encounter (Signed)
Please let her know her hand xray was negative for fx or dislocation.  thanks

## 2012-11-16 NOTE — Telephone Encounter (Signed)
Patient informed of x-ray results. 

## 2012-12-19 ENCOUNTER — Other Ambulatory Visit: Payer: Self-pay | Admitting: Internal Medicine

## 2012-12-24 ENCOUNTER — Other Ambulatory Visit: Payer: Self-pay | Admitting: Obstetrics & Gynecology

## 2012-12-24 ENCOUNTER — Other Ambulatory Visit (HOSPITAL_COMMUNITY)
Admission: RE | Admit: 2012-12-24 | Discharge: 2012-12-24 | Disposition: A | Discharge status: Home / Self Care | Payer: Medicare Other | Source: Ambulatory Visit | Attending: Orthopaedic Surgery | Admitting: Orthopaedic Surgery

## 2012-12-24 DIAGNOSIS — M19049 Primary osteoarthritis, unspecified hand: Secondary | ICD-10-CM | POA: Insufficient documentation

## 2013-01-16 ENCOUNTER — Other Ambulatory Visit: Payer: Self-pay | Admitting: Obstetrics & Gynecology

## 2013-01-17 ENCOUNTER — Other Ambulatory Visit: Payer: Self-pay

## 2013-01-18 ENCOUNTER — Ambulatory Visit: Payer: Medicare Other

## 2013-01-28 ENCOUNTER — Other Ambulatory Visit: Payer: Self-pay | Admitting: Physical Medicine & Rehabilitation

## 2013-02-08 ENCOUNTER — Other Ambulatory Visit: Payer: Self-pay | Admitting: Internal Medicine

## 2013-02-11 ENCOUNTER — Other Ambulatory Visit: Payer: Self-pay | Admitting: Obstetrics & Gynecology

## 2013-02-12 ENCOUNTER — Encounter: Payer: Self-pay | Admitting: Physical Medicine & Rehabilitation

## 2013-02-12 ENCOUNTER — Encounter: Payer: Medicare Other | Attending: Physical Medicine & Rehabilitation | Admitting: Physical Medicine & Rehabilitation

## 2013-02-12 ENCOUNTER — Ambulatory Visit: Payer: Medicare Other | Admitting: Physical Medicine & Rehabilitation

## 2013-02-12 VITALS — BP 150/79 | HR 84 | Resp 14 | Ht 66.0 in | Wt 196.0 lb

## 2013-02-12 DIAGNOSIS — M19019 Primary osteoarthritis, unspecified shoulder: Secondary | ICD-10-CM

## 2013-02-12 DIAGNOSIS — F3289 Other specified depressive episodes: Secondary | ICD-10-CM | POA: Insufficient documentation

## 2013-02-12 DIAGNOSIS — M76899 Other specified enthesopathies of unspecified lower limb, excluding foot: Secondary | ICD-10-CM | POA: Insufficient documentation

## 2013-02-12 DIAGNOSIS — F329 Major depressive disorder, single episode, unspecified: Secondary | ICD-10-CM

## 2013-02-12 DIAGNOSIS — M545 Low back pain, unspecified: Secondary | ICD-10-CM | POA: Insufficient documentation

## 2013-02-12 DIAGNOSIS — H8110 Benign paroxysmal vertigo, unspecified ear: Secondary | ICD-10-CM

## 2013-02-12 DIAGNOSIS — S8290XS Unspecified fracture of unspecified lower leg, sequela: Secondary | ICD-10-CM

## 2013-02-12 DIAGNOSIS — M19041 Primary osteoarthritis, right hand: Secondary | ICD-10-CM

## 2013-02-12 DIAGNOSIS — Z8781 Personal history of (healed) traumatic fracture: Secondary | ICD-10-CM

## 2013-02-12 DIAGNOSIS — F32A Depression, unspecified: Secondary | ICD-10-CM

## 2013-02-12 DIAGNOSIS — M4712 Other spondylosis with myelopathy, cervical region: Secondary | ICD-10-CM | POA: Insufficient documentation

## 2013-02-12 DIAGNOSIS — M7520 Bicipital tendinitis, unspecified shoulder: Secondary | ICD-10-CM | POA: Insufficient documentation

## 2013-02-12 DIAGNOSIS — M25539 Pain in unspecified wrist: Secondary | ICD-10-CM | POA: Insufficient documentation

## 2013-02-12 DIAGNOSIS — M7062 Trochanteric bursitis, left hip: Secondary | ICD-10-CM | POA: Insufficient documentation

## 2013-02-12 DIAGNOSIS — M67919 Unspecified disorder of synovium and tendon, unspecified shoulder: Secondary | ICD-10-CM | POA: Insufficient documentation

## 2013-02-12 DIAGNOSIS — M19049 Primary osteoarthritis, unspecified hand: Secondary | ICD-10-CM

## 2013-02-12 DIAGNOSIS — M65839 Other synovitis and tenosynovitis, unspecified forearm: Secondary | ICD-10-CM | POA: Insufficient documentation

## 2013-02-12 DIAGNOSIS — G8929 Other chronic pain: Secondary | ICD-10-CM | POA: Insufficient documentation

## 2013-02-12 DIAGNOSIS — M75101 Unspecified rotator cuff tear or rupture of right shoulder, not specified as traumatic: Secondary | ICD-10-CM

## 2013-02-12 DIAGNOSIS — G43009 Migraine without aura, not intractable, without status migrainosus: Secondary | ICD-10-CM

## 2013-02-12 DIAGNOSIS — M719 Bursopathy, unspecified: Secondary | ICD-10-CM | POA: Insufficient documentation

## 2013-02-12 DIAGNOSIS — F411 Generalized anxiety disorder: Secondary | ICD-10-CM | POA: Insufficient documentation

## 2013-02-12 DIAGNOSIS — M4802 Spinal stenosis, cervical region: Secondary | ICD-10-CM | POA: Insufficient documentation

## 2013-02-12 DIAGNOSIS — S82852S Displaced trimalleolar fracture of left lower leg, sequela: Secondary | ICD-10-CM

## 2013-02-12 DIAGNOSIS — M19011 Primary osteoarthritis, right shoulder: Secondary | ICD-10-CM

## 2013-02-12 DIAGNOSIS — IMO0001 Reserved for inherently not codable concepts without codable children: Secondary | ICD-10-CM | POA: Insufficient documentation

## 2013-02-12 MED ORDER — BUPROPION HCL ER (XL) 150 MG PO TB24: ORAL_TABLET | ORAL | Status: DC

## 2013-02-12 MED ORDER — VENLAFAXINE HCL ER 150 MG PO CP24: 150.0000 mg | ORAL_CAPSULE | Freq: Two times a day (BID) | ORAL | Status: DC

## 2013-02-12 MED ORDER — TIZANIDINE HCL 2 MG PO TABS: 1.0000 mg | ORAL_TABLET | Freq: Three times a day (TID) | ORAL | Status: DC | PRN

## 2013-02-12 NOTE — Patient Instructions (Signed)
Right Wrist: use a regular wrist splint during the day and at night. Keep the splint on continuously for one week, then see how your wrist feels. If pain persists, then re-apply the splint continuously for another week. Apply ice three x per day to your wrist.   Trochanteric Bursitis You have hip pain due to trochanteric bursitis. Bursitis means that the sack near the outside of the hip is filled with fluid and inflamed. This sack is made up of protective soft tissue. The pain from trochanteric bursitis can be severe and keep you from sleep. It can radiate to the buttocks or down the outside of the thigh to the knee. The pain is almost always worse when rising from the seated or lying position and with walking. Pain can improve after you take a few steps. It happens more often in people with hip joint and lumbar spine problems, such as arthritis or previous surgery. Very rarely the trochanteric bursa can become infected, and antibiotics and/or surgery may be needed. Treatment often includes an injection of local anesthetic mixed with cortisone medicine. This medicine is injected into the area where it is most tender over the hip. Repeat injections may be necessary if the response to treatment is slow. You can apply ice packs over the tender area for 30 minutes every 2 hours for the next few days. Anti-inflammatory and/or narcotic pain medicine may also be helpful. Limit your activity for the next few days if the pain continues. See your caregiver in 5-10 days if you are not greatly improved.  SEEK IMMEDIATE MEDICAL CARE IF:  You develop severe pain, fever, or increased redness.  You have pain that radiates below the knee. EXERCISES STRETCHING EXERCISES - Trochantic Bursitis  These exercises may help you when beginning to rehabilitate your injury. Your symptoms may resolve with or without further involvement from your physician, physical therapist or athletic trainer. While completing these exercises,  remember:   Restoring tissue flexibility helps normal motion to return to the joints. This allows healthier, less painful movement and activity.  An effective stretch should be held for at least 30 seconds.  A stretch should never be painful. You should only feel a gentle lengthening or release in the stretched tissue. STRETCH  Iliotibial Band  On the floor or bed, lie on your side so your injured leg is on top. Bend your knee and grab your ankle.  Slowly bring your knee back so that your thigh is in line with your trunk. Keep your heel at your buttocks and gently arch your back so your head, shoulders and hips line up.  Slowly lower your leg so that your knee approaches the floor/bed until you feel a gentle stretch on the outside of your thigh. If you do not feel a stretch and your knee will not fall farther, place the heel of your opposite foot on top of your knee and pull your thigh down farther.  Hold this stretch for __________ seconds.  Repeat __________ times. Complete this exercise __________ times per day. STRETCH Hamstrings, Supine   Lie on your back. Loop a belt or towel over the ball of your foot as shown.  Straighten your knee and slowly pull on the belt to raise your injured leg. Do not allow the knee to bend. Keep your opposite leg flat on the floor.  Raise the leg until you feel a gentle stretch behind your knee or thigh. Hold this position for __________ seconds.  Repeat __________ times. Complete this  stretch __________ times per day. STRETCH - Quadriceps, Prone   Lie on your stomach on a firm surface, such as a bed or padded floor.  Bend your knee and grasp your ankle. If you are unable to reach, your ankle or pant leg, use a belt around your foot to lengthen your reach.  Gently pull your heel toward your buttocks. Your knee should not slide out to the side. You should feel a stretch in the front of your thigh and/or knee.  Hold this position for __________  seconds.  Repeat __________ times. Complete this stretch __________ times per day. STRETCHING - Hip Flexors, Lunge Half kneel with your knee on the floor and your opposite knee bent and directly over your ankle.  Keep good posture with your head over your shoulders. Tighten your buttocks to point your tailbone downward; this will prevent your back from arching too much.  You should feel a gentle stretch in the front of your thigh and/or hip. If you do not feel any resistance, slightly slide your opposite foot forward and then slowly lunge forward so your knee once again lines up over your ankle. Be sure your tailbone remains pointed downward.  Hold this stretch for __________ seconds.  Repeat __________ times. Complete this stretch __________ times per day. STRETCH - Adductors, Lunge  While standing, spread your legs  Lean away from your injured leg by bending your opposite knee. You may rest your hands on your thigh for balance.  You should feel a stretch in your inner thigh. Hold for __________ seconds.  Repeat __________ times. Complete this exercise __________ times per day. Document Released: 08/04/2004 Document Revised: 09/19/2011 Document Reviewed: 10/09/2008 [facility] Patient Information 2014 [place], [place].

## 2013-02-12 NOTE — Progress Notes (Signed)
Subjective:    Patient ID: [name] [name], female    DOB: 05-Nov-1945, 67 y.o.   MRN: [id number]  HPI  Khamiya is back regarding her chronic pain issues. She is still complaining of right wrist pain. We xrayed the right wrist in May and the xrays were normal. She has pain along the ulnar side of the wrist and it often extends to the lateral elbow. She has a wrist splint but doesn't use it routinely  She is also having low back pain. She twisted the back and had some swelling. She applied an over the counter product (topical) which has been helpful. The pain was predominantly on the right side of her back.   The left hip has been tender as well. She tends to favor the left leg still from her ankle fx last year.  She is trying to exercise by walking but would like to increase her activity a little more. She is interested in trying yoga. She wants to lose weight but is finding it difficult.  Pain Inventory Average Pain 6 Pain Right Now 5 My pain is dull  In the last 24 hours, has pain interfered with the following? General activity 7 Relation with others 9 Enjoyment of life 4 What TIME of day is your pain at its worst? daytime and evening Sleep (in general) Fair  Pain is worse with: bending, sitting, inactivity and standing Pain improves with: rest, heat/ice, medication and TENS Relief from Meds: no longer taking pain medication  Mobility walk without assistance use a cane how many minutes can you walk? 20 ability to climb steps?  yes do you drive?  yes  Function disabled: date disabled 2004 I need assistance with the following:  household duties and shopping  Neuro/Psych bladder control problems bowel control problems weakness  Prior Studies Any changes since last visit?  yes  Physicians involved in your care Any changes since last visit?  no   Family History  Problem Relation Age of Onset  . Cancer      breast -- grandmother  . Cancer      lung -- uncles (3)  .  Leukemia Sister   . Diabetes Father   . Diabetes Mother   . Coronary artery disease Father    History   Social History  . Marital Status: Married    Spouse Name: N/A    Number of Children: N/A  . Years of Education: N/A   Social History Main Topics  . Smoking status: Never Smoker   . Smokeless tobacco: Never Used  . Alcohol Use: No  . Drug Use: No  . Sexually Active: None   Other Topics Concern  . None   Social History Narrative  . None   Past Surgical History  Procedure Laterality Date  . Cholecystectomy    . Abdominal hysterectomy  1984  . Breast surgery    . Cardiac catheterization    . Thyroidectomy    . Neuromas      removed from feet  . Wrist surgery      left  . Orif ankle fracture  01/10/2012    Procedure: OPEN REDUCTION INTERNAL FIXATION (ORIF) ANKLE FRACTURE;  Surgeon: Darreld Mclean, MD;  Location: [facility];  Service: Orthopedics;  Laterality: Left;   Past Medical History  Diagnosis Date  . DM type 2 (diabetes mellitus, type 2)   . TIA (transient ischemic attack) 2010  . Anxiety   . GERD (gastroesophageal reflux disease)   . HTN (hypertension)   .  Hypothyroidism   . Depression   . Allergic rhinitis   . Fibromyalgia   . Hemorrhoids   . Migraine headache   . Chest pain     Catheterization 2004 normal coronary arteries  /  nuclear August 2 007, no ischemia  . Joint pain     Pains in multiple joints  . Shortness of breath     March, 2012  . Elevated alkaline phosphatase level   . Dysphagia   . Hiatal hernia   . Abdominal pain, RUQ (right upper quadrant)   . Hypercholesteremia   . Hematochezia   . UTI (lower urinary tract infection)   . Cervical radiculopathy   . Rotator cuff syndrome of right shoulder   . Chronic neck pain   . Chronic back pain     R > L  . Chronic shoulder pain, right   . Lumbar radicular pain     R>L legs  . History of ankle fracture 01/08/2012    Status post open reduction internal fixation of a left ankle trimalleolar  fracture by Dr. Teola Bradley on 01/10/2012.     BP 150/79  Pulse 84  Resp 14  Ht 5\' 6"  (1.676 m)  Wt 196 lb (88.905 kg)  BMI 31.65 kg/m2  SpO2 98%     Review of Systems  Constitutional: Positive for chills and diaphoresis.  Respiratory: Positive for cough and shortness of breath.   Cardiovascular: Positive for leg swelling.  Gastrointestinal: Positive for abdominal pain and constipation.       Bowel control   Genitourinary:       Bladder control  Neurological: Positive for weakness.  Psychiatric/Behavioral: Positive for dysphoric mood. The patient is nervous/anxious.   All other systems reviewed and are negative.       Objective:   Physical Exam  Constitutional: She is oriented to person, place, and time. She appears well-developed and well-nourished.  HENT:  Head: Normocephalic and atraumatic.  Eyes: Conjunctivae and EOM are normal. Pupils are equal, round, and reactive to light.  Neck: Normal range of motion.  Cardiovascular: Normal rate and regular rhythm.  Pulmonary/Chest: Effort normal.  Abdominal: Soft. Bowel sounds are normal.  Musculoskeletal:  Patient has mild right rotator cuff impingement signs which were minimal today. Biceps tendon long head was very tender more so than the small head. .   Left neck has some tenderness with range of motion. Spurling's test is equivocal . She has generalized tenderness over the right hip near the greater trochanter.   She is tight around the right trap more than left trap.   Left greater trochanter is tender to palpation and with abduction and crossed leg maneuver.   Lumbar paraspinals are taut on the right. She had mild tenderness there with palpation. She was able to flex to about 45degrees with mild to moderate discomfort.  She is tender to palpation over the right first MCP and with resisted flexion of the joint. minmal swelling is seen. She has a nodule at the base of the first prox phalanx. She has tenderness to  palpation over the right lumbar parapsinals. These muscles were very taut as well.  Neurological: She is alert and oriented to person, place, and time.  Patient has essentially loss over the C7 distribution. The tendon reflexes are diminished at trace to 1+ throughout both upper extremities however. I did not appreciate any gross motor loss in the upper limbs however today. s. Cognitively she is intact and at baseline.  With gait she favors the left leg in weight bearing and is hesitant to shift weight fully to that side.   Assessment & Plan:   ASSESSMENT:  1. History of fibromyalgia.  2. Chronic low back pain. Myofascial pain.  3. Rotator cuff syndrome./right bicipital tendonitis 4. Anxiety and depression.  5. Cervical stenosis with left C7 radiculopathy  6. Hx of fall with left trimalleolar ankle fx.  7. Left greater troch bursitis 8. Left wrist pain, extensor tendonitis   PLAN:  1. Reviewed splinting/immobilization of the right wrist. i would like her to use her wrist splint continuously for one week then go to night time only thereafter. Along the way, she should be applying TID ice to the wrist. She also needs to be sensible about activities.  Consider injections if pain is refractory. 2. Continue conservative mgt of her cervical and lumbar issues. NS is following this patient as well. We discussed posture, basic exercise, rom/strengthening. 3. Continue with neurontin for her general FMS pain. Continue klonopin for sleep/ hs anxiety  4. Continue with Zanaflex for spasms and back pain.   5. Reviewed greater troch stretches and treatment. Consider injection if persistent.  6. Refilled effexor and wellbutrin today 7. All questions were encouraged and answered. i will see her back in 3 months. I asked her to follow up with her PC regarding thyroid. 30 minutes were spent with the patient today.

## 2013-02-13 ENCOUNTER — Ambulatory Visit (INDEPENDENT_AMBULATORY_CARE_PROVIDER_SITE_OTHER): Payer: Medicare Other | Admitting: Internal Medicine

## 2013-02-13 ENCOUNTER — Encounter: Payer: Self-pay | Admitting: Internal Medicine

## 2013-02-13 VITALS — BP 133/79 | HR 86 | Temp 97.7°F | Wt 198.5 lb

## 2013-02-13 DIAGNOSIS — E7800 Pure hypercholesterolemia, unspecified: Secondary | ICD-10-CM

## 2013-02-13 DIAGNOSIS — D649 Anemia, unspecified: Secondary | ICD-10-CM

## 2013-02-13 DIAGNOSIS — E039 Hypothyroidism, unspecified: Secondary | ICD-10-CM

## 2013-02-13 DIAGNOSIS — I10 Essential (primary) hypertension: Secondary | ICD-10-CM

## 2013-02-13 DIAGNOSIS — E119 Type 2 diabetes mellitus without complications: Secondary | ICD-10-CM

## 2013-02-13 DIAGNOSIS — K219 Gastro-esophageal reflux disease without esophagitis: Secondary | ICD-10-CM

## 2013-02-13 LAB — CBC WITH DIFFERENTIAL/PLATELET
Eosinophils Relative: 1 % (ref 0–5)
HCT: 34.7 % — ABNORMAL LOW (ref 36.0–46.0)
Hemoglobin: 11.6 g/dL — ABNORMAL LOW (ref 12.0–15.0)
Lymphocytes Relative: 21 % (ref 12–46)
MCV: 89 fL (ref 78.0–100.0)
Monocytes Absolute: 0.4 10*3/uL (ref 0.1–1.0)
Monocytes Relative: 7 % (ref 3–12)
Neutro Abs: 4.1 10*3/uL (ref 1.7–7.7)
RDW: 13.9 % (ref 11.5–15.5)
WBC: 5.8 10*3/uL (ref 4.0–10.5)

## 2013-02-13 LAB — TSH: TSH: 2.704 u[IU]/mL (ref 0.350–4.500)

## 2013-02-13 LAB — GLUCOSE, CAPILLARY: Glucose-Capillary: 105 mg/dL — ABNORMAL HIGH (ref 70–99)

## 2013-02-13 NOTE — Progress Notes (Signed)
  Subjective:    Patient ID: [name] [name], female    DOB: 11-18-1945, 67 y.o.   MRN: [id number]  HPI Patient returns for followup of her diabetes mellitus, hyperlipidemia, hypothyroidism, and other chronic medical problems.  She has no acute complaints today, and reports that she has been doing well.  She reports that she is compliant with her medications.  Review of Systems  Constitutional: Negative for fever and chills.  Respiratory: Negative for shortness of breath.   Cardiovascular: Negative for chest pain.  Gastrointestinal: Negative for nausea, vomiting and abdominal pain.  Genitourinary: Negative for dysuria.  Musculoskeletal: Positive for arthralgias.       Objective:   Physical Exam  Constitutional: No distress.  Cardiovascular: Normal rate, regular rhythm and normal heart sounds.  Exam reveals no gallop and no friction rub.   No murmur heard. Pulmonary/Chest: Effort normal and breath sounds normal. No respiratory distress. She has no wheezes. She has no rales.  Abdominal: Soft. Bowel sounds are normal. She exhibits no distension. There is no hepatosplenomegaly. There is no tenderness. There is no rebound and no guarding.  Musculoskeletal: She exhibits no edema.       Assessment & Plan:

## 2013-02-13 NOTE — Assessment & Plan Note (Addendum)
Assessment: Patient reflux symptoms are well controlled on omeprazole 40 mg twice a day.  She occasionally takes hyoscyamine (Hyomax-SL) 0.125 mg sublingual as needed for chest pain  Plan: The plan is to continue omeprazole 40 mg twice a day and hyoscyamine as needed.

## 2013-02-13 NOTE — Patient Instructions (Signed)
General Instructions: Please schedule a screening mammogram. Please take your iron supplement) ferrous sulfate) as prescribed.   Treatment Goals:  Goals (1 Years of Data) as of 02/13/13         As of Today 02/12/13 11/12/12 10/31/12 08/29/12     Blood Pressure    . Blood Pressure < 140/90  133/79 150/79 142/85 133/87 139/88     Lifestyle    . Prevent Falls           Result Component    . HEMOGLOBIN A1C < 7.0  5.7        . LDL CALC < 100            Progress Toward Treatment Goals:  Treatment Goal 02/13/2013  Hemoglobin A1C at goal  Blood pressure at goal  Prevent falls -    Self Care Goals & Plans:  Self Care Goal 02/13/2013  Manage my medications take my medicines as prescribed; refill my medications on time; bring my medications to every visit  Monitor my health keep track of my blood glucose; bring my glucose meter and log to each visit; check my feet daily  Eat healthy foods eat foods that are low in salt; eat baked foods instead of fried foods  Be physically active find an activity I enjoy  Prevent falls -    Home Blood Glucose Monitoring 02/13/2013  Check my blood sugar once a day  When to check my blood sugar -     Care Management & Community Referrals:  Referral 02/13/2013  Referrals made for care management support none needed  Referrals made to community resources none

## 2013-02-13 NOTE — Assessment & Plan Note (Addendum)
Lipids:    Component Value Date/Time   CHOL 179 08/21/2012 1510   TRIG 126 08/21/2012 1510   HDL 71 08/21/2012 1510   LDLCALC 83 08/21/2012 1510   VLDL 25 08/21/2012 1510   CHOLHDL 2.5 08/21/2012 1510    Assessment: Patient is doing well on pravastatin 20 mg daily.  Plan: Continue pravastatin 20 mg daily.

## 2013-02-13 NOTE — Assessment & Plan Note (Signed)
TSH  Date Value Range Status  10/31/2012 0.912  0.350 - 4.500 uIU/mL Final    Assessment: Patient's TSH on 10/31/2012 was in the normal range on levothyroxine 112 mcg daily.   Plan: Continue levothyroxine at current dose; recheck TSH today.

## 2013-02-13 NOTE — Assessment & Plan Note (Signed)
Lab Results  Component Value Date   HGB 11.3* 10/31/2012     Lab Results  Component Value Date   FERRITIN 12 10/31/2012     Assessment: Patient has no symptoms of anemia.  Hemoglobin was mildly low when checked in April.  Plan: Check a CBC with differential and ferritin level today.

## 2013-02-13 NOTE — Assessment & Plan Note (Signed)
BP Readings from Last 3 Encounters:  02/13/13 133/79  02/12/13 150/79  11/12/12 142/85    Lab Results  Component Value Date   NA 138 08/21/2012   K 4.5 08/21/2012   CREATININE 0.72 08/21/2012    Assessment: Blood pressure control: controlled Progress toward BP goal:  at goal Comments: Blood pressure is at goal on losartan (Cozaar) 50 mg daily  Plan: Medications:  continue current medications Educational resources provided: brochure;video Self management tools provided: home blood pressure logbook

## 2013-02-13 NOTE — Assessment & Plan Note (Signed)
Lab Results  Component Value Date   HGBA1C 5.7 02/13/2013   HGBA1C 5.8 08/21/2012   HGBA1C 5.8 04/04/2012     Assessment: Diabetes control: good control (HgbA1C at goal) Progress toward A1C goal:  at goal Comments: Patient is doing well with dietary management alone  Plan: Medications:  Continue dietary management Home glucose monitoring: Frequency: once a day Timing: before breakfast Educational resources provided: brochure Self management tools provided: home glucose logbook

## 2013-02-19 ENCOUNTER — Other Ambulatory Visit: Payer: Self-pay | Admitting: Obstetrics & Gynecology

## 2013-02-22 ENCOUNTER — Other Ambulatory Visit: Payer: Self-pay | Admitting: Physical Medicine & Rehabilitation

## 2013-02-25 ENCOUNTER — Telehealth: Payer: Self-pay

## 2013-02-25 MED ORDER — CLONAZEPAM 0.5 MG PO TABS: 0.5000 mg | ORAL_TABLET | Freq: Every evening | ORAL | Status: DC | PRN

## 2013-02-25 NOTE — Telephone Encounter (Signed)
clonazapam called into [facility]

## 2013-02-27 ENCOUNTER — Other Ambulatory Visit: Payer: Self-pay | Admitting: *Deleted

## 2013-02-27 NOTE — Telephone Encounter (Signed)
Please clarify - is this request for test strips?  Patient checks blood sugar once a day, not twice.

## 2013-02-28 NOTE — Telephone Encounter (Signed)
This pt called and stated her meter was not working correctly and she wasn't able to check blood sugars

## 2013-02-28 NOTE — Telephone Encounter (Signed)
See above

## 2013-03-01 MED ORDER — ACCU-CHEK AVIVA PLUS W/DEVICE KIT: 1.0000 | PACK | Freq: Once | Status: DC

## 2013-03-06 ENCOUNTER — Encounter: Payer: Self-pay | Admitting: Internal Medicine

## 2013-03-06 ENCOUNTER — Ambulatory Visit (INDEPENDENT_AMBULATORY_CARE_PROVIDER_SITE_OTHER): Payer: Medicare Other | Admitting: Internal Medicine

## 2013-03-06 VITALS — BP 140/87 | HR 90 | Temp 97.7°F | Ht 66.0 in | Wt 193.9 lb

## 2013-03-06 DIAGNOSIS — R350 Frequency of micturition: Secondary | ICD-10-CM

## 2013-03-06 DIAGNOSIS — M549 Dorsalgia, unspecified: Secondary | ICD-10-CM

## 2013-03-06 DIAGNOSIS — E119 Type 2 diabetes mellitus without complications: Secondary | ICD-10-CM

## 2013-03-06 LAB — GLUCOSE, CAPILLARY: Glucose-Capillary: 127 mg/dL — ABNORMAL HIGH (ref 70–99)

## 2013-03-06 NOTE — Assessment & Plan Note (Addendum)
Pt comes in for an acute visit today due to low blood sugar that has been occuring for about a year.  She states her blood sugar is low because she feels sweaty and a friend told her that was a sign of low blood sugar.  She, however, has not actually measured her bs during these episodes but believes they are caused by low bs.  She was previously a diabetic but her HA1C is currently below goal.  She describes the episodes as "sweating" episodes that have been going on for about a year but have recently gotten worse.  She states these sweating episodes happen once in the morning and once at night and are associated with nausea but no vomiting.  She denies any fever, chills, abdominal pain, changes in bowel habits.  She denies any recent changes in medications.  She does have a h/o anxiety and hypothyroidism.  No abnormalities noted on physical exam.  Possibilities include: hyperthyroidism from too high a dose of synthroid, insulinoma (if her bs was actually low), anxiety, medication side effect.  It is unlikely to be from too much synthroid as her TSH level was recently checked and was nl.  Anxiety is certainly a possibility.  However, I think it more likely that it is related to a side effect from a medication but this would be difficult to sort out which one is the culprit.      -keep f/u appt with Dr. Meredith Pel in November -provided reassurance that this was nothing serious and is common -continue to observe   Lab Results  Component Value Date   HGBA1C 5.7 02/13/2013   Assessment: Diabetes control: good control (HgbA1C at goal) Progress toward A1C goal:  at goal  Plan: Medications:  Continue diet and exercise  Home glucose monitoring: Frequency: once a day Timing: before breakfast Instruction/counseling given: reminded to bring medications to each visit Self management tools provided:  pt to receive new meter Other plans: have pt check bs when she gets episodes of sweating

## 2013-03-06 NOTE — Patient Instructions (Addendum)
Please keep your follow up appointment with Dr. Meredith Pel on 05/22/13  1. We will check your urine for bacteria and I will let you know of the results by Friday

## 2013-03-06 NOTE — Progress Notes (Signed)
Patient ID: [name] [name], female   DOB: 13-Jul-1945, 67 y.o.   MRN: [id number]    Subjective:   Patient ID: [name] [name] female   DOB: 1945-08-26 67 y.o.   MRN: [id number]  HPI: [name] is a 67 y.o. here for an acute visit due to low blood sugars.  She has a PMH of DMII and additional history listed below.  Please see assessment and plan for further details.    Past Medical History  Diagnosis Date  . DM type 2 (diabetes mellitus, type 2)   . TIA (transient ischemic attack) 2010  . Anxiety   . GERD (gastroesophageal reflux disease)   . HTN (hypertension)   . Hypothyroidism   . Depression   . Allergic rhinitis   . Fibromyalgia   . Hemorrhoids   . Migraine headache   . Chest pain     Catheterization 2004 normal coronary arteries  /  nuclear August 2 007, no ischemia  . Joint pain     Pains in multiple joints  . Shortness of breath     March, 2012  . Elevated alkaline phosphatase level   . Dysphagia   . Hiatal hernia   . Abdominal pain, RUQ (right upper quadrant)   . Hypercholesteremia   . Hematochezia   . UTI (lower urinary tract infection)   . Cervical radiculopathy   . Rotator cuff syndrome of right shoulder   . Chronic neck pain   . Chronic back pain     R > L  . Chronic shoulder pain, right   . Lumbar radicular pain     R>L legs  . History of ankle fracture 01/08/2012    Status post open reduction internal fixation of a left ankle trimalleolar fracture by Dr. Teola Bradley on 01/10/2012.     Current Outpatient Prescriptions  Medication Sig Dispense Refill  . albuterol (PROVENTIL HFA;VENTOLIN HFA) 108 (90 BASE) MCG/ACT inhaler Inhale 2 puffs into the lungs every 6 (six) hours as needed. As needed for shortness of breath and coughing.      Marland Kitchen aspirin EC 81 MG tablet Take 81 mg by mouth daily.      . Blood Glucose Monitoring Suppl (ACCU-CHEK AVIVA PLUS) W/DEVICE KIT 1 Device by Does not apply route once. Use to check blood sugar 1 times daily. Dx: ICD-9  250.00. Non-insulin dependent  1 kit  0  . buPROPion (WELLBUTRIN XL) 150 MG 24 hr tablet TAKE 1 TABLET BY MOUTH EVERY DAY  30 tablet  5  . Cholecalciferol (VITAMIN D3) 2000 UNITS TABS Take 1 tablet by mouth daily.      . clonazePAM (KLONOPIN) 0.5 MG tablet Take 1 tablet (0.5 mg total) by mouth at bedtime as needed for anxiety. 90 DAY RX  90 tablet  0  . diclofenac sodium (VOLTAREN) 1 % GEL Apply 4 g topically daily as needed. As needed for pain.  5 Tube  5  . ferrous sulfate 325 (65 FE) MG tablet Take 1 tablet (325 mg total) by mouth 2 (two) times daily with a meal.  60 tablet  3  . fluticasone (FLONASE) 50 MCG/ACT nasal spray Place 2 sprays into the nose daily.  16 g  11  . gabapentin (NEURONTIN) 300 MG capsule Take 1 capsule (300 mg total) by mouth 3 (three) times daily.  90 capsule  5  . hyoscyamine (HYOMAX-SL) 0.125 MG SL tablet Dissolve 1 to 2 tablets under the tongue every 4 (four) hours as  needed for chest pain.  30 tablet  3  . levothyroxine (SYNTHROID, LEVOTHROID) 112 MCG tablet Take 1 tablet (112 mcg total) by mouth daily.  31 tablet  6  . losartan (COZAAR) 25 MG tablet Take 2 tablets (50 mg total) by mouth daily.  180 tablet  2  . omeprazole (PRILOSEC) 40 MG capsule Take 1 capsule (40 mg total) by mouth 2 (two) times daily.  60 capsule  6  . pravastatin (PRAVACHOL) 20 MG tablet Take 1 tablet (20 mg total) by mouth daily.  90 tablet  0  . SUMAtriptan (IMITREX) 50 MG tablet Take 50 mg by mouth every 2 (two) hours as needed. Take one tablet as needed by mouth at onset of headache as directed.      . tetrahydrozoline-zinc (VISINE-AC) 0.05-0.25 % ophthalmic solution Place 2 drops into both eyes 3 (three) times daily as needed. Dry Eyes      . tiZANidine (ZANAFLEX) 2 MG tablet Take 0.5-1 tablets (1-2 mg total) by mouth every 8 (eight) hours as needed.  90 tablet  5  . trimethoprim (TRIMPEX) 100 MG tablet Take 100 mg by mouth daily.       Marland Kitchen venlafaxine XR (EFFEXOR-XR) 150 MG 24 hr capsule Take 1  capsule (150 mg total) by mouth 2 (two) times daily.  60 capsule  5  . zoster vaccine live, PF, (ZOSTAVAX) 57846 UNT/0.65ML injection Inject 19,400 Units into the skin once.  1 each  0   No current facility-administered medications for this visit.   Family History  Problem Relation Age of Onset  . Cancer      breast -- grandmother  . Cancer      lung -- uncles (3)  . Leukemia Sister   . Diabetes Father   . Diabetes Mother   . Coronary artery disease Father    History   Social History  . Marital Status: Married    Spouse Name: N/A    Number of Children: N/A  . Years of Education: N/A   Social History Main Topics  . Smoking status: Never Smoker   . Smokeless tobacco: Never Used  . Alcohol Use: No  . Drug Use: No  . Sexual Activity: None   Other Topics Concern  . None   Social History Narrative  . None   Review of Systems:  Review of Systems  Constitutional: Negative for fever, chills and weight loss.  HENT: Positive for neck pain.   Eyes: Negative for blurred vision.  Respiratory: Negative for cough.   Cardiovascular: Negative for chest pain.  Gastrointestinal: Positive for nausea. Negative for vomiting and abdominal pain.  Genitourinary: Positive for dysuria.    Objective:  Physical Exam: Filed Vitals:   03/06/13 1319  BP: 140/87  Pulse: 90  Temp: 97.7 F (36.5 C)  TempSrc: Oral  Height: 5\' 6"  (1.676 m)  Weight: 193 lb 14.4 oz (87.952 kg)  SpO2: 98%   Physical Exam  Constitutional: She is well-developed, well-nourished, and in no distress.  HENT:  Head: Normocephalic and atraumatic.  Eyes: Conjunctivae and EOM are normal. Pupils are equal, round, and reactive to light.  Neck: Neck supple.  Cardiovascular: Normal rate, regular rhythm, normal heart sounds and intact distal pulses.   Pulmonary/Chest: Effort normal and breath sounds normal.  Abdominal: Soft. Bowel sounds are normal. She exhibits no distension. There is tenderness in the suprapubic  area.  Skin: Skin is warm and dry.  Psychiatric: Her mood appears anxious.   Assessment & Plan:

## 2013-03-07 LAB — URINE CULTURE

## 2013-03-07 LAB — URINALYSIS, ROUTINE W REFLEX MICROSCOPIC
Nitrite: NEGATIVE
Specific Gravity, Urine: 1.011 (ref 1.005–1.030)
Urobilinogen, UA: 0.2 mg/dL (ref 0.0–1.0)
pH: 6 (ref 5.0–8.0)

## 2013-03-07 LAB — URINALYSIS, MICROSCOPIC ONLY
Bacteria, UA: NONE SEEN
Casts: NONE SEEN

## 2013-03-08 ENCOUNTER — Telehealth: Payer: Self-pay | Admitting: Internal Medicine

## 2013-03-08 NOTE — Progress Notes (Signed)
I saw and evaluated the patient.  I personally confirmed the key portions of the history and exam documented by Dr. Gill and I reviewed pertinent patient test results.  The assessment, diagnosis, and plan were formulated together and I agree with the documentation in the resident's note. 

## 2013-03-13 ENCOUNTER — Other Ambulatory Visit: Payer: Self-pay | Admitting: *Deleted

## 2013-03-13 NOTE — Telephone Encounter (Signed)
Pt's insurance will only pay for one touch meters and supplies

## 2013-03-14 ENCOUNTER — Telehealth: Payer: Self-pay | Admitting: *Deleted

## 2013-03-14 NOTE — Telephone Encounter (Signed)
Call to [facility] for Prior Authorization for an Accu-Check [employer].  Information given and information to be sent to Medicare Part D.   Inquries can be made to [phone].  Angelina Ok, RN 03/14/2013 10:58 AM.

## 2013-03-19 ENCOUNTER — Other Ambulatory Visit: Payer: Self-pay | Admitting: Internal Medicine

## 2013-03-21 ENCOUNTER — Other Ambulatory Visit: Payer: Self-pay | Admitting: *Deleted

## 2013-03-21 ENCOUNTER — Other Ambulatory Visit: Payer: Self-pay | Admitting: Internal Medicine

## 2013-03-21 NOTE — Telephone Encounter (Signed)
Call from [facility] pharmacy - pt requesting test strips and lancets. States does not need accu-chek meter. Thanks

## 2013-03-22 MED ORDER — GLUCOSE BLOOD VI STRP: ORAL_STRIP | Status: DC

## 2013-03-22 MED ORDER — ACCU-CHEK SOFTCLIX LANCET DEV MISC: Status: DC

## 2013-03-22 NOTE — Telephone Encounter (Signed)
Responded to in separate request.

## 2013-04-04 ENCOUNTER — Telehealth: Payer: Self-pay | Admitting: *Deleted

## 2013-04-04 DIAGNOSIS — H8110 Benign paroxysmal vertigo, unspecified ear: Secondary | ICD-10-CM

## 2013-04-04 DIAGNOSIS — M19011 Primary osteoarthritis, right shoulder: Secondary | ICD-10-CM

## 2013-04-04 DIAGNOSIS — G43009 Migraine without aura, not intractable, without status migrainosus: Secondary | ICD-10-CM

## 2013-04-04 DIAGNOSIS — IMO0001 Reserved for inherently not codable concepts without codable children: Secondary | ICD-10-CM

## 2013-04-04 NOTE — Telephone Encounter (Signed)
Contacted pt's insurance at [phone] (pt id# [id number]) after receiving faxed request from pt's pharmacy regarding a PA for acc-chek avia test strips.  Per pt's insurance, the preferred meter/strips is one touch. Rx called into pharmacy  one touch ultra meter and strips. Spoke with pt and informed her of the change.  She will call me back directly if she has any questions or problems obtaining the new meter and strips.[name] Spittle Cassady9/25/201410:47 AM

## 2013-04-04 NOTE — Telephone Encounter (Signed)
Call from pt about test strips - which was refilled 9/11 and sent to the pharmacy. I called [facility]' pharm who stated a PA is required. Message given to Alba Cory to f/u.

## 2013-04-12 ENCOUNTER — Telehealth: Payer: Self-pay | Admitting: *Deleted

## 2013-04-12 NOTE — Telephone Encounter (Signed)
Return call to pt as directed to [phone] at 3PM and 4:30PM - recording both times  - it is not recognize as mailbox. Tried [phone] - left message clinic called and will touch base Mon. Pt was calling about CBG 145 to 180. If CBG needs immediate attention - suggest ER. Stanton Kidney Brenton Joines RN 04/12/13 4:40PM

## 2013-04-15 NOTE — Telephone Encounter (Signed)
Tried to reach pt at [phone] to return  Call from 04/12/13 - recording still states it is not a reconized mailbox. Called [phone] - left another message clinic called. 04/15/13 3:45PM

## 2013-04-18 ENCOUNTER — Other Ambulatory Visit: Payer: Self-pay | Admitting: Internal Medicine

## 2013-05-14 ENCOUNTER — Encounter: Payer: Medicare Other | Admitting: Physical Medicine and Rehabilitation

## 2013-05-15 ENCOUNTER — Encounter: Payer: Medicare Other | Admitting: Physical Medicine and Rehabilitation

## 2013-05-22 ENCOUNTER — Encounter: Payer: Medicare Other | Admitting: Internal Medicine

## 2013-05-24 ENCOUNTER — Encounter
Payer: Medicare Other | Attending: Physical Medicine and Rehabilitation | Admitting: Physical Medicine and Rehabilitation

## 2013-05-24 ENCOUNTER — Encounter: Payer: Self-pay | Admitting: Physical Medicine and Rehabilitation

## 2013-05-24 VITALS — BP 149/84 | HR 83 | Resp 14 | Ht 66.0 in | Wt 197.0 lb

## 2013-05-24 DIAGNOSIS — M19011 Primary osteoarthritis, right shoulder: Secondary | ICD-10-CM

## 2013-05-24 DIAGNOSIS — M75101 Unspecified rotator cuff tear or rupture of right shoulder, not specified as traumatic: Secondary | ICD-10-CM

## 2013-05-24 DIAGNOSIS — F3289 Other specified depressive episodes: Secondary | ICD-10-CM | POA: Insufficient documentation

## 2013-05-24 DIAGNOSIS — M797 Fibromyalgia: Secondary | ICD-10-CM

## 2013-05-24 DIAGNOSIS — M545 Low back pain, unspecified: Secondary | ICD-10-CM | POA: Insufficient documentation

## 2013-05-24 DIAGNOSIS — M719 Bursopathy, unspecified: Secondary | ICD-10-CM | POA: Insufficient documentation

## 2013-05-24 DIAGNOSIS — F329 Major depressive disorder, single episode, unspecified: Secondary | ICD-10-CM | POA: Insufficient documentation

## 2013-05-24 DIAGNOSIS — G8929 Other chronic pain: Secondary | ICD-10-CM | POA: Insufficient documentation

## 2013-05-24 DIAGNOSIS — M67919 Unspecified disorder of synovium and tendon, unspecified shoulder: Secondary | ICD-10-CM | POA: Insufficient documentation

## 2013-05-24 DIAGNOSIS — G43009 Migraine without aura, not intractable, without status migrainosus: Secondary | ICD-10-CM

## 2013-05-24 DIAGNOSIS — IMO0001 Reserved for inherently not codable concepts without codable children: Secondary | ICD-10-CM | POA: Insufficient documentation

## 2013-05-24 DIAGNOSIS — M76899 Other specified enthesopathies of unspecified lower limb, excluding foot: Secondary | ICD-10-CM | POA: Insufficient documentation

## 2013-05-24 DIAGNOSIS — M19019 Primary osteoarthritis, unspecified shoulder: Secondary | ICD-10-CM

## 2013-05-24 DIAGNOSIS — M5412 Radiculopathy, cervical region: Secondary | ICD-10-CM | POA: Insufficient documentation

## 2013-05-24 DIAGNOSIS — M65839 Other synovitis and tenosynovitis, unspecified forearm: Secondary | ICD-10-CM | POA: Insufficient documentation

## 2013-05-24 DIAGNOSIS — H8110 Benign paroxysmal vertigo, unspecified ear: Secondary | ICD-10-CM

## 2013-05-24 MED ORDER — DICLOFENAC SODIUM 1 % TD GEL: 4.0000 g | Freq: Every day | TRANSDERMAL | Status: DC | PRN

## 2013-05-24 NOTE — Patient Instructions (Addendum)
Try to walk and exercise regularly Pay attention to your posture , especially when you are sewing.

## 2013-05-24 NOTE — Progress Notes (Signed)
Subjective:    Patient ID: [name] [name], female    DOB: [birth year], 67 y.o.   MRN: [id number]  HPI [name] [name] is back regarding her chronic pain issues. She is still complaining of right wrist pain. We xrayed the right wrist in May and the xrays were normal. She has pain along the ulnar side of the wrist and it often extends to the lateral elbow. She has a wrist splint and is wearing it today. She is also having low back pain, mainly on the right side.   She still has pain from her left ankle fx of last year, especially after walking or being on her feet for a prolonged time.  She is trying to exercise by walking but would like to increase her activity a little more.  She wants to lose weight but is finding it difficult.  Pain Inventory Average Pain 6 Pain Right Now 3 My pain is constant, burning and dull  In the last 24 hours, has pain interfered with the following? General activity 3 Relation with others 3 Enjoyment of life 7 What TIME of day is your pain at its worst? daytime Sleep (in general) Fair  Pain is worse with: bending, sitting and some activites Pain improves with: rest, heat/ice, therapy/exercise, medication, TENS and injections Relief from Meds: 8  Mobility use a cane how many minutes can you walk? 20 ability to climb steps?  yes do you drive?  yes  Function disabled: date disabled 2004 I need assistance with the following:  bathing, meal prep, household duties and shopping  Neuro/Psych bladder control problems bowel control problems No problems in this area numbness tingling dizziness depression anxiety  Prior Studies Any changes since last visit?  no  Physicians involved in your care Any changes since last visit?  no   Family History  Problem Relation Age of Onset  . Cancer      breast -- grandmother  . Cancer      lung -- uncles (3)  . Leukemia Sister   . Diabetes Father   . Diabetes Mother   . Coronary artery disease Father    History    Social History  . Marital Status: Married    Spouse Name: N/A    Number of Children: N/A  . Years of Education: N/A   Social History Main Topics  . Smoking status: Never Smoker   . Smokeless tobacco: Never Used  . Alcohol Use: No  . Drug Use: No  . Sexual Activity: None   Other Topics Concern  . None   Social History Narrative  . None   Past Surgical History  Procedure Laterality Date  . Cholecystectomy    . Abdominal hysterectomy  1984  . Breast surgery    . Cardiac catheterization    . Thyroidectomy    . Neuromas      removed from feet  . Wrist surgery      left  . Orif ankle fracture  01/10/2012    Procedure: OPEN REDUCTION INTERNAL FIXATION (ORIF) ANKLE FRACTURE;  Surgeon: Darreld Mclean, MD;  Location: [facility];  Service: Orthopedics;  Laterality: Left;   Past Medical History  Diagnosis Date  . DM type 2 (diabetes mellitus, type 2)   . TIA (transient ischemic attack) 2010  . Anxiety   . GERD (gastroesophageal reflux disease)   . HTN (hypertension)   . Hypothyroidism   . Depression   . Allergic rhinitis   . Fibromyalgia   . Hemorrhoids   .  Migraine headache   . Chest pain     Catheterization 2004 normal coronary arteries  /  nuclear August 2 007, no ischemia  . Joint pain     Pains in multiple joints  . Shortness of breath     March, 2012  . Elevated alkaline phosphatase level   . Dysphagia   . Hiatal hernia   . Abdominal pain, RUQ (right upper quadrant)   . Hypercholesteremia   . Hematochezia   . UTI (lower urinary tract infection)   . Cervical radiculopathy   . Rotator cuff syndrome of right shoulder   . Chronic neck pain   . Chronic back pain     R > L  . Chronic shoulder pain, right   . Lumbar radicular pain     R>L legs  . History of ankle fracture 01/08/2012    Status post open reduction internal fixation of a left ankle trimalleolar fracture by Dr. Teola Bradley on 01/10/2012.     BP 149/84  Pulse 83  Resp 14  Ht 5\' 6"  (1.676 m)  Wt  197 lb (89.359 kg)  BMI 31.81 kg/m2  SpO2 98%    Review of Systems  Constitutional: Positive for diaphoresis and unexpected weight change.  Respiratory: Positive for cough.   Cardiovascular: Positive for leg swelling.  Gastrointestinal: Positive for nausea, abdominal pain and constipation.  Genitourinary: Positive for difficulty urinating.  Neurological: Positive for dizziness, weakness and numbness.       Tingling  Hematological: Bruises/bleeds easily.  Psychiatric/Behavioral: Positive for dysphoric mood. The patient is nervous/anxious.   All other systems reviewed and are negative.       Objective:   Physical Exam Constitutional: She is oriented to person, place, and time. She appears well-developed and well-nourished.  HENT:  Head: Normocephalic and atraumatic.  Eyes: Conjunctivae and EOM are normal.  Neck: Normal range of motion.Head protracted, cervical spine hyperextended, shoulders protracted worse on the right.  .  Musculoskeletal:  Patient has mild right rotator cuff impingement signs which were minimal today.   Left neck has some tenderness with range of motion. Spurling's test is equivocal .  She is tight around the right trap more than left trap.    Lumbar paraspinals are taut on the right. She had mild tenderness there with palpation. She was able to flex to about 45degrees with mild to moderate discomfort.  She is tender to palpation over the right first MCP and with resisted flexion of the joint. minmal swelling is seen.   Neurological: She is alert and oriented to person, place, and time.  Patient has essentially loss over the C7 distribution. The tendon reflexes are diminished at trace to 1+ throughout both upper extremities however. I did not appreciate any gross motor loss in the upper limbs however today. s. Cognitively she is intact and at baseline.  With gait she favors the right leg in weight bearing and is hesitant to shift weight fully to that side,  she walks with a cane..        Assessment & Plan:  1. History of fibromyalgia.  2. Chronic low back pain. Myofascial pain.  3. Rotator cuff syndrome./right bicipital tendonitis  4. Anxiety and depression.  5. Cervical stenosis with left C7 radiculopathy  6. Hx of fall with left trimalleolar ankle fx.  7. Left greater troch bursitis  8. Right wrist pain, extensor tendonitis  PLAN:  1. Reviewed splinting/immobilization of the right wrist. i would like her to use her  wrist splint continuously for one week then go to night time only thereafter. Along the way, she should be applying TID ice to the wrist, and her Voltaren gel, her pain is inflammatory, which flares up after she is doing her crafts. She also needs to be sensible about activities. Consider injections if pain is refractory.  2. Continue conservative mgt of her cervical and lumbar issues. NS is following this patient as well. We discussed posture, basic exercise, rom/strengthening, again, did a short posture training with her, and explained in which posture she should do her crafts, also told her some principles of being active / doing work in a beneficial posture.  3. Continue with neurontin for her general FMS pain. Continue klonopin for sleep/ hs anxiety  4. Continue with Zanaflex for spasms and back pain.  5.Also discussed the option of joining the silver sneakers program, or just increasing her walking, for physical benefits, but also to get her out of her house, because she is complaining that she stays in her house all the time, which makes her depressed.  All questions were encouraged and answered, back in 3 months.. 25 minutes were spent with the patient today.

## 2013-06-05 ENCOUNTER — Other Ambulatory Visit: Payer: Self-pay | Admitting: Internal Medicine

## 2013-06-05 ENCOUNTER — Other Ambulatory Visit: Payer: Self-pay | Admitting: Physical Medicine & Rehabilitation

## 2013-06-26 ENCOUNTER — Other Ambulatory Visit: Payer: Self-pay | Admitting: Physical Medicine & Rehabilitation

## 2013-06-26 NOTE — Telephone Encounter (Signed)
Klonopin refilled.

## 2013-06-26 NOTE — Telephone Encounter (Signed)
Klonopin refill can be called in with [facility]

## 2013-06-28 ENCOUNTER — Telehealth: Payer: Self-pay | Admitting: *Deleted

## 2013-06-28 NOTE — Telephone Encounter (Signed)
I would rather her use an over-the-counter cold preparation such as Robitussin-DM or generic equivalent if she has not been seen and evaluated.  If her cough is a significant problem, or if she has any associated signs of a serious problem including fever, productive cough, chest pain, shortness of breath, or similar problems, she should be seen and evaluated by a physician.

## 2013-06-28 NOTE — Telephone Encounter (Signed)
Pt called with c/o congested cough for several days.  She has taken tessalon pearles in past and they worked good.  Will you order for her?

## 2013-06-28 NOTE — Telephone Encounter (Signed)
Pt informed and voices understanding 

## 2013-07-01 ENCOUNTER — Other Ambulatory Visit: Payer: Self-pay | Admitting: *Deleted

## 2013-07-01 MED ORDER — ALBUTEROL SULFATE HFA 108 (90 BASE) MCG/ACT IN AERS: 2.0000 | INHALATION_SPRAY | Freq: Four times a day (QID) | RESPIRATORY_TRACT | Status: DC | PRN

## 2013-07-02 NOTE — Telephone Encounter (Signed)
Husband aware.

## 2013-07-10 ENCOUNTER — Other Ambulatory Visit: Payer: Self-pay

## 2013-07-10 MED ORDER — GABAPENTIN 300 MG PO CAPS: ORAL_CAPSULE | ORAL | Status: DC

## 2013-07-12 ENCOUNTER — Other Ambulatory Visit: Payer: Self-pay | Admitting: Physical Medicine & Rehabilitation

## 2013-07-17 ENCOUNTER — Encounter: Payer: Self-pay | Admitting: Internal Medicine

## 2013-07-17 ENCOUNTER — Telehealth: Payer: Self-pay | Admitting: Dietician

## 2013-07-17 ENCOUNTER — Ambulatory Visit (INDEPENDENT_AMBULATORY_CARE_PROVIDER_SITE_OTHER): Payer: Medicare HMO | Admitting: Internal Medicine

## 2013-07-17 VITALS — BP 120/80 | HR 70 | Temp 97.3°F | Ht 66.0 in | Wt 200.3 lb

## 2013-07-17 DIAGNOSIS — Z1239 Encounter for other screening for malignant neoplasm of breast: Secondary | ICD-10-CM

## 2013-07-17 DIAGNOSIS — E119 Type 2 diabetes mellitus without complications: Secondary | ICD-10-CM

## 2013-07-17 DIAGNOSIS — I10 Essential (primary) hypertension: Secondary | ICD-10-CM

## 2013-07-17 DIAGNOSIS — IMO0001 Reserved for inherently not codable concepts without codable children: Secondary | ICD-10-CM

## 2013-07-17 DIAGNOSIS — R32 Unspecified urinary incontinence: Secondary | ICD-10-CM

## 2013-07-17 DIAGNOSIS — E039 Hypothyroidism, unspecified: Secondary | ICD-10-CM

## 2013-07-17 DIAGNOSIS — D649 Anemia, unspecified: Secondary | ICD-10-CM

## 2013-07-17 DIAGNOSIS — E7800 Pure hypercholesterolemia, unspecified: Secondary | ICD-10-CM

## 2013-07-17 LAB — CBC WITH DIFFERENTIAL/PLATELET
BASOS PCT: 0 % (ref 0–1)
Basophils Absolute: 0 10*3/uL (ref 0.0–0.1)
EOS PCT: 2 % (ref 0–5)
Eosinophils Absolute: 0.2 10*3/uL (ref 0.0–0.7)
HEMATOCRIT: 37.9 % (ref 36.0–46.0)
HEMOGLOBIN: 13.1 g/dL (ref 12.0–15.0)
LYMPHS PCT: 24 % (ref 12–46)
Lymphs Abs: 1.6 10*3/uL (ref 0.7–4.0)
MCH: 30.7 pg (ref 26.0–34.0)
MCHC: 34.6 g/dL (ref 30.0–36.0)
MCV: 88.8 fL (ref 78.0–100.0)
MONO ABS: 0.4 10*3/uL (ref 0.1–1.0)
Monocytes Relative: 6 % (ref 3–12)
Neutro Abs: 4.6 10*3/uL (ref 1.7–7.7)
Neutrophils Relative %: 68 % (ref 43–77)
Platelets: 375 10*3/uL (ref 150–400)
RBC: 4.27 MIL/uL (ref 3.87–5.11)
RDW: 13.7 % (ref 11.5–15.5)
WBC: 6.7 10*3/uL (ref 4.0–10.5)

## 2013-07-17 LAB — COMPLETE METABOLIC PANEL WITH GFR
ALBUMIN: 4.6 g/dL (ref 3.5–5.2)
ALT: 20 U/L (ref 0–35)
AST: 16 U/L (ref 0–37)
Alkaline Phosphatase: 110 U/L (ref 39–117)
BUN: 12 mg/dL (ref 6–23)
CO2: 27 mEq/L (ref 19–32)
Calcium: 9.4 mg/dL (ref 8.4–10.5)
Chloride: 100 mEq/L (ref 96–112)
Creat: 0.69 mg/dL (ref 0.50–1.10)
GFR, Est African American: >89 mL/min
GLUCOSE: 80 mg/dL (ref 70–99)
POTASSIUM: 4.2 meq/L (ref 3.5–5.3)
SODIUM: 139 meq/L (ref 135–145)
TOTAL PROTEIN: 7 g/dL (ref 6.0–8.3)
Total Bilirubin: 0.3 mg/dL (ref 0.3–1.2)

## 2013-07-17 LAB — TSH: TSH: 9.799 u[IU]/mL — AB (ref 0.350–4.500)

## 2013-07-17 LAB — GLUCOSE, CAPILLARY: GLUCOSE-CAPILLARY: 96 mg/dL (ref 70–99)

## 2013-07-17 LAB — POCT GLYCOSYLATED HEMOGLOBIN (HGB A1C): HEMOGLOBIN A1C: 5.6

## 2013-07-17 LAB — FERRITIN: Ferritin: 112 ng/mL (ref 10–291)

## 2013-07-17 NOTE — Assessment & Plan Note (Signed)
Assessment: Patient's fibromyalgia/chronic pain appears to be well controlled on her current regimen, which is managed at the pain clinic.  Plan: Patient will followup at the pain clinic as scheduled.

## 2013-07-17 NOTE — Assessment & Plan Note (Signed)
BP Readings from Last 3 Encounters:  07/17/13 120/80  05/24/13 149/84  03/06/13 140/87    Lab Results  Component Value Date   NA 138 08/21/2012   K 4.5 08/21/2012   CREATININE 0.72 08/21/2012    Assessment: Blood pressure control: controlled Progress toward BP goal:  at goal Comments: Patient's blood pressure is well controlled on losartan 50 mg daily.  Plan: Medications:  continue current medication

## 2013-07-17 NOTE — Assessment & Plan Note (Signed)
Assessment: Patient has been seen by Dr. Matilde Sprang of [facility] and I advised her to follow up there.  She reports some increase in urinary urgency over the past one to 2 weeks.  Plan: I advised patient to followup with Dr. Matilde Sprang.  Given the increased urgency, will check a urinalysis and urine culture today.

## 2013-07-17 NOTE — Progress Notes (Signed)
   Subjective:    Patient ID: [name] [name], female    DOB: 08-27-1945, 68 y.o.   MRN: [id number]  HPI Patient returns for followup of her diet-controlled diabetes mellitus, hypertension, and other medical problems.  Overall she is doing well; she reports that she is compliant with her medications.  She is followed in the pain clinic here for management of her fibromyalgia/chronic pain.  She reports occasional nausea but denies any vomiting, abdominal pain, or other associated symptoms.  She does have chronic low back pain which is stable.  She has urinary urgency and incontinence, for which she is followed by a urologist; she reports some recent increase in the urgency.   Review of Systems  Constitutional: Negative for fever and chills.  Respiratory: Negative for shortness of breath and wheezing.   Cardiovascular: Negative for chest pain.  Gastrointestinal: Positive for nausea (Occasional nausea, not severe). Negative for vomiting, abdominal pain, diarrhea, blood in stool and anal bleeding.  Genitourinary: Positive for urgency. Negative for dysuria and frequency.  Musculoskeletal: Positive for back pain (Chronic).       Objective:   Physical Exam  Constitutional: No distress.  Cardiovascular: Normal rate and regular rhythm.  Exam reveals no gallop and no friction rub.   No murmur heard. Pulmonary/Chest: Effort normal and breath sounds normal. No respiratory distress. She has no wheezes. She has no rales.  Abdominal: Soft. Bowel sounds are normal. She exhibits no distension. There is tenderness (Mild suprapubic tenderness). There is no rebound and no guarding.  Musculoskeletal: She exhibits no edema.         Assessment & Plan:

## 2013-07-17 NOTE — Patient Instructions (Addendum)
General Instructions: Please schedule an appointment with your eye doctor for your annual eye exam. An appointment has been requested for a screening mammogram.   Treatment Goals:  Goals (1 Years of Data) as of 07/17/13         As of Today As of Today 05/24/13 03/06/13 02/13/13     Blood Pressure    . Blood Pressure < 140/90  120/80 149/84 149/84 140/87 133/79     Lifestyle    . Prevent Falls           Result Component    . HEMOGLOBIN A1C < 7.0  5.6    5.7    . LDL CALC < 100            Progress Toward Treatment Goals:  Treatment Goal 07/17/2013  Hemoglobin A1C at goal  Blood pressure at goal  Prevent falls -    Self Care Goals & Plans:  Self Care Goal 07/17/2013  Manage my medications -  Monitor my health -  Eat healthy foods -  Be physically active -  Prevent falls -  Meeting treatment goals maintain the current self-care plan    Home Blood Glucose Monitoring 07/17/2013  Check my blood sugar once a day  When to check my blood sugar before breakfast     Care Management & Community Referrals:  Referral 07/17/2013  Referrals made for care management support none needed  Referrals made to community resources none

## 2013-07-17 NOTE — Assessment & Plan Note (Signed)
Lipids:    Component Value Date/Time   CHOL 179 08/21/2012 1510   TRIG 126 08/21/2012 1510   HDL 71 08/21/2012 1510   LDLCALC 83 08/21/2012 1510   VLDL 25 08/21/2012 1510   CHOLHDL 2.5 08/21/2012 1510    Assessment: Patient is doing well on pravastatin 20 mg daily, with no apparent side effects  Plan: Check a metabolic panel and lipid panel; continue pravastatin 20 mg daily pending those results.

## 2013-07-17 NOTE — Assessment & Plan Note (Signed)
Hemoglobin  Date Value Range Status  02/13/2013 11.6* 12.0 - 15.0 g/dL Final     Ferritin  Date Value Range Status  02/13/2013 35  10 - 291 ng/mL Final     Assessment: Patient has no symptoms of anemia.  Hemoglobin was mildly low when checked in August, and ferritin was low.  Plan: Check a CBC with differential and ferritin level today; continue ferrous sulfate pending those results.

## 2013-07-17 NOTE — Assessment & Plan Note (Signed)
TSH  Date Value Range Status  02/13/2013 2.704  0.350 - 4.500 uIU/mL Final    Assessment: Patient's TSH on 02/13/2013 was in the normal range on levothyroxine 112 mcg daily.   Plan: Continue levothyroxine at current dose; check TSH today.

## 2013-07-17 NOTE — Telephone Encounter (Signed)
Called Dr. Zigmund Daniel office and we have the report from her last appointment with them.  Called patient to see if she had followed up with her regular doctor per their instructions.   Patient says she does not have a regular eye doctor and has not had an eye exam since seeing Dr. Zigmund Daniel. She would appreciate a referral to one. She said any day of the week is fine and 11 am or after would be the best time of day for her to make an appointment. I told her we would be in touch with appointment information.

## 2013-07-17 NOTE — Assessment & Plan Note (Signed)
Lab Results  Component Value Date   HGBA1C 5.6 07/17/2013   HGBA1C 5.7 02/13/2013   HGBA1C 5.8 08/21/2012     Assessment: Diabetes control: good control (HgbA1C at goal) Progress toward A1C goal:  at goal Comments: Patient's diabetes is well controlled on dietary management alone.  Plan: Medications:  Continue dietary management Home glucose monitoring: Frequency: once a day Timing: before breakfast Instruction/counseling given: reminded to get eye exam

## 2013-07-18 LAB — URINALYSIS, MICROSCOPIC ONLY
Bacteria, UA: NONE SEEN
CRYSTALS: NONE SEEN
Casts: NONE SEEN
Squamous Epithelial / LPF: NONE SEEN

## 2013-07-18 LAB — URINALYSIS, ROUTINE W REFLEX MICROSCOPIC
Bilirubin Urine: NEGATIVE
GLUCOSE, UA: NEGATIVE mg/dL
Ketones, ur: NEGATIVE mg/dL
Nitrite: NEGATIVE
PH: 7 (ref 5.0–8.0)
Protein, ur: NEGATIVE mg/dL
Specific Gravity, Urine: 1.005 (ref 1.005–1.030)
UROBILINOGEN UA: 0.2 mg/dL (ref 0.0–1.0)

## 2013-07-18 LAB — URINE CULTURE
COLONY COUNT: NO GROWTH
Organism ID, Bacteria: NO GROWTH

## 2013-07-19 ENCOUNTER — Telehealth: Payer: Self-pay | Admitting: Internal Medicine

## 2013-07-19 DIAGNOSIS — D649 Anemia, unspecified: Secondary | ICD-10-CM

## 2013-07-19 DIAGNOSIS — E039 Hypothyroidism, unspecified: Secondary | ICD-10-CM

## 2013-07-19 MED ORDER — LEVOTHYROXINE SODIUM 125 MCG PO TABS: 125.0000 ug | ORAL_TABLET | Freq: Every day | ORAL | Status: DC

## 2013-07-19 NOTE — Telephone Encounter (Signed)
TSH  Date Value Range Status  07/17/2013 9.799* 0.350 - 4.500 uIU/mL Final   TSH is 9.799 on a levothyroxine dose of 112 mcg daily.  The plan is to increase to a dose of 125 mcg daily.  I called patient and gave her the instructions to change to the 125 mcg tablet, and I sent the prescription to her pharmacy.  Will recheck a TSH at her next visit in 2-3 months.  Ferritin  Date Value Range Status  07/17/2013 112  10 - 291 ng/mL Final   Ferritin is 112, which indicates adequate iron replacement.  I instructed patient by phone to stop ferrous sulfate.

## 2013-07-19 NOTE — Assessment & Plan Note (Signed)
Telephone Contact Note  Hemoglobin  Date Value Range Status  07/17/2013 13.1  12.0 - 15.0 g/dL Final     Ferritin  Date Value Range Status  07/17/2013 112  10 - 291 ng/mL Final     Ferritin is 112, which indicates adequate iron replacement.  I instructed patient by phone to stop ferrous sulfate.

## 2013-07-19 NOTE — Assessment & Plan Note (Addendum)
Telephone Contact Note  TSH  Date Value Range Status  07/17/2013 9.799* 0.350 - 4.500 uIU/mL Final   TSH is 9.799 on a levothyroxine dose of 112 mcg daily.  The plan is to increase to a dose of 125 mcg daily.  I called patient and gave her the instructions to change to the 125 mcg tablet, and I sent the prescription to her pharmacy.  Will recheck a TSH at her next visit in 2-3 months.

## 2013-07-23 ENCOUNTER — Ambulatory Visit (HOSPITAL_COMMUNITY): Payer: Medicare HMO

## 2013-08-15 ENCOUNTER — Telehealth: Payer: Self-pay | Admitting: *Deleted

## 2013-08-15 NOTE — Telephone Encounter (Signed)
Pt calls with c/o headaches every day, between eyes and dizziness.  Onset a few weeks ago.  Pt taking Naproxen 2 two times a day, with some relief. She denies nausea or visual problems. Headache during day is mild but increases at night.  Dizziness is on and off with turning or getting up.  She is drinking water and other fluids. She has a history of headache about a year ago but they resolved.

## 2013-08-15 NOTE — Telephone Encounter (Signed)
Please schedule an appointment in clinic as soon as possible.  If her headache worsens, or if she develops any concerning symptoms, she should go to the ED.

## 2013-08-15 NOTE — Telephone Encounter (Signed)
I talked with Dr Marinda Elk and he wants pt seen in clinic.  I offered an appointment tomorrow but pt wants to wait until Monday.  She will go to [place] if any changes in sx., fever, increase headache etc.

## 2013-08-19 ENCOUNTER — Encounter: Payer: Self-pay | Admitting: Internal Medicine

## 2013-08-19 ENCOUNTER — Ambulatory Visit (INDEPENDENT_AMBULATORY_CARE_PROVIDER_SITE_OTHER): Payer: Medicare HMO | Admitting: Internal Medicine

## 2013-08-19 VITALS — BP 139/84 | HR 75 | Temp 98.4°F | Ht 66.0 in | Wt 199.2 lb

## 2013-08-19 DIAGNOSIS — G4489 Other headache syndrome: Secondary | ICD-10-CM | POA: Insufficient documentation

## 2013-08-19 DIAGNOSIS — R51 Headache: Secondary | ICD-10-CM

## 2013-08-19 DIAGNOSIS — E119 Type 2 diabetes mellitus without complications: Secondary | ICD-10-CM

## 2013-08-19 DIAGNOSIS — G44209 Tension-type headache, unspecified, not intractable: Secondary | ICD-10-CM | POA: Insufficient documentation

## 2013-08-19 DIAGNOSIS — E039 Hypothyroidism, unspecified: Secondary | ICD-10-CM

## 2013-08-19 DIAGNOSIS — Z0000 Encounter for general adult medical examination without abnormal findings: Secondary | ICD-10-CM | POA: Insufficient documentation

## 2013-08-19 DIAGNOSIS — I10 Essential (primary) hypertension: Secondary | ICD-10-CM

## 2013-08-19 DIAGNOSIS — Z23 Encounter for immunization: Secondary | ICD-10-CM

## 2013-08-19 LAB — GLUCOSE, CAPILLARY: Glucose-Capillary: 98 mg/dL (ref 70–99)

## 2013-08-19 NOTE — Progress Notes (Signed)
Patient ID: [name] [name], female   DOB: 05-26-1946, 68 y.o.   MRN: [id number] Subjective:   Patient ID: [name] [name] female   DOB: 1945-08-01 68 y.o.   MRN: [id number]  CC:   Follow up visit.     HPI:  [name] [name] is a 68 y.o. lady with past medical history as outlined below, who presents for a followup visit today  1. Hypothyroidism: TSH was 7.799 on 07/17/13. The Synthroid dose was increased to 125 mcg on 07/19/16 by PCP. She is complaint to her medications.  2. HTN: bp is 139/84 today. She does not have chest pain, shortness of breath, or leg edema.   3. DM-II: A1C 5.6 on 07/17/13.  She does not have symptoms for hyper- or hypoglycemia.  4. Headache: she reports that she has been having chronic moderate headache in the past 3 months. Her headache is located in the frontal area, sometimes in the neck. Headache is not aggravated by exertion. No aura. Sometimes it is associated with mild nausea, but not vomiting. He does not have alarming symptoms, such as weakness, numbness or blurry vision. She states that she responds to Tylenol with good relief for HA. She also reports that recently she has a lot of stress. Her son-in-law left her daughter and two grandchildren. She also has financial difficulties which also makes her feel very stressful.   ROS:  Denies fever, chills, fatigue, cough, chest pain, SOB, abdominal pain, diarrhea, constipation, dysuria, urgency, frequency, hematuria or leg swelling.  Past Medical History  Diagnosis Date  . DM type 2 (diabetes mellitus, type 2)   . TIA (transient ischemic attack) 2010  . Anxiety   . GERD (gastroesophageal reflux disease)   . HTN (hypertension)   . Hypothyroidism   . Depression   . Allergic rhinitis   . Fibromyalgia   . Hemorrhoids   . Migraine headache   . Chest pain     Catheterization 2004 normal coronary arteries  /  nuclear August 2 007, no ischemia  . Joint pain     Pains in multiple joints  . Shortness of breath     March, 2012   . Elevated alkaline phosphatase level   . Dysphagia   . Hiatal hernia   . Abdominal pain, RUQ (right upper quadrant)   . Hypercholesteremia   . Hematochezia   . UTI (lower urinary tract infection)   . Cervical radiculopathy   . Rotator cuff syndrome of right shoulder   . Chronic neck pain   . Chronic back pain     R > L  . Chronic shoulder pain, right   . Lumbar radicular pain     R>L legs  . History of ankle fracture 01/08/2012    Status post open reduction internal fixation of a left ankle trimalleolar fracture by Dr. Iona Hansen on 01/10/2012.     Current Outpatient Prescriptions  Medication Sig Dispense Refill  . albuterol (PROVENTIL HFA;VENTOLIN HFA) 108 (90 BASE) MCG/ACT inhaler Inhale 2 puffs into the lungs every 6 (six) hours as needed for wheezing or shortness of breath.  1 Inhaler  6  . aspirin EC 81 MG tablet Take 81 mg by mouth daily.      . Blood Glucose Monitoring Suppl (ONE TOUCH ULTRA SYSTEM KIT) W/DEVICE KIT 1 kit by Does not apply route once. Use to test blood glucose one time daily. Dx:250.00      . buPROPion (WELLBUTRIN XL) 150 MG 24 hr tablet TAKE  1 TABLET BY MOUTH EVERY DAY  30 tablet  5  . Cholecalciferol (VITAMIN D3) 2000 UNITS TABS Take 1 tablet by mouth daily.      . clonazePAM (KLONOPIN) 0.5 MG tablet TAKE 1 TABLET BY MOUTH EVERY NIGHT AT BEDTIME AS NEEDED FOR ANXIETY  90 tablet  1  . diclofenac sodium (VOLTAREN) 1 % GEL Apply 4 g topically daily as needed. As needed for pain.  5 Tube  5  . fluticasone (FLONASE) 50 MCG/ACT nasal spray Place 2 sprays into the nose daily.  16 g  11  . gabapentin (NEURONTIN) 300 MG capsule TAKE 1 CAPSULE BY MOUTH THREE TIMES DAILY  90 capsule  3  . glucose blood (ONE TOUCH TEST STRIPS) test strip 1 each by Other route daily. Use to test blood glucose one time daily. Dx:250.00      . hyoscyamine (HYOMAX-SL) 0.125 MG SL tablet Dissolve 1 to 2 tablets under the tongue every 4 (four) hours as needed for chest pain.  30 tablet  3   . Lancet Devices (ACCU-CHEK SOFTCLIX) lancets Use to check blood sugars once daily. Dx code: 250.00.  100 each  3  . levothyroxine (SYNTHROID, LEVOTHROID) 125 MCG tablet Take 1 tablet (125 mcg total) by mouth daily.  31 tablet  6  . losartan (COZAAR) 25 MG tablet TAKE 2 TABLETS BY MOUTH EVERY DAY  180 tablet  3  . magnesium oxide (MAG-OX) 400 MG tablet Take 400 mg by mouth daily.      Marland Kitchen omeprazole (PRILOSEC) 40 MG capsule TAKE ONE CAPSULE BY MOUTH TWICE DAILY  60 capsule  11  . pravastatin (PRAVACHOL) 20 MG tablet TAKE 1 TABLET BY MOUTH EVERY DAY  90 tablet  2  . SUMAtriptan (IMITREX) 50 MG tablet Take 50 mg by mouth every 2 (two) hours as needed. Take one tablet as needed by mouth at onset of headache as directed.      . tetrahydrozoline-zinc (VISINE-AC) 0.05-0.25 % ophthalmic solution Place 2 drops into both eyes 3 (three) times daily as needed. Dry Eyes      . tiZANidine (ZANAFLEX) 2 MG tablet Take 1/2 to 1 tablet every 8 hours as needed      . trimethoprim (TRIMPEX) 100 MG tablet Take 100 mg by mouth daily.       Marland Kitchen venlafaxine XR (EFFEXOR-XR) 150 MG 24 hr capsule Take 1 capsule (150 mg total) by mouth 2 (two) times daily.  60 capsule  5  . zoster vaccine live, PF, (ZOSTAVAX) 93790 UNT/0.65ML injection Inject 19,400 Units into the skin once.  1 each  0   No current facility-administered medications for this visit.   Family History  Problem Relation Age of Onset  . Cancer      breast -- grandmother  . Cancer      lung -- uncles (3)  . Leukemia Sister   . Diabetes Father   . Diabetes Mother   . Coronary artery disease Father    History   Social History  . Marital Status: Married    Spouse Name: N/A    Number of Children: N/A  . Years of Education: N/A   Social History Main Topics  . Smoking status: Never Smoker   . Smokeless tobacco: Never Used  . Alcohol Use: No  . Drug Use: No  . Sexual Activity: None   Other Topics Concern  . None   Social History Narrative  . None     Review of Systems: Full 14-point  review of systems otherwise negative. See HPI.   Objective:  Physical Exam: Filed Vitals:   08/19/13 0956  BP: 139/84  Pulse: 75  Temp: 98.4 F (36.9 C)  TempSrc: Oral  Height: _0  (1.676 m)  Weight: 199 lb 3.2 oz (90.357 kg)  SpO2: 97%   Constitutional: Vital signs reviewed.  Patient is a well-developed and well-nourished, in no acute distress and cooperative with exam.   HEENT:  Head: Normocephalic and atraumatic Mouth: no erythema or exudates, MMM Eyes: PERRL, EOMI, conjunctivae normal, No scleral icterus.  Neck: Supple, Trachea midline normal ROM, No JVD  Cardiovascular: RRR, S1 normal, S2 normal, no MRG, pulses symmetric and intact bilaterally Pulmonary/Chest: CTAB, no wheezes, rales, or rhonchi Abdominal: Soft. Non-tender, non-distended, bowel sounds are normal, no masses, organomegaly, or guarding present.  GU: no CVA tenderness Musculoskeletal: No joint deformities, erythema, or stiffness, ROM full and non-tender Extremities: No leg edema Hematology: no cervical, inginal, or axillary adenopathy.  Neurological: A&O x3, Strength is normal and symmetric bilaterally, cranial nerve II-XII are grossly intact, no focal motor deficit, sensory intact to light touch bilaterally. Brachial reflex 2+ bilaterally. Knee reflex 2+ bilaterally. Babinski's sign negative. Finger to nose test normal. Skin: Warm, dry and intact. No rash, cyanosis, or clubbing.  Psychiatric: Normal mood and affect. No suicidal or homicidal ideation.  Assessment & Plan:   \

## 2013-08-19 NOTE — Patient Instructions (Signed)
1. Please take tylenol or ibuprofen for your headache.  2. Please take all medications as prescribed.  3. If you have worsening of your symptoms or new symptoms arise, please call the clinic ([phone]), or go to the ER immediately if symptoms are severe.  You have done great job in taking all your medications. I appreciate it very much. Please continue doing that.  Please bring in all your medication bottles with you in next visit.

## 2013-08-19 NOTE — Assessment & Plan Note (Signed)
BP Readings from Last 3 Encounters:  08/19/13 139/84  07/17/13 120/80  05/24/13 149/84    Lab Results  Component Value Date   NA 139 07/17/2013   K 4.2 07/17/2013   CREATININE 0.69 07/17/2013    Assessment: Blood pressure control: controlled Progress toward BP goal:  at goal Comments:   Plan: Medications:  continue current medications Educational resources provided: brochure Self management tools provided:   Other plans: blood pressure is well controlled. We'll continue current regimen, Cozaar 25 mg daily.

## 2013-08-19 NOTE — Assessment & Plan Note (Signed)
Lab Results  Component Value Date   HGBA1C 5.6 07/17/2013   HGBA1C 5.7 02/13/2013   HGBA1C 5.8 08/21/2012     Assessment: Diabetes control: good control (HgbA1C at goal) Progress toward A1C goal:  at goal Comments:   Plan: Medications:  continue current medications Home glucose monitoring: Frequency:   Timing:   Instruction/counseling given: discussed diet Educational resources provided: brochure Self management tools provided:   Other plans: her diabetes is well controlled on dietary management alone. No change or regimen.

## 2013-08-19 NOTE — Assessment & Plan Note (Signed)
-  will take retinal picture -will give pneumococcal vaccination shot -patient had an appointment for mammogram, but missed the appointment, she will call for another appointment by herself -pt had flu shot in last September or October in pharmacy per patient.

## 2013-08-19 NOTE — Assessment & Plan Note (Signed)
TSH was 7.799 on 07/17/13. The Synthroid dose was increased to 125 mcg on 07/19/16 by PCP. She is complaint to her medications. Will continue current regimen.

## 2013-08-19 NOTE — Assessment & Plan Note (Signed)
The nature of her headache is consistent with tension headache, which has been aggravated by the stress. She does not have alarming symptoms, such as blurry vision, weakness or numbness in extremities. She is responding to the Tylenol treatment.  -will continue tylenol for HA -discussed with the patient about how to cope with stress.

## 2013-08-20 ENCOUNTER — Encounter: Payer: Medicare HMO | Attending: Physical Medicine and Rehabilitation | Admitting: Physical Medicine & Rehabilitation

## 2013-08-20 ENCOUNTER — Encounter: Payer: Self-pay | Admitting: Physical Medicine & Rehabilitation

## 2013-08-20 VITALS — BP 149/84 | HR 88 | Resp 14 | Ht 66.0 in | Wt 195.0 lb

## 2013-08-20 DIAGNOSIS — M67919 Unspecified disorder of synovium and tendon, unspecified shoulder: Secondary | ICD-10-CM

## 2013-08-20 DIAGNOSIS — M19011 Primary osteoarthritis, right shoulder: Secondary | ICD-10-CM

## 2013-08-20 DIAGNOSIS — M19049 Primary osteoarthritis, unspecified hand: Secondary | ICD-10-CM

## 2013-08-20 DIAGNOSIS — M75100 Unspecified rotator cuff tear or rupture of unspecified shoulder, not specified as traumatic: Secondary | ICD-10-CM

## 2013-08-20 DIAGNOSIS — IMO0001 Reserved for inherently not codable concepts without codable children: Secondary | ICD-10-CM | POA: Insufficient documentation

## 2013-08-20 DIAGNOSIS — M47817 Spondylosis without myelopathy or radiculopathy, lumbosacral region: Secondary | ICD-10-CM | POA: Insufficient documentation

## 2013-08-20 DIAGNOSIS — M19041 Primary osteoarthritis, right hand: Secondary | ICD-10-CM

## 2013-08-20 DIAGNOSIS — M719 Bursopathy, unspecified: Secondary | ICD-10-CM

## 2013-08-20 DIAGNOSIS — M19019 Primary osteoarthritis, unspecified shoulder: Secondary | ICD-10-CM

## 2013-08-20 DIAGNOSIS — M47816 Spondylosis without myelopathy or radiculopathy, lumbar region: Secondary | ICD-10-CM | POA: Insufficient documentation

## 2013-08-20 NOTE — Progress Notes (Signed)
Subjective:    Patient ID: [name] [name], female    DOB: [birth year], 68 y.o.   MRN: [id number]  HPI  [name] [name] is back regarding her chronic pain. She is having her chronic diffuse pains. Her low back seems to be her biggest problem. Sitting for prolonged periods of time or standing. She feels better usually when she walks though.   I reviewed her MRI from 2012. It showed: L4-L5: Disc bulging now appears more symmetric without residual  focal disc protrusion. There is moderate facet and ligamentous  thickening without significant spinal stenosis or nerve root  encroachment. There is edema and small cysts projecting posteriorly  from both facet joints.  L5-S1: There is chronic disc degeneration with asymmetric  osteophytes on the left. Mild facet hypertrophy is present  bilaterally. There is stable mild left foraminal stenosis. No  right-sided nerve root encroachment is demonstrated. The spinal  canal and neural foramina are widely patent.  She remains somewhat active at home with chores, meals, etc. Usually when she finishes vacuuming her back is "done".   For exercise, she is not doing much. She feels more depressed.   Pain Inventory Average Pain 7 Pain Right Now 4 My pain is constant, dull and aching  In the last 24 hours, has pain interfered with the following? General activity 6 Relation with others 5 Enjoyment of life 3 What TIME of day is your pain at its worst? daytime Sleep (in general) Fair  Pain is worse with: sitting and standing Pain improves with: rest Relief from Meds: 6  Mobility walk without assistance walk with assistance use a cane ability to climb steps?  yes do you drive?  no transfers alone Do you have any goals in this area?  yes  Function disabled: date disabled 2004 I need assistance with the following:  meal prep, household duties and shopping  Neuro/Psych bladder control problems weakness numbness  Prior Studies Any changes since  last visit?  no  Physicians involved in your care Any changes since last visit?  no   Family History  Problem Relation Age of Onset  . Cancer      breast -- grandmother  . Cancer      lung -- uncles (3)  . Leukemia Sister   . Diabetes Father   . Diabetes Mother   . Coronary artery disease Father    History   Social History  . Marital Status: Married    Spouse Name: N/A    Number of Children: N/A  . Years of Education: N/A   Social History Main Topics  . Smoking status: Never Smoker   . Smokeless tobacco: Never Used  . Alcohol Use: No  . Drug Use: No  . Sexual Activity: None   Other Topics Concern  . None   Social History Narrative  . None   Past Surgical History  Procedure Laterality Date  . Cholecystectomy    . Abdominal hysterectomy  1984  . Breast surgery    . Cardiac catheterization    . Thyroidectomy    . Neuromas      removed from feet  . Wrist surgery      left  . Orif ankle fracture  01/10/2012    Procedure: OPEN REDUCTION INTERNAL FIXATION (ORIF) ANKLE FRACTURE;  Surgeon: Sanjuana Kava, MD;  Location: [facility];  Service: Orthopedics;  Laterality: Left;   Past Medical History  Diagnosis Date  . DM type 2 (diabetes mellitus, type 2)   . TIA (  transient ischemic attack) 2010  . Anxiety   . GERD (gastroesophageal reflux disease)   . HTN (hypertension)   . Hypothyroidism   . Depression   . Allergic rhinitis   . Fibromyalgia   . Hemorrhoids   . Migraine headache   . Chest pain     Catheterization 2004 normal coronary arteries  /  nuclear August 2 007, no ischemia  . Joint pain     Pains in multiple joints  . Shortness of breath     March, 2012  . Elevated alkaline phosphatase level   . Dysphagia   . Hiatal hernia   . Abdominal pain, RUQ (right upper quadrant)   . Hypercholesteremia   . Hematochezia   . UTI (lower urinary tract infection)   . Cervical radiculopathy   . Rotator cuff syndrome of right shoulder   . Chronic neck pain   .  Chronic back pain     R > L  . Chronic shoulder pain, right   . Lumbar radicular pain     R>L legs  . History of ankle fracture 01/08/2012    Status post open reduction internal fixation of a left ankle trimalleolar fracture by Dr. Iona Hansen on 01/10/2012.     BP 149/84  Pulse 88  Resp 14  Ht 5\' 6"  (1.676 m)  Wt 195 lb (88.451 kg)  BMI 31.49 kg/m2  SpO2 99%  Opioid Risk Score:   Fall Risk Score: Moderate Fall Risk (6-13 points) (pt educated on fall risk, brochure given to pt.)    Review of Systems  Genitourinary:       Bladder control problems  Musculoskeletal: Positive for back pain and myalgias.  Neurological: Positive for weakness, numbness and headaches.  All other systems reviewed and are negative.       Objective:   Physical Exam Constitutional: She is oriented to person, place, and time. She appears well-developed and well-nourished.  HENT:  Head: Normocephalic and atraumatic.  Eyes: Conjunctivae and EOM are normal. Pupils are equal, round, and reactive to light.  Neck: Normal range of motion.  Cardiovascular: Normal rate and regular rhythm.  Pulmonary/Chest: Effort normal.  Abdominal: Soft. Bowel sounds are normal.  Musculoskeletal:  Patient has mild right rotator cuff impingement signs which were minimal today.  Left neck has some tenderness with range of motion.  Left greater trochanter is tender to palpation and with abduction and crossed leg maneuver.  Lumbar paraspinals remain very taut on the right. She had  tenderness there with palpation. She was able to flex to about 90 degrees without discomfort. Bending actually felt better. Facet maneuvers were Positive to the right, extension and side bending caused more pain as well.   Neurological: She is alert and oriented to person, place, and time.  Patient has essentially loss over the C7 distribution. The tendon reflexes are diminished at trace to 1+ throughout both upper extremities however. I did not  appreciate any gross motor loss in the upper limbs however today. s. Cognitively she is intact and at baseline.  With gait she favors the left leg in weight bearing and is hesitant to shift weight fully to that side.  Assessment & Plan:   ASSESSMENT:  1. History of fibromyalgia.  2. Chronic low back pain. Myofascial pain.  3. Rotator cuff syndrome./right bicipital tendonitis  4. Anxiety and depression.  5. Cervical stenosis with left C7 radiculopathy  6. Hx of fall with left trimalleolar ankle fx.  7. Left greater troch  bursitis  8. Left wrist pain, extensor tendonitis   PLAN:  1. After informed consent and preparation of the skin with isopropyl alcohol, I injected the right lower lumbar paraspinals with 2cc of 1% lidocaine. The patient tolerated well, and no complications were experienced. Post-injection instructions were provided.    2.  Also will make a referral to outpt PT to address lumbar posture, ROM, facet mgt--HEP 3. Continue with neurontin for her general FMS pain. Continue klonopin for sleep/ hs anxiety  4. Continue with Zanaflex for spasms and back pain.--want her to take 2mg  TID for now  5. Consider MBB's for lumbar spine.  6. Continue effexor and wellbutrin. Consider neuropsych referral.   7. All questions were encouraged and answered. i will see her back in 3 months. I asked her to follow up with her PC regarding thyroid. 30 minutes were spent with the patient today.

## 2013-08-20 NOTE — Progress Notes (Signed)
Case discussed with Dr. Blaine Hamper at the time of the visit.  We reviewed the resident's history and exam and pertinent patient test results.  I agree with the assessment, diagnosis, and plan of care documented in the resident's note.

## 2013-08-20 NOTE — Patient Instructions (Addendum)
I WANT YOU TO WORK ON GETTING OUT OF THE HOUSE, WORKING ON YOUR EXERCISE, LEISURE ACTIVITIES, ETC---IT WILL HELP YOU EMOTIONALLY AND WITH YOUR PAIN AS WELL.    I WANT YOUR AEROBIC EXERCISE TO INCREASE!!! FOLLOW THROUGH WITH EXERCISE PROGRAM GIVEN TO YOU BY PT!!!!  TAKE YOUR TIZANIDINE THREE X DAILY !

## 2013-08-26 ENCOUNTER — Telehealth: Payer: Self-pay | Admitting: *Deleted

## 2013-08-26 MED ORDER — CETIRIZINE-PSEUDOEPHEDRINE ER 5-120 MG PO TB12: 1.0000 | ORAL_TABLET | Freq: Two times a day (BID) | ORAL | Status: DC

## 2013-08-26 NOTE — Telephone Encounter (Signed)
Pt called in with c/o cough. She is asking for a Rx to be called in for her.  I called pt back and she is c/o cold and cough for 5 days.  Cough is productive, no fever at this time. Also has congestion, hoarse since thurs, throat sore and dry , hurts to swallow. Ribs sore from coughing, and she feels weak. Increase SOB yesterday, better today.   She has tried Nyquil cold and cough, robitussin and increased fluids.  She denies fever and dizziness.     Pt states she is  allergic to codeine  Pt # [phone]

## 2013-08-26 NOTE — Telephone Encounter (Signed)
Pt informed and voices understanding 

## 2013-08-26 NOTE — Telephone Encounter (Signed)
If the patient has tried the mentioned treatments for <4-5 days, then I would recommend to continue them for another week.I have prescribed Zyrtec D for de-congestion and relief from throat pain - 20 tablets after reviewing her allergy list. It is also available OTC. I am not sure if she has taken this medication before, however, since she has multiple allergies, she should be alert and monitor for allergic symptoms when she tries a new medication.  If the symptoms do not resolve after continuation of treatment, she should seek appointment with clinic. However, if she feels worse, has shortness of breath, chest pain, fever, chills, or any other symptom that is unusual for her, she should contact emergency medical care.

## 2013-08-29 ENCOUNTER — Telehealth: Payer: Self-pay

## 2013-08-29 NOTE — Telephone Encounter (Signed)
Letter created. I will print out a copy at my home.

## 2013-08-29 NOTE — Telephone Encounter (Signed)
Patient needs a letter to get out of jury duty, because she cannot sit for long periods of time.

## 2013-08-30 NOTE — Telephone Encounter (Signed)
Left patient a voicemail to inform her that the letter she requested (to be excused from jury duty) was ready for pick up.

## 2013-10-12 ENCOUNTER — Other Ambulatory Visit: Payer: Self-pay | Admitting: Physical Medicine & Rehabilitation

## 2013-10-14 ENCOUNTER — Telehealth: Payer: Self-pay

## 2013-10-14 NOTE — Telephone Encounter (Signed)
Patient called requesting effexor refill be sent to [facility].  This was escribed.  Unable to contact patient to inform her.

## 2013-10-15 ENCOUNTER — Other Ambulatory Visit: Payer: Self-pay | Admitting: *Deleted

## 2013-10-15 ENCOUNTER — Encounter: Payer: Medicare HMO | Admitting: Physical Medicine & Rehabilitation

## 2013-10-15 MED ORDER — PRAVASTATIN SODIUM 20 MG PO TABS: 20.0000 mg | ORAL_TABLET | Freq: Every day | ORAL | Status: DC

## 2013-11-06 ENCOUNTER — Encounter: Payer: Self-pay | Admitting: Internal Medicine

## 2013-11-06 ENCOUNTER — Ambulatory Visit (INDEPENDENT_AMBULATORY_CARE_PROVIDER_SITE_OTHER): Payer: Medicare HMO | Admitting: Internal Medicine

## 2013-11-06 VITALS — BP 135/90 | HR 95 | Temp 97.7°F | Wt 197.5 lb

## 2013-11-06 DIAGNOSIS — E039 Hypothyroidism, unspecified: Secondary | ICD-10-CM

## 2013-11-06 DIAGNOSIS — E7800 Pure hypercholesterolemia, unspecified: Secondary | ICD-10-CM

## 2013-11-06 DIAGNOSIS — E119 Type 2 diabetes mellitus without complications: Secondary | ICD-10-CM

## 2013-11-06 DIAGNOSIS — Z1231 Encounter for screening mammogram for malignant neoplasm of breast: Secondary | ICD-10-CM

## 2013-11-06 DIAGNOSIS — R300 Dysuria: Secondary | ICD-10-CM

## 2013-11-06 DIAGNOSIS — I10 Essential (primary) hypertension: Secondary | ICD-10-CM

## 2013-11-06 LAB — POCT URINALYSIS DIPSTICK
Bilirubin, UA: NEGATIVE
Glucose, UA: NEGATIVE
KETONES UA: NEGATIVE
Nitrite, UA: NEGATIVE
Protein, UA: NEGATIVE
SPEC GRAV UA: 1.01
Urobilinogen, UA: 0.2
pH, UA: 5.5

## 2013-11-06 LAB — LIPID PANEL
CHOLESTEROL: 167 mg/dL (ref 0–200)
HDL: 67 mg/dL (ref >39)
LDL CALC: 63 mg/dL (ref 0–99)
TRIGLYCERIDES: 183 mg/dL — AB (ref <150)
Total CHOL/HDL Ratio: 2.5 Ratio
VLDL: 37 mg/dL (ref 0–40)

## 2013-11-06 LAB — GLUCOSE, CAPILLARY: Glucose-Capillary: 114 mg/dL — ABNORMAL HIGH (ref 70–99)

## 2013-11-06 LAB — TSH: TSH: 0.27 u[IU]/mL — ABNORMAL LOW (ref 0.350–4.500)

## 2013-11-06 LAB — POCT GLYCOSYLATED HEMOGLOBIN (HGB A1C): HEMOGLOBIN A1C: 5.6

## 2013-11-06 NOTE — Assessment & Plan Note (Signed)
Lipids:    Component Value Date/Time   CHOL 179 08/21/2012 1510   TRIG 126 08/21/2012 1510   HDL 71 08/21/2012 1510   LDLCALC 83 08/21/2012 1510   VLDL 25 08/21/2012 1510   CHOLHDL 2.5 08/21/2012 1510    Assessment: Patient is taking pravastatin 20 mg daily, with no apparent side effects  Plan: Check a lipid panel today; continue pravastatin 20 mg daily pending that result.

## 2013-11-06 NOTE — Assessment & Plan Note (Signed)
Telephone Contact Note  TSH  Date Value Range Status  07/17/2013 9.799* 0.350 - 4.500 uIU/mL Final   Assessment: TSH on 07/17/2013 was 9.799 on a levothyroxine dose of 112 mcg daily.  The dose was increased to 125 mcg daily at that time.    Plan: Check TSH today; continue current levothyroxine dose pending that result.

## 2013-11-06 NOTE — Progress Notes (Signed)
   Subjective:    Patient ID: [name] [name], female    DOB: 1945-11-08, 68 y.o.   MRN: [id number]  HPI Patient returns for management of her hypertension, diabetes mellitus, hyperlipidemia, hypothyroidism, and other chronic medical problems; she also has a complaint today of recent malodorous urine with dysuria and urinary frequency.  She reports that she has not seen her urologist Dr. Matilde Sprang in over a year; she does report that she is taking trimethoprim 100 mg daily prescribed by him.  She reports that her home blood sugars are generally in the 100-120 range.  I reviewed and updated the current medication list, allergies, past medical history, past surgical history, family history, and social history.  Review of Systems  Constitutional: Negative for fever and chills.  Respiratory: Negative for shortness of breath.   Cardiovascular: Positive for leg swelling (Occasional mild left ankle swelling, chronic). Negative for chest pain.  Gastrointestinal: Positive for abdominal pain (Mild lower abdominal discomfort with urination). Negative for vomiting.  Genitourinary: Positive for dysuria and frequency.       Objective:   Physical Exam  Constitutional: No distress.  Neck: Neck supple. No thyromegaly present.  Cardiovascular: Normal rate, regular rhythm and normal heart sounds.  Exam reveals no gallop and no friction rub.   No murmur heard. Pulmonary/Chest: Effort normal and breath sounds normal. No respiratory distress. She has no wheezes. She has no rales.  Abdominal: Soft. Bowel sounds are normal. There is tenderness (mild suprapubic tenderness). There is no rebound and no guarding.  Musculoskeletal: She exhibits no edema.  Lymphadenopathy:    She has no cervical adenopathy.       Assessment & Plan:

## 2013-11-06 NOTE — Assessment & Plan Note (Signed)
Assessment: Patient reports recent dysuria; she has history of urinary frequency and incontinence, and is followed by urologist Dr. Matilde Sprang.  His notes indicate that he feels she likely has chronic cystitis.  She has not followed up with Dr. Dr. Matilde Sprang in over a year by her report.  Plan: Check a urinalysis and urine culture today; I advised patient to followup with Dr. Dr. Matilde Sprang.

## 2013-11-06 NOTE — Assessment & Plan Note (Signed)
BP Readings from Last 3 Encounters:  11/06/13 135/90  08/20/13 149/84  08/19/13 139/84    Lab Results  Component Value Date   NA 139 07/17/2013   K 4.2 07/17/2013   CREATININE 0.69 07/17/2013    Assessment: Blood pressure control: controlled Progress toward BP goal:  at goal Comments: Blood pressure is at goal on losartan 50 mg daily.  Plan: Medications:  continue current medications Educational resources provided: brochure Self management tools provided: home blood pressure logbook

## 2013-11-06 NOTE — Assessment & Plan Note (Signed)
Lab Results  Component Value Date   HGBA1C 5.6 11/06/2013   HGBA1C 5.6 07/17/2013   HGBA1C 5.7 02/13/2013     Assessment: Diabetes control: good control (HgbA1C at goal) Progress toward A1C goal:  at goal Comments: Patient has good control of her diabetes on dietary management alone.  Plan: Medications:  Continue dietary management Home glucose monitoring: Frequency: once a day Timing: before breakfast Instruction/counseling given: reminded to bring blood glucose meter & log to each visit Educational resources provided: brochure Self management tools provided: home glucose logbook Other plans: Patient reports a recent eye exam by Dr. Bing Plume; the plan is to request a copy of the report.

## 2013-11-06 NOTE — Patient Instructions (Addendum)
General Instructions: 1.  Please schedule a followup appointment with your urologist. 2.  Please schedule a screening mammogram; this is overdue.    Progress Toward Treatment Goals:  Treatment Goal 11/06/2013  Hemoglobin A1C at goal  Blood pressure at goal  Prevent falls -    Self Care Goals & Plans:  Self Care Goal 11/06/2013  Manage my medications take my medicines as prescribed; bring my medications to every visit; refill my medications on time  Monitor my health check my feet daily  Eat healthy foods eat foods that are low in salt; eat baked foods instead of fried foods; drink diet soda or water instead of juice or soda  Be physically active find an activity I enjoy  Prevent falls -  Meeting treatment goals -    Home Blood Glucose Monitoring 11/06/2013  Check my blood sugar once a day  When to check my blood sugar before breakfast     Care Management & Community Referrals:  Referral 11/06/2013  Referrals made for care management support none needed  Referrals made to community resources none

## 2013-11-07 LAB — URINALYSIS, ROUTINE W REFLEX MICROSCOPIC
BILIRUBIN URINE: NEGATIVE
Glucose, UA: NEGATIVE mg/dL
HGB URINE DIPSTICK: NEGATIVE
KETONES UR: NEGATIVE mg/dL
NITRITE: NEGATIVE
PH: 6 (ref 5.0–8.0)
Protein, ur: NEGATIVE mg/dL
SPECIFIC GRAVITY, URINE: 1.01 (ref 1.005–1.030)
Urobilinogen, UA: 0.2 mg/dL (ref 0.0–1.0)

## 2013-11-07 LAB — URINALYSIS, MICROSCOPIC ONLY
Bacteria, UA: NONE SEEN
CASTS: NONE SEEN
Crystals: NONE SEEN
SQUAMOUS EPITHELIAL / LPF: NONE SEEN

## 2013-11-07 LAB — URINE CULTURE
COLONY COUNT: NO GROWTH
ORGANISM ID, BACTERIA: NO GROWTH

## 2013-11-11 ENCOUNTER — Encounter: Payer: Self-pay | Admitting: Registered Nurse

## 2013-11-11 ENCOUNTER — Encounter: Payer: Medicare HMO | Attending: Physical Medicine and Rehabilitation | Admitting: Registered Nurse

## 2013-11-11 VITALS — BP 169/83 | HR 108 | Resp 14 | Ht 66.0 in | Wt 198.0 lb

## 2013-11-11 DIAGNOSIS — M67919 Unspecified disorder of synovium and tendon, unspecified shoulder: Secondary | ICD-10-CM

## 2013-11-11 DIAGNOSIS — M47816 Spondylosis without myelopathy or radiculopathy, lumbar region: Secondary | ICD-10-CM

## 2013-11-11 DIAGNOSIS — M47817 Spondylosis without myelopathy or radiculopathy, lumbosacral region: Secondary | ICD-10-CM | POA: Insufficient documentation

## 2013-11-11 DIAGNOSIS — IMO0001 Reserved for inherently not codable concepts without codable children: Secondary | ICD-10-CM | POA: Insufficient documentation

## 2013-11-11 DIAGNOSIS — M19019 Primary osteoarthritis, unspecified shoulder: Secondary | ICD-10-CM

## 2013-11-11 DIAGNOSIS — M19011 Primary osteoarthritis, right shoulder: Secondary | ICD-10-CM

## 2013-11-11 DIAGNOSIS — M75100 Unspecified rotator cuff tear or rupture of unspecified shoulder, not specified as traumatic: Secondary | ICD-10-CM

## 2013-11-11 DIAGNOSIS — M19041 Primary osteoarthritis, right hand: Secondary | ICD-10-CM

## 2013-11-11 DIAGNOSIS — M19049 Primary osteoarthritis, unspecified hand: Secondary | ICD-10-CM

## 2013-11-11 DIAGNOSIS — Z79899 Other long term (current) drug therapy: Secondary | ICD-10-CM

## 2013-11-11 DIAGNOSIS — Z5181 Encounter for therapeutic drug level monitoring: Secondary | ICD-10-CM

## 2013-11-11 DIAGNOSIS — M719 Bursopathy, unspecified: Secondary | ICD-10-CM

## 2013-11-11 MED ORDER — CLONAZEPAM 1 MG PO TABS: ORAL_TABLET | ORAL | Status: DC

## 2013-11-11 MED ORDER — VENLAFAXINE HCL ER 150 MG PO CP24
ORAL_CAPSULE | ORAL | Status: DC
Start: 2013-11-11 — End: 2013-12-16

## 2013-11-11 MED ORDER — GABAPENTIN 300 MG PO CAPS
ORAL_CAPSULE | ORAL | Status: DC
Start: 2013-11-11 — End: 2014-03-24

## 2013-11-11 NOTE — Progress Notes (Signed)
Subjective:    Patient ID: [name] [name], female    DOB: Mar 08, 1946, 68 y.o.   MRN: [id number]  HPI: [name] [name] is a 68 year old female who returns for follow up for chronic pain and medication refill.She says her pain is located in her right shoulder and lower back right side. She rates her pain 7. Her current exercise regime is walking. [name] [name] is requesting increase in her Klonopin due to increase anxiety and financial hardship she is experiencing. Her daughter has moved in with her and she has two children. She's also trying to help her grandson financially. Emotional support given and all questions answered. Spoke with Dr. Naaman Plummer and he's in agreement to increase Klonopin. She had a lidocaine injection in January 2015 with good response, she is requesting another injection next month.  She will be scheduled with Dr. Naaman Plummer next visit for injection.  Pain Inventory Average Pain 6 Pain Right Now 7 My pain is constant, dull and aching  In the last 24 hours, has pain interfered with the following? General activity 3 Relation with others 6 Enjoyment of life 9 What TIME of day is your pain at its worst? morning and night Sleep (in general) Fair  Pain is worse with: sitting and some activites Pain improves with: rest, heat/ice and injections Relief from Meds: 8  Mobility use a cane how many minutes can you walk? 20 ability to climb steps?  yes do you drive?  yes Do you have any goals in this area?  yes  Function Do you have any goals in this area?  yes  Neuro/Psych bladder control problems weakness numbness depression anxiety  Prior Studies Any changes since last visit?  no  Physicians involved in your care Any changes since last visit?  no   Family History  Problem Relation Age of Onset  . Cancer      breast -- grandmother  . Cancer      lung -- uncles (3)  . Leukemia Sister   . Diabetes Father   . Diabetes Mother   . Coronary artery disease Father     History   Social History  . Marital Status: Married    Spouse Name: N/A    Number of Children: N/A  . Years of Education: N/A   Social History Main Topics  . Smoking status: Never Smoker   . Smokeless tobacco: Never Used  . Alcohol Use: No  . Drug Use: No  . Sexual Activity: None   Other Topics Concern  . None   Social History Narrative  . None   Past Surgical History  Procedure Laterality Date  . Cholecystectomy    . Abdominal hysterectomy  1984  . Breast surgery    . Cardiac catheterization    . Thyroidectomy    . Neuromas      removed from feet  . Wrist surgery      left  . Orif ankle fracture  01/10/2012    Procedure: OPEN REDUCTION INTERNAL FIXATION (ORIF) ANKLE FRACTURE;  Surgeon: Sanjuana Kava, MD;  Location: [facility];  Service: Orthopedics;  Laterality: Left;   Past Medical History  Diagnosis Date  . DM type 2 (diabetes mellitus, type 2)   . TIA (transient ischemic attack) 2010  . Anxiety   . GERD (gastroesophageal reflux disease)   . HTN (hypertension)   . Hypothyroidism   . Depression   . Allergic rhinitis   . Fibromyalgia   . Hemorrhoids   .  Migraine headache   . Chest pain     Catheterization 2004 normal coronary arteries  /  nuclear August 2 007, no ischemia  . Joint pain     Pains in multiple joints  . Shortness of breath     March, 2012  . Elevated alkaline phosphatase level   . Dysphagia   . Hiatal hernia   . Abdominal pain, RUQ (right upper quadrant)   . Hypercholesteremia   . Hematochezia   . UTI (lower urinary tract infection)   . Cervical radiculopathy   . Rotator cuff syndrome of right shoulder   . Chronic neck pain   . Chronic back pain     R > L  . Chronic shoulder pain, right   . Lumbar radicular pain     R>L legs  . History of ankle fracture 01/08/2012    Status post open reduction internal fixation of a left ankle trimalleolar fracture by Dr. Iona Hansen on 01/10/2012.     BP 169/83  Pulse 108  Resp 14  Ht 5\' 6"   (1.676 m)  Wt 198 lb (89.812 kg)  BMI 31.97 kg/m2  SpO2 98%  Opioid Risk Score:   Fall Risk Score: Moderate Fall Risk (6-13 points) (patient educated handout declined)   Review of Systems  Respiratory: Positive for shortness of breath.   Gastrointestinal: Positive for nausea, abdominal pain and constipation.  Genitourinary: Positive for difficulty urinating.  Neurological: Positive for weakness and numbness.  Psychiatric/Behavioral: Positive for dysphoric mood. The patient is nervous/anxious.   All other systems reviewed and are negative.      Objective:   Physical Exam  Nursing note and vitals reviewed. Constitutional: She is oriented to person, place, and time. She appears well-developed and well-nourished.  HENT:  Head: Normocephalic and atraumatic.  Neck: Normal range of motion. Neck supple.  Cardiovascular: Normal rate, regular rhythm and normal heart sounds.   Pulmonary/Chest: Effort normal and breath sounds normal.  Musculoskeletal:  Normal Muscle Bulk: Muscle Testing Reveals: Upper Extremities: Muscle Strength 5/5. Right Shoulder Tenderness at Trapezius Muscle and Spine of Scapula. Right Greater Trochanteric Tenderness Narrow Gait. Arises from chair with ease.  Neurological: She is alert and oriented to person, place, and time.  Skin: Skin is warm and dry.  Psychiatric: She has a normal mood and affect.          Assessment & Plan:  1.History of fibromyalgia. Continue Current Medication Regime and Exercise Regime.  2. Chronic low back pain. Myofascial pain. Continue  Voltaren gel, Neurontin and Zanaflex  3. Rotator cuff syndrome./right bicipital tendonitis : Continue Current Medication Regime and heat Therapy  4. Anxiety and depression. Continue Effexor and Klonopin, Dose Increased to RX: Klonopin 1 mg hs as needed #30.   20 minutes of face to face patient care time was spent during this visit. All questions were encouraged and answered.   F/U in 1  month with Dr. Naaman Plummer

## 2013-11-13 ENCOUNTER — Ambulatory Visit (HOSPITAL_COMMUNITY)
Admission: RE | Admit: 2013-11-13 | Discharge: 2013-11-13 | Disposition: A | Discharge status: Home / Self Care | Payer: Medicare HMO | Source: Ambulatory Visit | Attending: Internal Medicine | Admitting: Internal Medicine

## 2013-11-13 DIAGNOSIS — Z1231 Encounter for screening mammogram for malignant neoplasm of breast: Secondary | ICD-10-CM

## 2013-12-05 ENCOUNTER — Other Ambulatory Visit: Payer: Self-pay | Admitting: Internal Medicine

## 2013-12-05 DIAGNOSIS — E039 Hypothyroidism, unspecified: Secondary | ICD-10-CM

## 2013-12-16 ENCOUNTER — Other Ambulatory Visit: Payer: Self-pay | Admitting: Physical Medicine & Rehabilitation

## 2013-12-16 ENCOUNTER — Other Ambulatory Visit: Payer: Self-pay | Admitting: Registered Nurse

## 2013-12-25 ENCOUNTER — Encounter: Payer: Self-pay | Admitting: Physical Medicine & Rehabilitation

## 2013-12-25 ENCOUNTER — Encounter: Payer: Medicare HMO | Attending: Physical Medicine and Rehabilitation | Admitting: Physical Medicine & Rehabilitation

## 2013-12-25 VITALS — BP 148/79 | HR 101 | Resp 14 | Ht 66.0 in | Wt 198.0 lb

## 2013-12-25 DIAGNOSIS — M5412 Radiculopathy, cervical region: Secondary | ICD-10-CM | POA: Insufficient documentation

## 2013-12-25 DIAGNOSIS — M19019 Primary osteoarthritis, unspecified shoulder: Secondary | ICD-10-CM

## 2013-12-25 DIAGNOSIS — M47816 Spondylosis without myelopathy or radiculopathy, lumbar region: Secondary | ICD-10-CM

## 2013-12-25 DIAGNOSIS — F3289 Other specified depressive episodes: Secondary | ICD-10-CM

## 2013-12-25 DIAGNOSIS — IMO0001 Reserved for inherently not codable concepts without codable children: Secondary | ICD-10-CM

## 2013-12-25 DIAGNOSIS — M67919 Unspecified disorder of synovium and tendon, unspecified shoulder: Secondary | ICD-10-CM

## 2013-12-25 DIAGNOSIS — M719 Bursopathy, unspecified: Principal | ICD-10-CM | POA: Insufficient documentation

## 2013-12-25 DIAGNOSIS — M19011 Primary osteoarthritis, right shoulder: Secondary | ICD-10-CM

## 2013-12-25 DIAGNOSIS — M75100 Unspecified rotator cuff tear or rupture of unspecified shoulder, not specified as traumatic: Secondary | ICD-10-CM

## 2013-12-25 DIAGNOSIS — M19041 Primary osteoarthritis, right hand: Secondary | ICD-10-CM

## 2013-12-25 DIAGNOSIS — F329 Major depressive disorder, single episode, unspecified: Secondary | ICD-10-CM

## 2013-12-25 DIAGNOSIS — M47817 Spondylosis without myelopathy or radiculopathy, lumbosacral region: Secondary | ICD-10-CM | POA: Insufficient documentation

## 2013-12-25 DIAGNOSIS — M19049 Primary osteoarthritis, unspecified hand: Secondary | ICD-10-CM | POA: Insufficient documentation

## 2013-12-25 MED ORDER — TRAMADOL HCL 50 MG PO TABS: 50.0000 mg | ORAL_TABLET | Freq: Two times a day (BID) | ORAL | Status: DC | PRN

## 2013-12-25 MED ORDER — PREDNISONE 20 MG PO TABS: 20.0000 mg | ORAL_TABLET | ORAL | Status: DC

## 2013-12-25 NOTE — Patient Instructions (Signed)
CONTINUE TO WORK ON REGULAR STRETCHING AND POSTURE!!!!

## 2013-12-25 NOTE — Progress Notes (Signed)
Subjective:    Patient ID: [name] [name], female    DOB: 1946-04-03, 68 y.o.   MRN: [id number]  HPI  [name] [name] is back regarding her multiple pain issues. She states that her back is still bothering her. WE did lumbar paraspinal TPI's at last visit. She felt that about 2 days after she was seen here, she developed substantial back and body weakness which lasted for about 30 minutes.    She attended therapy for about 2 weeks. She couldn't afford it beyond that points, so she stopped. She is continuing with principles she learned at therapy while at home.   [name] [name] reports knots in her right trap. She also has pain/numbness in her right hand/wrist.   Pain Inventory Average Pain 6 Pain Right Now 7 My pain is dull and aching  In the last 24 hours, has pain interfered with the following? General activity 5 Relation with others 5 Enjoyment of life 3 What TIME of day is your pain at its worst? daytime Sleep (in general) Fair  Pain is worse with: bending, sitting, standing and some activites Pain improves with: rest, heat/ice, medication and injections Relief from Meds: 6  Mobility use a cane how many minutes can you walk? 20 ability to climb steps?  yes Do you have any goals in this area?  yes  Function Do you have any goals in this area?  yes  Neuro/Psych bladder control problems weakness numbness depression anxiety  Prior Studies Any changes since last visit?  no  Physicians involved in your care Any changes since last visit?  no   Family History  Problem Relation Age of Onset  . Cancer      breast -- grandmother  . Cancer      lung -- uncles (3)  . Leukemia Sister   . Diabetes Father   . Diabetes Mother   . Coronary artery disease Father    History   Social History  . Marital Status: Married    Spouse Name: N/A    Number of Children: N/A  . Years of Education: N/A   Social History Main Topics  . Smoking status: Never Smoker   . Smokeless tobacco:  Never Used  . Alcohol Use: No  . Drug Use: No  . Sexual Activity: None   Other Topics Concern  . None   Social History Narrative  . None   Past Surgical History  Procedure Laterality Date  . Cholecystectomy    . Abdominal hysterectomy  1984  . Breast surgery    . Cardiac catheterization    . Thyroidectomy    . Neuromas      removed from feet  . Wrist surgery      left  . Orif ankle fracture  01/10/2012    Procedure: OPEN REDUCTION INTERNAL FIXATION (ORIF) ANKLE FRACTURE;  Surgeon: Sanjuana Kava, MD;  Location: [facility];  Service: Orthopedics;  Laterality: Left;   Past Medical History  Diagnosis Date  . DM type 2 (diabetes mellitus, type 2)   . TIA (transient ischemic attack) 2010  . Anxiety   . GERD (gastroesophageal reflux disease)   . HTN (hypertension)   . Hypothyroidism   . Depression   . Allergic rhinitis   . Fibromyalgia   . Hemorrhoids   . Migraine headache   . Chest pain     Catheterization 2004 normal coronary arteries  /  nuclear August 2 007, no ischemia  . Joint pain     Pains in  multiple joints  . Shortness of breath     March, 2012  . Elevated alkaline phosphatase level   . Dysphagia   . Hiatal hernia   . Abdominal pain, RUQ (right upper quadrant)   . Hypercholesteremia   . Hematochezia   . UTI (lower urinary tract infection)   . Cervical radiculopathy   . Rotator cuff syndrome of right shoulder   . Chronic neck pain   . Chronic back pain     R > L  . Chronic shoulder pain, right   . Lumbar radicular pain     R>L legs  . History of ankle fracture 01/08/2012    Status post open reduction internal fixation of a left ankle trimalleolar fracture by Dr. Iona Hansen on 01/10/2012.     BP 148/79  Pulse 101  Resp 14  Ht 5\' 6"  (1.676 m)  Wt 198 lb (89.812 kg)  BMI 31.97 kg/m2  SpO2 93%  Opioid Risk Score:   Fall Risk Score: Moderate Fall Risk (6-13 points) (patient educated handout declined)   Review of Systems  Constitutional: Positive  for fever, chills, diaphoresis and appetite change.  Respiratory: Positive for cough.   Genitourinary: Positive for difficulty urinating.  Musculoskeletal: Positive for arthralgias, back pain, myalgias and neck pain.  Neurological: Positive for weakness and numbness.  Hematological: Bruises/bleeds easily.  Psychiatric/Behavioral: Positive for dysphoric mood. The patient is nervous/anxious.   All other systems reviewed and are negative.      Objective:   Physical Exam Constitutional: She is oriented to person, place, and time. She appears well-developed and well-nourished.  HENT:  Head: Normocephalic and atraumatic.  Eyes: Conjunctivae and EOM are normal. Pupils are equal, round, and reactive to light.  Neck: Normal range of motion.  Cardiovascular: Normal rate and regular rhythm.  Pulmonary/Chest: Effort normal.  Abdominal: Soft. Bowel sounds are normal.  Musculoskeletal:  Patient has mild right rotator cuff impingement signs which were minimal today.  Left neck has some tenderness with range of motion. Left greater trochanter is tender to palpation and with abduction and crossed leg maneuver.  Lumbar paraspinals again are very taut on the right. She had tenderness with palpation. She was able to flex to about 90 degrees without discomfort.  Had mild tenderness with EPB on right. Finkelstein negative. Neurological: She is alert and oriented to person, place, and time.  Patient has LT sensory loss over the C7 distribution. The tendon reflexes are diminished at trace to 1+ throughout both upper extremities however. I did not appreciate any gross motor loss in the upper limbs however today.  Cognitively she is intact and at baseline.  With gait she favors the left leg in weight bearing and is hesitant to shift weight fully to that side.   Assessment & Plan:   ASSESSMENT:  1. History of fibromyalgia.  2. Chronic low back pain with lumbar disc and facet disease. Myofascial pain.  3.  Rotator cuff syndrome./right bicipital tendonitis  4. Anxiety and depression.  5. Cervical stenosis with left C7 radiculopathy  6. Hx of fall with left trimalleolar ankle fx.  7. Left greater troch bursitis  8. Left and right wrist pain, extensor tendonitis    PLAN:  1. Will order xrays of lumbar spine to assess disc space and lumbar vertebrae 2. Needs to work on, stretching and  posture, posture, posture!!!! 3. Continue with neurontin for her general FMS pain. Continue klonopin for sleep/ hs anxiety  4. Continue with Zanaflex for spasms and  back pain.--again want her to take 2mg  TID  5. Consider MBB's for lumbar spine based upon xr findings  6. Continue effexor and wellbutrin.  7. I asked her to use her votaren gel on a SCHEDULED basis for her right wrist.  7. All questions were encouraged and answered. i will see her back in 3 months.  30 minutes were spent with the patient today.

## 2013-12-31 ENCOUNTER — Ambulatory Visit (HOSPITAL_COMMUNITY)
Admission: RE | Admit: 2013-12-31 | Discharge: 2013-12-31 | Disposition: A | Discharge status: Home / Self Care | Payer: Medicare HMO | Source: Ambulatory Visit | Attending: Physical Medicine & Rehabilitation | Admitting: Physical Medicine & Rehabilitation

## 2013-12-31 ENCOUNTER — Telehealth: Payer: Self-pay | Admitting: Physical Medicine & Rehabilitation

## 2013-12-31 DIAGNOSIS — M5137 Other intervertebral disc degeneration, lumbosacral region: Secondary | ICD-10-CM | POA: Insufficient documentation

## 2013-12-31 DIAGNOSIS — M51379 Other intervertebral disc degeneration, lumbosacral region without mention of lumbar back pain or lower extremity pain: Secondary | ICD-10-CM | POA: Insufficient documentation

## 2013-12-31 DIAGNOSIS — M47817 Spondylosis without myelopathy or radiculopathy, lumbosacral region: Secondary | ICD-10-CM

## 2013-12-31 DIAGNOSIS — M47816 Spondylosis without myelopathy or radiculopathy, lumbar region: Secondary | ICD-10-CM

## 2013-12-31 NOTE — Telephone Encounter (Signed)
Xray looks fairly consistent with MRI from 2012. There is A LOT of stool in the gut---she needs to do better with her bowel regimen.

## 2014-01-01 NOTE — Telephone Encounter (Signed)
Left message for patient to call office regarding her xray results.

## 2014-01-15 ENCOUNTER — Other Ambulatory Visit: Payer: Self-pay | Admitting: Internal Medicine

## 2014-01-27 ENCOUNTER — Encounter: Payer: Self-pay | Admitting: Internal Medicine

## 2014-01-27 ENCOUNTER — Ambulatory Visit (INDEPENDENT_AMBULATORY_CARE_PROVIDER_SITE_OTHER): Payer: Medicare HMO | Admitting: Internal Medicine

## 2014-01-27 VITALS — BP 125/82 | HR 93 | Temp 97.4°F | Ht 66.0 in | Wt 196.8 lb

## 2014-01-27 DIAGNOSIS — H8112 Benign paroxysmal vertigo, left ear: Secondary | ICD-10-CM

## 2014-01-27 DIAGNOSIS — L738 Other specified follicular disorders: Secondary | ICD-10-CM

## 2014-01-27 DIAGNOSIS — L02422 Furuncle of left axilla: Secondary | ICD-10-CM | POA: Insufficient documentation

## 2014-01-27 DIAGNOSIS — L739 Follicular disorder, unspecified: Secondary | ICD-10-CM

## 2014-01-27 DIAGNOSIS — K219 Gastro-esophageal reflux disease without esophagitis: Secondary | ICD-10-CM

## 2014-01-27 DIAGNOSIS — H8110 Benign paroxysmal vertigo, unspecified ear: Secondary | ICD-10-CM

## 2014-01-27 DIAGNOSIS — E119 Type 2 diabetes mellitus without complications: Secondary | ICD-10-CM

## 2014-01-27 LAB — POCT GLYCOSYLATED HEMOGLOBIN (HGB A1C): HEMOGLOBIN A1C: 5.6

## 2014-01-27 LAB — GLUCOSE, CAPILLARY: Glucose-Capillary: 97 mg/dL (ref 70–99)

## 2014-01-27 MED ORDER — OMEPRAZOLE 40 MG PO CPDR: 40.0000 mg | DELAYED_RELEASE_CAPSULE | Freq: Two times a day (BID) | ORAL | Status: DC

## 2014-01-27 NOTE — Assessment & Plan Note (Signed)
Lab Results  Component Value Date   HGBA1C 5.6 01/27/2014   HGBA1C 5.6 11/06/2013   HGBA1C 5.6 07/17/2013     Assessment: Diabetes control: good control (HgbA1C at goal) Progress toward A1C goal:  at goal Comments: none  Plan: Medications:  controlled w/o medications  Home glucose monitoring: Frequency: no home glucose monitoring Timing: N/A Instruction/counseling given: no instruction/counseling  Educational resources provided: brochure;handout Self management tools provided:no Other plans: f/u in 3 months with PCP

## 2014-01-27 NOTE — Progress Notes (Signed)
   Subjective:    Patient ID: [name] [name], female    DOB: 02/23/1946, 68 y.o.   MRN: [id number]  HPI Comments: 68 y.o PMH chronic pain, DM 2 controlled, depression, HTN, HLD  She presents for 1. Knot under left axillary x 2 days (noticed Saturday) that is sore to touch and has recently become red.  She denies fever and states she always has chills.  She reports her daughter has boils on her skin and she has been helping her with her boils.  She tried warm compresses with salt to the left underarm.   2. She needs medication refill of Prilosec bid instead of qd.  If she takes Prilosec bid it helps her her indigestion at night  3. She reports a popping sound in her left ear x 3 weeks and a "sissing" sound.  She feels like her balance is off intermittently x 3 weeks and denies falls.  She reports loss of hearing in left ear (family c/o her not being able to hear) and intermittent shooting pain in left ear x 3 weeks.  She has intermittent ringing in her ear and feels like the room is spinning.  Orthostatics checked and negative.    HM: mammogram neg 11/2013    ROS per above      Review of Systems     Objective:   Physical Exam  Nursing note and vitals reviewed. Constitutional: She is oriented to person, place, and time. Vital signs are normal. She appears well-developed and well-nourished. She is cooperative. No distress.  HENT:  Head: Normocephalic and atraumatic.  Left Ear: Hearing, tympanic membrane, external ear and ear canal normal.  Mouth/Throat: No oropharyngeal exudate.  No TMJ on exam Grossly hearing normal on exam  Eyes: Conjunctivae are normal. Pupils are equal, round, and reactive to light. Right eye exhibits no discharge. Left eye exhibits no discharge. No scleral icterus.  Cardiovascular: Normal rate, regular rhythm, S1 normal, S2 normal and normal heart sounds.   No murmur heard. No lower ext edema   Pulmonary/Chest: Effort normal and breath sounds normal. No respiratory  distress. She has no wheezes.  Abdominal: Soft. Bowel sounds are normal. There is no tenderness.  Neurological: She is alert and oriented to person, place, and time. She has normal strength. No sensory deficit. Gait normal.  Skin: Skin is warm and dry. No rash noted. She is not diaphoretic.     Psychiatric: She has a normal mood and affect. Her speech is normal and behavior is normal. Judgment and thought content normal. Cognition and memory are normal.          Assessment & Plan:  F/u in 3 months, sooner if needed

## 2014-01-27 NOTE — Assessment & Plan Note (Addendum)
Left ear symptoms concerning for BPPV (she has had previously) vs Eustachian tube dysfunction vs Menieres  Will refer back to ENT for further managment Disc'ed Vestibular rehab but pt prefers ENT referral which they may be able to do epley maneuver vs refer for Vestibular rehab Patient can try prn Meclizine if needed.

## 2014-01-27 NOTE — Assessment & Plan Note (Signed)
Left axilla. Not flutuant enough to I&D Advised continue warm compresses, try cetaphil antibacterial soap No need for antibiotics currently  Return to clinic if not resolving  Use hygiene at home

## 2014-01-27 NOTE — Assessment & Plan Note (Signed)
Rx refill of Prilosec 40 mg bid x 1 month for GERD which helps control and she has been on this dose in the past PCP to address further refills of bid dosing if indicated

## 2014-01-27 NOTE — Patient Instructions (Addendum)
General Instructions: I will refer you to Ear, Nose, Throat  Use warm compress and antibacterial soap (i.e Cetaphil soap bar) to left underarm return if worsening Follow up in 3 months, sooner if needed    Treatment Goals:  Goals (1 Years of Data) as of 01/27/14         As of Today 12/25/13 11/11/13 11/06/13 11/06/13     Blood Pressure    . Blood Pressure < 140/90  135/83 148/79 169/83 135/90 149/78     Lifestyle    . Prevent Falls           Result Component    . HEMOGLOBIN A1C < 7.0  5.6   5.6     . LDL CALC < 100     63       Progress Toward Treatment Goals:  Treatment Goal 01/27/2014  Hemoglobin A1C at goal  Blood pressure at goal  Prevent falls -    Self Care Goals & Plans:  Self Care Goal 01/27/2014  Manage my medications take my medicines as prescribed; bring my medications to every visit; refill my medications on time; follow the sick day instructions if I am sick  Monitor my health keep track of my blood glucose; check my feet daily  Eat healthy foods eat more vegetables; eat fruit for snacks and desserts; eat baked foods instead of fried foods; eat smaller portions; drink diet soda or water instead of juice or soda  Be physically active find an activity I enjoy  Prevent falls -  Meeting treatment goals maintain the current self-care plan    Home Blood Glucose Monitoring 01/27/2014  Check my blood sugar no home glucose monitoring  When to check my blood sugar N/A     Care Management & Community Referrals:  Referral 01/27/2014  Referrals made for care management support -  Referrals made to community resources none       Hearing Loss A hearing loss is sometimes called deafness. Hearing loss may be partial or total. CAUSES Hearing loss may be caused by:  Wax in the ear canal.  Infection of the ear canal.  Infection of the middle ear.  Trauma to the ear or surrounding area.  Fluid in the middle ear.  A hole in the eardrum (perforated  eardrum).  Exposure to loud sounds or music.  Problems with the hearing nerve.  Certain medications. Hearing loss without wax, infection, or a history of injury may mean that the nerve is involved. Hearing loss with severe dizziness, nausea and vomiting or ringing in the ear may suggest a hearing nerve irritation or problems in the middle or inner ear. If hearing loss is untreated, there is a greater likelihood for residual or permanent hearing loss. DIAGNOSIS A hearing test (audiometry) assesses hearing loss. The audiometry test needs to be performed by a hearing specialist (audiologist). TREATMENT Treatment for recent onset of hearing loss may include:  Ear wax removal.  Medications that kill germs (antibiotics).  Cortisone medications.  Prompt follow up with the appropriate specialist. Return of hearing depends on the cause of your hearing loss, so proper medical follow-up is important. Some hearing loss may not be reversible, and a caregiver should discuss care and treatment options with you. SEEK MEDICAL CARE IF:   You have a severe headache, dizziness, or changes in vision.  You have new or increased weakness.  You develop repeated vomiting or other serious medical problems.  You have a fever. Document Released: 06/27/2005 Document Revised:  09/19/2011 Document Reviewed: 10/22/2009 [employer] Patient Information 2015 [place], [place]. This information is not intended to replace advice given to you by your health care provider. Make sure you discuss any questions you have with your health care provider.  Folliculitis  Folliculitis is redness, soreness, and swelling (inflammation) of the hair follicles. This condition can occur anywhere on the body. People with weakened immune systems, diabetes, or obesity have a greater risk of getting folliculitis. CAUSES  Bacterial infection. This is the most common cause.  Fungal infection.  Viral infection.  Contact with certain  chemicals, especially oils and tars. Long-term folliculitis can result from bacteria that live in the nostrils. The bacteria may trigger multiple outbreaks of folliculitis over time. SYMPTOMS Folliculitis most commonly occurs on the scalp, thighs, legs, back, buttocks, and areas where hair is shaved frequently. An early sign of folliculitis is a small, white or yellow, pus-filled, itchy lesion (pustule). These lesions appear on a red, inflamed follicle. They are usually less than 0.2 inches (5 mm) wide. When there is an infection of the follicle that goes deeper, it becomes a boil or furuncle. A group of closely packed boils creates a larger lesion (carbuncle). Carbuncles tend to occur in hairy, sweaty areas of the body. DIAGNOSIS  Your caregiver can usually tell what is wrong by doing a physical exam. A sample may be taken from one of the lesions and tested in a lab. This can help determine what is causing your folliculitis. TREATMENT  Treatment may include:  Applying warm compresses to the affected areas.  Taking antibiotic medicines orally or applying them to the skin.  Draining the lesions if they contain a large amount of pus or fluid.  Laser hair removal for cases of long-lasting folliculitis. This helps to prevent regrowth of the hair. HOME CARE INSTRUCTIONS  Apply warm compresses to the affected areas as directed by your caregiver.  If antibiotics are prescribed, take them as directed. Finish them even if you start to feel better.  You may take over-the-counter medicines to relieve itching.  Do not shave irritated skin.  Follow up with your caregiver as directed. SEEK IMMEDIATE MEDICAL CARE IF:   You have increasing redness, swelling, or pain in the affected area.  You have a fever. MAKE SURE YOU:  Understand these instructions.  Will watch your condition.  Will get help right away if you are not doing well or get worse. Document Released: 09/05/2001 Document Revised:  12/27/2011 Document Reviewed: 09/27/2011 [facility] Patient Information 2015 [place], [place]. This information is not intended to replace advice given to you by your health care provider. Make sure you discuss any questions you have with your health care provider.

## 2014-01-28 NOTE — Telephone Encounter (Signed)
A user error has taken place: encounter opened in error, closed for administrative reasons.

## 2014-01-28 NOTE — Progress Notes (Signed)
Case discussed with Dr. McLean soon after the resident saw the patient.  We reviewed the resident's history and exam and pertinent patient test results.  I agree with the assessment, diagnosis, and plan of care documented in the resident's note. 

## 2014-02-20 ENCOUNTER — Other Ambulatory Visit: Payer: Self-pay | Admitting: *Deleted

## 2014-02-20 ENCOUNTER — Ambulatory Visit: Payer: Medicare HMO | Admitting: Internal Medicine

## 2014-02-20 ENCOUNTER — Encounter: Payer: Self-pay | Admitting: Internal Medicine

## 2014-02-20 NOTE — Telephone Encounter (Signed)
Patient's diabetes is well controlled without medications; her hemoglobin A1c is consistently good.  I would advise that she stop home monitoring for now; we can recheck her hemoglobin A1c at next visit.

## 2014-02-20 NOTE — Telephone Encounter (Signed)
Cool! She will be here for an appt tomorrow and can be told

## 2014-02-21 ENCOUNTER — Ambulatory Visit (INDEPENDENT_AMBULATORY_CARE_PROVIDER_SITE_OTHER): Payer: Medicare HMO | Admitting: Internal Medicine

## 2014-02-21 VITALS — BP 151/89 | HR 92 | Temp 98.5°F | Ht 66.0 in | Wt 197.5 lb

## 2014-02-21 DIAGNOSIS — K219 Gastro-esophageal reflux disease without esophagitis: Secondary | ICD-10-CM

## 2014-02-21 DIAGNOSIS — E039 Hypothyroidism, unspecified: Secondary | ICD-10-CM

## 2014-02-21 DIAGNOSIS — L678 Other hair color and hair shaft abnormalities: Secondary | ICD-10-CM

## 2014-02-21 DIAGNOSIS — L739 Follicular disorder, unspecified: Secondary | ICD-10-CM

## 2014-02-21 DIAGNOSIS — L738 Other specified follicular disorders: Secondary | ICD-10-CM

## 2014-02-21 MED ORDER — OMEPRAZOLE 40 MG PO CPDR: 40.0000 mg | DELAYED_RELEASE_CAPSULE | Freq: Two times a day (BID) | ORAL | Status: DC

## 2014-02-21 NOTE — Patient Instructions (Addendum)
Thank you for your visit today.   Please return to the internal medicine clinic to see Dr. Marinda Elk in September.  Please continue warm compresses on your boils.  You may try PRID ointment on your boils to see if this helps.  We are going to check your thyroid today.     Your current medical regimen is effective;  continue present plan and take all medications as prescribed.     Please be sure to bring all of your medications with you to every visit; this includes herbal supplements, vitamins, eye drops, and any over-the-counter medications.   Should you have any questions regarding your medications and/or any new or worsening symptoms, please be sure to call the clinic at [phone].   If you believe that you are suffering from a life threatening condition or one that may result in the loss of limb or function, then you should call 911 or proceed to the nearest Emergency Department.     A healthy lifestyle and preventative care can promote health and wellness.   Maintain regular health, dental, and eye exams.  Eat a healthy diet. Foods like vegetables, fruits, whole grains, low-fat dairy products, and lean protein foods contain the nutrients you need without too many calories. Decrease your intake of foods high in solid fats, added sugars, and salt. Get information about a proper diet from your caregiver, if necessary.  Regular physical exercise is one of the most important things you can do for your health. Most adults should get at least 150 minutes of moderate-intensity exercise (any activity that increases your heart rate and causes you to sweat) each week. In addition, most adults need muscle-strengthening exercises on 2 or more days a week.   Maintain a healthy weight. The body mass index (BMI) is a screening tool to identify possible weight problems. It provides an estimate of body fat based on height and weight. Your caregiver can help determine your BMI, and can help you  achieve or maintain a healthy weight. For adults 20 years and older:  A BMI below 18.5 is considered underweight.  A BMI of 18.5 to 24.9 is normal.  A BMI of 25 to 29.9 is considered overweight.  A BMI of 30 and above is considered obese.  Boils An abscess is also known as a furuncle or boil. CAUSES  An abscess occurs when tissue gets infected. This can occur from blockage of oil or sweat glands, infection of hair follicles, or a minor injury to the skin. As the body tries to fight the infection, pus collects in the area and creates pressure under the skin. This pressure causes pain. People with weakened immune systems have difficulty fighting infections and get certain abscesses more often.  SYMPTOMS Usually an abscess develops on the skin and becomes a painful mass that is red, warm, and tender. If the abscess forms under the skin, you may feel a moveable soft area under the skin. Some abscesses break open (rupture) on their own, but most will continue to get worse without care. The infection can spread deeper into the body and eventually into the bloodstream, causing you to feel ill.  DIAGNOSIS  Your caregiver will take your medical history and perform a physical exam. A sample of fluid may also be taken from the abscess to determine what is causing your infection. TREATMENT  Your caregiver may prescribe antibiotic medicines to fight the infection. However, taking antibiotics alone usually does not cure an abscess. Your caregiver may need to  make a small cut (incision) in the abscess to drain the pus. In some cases, gauze is packed into the abscess to reduce pain and to continue draining the area. HOME CARE INSTRUCTIONS   Only take over-the-counter or prescription medicines for pain, discomfort, or fever as directed by your caregiver.  If you were prescribed antibiotics, take them as directed. Finish them even if you start to feel better.  If gauze is used, follow your caregiver's  directions for changing the gauze.  To avoid spreading the infection:  Keep your draining abscess covered with a bandage.  Wash your hands well.  Do not share personal care items, towels, or whirlpools with others.  Avoid skin contact with others.  Keep your skin and clothes clean around the abscess.  Keep all follow-up appointments as directed by your caregiver. SEEK MEDICAL CARE IF:   You have increased pain, swelling, redness, fluid drainage, or bleeding.  You have muscle aches, chills, or a general ill feeling.  You have a fever. MAKE SURE YOU:   Understand these instructions.  Will watch your condition.  Will get help right away if you are not doing well or get worse. Document Released: 04/06/2005 Document Revised: 12/27/2011 Document Reviewed: 09/09/2011 [facility] Patient Information 2015 [place], [place]. This information is not intended to replace advice given to you by your health care provider. Make sure you discuss any questions you have with your health care provider.

## 2014-02-21 NOTE — Progress Notes (Signed)
Patient ID: [name] [name], female   DOB: 07/31/1945, 68 y.o.   MRN: [id number]    Subjective:   Patient ID: [name] [name] female    DOB: 06-26-1946 68 y.o.    MRN: [id number] Health Maintenance Due: Health Maintenance Due  Topic Date Due  . Zostavax  02/15/2008  . Influenza Vaccine  02/08/2014    _________________________________________________  HPI: [name] [name] is a 68 y.o. female here for a acute visit for "boils".  Pt has a PMH outlined below.  Please see problem-based charting assessment and plan note for further details of medical issues addressed at today's visit.  PMH: Past Medical History  Diagnosis Date  . DM type 2 (diabetes mellitus, type 2)   . TIA (transient ischemic attack) 2010  . Anxiety   . GERD (gastroesophageal reflux disease)   . HTN (hypertension)   . Hypothyroidism   . Depression   . Allergic rhinitis   . Fibromyalgia   . Hemorrhoids   . Migraine headache   . Chest pain     Catheterization 2004 normal coronary arteries  /  nuclear August 2 007, no ischemia  . Joint pain     Pains in multiple joints  . Shortness of breath     March, 2012  . Elevated alkaline phosphatase level   . Dysphagia   . Hiatal hernia   . Abdominal pain, RUQ (right upper quadrant)   . Hypercholesteremia   . Hematochezia   . UTI (lower urinary tract infection)   . Cervical radiculopathy   . Rotator cuff syndrome of right shoulder   . Chronic neck pain   . Chronic back pain     R > L  . Chronic shoulder pain, right   . Lumbar radicular pain     R>L legs  . History of ankle fracture 01/08/2012    Status post open reduction internal fixation of a left ankle trimalleolar fracture by Dr. Iona Hansen on 01/10/2012.      Medications: Current Outpatient Prescriptions on File Prior to Visit  Medication Sig Dispense Refill  . albuterol (PROVENTIL HFA;VENTOLIN HFA) 108 (90 BASE) MCG/ACT inhaler Inhale 2 puffs into the lungs every 6 (six) hours as needed for wheezing  or shortness of breath.  1 Inhaler  6  . aspirin EC 81 MG tablet Take 81 mg by mouth daily.      Marland Kitchen buPROPion (WELLBUTRIN XL) 150 MG 24 hr tablet TAKE ONE TABLET BY MOUTH ONCE DAILY  30 tablet  5  . Cholecalciferol (VITAMIN D3) 2000 UNITS TABS Take 1 tablet by mouth daily.      . diclofenac sodium (VOLTAREN) 1 % GEL Apply 4 g topically daily as needed. As needed for pain.  5 Tube  5  . fluticasone (FLONASE) 50 MCG/ACT nasal spray Place 2 sprays into the nose daily.  16 g  11  . gabapentin (NEURONTIN) 300 MG capsule TAKE 1 CAPSULE BY MOUTH THREE TIMES DAILY  90 capsule  3  . hyoscyamine (HYOMAX-SL) 0.125 MG SL tablet Dissolve 1 to 2 tablets under the tongue every 4 (four) hours as needed for chest pain.  30 tablet  3  . levothyroxine (SYNTHROID, LEVOTHROID) 125 MCG tablet Take 1 tablet (125 mcg total) by mouth daily.  31 tablet  6  . losartan (COZAAR) 25 MG tablet TAKE 2 TABLETS BY MOUTH EVERY DAY  180 tablet  3  . MECLIZINE HCL PO Take by mouth.      Marland Kitchen  pravastatin (PRAVACHOL) 20 MG tablet Take 1 tablet (20 mg total) by mouth daily.  90 tablet  2  . trimethoprim (TRIMPEX) 100 MG tablet Take 100 mg by mouth daily.      Marland Kitchen venlafaxine XR (EFFEXOR-XR) 150 MG 24 hr capsule TAKE ONE CAPSULE BY MOUTH TWICE DAILY  60 capsule  5  . Blood Glucose Monitoring Suppl (ONE TOUCH ULTRA SYSTEM KIT) W/DEVICE KIT 1 kit by Does not apply route once. Use to test blood glucose one time daily. Dx:250.00      . glucose blood (ONE TOUCH TEST STRIPS) test strip 1 each by Other route daily. Use to test blood glucose one time daily. Dx:250.00      . Lancet Devices (ACCU-CHEK SOFTCLIX) lancets Use to check blood sugars once daily. Dx code: 250.00.  100 each  3  . metroNIDAZOLE (FLAGYL) 500 MG tablet Take 500 mg by mouth 3 (three) times daily.      Marland Kitchen zoster vaccine live, PF, (ZOSTAVAX) 44010 UNT/0.65ML injection Inject 19,400 Units into the skin once.  1 each  0   No current facility-administered medications on file prior to  visit.    Allergies: Allergies  Allergen Reactions  . Nsaids Anaphylaxis  . Relafen [Nabumetone] Anaphylaxis and Swelling  . Diclofenac Sodium Swelling    "almost died" caused neck to swell.   . Other     Honey causes throat to swell. Strawberries cause a rash.   . Trazodone And Nefazodone     Possible akathisia  . Penicillins Swelling and Rash    Tongue swelled     FH: Family History  Problem Relation Age of Onset  . Cancer      breast -- grandmother  . Cancer      lung -- uncles (3)  . Leukemia Sister   . Diabetes Father   . Diabetes Mother   . Coronary artery disease Father     SH: History   Social History  . Marital Status: Married    Spouse Name: N/A    Number of Children: N/A  . Years of Education: N/A   Social History Main Topics  . Smoking status: Never Smoker   . Smokeless tobacco: Never Used  . Alcohol Use: No  . Drug Use: No  . Sexual Activity: Not on file   Other Topics Concern  . Not on file   Social History Narrative  . No narrative on file    Review of Systems: Constitutional: Negative for fever, chills and weight loss.  Eyes: Negative for blurred vision.  Respiratory: Negative for cough and shortness of breath.  Cardiovascular: Negative for chest pain, palpitations and leg swelling.  Gastrointestinal: Negative for nausea, vomiting, abdominal pain, diarrhea, constipation and blood in stool.  Genitourinary: Negative for dysuria, urgency and frequency.  Musculoskeletal: Negative for myalgias and back pain.  Neurological: Negative for dizziness, weakness and headaches.     Objective:   Vital Signs: Filed Vitals:   02/21/14 1519  BP: 151/89  Pulse: 92  Temp: 98.5 F (36.9 C)  TempSrc: Oral  Height: _0  (1.676 m)  Weight: 197 lb 8 oz (89.585 kg)  SpO2: 98%      BP Readings from Last 3 Encounters:  02/24/14 156/84  02/21/14 151/89  01/27/14 125/82    Physical Exam: Constitutional: Vital signs reviewed.  Patient is  well-developed and well-nourished in NAD and cooperative with exam.  Head: Normocephalic and atraumatic. Eyes: PERRL, EOMI, conjunctivae nl, no scleral icterus.  Neck: Supple.  Cardiovascular: RRR, no MRG. Pulmonary/Chest: normal effort, non-tender to palpation, CTAB, no wheezes, rales, or rhonchi. Abdominal: Soft. NT/ND +BS. Musculoskeletal: Full range ofmotion. no pain,edema,or deformity.  Nocyanosis,clubbing,oredema. Neurological: A&O x3, cranial nerves II-XII are grossly intact, moving all extremities. Extremities: 2+DP b/l; no pitting edema. Skin: Warm, dry and intact. Left axilla: 0.5cm furuncles x 3; one that is tender to palpation without surrounding erythema or warmth.     Assessment & Plan:   Assessment and plan was discussed and formulated with my attending.

## 2014-02-21 NOTE — Assessment & Plan Note (Addendum)
Pt on 178mcg synthroid.  Last TSH:  Lab Results  Component Value Date   TSH 0.270* 11/06/2013  -check TSH   ADDENDUM: Lab Results  Component Value Date   TSH 0.091* 02/21/2014  -decrease synthroid dose to 124mcg daily  -recheck TSH in ~6 weeks

## 2014-02-22 LAB — TSH: TSH: 0.091 u[IU]/mL — ABNORMAL LOW (ref 0.350–4.500)

## 2014-02-24 ENCOUNTER — Encounter: Payer: Medicare HMO | Attending: Physical Medicine and Rehabilitation | Admitting: Registered Nurse

## 2014-02-24 ENCOUNTER — Encounter: Payer: Self-pay | Admitting: Internal Medicine

## 2014-02-24 ENCOUNTER — Encounter: Payer: Self-pay | Admitting: Registered Nurse

## 2014-02-24 VITALS — BP 156/84 | HR 96 | Resp 14 | Wt 196.4 lb

## 2014-02-24 DIAGNOSIS — M19041 Primary osteoarthritis, right hand: Secondary | ICD-10-CM

## 2014-02-24 DIAGNOSIS — M5412 Radiculopathy, cervical region: Secondary | ICD-10-CM | POA: Diagnosis present

## 2014-02-24 DIAGNOSIS — M75101 Unspecified rotator cuff tear or rupture of right shoulder, not specified as traumatic: Secondary | ICD-10-CM

## 2014-02-24 DIAGNOSIS — IMO0001 Reserved for inherently not codable concepts without codable children: Secondary | ICD-10-CM

## 2014-02-24 DIAGNOSIS — M549 Dorsalgia, unspecified: Secondary | ICD-10-CM | POA: Diagnosis present

## 2014-02-24 DIAGNOSIS — M47817 Spondylosis without myelopathy or radiculopathy, lumbosacral region: Secondary | ICD-10-CM | POA: Diagnosis not present

## 2014-02-24 DIAGNOSIS — M47816 Spondylosis without myelopathy or radiculopathy, lumbar region: Secondary | ICD-10-CM

## 2014-02-24 DIAGNOSIS — F3289 Other specified depressive episodes: Secondary | ICD-10-CM

## 2014-02-24 DIAGNOSIS — Z5181 Encounter for therapeutic drug level monitoring: Secondary | ICD-10-CM

## 2014-02-24 DIAGNOSIS — M19049 Primary osteoarthritis, unspecified hand: Secondary | ICD-10-CM

## 2014-02-24 DIAGNOSIS — M19019 Primary osteoarthritis, unspecified shoulder: Secondary | ICD-10-CM

## 2014-02-24 DIAGNOSIS — M19011 Primary osteoarthritis, right shoulder: Secondary | ICD-10-CM

## 2014-02-24 DIAGNOSIS — M67919 Unspecified disorder of synovium and tendon, unspecified shoulder: Secondary | ICD-10-CM

## 2014-02-24 DIAGNOSIS — F329 Major depressive disorder, single episode, unspecified: Secondary | ICD-10-CM

## 2014-02-24 DIAGNOSIS — M719 Bursopathy, unspecified: Secondary | ICD-10-CM

## 2014-02-24 DIAGNOSIS — Z79899 Other long term (current) drug therapy: Secondary | ICD-10-CM

## 2014-02-24 MED ORDER — CLONAZEPAM 1 MG PO TABS: ORAL_TABLET | ORAL | Status: DC

## 2014-02-24 MED ORDER — LEVOTHYROXINE SODIUM 112 MCG PO TABS: 112.0000 ug | ORAL_TABLET | Freq: Every day | ORAL | Status: DC

## 2014-02-24 MED ORDER — TRAMADOL HCL 50 MG PO TABS: 50.0000 mg | ORAL_TABLET | Freq: Two times a day (BID) | ORAL | Status: DC | PRN

## 2014-02-24 MED ORDER — TIZANIDINE HCL 2 MG PO TABS: 2.0000 mg | ORAL_TABLET | Freq: Every day | ORAL | Status: DC | PRN

## 2014-02-24 NOTE — Assessment & Plan Note (Signed)
-  refilled omeprazole 40mg  twice daily

## 2014-02-24 NOTE — Assessment & Plan Note (Signed)
Pt seen on 7/20 for furuncle of the axilla and told to return if not resolving.  She has been using warm compresses which appear to have been helping.  She had two ~0.5cm furuncles in her left axilla but a new one has come up.  Pt has a h/o DMII but is well controlled and not on any meds--last HA1c 5.6 on 01/27/14.  No personal or family history of hidradenitis.   No other family members have similar s/s.  No lymphadenopathy.   -advised pt to try PRID otc drawing salve as this may help -continue warm compresses  -no need for I&D at this point -return to clinic to see Dr. Marinda Elk in September

## 2014-02-24 NOTE — Progress Notes (Signed)
INTERNAL MEDICINE TEACHING ATTENDING ADDENDUM - Aldine Contes, MD: I personally saw and evaluated Ms. Lasure in this clinic visit in conjunction with the resident, Dr. Gordy Levan. I have discussed patient's plan of care with medical resident during this visit. I have confirmed the physical exam findings and have read and agree with the clinic note including the plan with the following addition: - Patient with "boils" L axillae - On exam- 3 small abscesses approx 0.5 cms noted - Patinet instructed to continue warm compresses - Of note she has put on bactrim by urology. Will cover for possible MRSA - No need for I and D at this time

## 2014-02-24 NOTE — Progress Notes (Signed)
Subjective:    Patient ID: [name] [name], female    DOB: 1945/10/22, 68 y.o.   MRN: [id number]  HPI: [name] [name] is a 68 year old female who returns for follow up for chronic pain and medication refill.She says her pain is located in her mid to lower back and bilateral knees.. She rates her pain 7. Her current exercise regime is walking and performing stretching exercises.  She admits she is under stress with her daughter living with them. The [place] was given, she says she is going to seek counseling. Emotional support given.   Pain Inventory Average Pain 7 Pain Right Now 7 My pain is constant and dull  In the last 24 hours, has pain interfered with the following? General activity 4 Relation with others 6 Enjoyment of life 8 What TIME of day is your pain at its worst? daytime and evening Sleep (in general) Fair  Pain is worse with: bending and some activites Pain improves with: rest, medication and injections Relief from Meds: 7  Mobility walk without assistance walk with assistance use a cane how many minutes can you walk? 20 ability to climb steps?  yes do you drive?  yes  Function I need assistance with the following:  meal prep, household duties and shopping  Neuro/Psych bladder control problems bowel control problems weakness numbness tremor trouble walking dizziness depression anxiety  Prior Studies Any changes since last visit?  no  Physicians involved in your care Any changes since last visit?  no   Family History  Problem Relation Age of Onset  . Cancer      breast -- grandmother  . Cancer      lung -- uncles (3)  . Leukemia Sister   . Diabetes Father   . Diabetes Mother   . Coronary artery disease Father    History   Social History  . Marital Status: Married    Spouse Name: N/A    Number of Children: N/A  . Years of Education: N/A   Social History Main Topics  . Smoking status: Never Smoker   . Smokeless  tobacco: Never Used  . Alcohol Use: No  . Drug Use: No  . Sexual Activity: None   Other Topics Concern  . None   Social History Narrative  . None   Past Surgical History  Procedure Laterality Date  . Cholecystectomy    . Abdominal hysterectomy  1984  . Breast surgery    . Cardiac catheterization    . Thyroidectomy    . Neuromas      removed from feet  . Wrist surgery      left  . Orif ankle fracture  01/10/2012    Procedure: OPEN REDUCTION INTERNAL FIXATION (ORIF) ANKLE FRACTURE;  Surgeon: Sanjuana Kava, MD;  Location: [facility];  Service: Orthopedics;  Laterality: Left;   Past Medical History  Diagnosis Date  . DM type 2 (diabetes mellitus, type 2)   . TIA (transient ischemic attack) 2010  . Anxiety   . GERD (gastroesophageal reflux disease)   . HTN (hypertension)   . Hypothyroidism   . Depression   . Allergic rhinitis   . Fibromyalgia   . Hemorrhoids   . Migraine headache   . Chest pain     Catheterization 2004 normal coronary arteries  /  nuclear August 2 007, no ischemia  . Joint pain     Pains in multiple joints  . Shortness of breath  March, 2012  . Elevated alkaline phosphatase level   . Dysphagia   . Hiatal hernia   . Abdominal pain, RUQ (right upper quadrant)   . Hypercholesteremia   . Hematochezia   . UTI (lower urinary tract infection)   . Cervical radiculopathy   . Rotator cuff syndrome of right shoulder   . Chronic neck pain   . Chronic back pain     R > L  . Chronic shoulder pain, right   . Lumbar radicular pain     R>L legs  . History of ankle fracture 01/08/2012    Status post open reduction internal fixation of a left ankle trimalleolar fracture by Dr. Iona Hansen on 01/10/2012.     BP 156/84  Pulse 96  Resp 14  Wt 196 lb 6.4 oz (89.086 kg)  SpO2 97%  Opioid Risk Score:   Fall Risk Score: Moderate Fall Risk (6-13 points) (previoulsy educated and given handout)  Review of Systems  Constitutional: Positive for chills,  diaphoresis, appetite change and unexpected weight change.  Respiratory: Positive for cough.   Cardiovascular: Positive for leg swelling.  Gastrointestinal: Positive for nausea, abdominal pain and constipation.  Genitourinary: Positive for difficulty urinating.  Musculoskeletal: Positive for gait problem.  Skin: Positive for rash.  Neurological: Positive for dizziness, tremors, weakness and numbness.  Hematological: Bruises/bleeds easily.  Psychiatric/Behavioral: Positive for dysphoric mood. The patient is nervous/anxious.   All other systems reviewed and are negative.      Objective:   Physical Exam  Nursing note and vitals reviewed. Constitutional: She is oriented to person, place, and time. She appears well-developed and well-nourished.  HENT:  Head: Normocephalic and atraumatic.  Neck: Normal range of motion. Neck supple.  Cardiovascular: Normal rate and regular rhythm.   Pulmonary/Chest: Effort normal and breath sounds normal.  Musculoskeletal:  Normal Muscle Bulk and Muscle testing Reveals: Upper Extremities: Full ROM and Muscle strength 5/5 Thoracic Paraspinal Tenderness: T-6- T-8 Lower Extremities: Full ROM and Muscle Strength 5/5 Right Leg Flexion Produces Pain into Patella. No swelling or tenderness noted Arises from chair with ease Narrow based gait.  Neurological: She is alert and oriented to person, place, and time.  Skin: Skin is warm and dry.  Psychiatric: She has a normal mood and affect.          Assessment & Plan:  1.History of fibromyalgia. Continue Current Medication Regime and Exercise Regime.  2. Chronic low back pain. Myofascial pain. Continue Tramadol, Voltaren gel, Neurontin and Zanaflex  3. Rotator cuff syndrome./right bicipital tendonitis :  Continue with Exercise and Heat Therapy  4. Anxiety and depression. Continue Effexor and Klonopin.  20 minutes of face to face patient care time was spent during this visit. All questions were encouraged  and answered.  F/U in 1 month with Dr. Naaman Plummer

## 2014-02-26 ENCOUNTER — Telehealth: Payer: Self-pay | Admitting: *Deleted

## 2014-02-26 NOTE — Telephone Encounter (Signed)
Pt called to check on thyroid labwork. Talked with Dr Gordy Levan - new Rx for thyroid med sent to pharmacy 02/24/14. Pt c/o of feeling tired and sleepy past 3-4 days. Pt left phone # [phone] - N/A and left message on ID recording [phone]. Hilda Blades Xaria Judon RN 02/26/14 4:45PM

## 2014-02-28 ENCOUNTER — Telehealth: Payer: Self-pay | Admitting: *Deleted

## 2014-02-28 NOTE — Telephone Encounter (Signed)
Pt called clinic area looks sl better - now has a rash around the site and some itching. Offered today - prefers to wait till Mon.  Appt sch 03/03/14  1:15PM Dr Arcelia Jew. Hilda Blades Samual Beals RN 02/28/14 2:45PM

## 2014-03-03 ENCOUNTER — Ambulatory Visit: Payer: Medicare HMO | Admitting: Internal Medicine

## 2014-03-24 ENCOUNTER — Other Ambulatory Visit: Payer: Self-pay

## 2014-03-24 DIAGNOSIS — K219 Gastro-esophageal reflux disease without esophagitis: Secondary | ICD-10-CM

## 2014-03-24 MED ORDER — GABAPENTIN 300 MG PO CAPS: ORAL_CAPSULE | ORAL | Status: DC

## 2014-03-26 ENCOUNTER — Ambulatory Visit (INDEPENDENT_AMBULATORY_CARE_PROVIDER_SITE_OTHER): Payer: Medicare HMO | Admitting: Internal Medicine

## 2014-03-26 ENCOUNTER — Encounter: Payer: Self-pay | Admitting: Internal Medicine

## 2014-03-26 VITALS — BP 140/80 | HR 81 | Temp 97.7°F | Ht 66.0 in | Wt 200.0 lb

## 2014-03-26 DIAGNOSIS — E119 Type 2 diabetes mellitus without complications: Secondary | ICD-10-CM

## 2014-03-26 DIAGNOSIS — F411 Generalized anxiety disorder: Secondary | ICD-10-CM

## 2014-03-26 DIAGNOSIS — H9319 Tinnitus, unspecified ear: Secondary | ICD-10-CM | POA: Insufficient documentation

## 2014-03-26 DIAGNOSIS — Z23 Encounter for immunization: Secondary | ICD-10-CM

## 2014-03-26 DIAGNOSIS — I10 Essential (primary) hypertension: Secondary | ICD-10-CM

## 2014-03-26 DIAGNOSIS — E039 Hypothyroidism, unspecified: Secondary | ICD-10-CM

## 2014-03-26 LAB — CBC WITH DIFFERENTIAL/PLATELET
BASOS PCT: 0 % (ref 0–1)
Basophils Absolute: 0 10*3/uL (ref 0.0–0.1)
EOS ABS: 0.1 10*3/uL (ref 0.0–0.7)
Eosinophils Relative: 2 % (ref 0–5)
HCT: 36.7 % (ref 36.0–46.0)
HEMOGLOBIN: 12.4 g/dL (ref 12.0–15.0)
Lymphocytes Relative: 27 % (ref 12–46)
Lymphs Abs: 1.7 10*3/uL (ref 0.7–4.0)
MCH: 29.7 pg (ref 26.0–34.0)
MCHC: 33.8 g/dL (ref 30.0–36.0)
MCV: 87.8 fL (ref 78.0–100.0)
MONO ABS: 0.5 10*3/uL (ref 0.1–1.0)
MONOS PCT: 8 % (ref 3–12)
NEUTROS PCT: 63 % (ref 43–77)
Neutro Abs: 3.9 10*3/uL (ref 1.7–7.7)
Platelets: 306 10*3/uL (ref 150–400)
RBC: 4.18 MIL/uL (ref 3.87–5.11)
RDW: 13.7 % (ref 11.5–15.5)
WBC: 6.2 10*3/uL (ref 4.0–10.5)

## 2014-03-26 LAB — GLUCOSE, CAPILLARY: GLUCOSE-CAPILLARY: 92 mg/dL (ref 70–99)

## 2014-03-26 NOTE — Assessment & Plan Note (Signed)
Lab Results  Component Value Date   TSH 0.091* 02/21/2014     Assessment: Patient's levothyroxine dose was reduced in August from 125 mcg daily to current dose of 112 mcg daily.  Plan: Check TSH and free T4 today; continue levothyroxine 112 mcg daily pending those results.

## 2014-03-26 NOTE — Assessment & Plan Note (Signed)
Assessment: Patient reports increased anxiety due to the stress of having additional family members live with her and her husband.  She was previously followed by a psychiatrist but has not recently followup with her psychiatrist.  Plan: I advised patient that it may be beneficial to reestablish with her psychiatrist, and I advised her to make sure that any psychiatric care is coordinated with her pain physician Dr. Naaman Plummer.

## 2014-03-26 NOTE — Assessment & Plan Note (Addendum)
BP Readings from Last 3 Encounters:  03/26/14 140/80  02/24/14 156/84  02/21/14 151/89    Lab Results  Component Value Date   NA 139 07/17/2013   K 4.2 07/17/2013   CREATININE 0.69 07/17/2013    Assessment: Blood pressure control: controlled Progress toward BP goal:  at goal Comments: Blood pressure is controlled on losartan 25 mg daily  Plan: Medications:  Continue losartan 25 mg daily; check a metabolic panel today.

## 2014-03-26 NOTE — Progress Notes (Signed)
   Subjective:    Patient ID: [name] [name], female    DOB: 03-Dec-1945, 68 y.o.   MRN: [id number]  HPI Patient returns for followup and management of her hypertension, hypothyroidism, and diet-controlled diabetes mellitus, and other chronic medical problems.  Today her main complaint is recent increased stress in her living situation due to additional family members living with her and her husband at this time.  She mentioned several symptoms that seem to be provoked by stress, including epigastric discomfort, anxiety, and occasional headaches.  She has occasional mild substernal discomfort provoked by stress, but no overt chest pain.  She also reports that she has had self-limited nosebleeds twice within the past week.  She has not recently followed up with her psychiatrist; she does appear to be following regularly with Dr. Naaman Plummer for pain management.   Review of Systems  Respiratory: Negative for shortness of breath.   Cardiovascular: Negative for leg swelling.  Gastrointestinal: Negative for vomiting.  Genitourinary: Negative for dysuria.  Psychiatric/Behavioral: Negative for suicidal ideas.       Objective:   Physical Exam  Constitutional: No distress.  Cardiovascular: Normal rate and regular rhythm.  Exam reveals no gallop and no friction rub.   No murmur heard. Pulmonary/Chest: Effort normal and breath sounds normal. No respiratory distress. She has no wheezes. She has no rales.  Abdominal: Soft. Bowel sounds are normal. She exhibits no distension. There is no tenderness. There is no rebound and no guarding.  Musculoskeletal: She exhibits no edema.       Assessment & Plan:

## 2014-03-26 NOTE — Patient Instructions (Signed)
Continue current medications. 

## 2014-03-26 NOTE — Assessment & Plan Note (Signed)
Lab Results  Component Value Date   HGBA1C 5.6 01/27/2014   HGBA1C 5.6 11/06/2013   HGBA1C 5.6 07/17/2013     Assessment: Diabetes control: good control (HgbA1C at goal) Progress toward A1C goal:  at goal Comments: Diabetes is well controlled on diet alone  Plan: Medications:  Continue dietary management Home glucose monitoring: Frequency: no home glucose monitoring Timing: N/A

## 2014-03-27 LAB — COMPLETE METABOLIC PANEL WITH GFR
ALBUMIN: 4.5 g/dL (ref 3.5–5.2)
ALT: 19 U/L (ref 0–35)
AST: 16 U/L (ref 0–37)
Alkaline Phosphatase: 97 U/L (ref 39–117)
BILIRUBIN TOTAL: 0.3 mg/dL (ref 0.2–1.2)
BUN: 14 mg/dL (ref 6–23)
CHLORIDE: 103 meq/L (ref 96–112)
CO2: 25 meq/L (ref 19–32)
Calcium: 9.4 mg/dL (ref 8.4–10.5)
Creat: 0.67 mg/dL (ref 0.50–1.10)
GFR, Est African American: >89 mL/min
GFR, Est Non African American: >89 mL/min
Glucose, Bld: 90 mg/dL (ref 70–99)
POTASSIUM: 4.5 meq/L (ref 3.5–5.3)
SODIUM: 139 meq/L (ref 135–145)
Total Protein: 6.7 g/dL (ref 6.0–8.3)

## 2014-03-27 LAB — TSH: TSH: 0.351 u[IU]/mL (ref 0.350–4.500)

## 2014-03-27 LAB — T4, FREE: Free T4: 1.1 ng/dL (ref 0.80–1.80)

## 2014-04-01 ENCOUNTER — Encounter: Payer: Self-pay | Admitting: Licensed Clinical Social Worker

## 2014-04-04 ENCOUNTER — Other Ambulatory Visit: Payer: Self-pay | Admitting: Physical Medicine & Rehabilitation

## 2014-04-21 ENCOUNTER — Other Ambulatory Visit: Payer: Self-pay | Admitting: Internal Medicine

## 2014-04-22 NOTE — Addendum Note (Signed)
Addended by: Hulan Fray on: 04/22/2014 09:59 PM   Modules accepted: Orders

## 2014-05-02 ENCOUNTER — Ambulatory Visit: Payer: Medicare HMO | Admitting: Family Medicine

## 2014-05-06 ENCOUNTER — Other Ambulatory Visit: Payer: Self-pay | Admitting: Internal Medicine

## 2014-05-26 ENCOUNTER — Other Ambulatory Visit: Payer: Self-pay | Admitting: Physical Medicine & Rehabilitation

## 2014-05-26 ENCOUNTER — Encounter: Payer: Medicare HMO | Attending: Physical Medicine and Rehabilitation | Admitting: Physical Medicine & Rehabilitation

## 2014-05-26 ENCOUNTER — Encounter: Payer: Self-pay | Admitting: Physical Medicine & Rehabilitation

## 2014-05-26 VITALS — BP 151/88 | HR 79 | Resp 14 | Ht 66.0 in | Wt 204.0 lb

## 2014-05-26 DIAGNOSIS — M19011 Primary osteoarthritis, right shoulder: Secondary | ICD-10-CM

## 2014-05-26 DIAGNOSIS — Z5181 Encounter for therapeutic drug level monitoring: Secondary | ICD-10-CM | POA: Diagnosis present

## 2014-05-26 DIAGNOSIS — M75101 Unspecified rotator cuff tear or rupture of right shoulder, not specified as traumatic: Secondary | ICD-10-CM

## 2014-05-26 DIAGNOSIS — M791 Myalgia: Secondary | ICD-10-CM

## 2014-05-26 DIAGNOSIS — Z79899 Other long term (current) drug therapy: Secondary | ICD-10-CM | POA: Diagnosis present

## 2014-05-26 DIAGNOSIS — M609 Myositis, unspecified: Secondary | ICD-10-CM

## 2014-05-26 NOTE — Patient Instructions (Signed)
REMEMBER TO WORK ON YOUR POSTURE AND REGULAR EXERCISE   TAKE REST BREAKS WITH YOUR Gate!!!!

## 2014-05-26 NOTE — Addendum Note (Signed)
Addended by: Valeria Batman on: 05/26/2014 12:45 PM   Modules accepted: Orders

## 2014-05-26 NOTE — Progress Notes (Signed)
Subjective:    Patient ID: [name] [name], female    DOB: 15-Oct-1945, 68 y.o.   MRN: [id number]  HPI Joice is back regarding her chronic pain. She is having more right shoulder and arm pain. She also reports increased nose bleeding and headaches. She denies any issues with her sinuses. Her headaches take place weekly. THey tend to last for a couple days and then resolve. The nose bleeding tends to happen at the end of the headache cycle. She has had a history of nose bleeds.   She continues to sew regularly, and now she has had some grandchildren running out of the house.   Zitlaly feels more depressed in general. She is hesitant to leave the house or exercise.  Pain Inventory Average Pain 9 Pain Right Now 7 My pain is constant and feels like being pulled  In the last 24 hours, has pain interfered with the following? General activity 8 Relation with others 8 Enjoyment of life 8 What TIME of day is your pain at its worst? morning, daytime, evening, night Sleep (in general) Fair  Pain is worse with: bending, inactivity and some activites Pain improves with: rest, heat/ice, therapy/exercise, medication and injections Relief from Meds: 6  Mobility use a cane use a walker how many minutes can you walk? 15 ability to climb steps?  yes do you drive?  yes Do you have any goals in this area?  yes  Function Do you have any goals in this area?  no  Neuro/Psych bladder control problems  Prior Studies Any changes since last visit?  no  Physicians involved in your care Any changes since last visit?  no   Family History  Problem Relation Age of Onset  . Cancer      breast -- grandmother  . Cancer      lung -- uncles (3)  . Leukemia Sister   . Diabetes Father   . Diabetes Mother   . Coronary artery disease Father    History   Social History  . Marital Status: Married    Spouse Name: N/A    Number of Children: N/A  . Years of Education: N/A   Social History Main  Topics  . Smoking status: Never Smoker   . Smokeless tobacco: Never Used  . Alcohol Use: No  . Drug Use: No  . Sexual Activity: None   Other Topics Concern  . None   Social History Narrative   Past Surgical History  Procedure Laterality Date  . Cholecystectomy    . Abdominal hysterectomy  1984  . Breast surgery    . Cardiac catheterization    . Thyroidectomy    . Neuromas      removed from feet  . Wrist surgery      left  . Orif ankle fracture  01/10/2012    Procedure: OPEN REDUCTION INTERNAL FIXATION (ORIF) ANKLE FRACTURE;  Surgeon: Sanjuana Kava, MD;  Location: [facility];  Service: Orthopedics;  Laterality: Left;   Past Medical History  Diagnosis Date  . DM type 2 (diabetes mellitus, type 2)   . TIA (transient ischemic attack) 2010  . Anxiety   . GERD (gastroesophageal reflux disease)   . HTN (hypertension)   . Hypothyroidism   . Depression   . Allergic rhinitis   . Fibromyalgia   . Hemorrhoids   . Migraine headache   . Chest pain     Catheterization 2004 normal coronary arteries  /  nuclear August 2 007, no  ischemia  . Joint pain     Pains in multiple joints  . Shortness of breath     March, 2012  . Elevated alkaline phosphatase level   . Dysphagia   . Hiatal hernia   . Abdominal pain, RUQ (right upper quadrant)   . Hypercholesteremia   . Hematochezia   . UTI (lower urinary tract infection)   . Cervical radiculopathy   . Rotator cuff syndrome of right shoulder   . Chronic neck pain   . Chronic back pain     R > L  . Chronic shoulder pain, right   . Lumbar radicular pain     R>L legs  . History of ankle fracture 01/08/2012    Status post open reduction internal fixation of a left ankle trimalleolar fracture by Dr. Iona Hansen on 01/10/2012.     There were no vitals taken for this visit.  Opioid Risk Score:   Fall Risk Score: Low Fall Risk (0-5 points) Review of Systems  Constitutional: Positive for fever.       Night sweats  Respiratory: Positive  for cough and shortness of breath.        Sinus pain/ infection  Genitourinary: Positive for urgency and frequency.       Incontinence  Skin: Positive for rash.  All other systems reviewed and are negative.      Objective:   Physical Exam  Constitutional: She is oriented to person, place, and time. She appears well-developed and well-nourished.  HENT:  Head: Normocephalic and atraumatic.  Eyes: Conjunctivae and EOM are normal. Pupils are equal, round, and reactive to light.  Neck: Normal range of motion.  Cardiovascular: Normal rate and regular rhythm.  Pulmonary/Chest: Effort normal.  Abdominal: Soft. Bowel sounds are normal.  Musculoskeletal:  Patient has  right rotator cuff impingement signs which were positive. Pain noted at right subacromial space with palpation..   .  Cervical paraspinals, upper traps on right tighter than left. She had tenderness with palpation.   TPI's noted on upper and mid trap Right.  Head forward posture. Neurological: She is alert and oriented to person, place, and time. No weakness or obvious sensory changes are noted.   With gait she favors the left leg in weight bearing slightly.   Assessment & Plan:   ASSESSMENT:  1. History of fibromyalgia with myofascial pain. 2. Chronic low back pain with lumbar disc and facet disease.   3. Right otator cuff syndrome./right bicipital tendonitis  4. Anxiety and depression.  5. Cervical stenosis with left C7 radiculopathy  6. Hx of fall with left trimalleolar ankle fx.  7. Left greater troch bursitis  8. Left and right wrist pain, extensor tendonitis    PLAN:  1. After informed consent and preparation of the skin with isopropyl alcohol, I injected the RIGHT upper and mid trap (2 injtections) with 2cc of 1% lidocaine. The patient tolerated well, and no complications were experienced. Post-injection instructions were provided. Classic twitch response was seen. Discussed the importance of posture and regular  stretching. We have discussed this NUMEROUS times.  2. After informed consent and preparation of the skin with betadine and isopropyl alcohol, I injected 6mg  (1cc) of celestone and 4cc of 1% lidocaine into the right subacromial space via lateral approach. Additionally, aspiration was performed prior to injection. The patient tolerated well, and no complications were encountered. Afterward the area was cleaned and dressed. Post- injection instructions were provided.    3. Continue with neurontin for her  general FMS pain. Continue klonopin for sleep/ hs anxiety  4. Continue with Zanaflex for spasms and back pain.--again want her to take 2mg  TID  5. Consider MBB's for lumbar spine based upon xr findings  6. Continue effexor and wellbutrin.  7. Discussed her nose bleeds, which have likely increased due to the dry air. She needs to work on keeping her nose moist. She also needs to follow up with optho regarding her eyes.   7. All questions were encouraged and answered. i will see her back in 3 months. 30 minutes were spent with the patient today.          Assessment & Plan:

## 2014-05-27 LAB — PMP ALCOHOL METABOLITE (ETG): Ethyl Glucuronide (EtG): NEGATIVE ng/mL

## 2014-05-28 LAB — PRESCRIPTION MONITORING PROFILE (SOLSTAS)
Amphetamine/Meth: NEGATIVE ng/mL
BARBITURATE SCREEN, URINE: NEGATIVE ng/mL
BENZODIAZEPINE SCREEN, URINE: NEGATIVE ng/mL
Buprenorphine, Urine: NEGATIVE ng/mL
Cannabinoid Scrn, Ur: NEGATIVE ng/mL
Carisoprodol, Urine: NEGATIVE ng/mL
Cocaine Metabolites: NEGATIVE ng/mL
Creatinine, Urine: 27.17 mg/dL (ref >20.0)
Fentanyl, Ur: NEGATIVE ng/mL
MDMA URINE: NEGATIVE ng/mL
Meperidine, Ur: NEGATIVE ng/mL
Methadone Screen, Urine: NEGATIVE ng/mL
NITRITES URINE, INITIAL: NEGATIVE ug/mL
OPIATE SCREEN, URINE: NEGATIVE ng/mL
Oxycodone Screen, Ur: NEGATIVE ng/mL
PROPOXYPHENE: NEGATIVE ng/mL
TAPENTADOLUR: NEGATIVE ng/mL
Tramadol Scrn, Ur: NEGATIVE ng/mL
ZOLPIDEM, URINE: NEGATIVE ng/mL
pH, Initial: 6 pH (ref 4.5–8.9)

## 2014-05-30 ENCOUNTER — Ambulatory Visit: Payer: Medicare HMO | Admitting: Family Medicine

## 2014-06-19 ENCOUNTER — Other Ambulatory Visit: Payer: Self-pay | Admitting: Physical Medicine & Rehabilitation

## 2014-06-23 ENCOUNTER — Other Ambulatory Visit: Payer: Self-pay | Admitting: *Deleted

## 2014-07-15 ENCOUNTER — Other Ambulatory Visit: Payer: Self-pay | Admitting: Registered Nurse

## 2014-07-16 ENCOUNTER — Other Ambulatory Visit: Payer: Self-pay | Admitting: Physical Medicine & Rehabilitation

## 2014-07-24 ENCOUNTER — Telehealth: Payer: Self-pay | Admitting: Internal Medicine

## 2014-07-24 ENCOUNTER — Ambulatory Visit (HOSPITAL_COMMUNITY)
Admission: RE | Admit: 2014-07-24 | Discharge: 2014-07-24 | Disposition: A | Discharge status: Home / Self Care | Payer: Medicare HMO | Source: Ambulatory Visit | Attending: Internal Medicine | Admitting: Internal Medicine

## 2014-07-24 ENCOUNTER — Encounter: Payer: Self-pay | Admitting: Internal Medicine

## 2014-07-24 ENCOUNTER — Telehealth: Payer: Self-pay

## 2014-07-24 ENCOUNTER — Ambulatory Visit (INDEPENDENT_AMBULATORY_CARE_PROVIDER_SITE_OTHER): Payer: Medicare HMO | Admitting: Internal Medicine

## 2014-07-24 VITALS — BP 144/73 | HR 78 | Temp 97.9°F | Ht 66.0 in | Wt 204.1 lb

## 2014-07-24 DIAGNOSIS — X58XXXA Exposure to other specified factors, initial encounter: Secondary | ICD-10-CM | POA: Insufficient documentation

## 2014-07-24 DIAGNOSIS — S93501A Unspecified sprain of right great toe, initial encounter: Secondary | ICD-10-CM | POA: Diagnosis present

## 2014-07-24 DIAGNOSIS — S92401A Displaced unspecified fracture of right great toe, initial encounter for closed fracture: Secondary | ICD-10-CM | POA: Insufficient documentation

## 2014-07-24 DIAGNOSIS — M797 Fibromyalgia: Secondary | ICD-10-CM

## 2014-07-24 DIAGNOSIS — E7800 Pure hypercholesterolemia, unspecified: Secondary | ICD-10-CM

## 2014-07-24 DIAGNOSIS — S92421A Displaced fracture of distal phalanx of right great toe, initial encounter for closed fracture: Secondary | ICD-10-CM

## 2014-07-24 DIAGNOSIS — K219 Gastro-esophageal reflux disease without esophagitis: Secondary | ICD-10-CM

## 2014-07-24 DIAGNOSIS — I10 Essential (primary) hypertension: Secondary | ICD-10-CM

## 2014-07-24 DIAGNOSIS — E119 Type 2 diabetes mellitus without complications: Secondary | ICD-10-CM

## 2014-07-24 DIAGNOSIS — Z23 Encounter for immunization: Secondary | ICD-10-CM

## 2014-07-24 LAB — POCT GLYCOSYLATED HEMOGLOBIN (HGB A1C): Hemoglobin A1C: 5.8

## 2014-07-24 LAB — GLUCOSE, CAPILLARY: Glucose-Capillary: 105 mg/dL — ABNORMAL HIGH (ref 70–99)

## 2014-07-24 NOTE — Assessment & Plan Note (Signed)
Lipids:    Component Value Date/Time   CHOL 167 11/06/2013 0957   TRIG 183* 11/06/2013 0957   HDL 67 11/06/2013 0957   LDLCALC 63 11/06/2013 0957   VLDL 37 11/06/2013 0957   CHOLHDL 2.5 11/06/2013 0957   Assessment: LDL is at goal on pravastatin 20 mg daily.  Plan: Continue pravastatin 20 mg daily

## 2014-07-24 NOTE — Assessment & Plan Note (Addendum)
Lab Results  Component Value Date   HGBA1C 5.8 07/24/2014   HGBA1C 5.6 01/27/2014   HGBA1C 5.6 11/06/2013    Assessment: Diabetes control: good control (HgbA1C at goal) Progress toward A1C goal:  at goal Comments: Hemoglobin A1c is at goal on dietary management alone  Plan: Medications:  Continue dietary management Home glucose monitoring: Frequency: no home glucose monitoring Timing: N/A Instruction/counseling given: reminded to get eye exam

## 2014-07-24 NOTE — Telephone Encounter (Signed)
    Reason for call:   I received a call from [name] at 10:00 PM indicating that the clinic had called her earlier today. She was seen seen today by Dr. Marinda Elk and found to have right toe fracture. Dr. Marinda Elk tried to get in contact with her to inform her of the results but unfortunately was unable to reach her on her current listed phone number in her chart. He spoke with orthopedic surgeon Dr. Erlinda Hong and recommended she see him tomorrow or early next week to be fitted for a hard soled shoe.Dr. Phoebe Sharps scheduler was also unable to reach her.    Pertinent Data:  Right great toe xray on 07/24/13 reveals posterior displaced fracture of the base of the distal phalanx of the right great toe with extension into the joint space.    Assessment / Plan / Recommendations:   I informed [name] [name] of her xray findings and that she needs to call Dr. Phoebe Sharps office (which I gave the phone number and address) tomorrow morning for an appointment. The best number to reach her is [phone] and her husband's number is [phone], I updated these phone numbers in her chart. I will route this note to her PCP Dr. Marinda Elk and his nurse [facility].      Juluis Mire, MD   07/24/2014, 10:17 PM

## 2014-07-24 NOTE — Assessment & Plan Note (Signed)
Dg Toe Great Right  07/24/2014   CLINICAL DATA:  Trauma 3 weeks ago. Prior dislocation. Initial evaluation.  EXAM: RIGHT GREAT TOE  COMPARISON:  None.  FINDINGS: Posteriorly displaced fracture of the base of the distal phalanx of the right great toe is noted. Fracture extends into the distal interphalangeal joint. No other focal abnormality identified.  IMPRESSION: Posterior displaced fracture of the base of the distal phalanx of the right great toe. Fracture extends into the joint space.   Electronically Signed   By: Marcello Moores  Register   On: 07/24/2014 15:35     Assessment: Patient presents with complaint of right great toe swelling and erythema which has persisted following a fall about 3 weeks ago, in which her toe buckled under her when she tripped.  Exam shows no tenderness.  X-ray as above shows a posterior displaced fracture of the base of the distal phalanx of the right great toe, and the fracture extends into the joint space.  Patient had left the hospital today prior to the reading of the toe x-ray.  I spoke with the on-call orthopedic surgeon Dr. Erlinda Hong by phone, and he reviewed the x-ray and recommended that she follow-up in his office tomorrow or early next week and indicated that he will manage the fracture with a hard soled shoe.    Plan: I called Dr. Phoebe Sharps office at [facility] and gave the information to their scheduler to arrange an appointment for tomorrow; I was unable to reach patient today at the phone numbers provided in her chart, and I left a message for her to call the clinic.  [employer] also was unable to reach her today and left a message.  I will try again to contact patient tomorrow to discuss the X-ray findings and ensure follow-up plans.

## 2014-07-24 NOTE — Assessment & Plan Note (Signed)
Assessment: Patient's fibromyalgia/chronic pain is managed by Dr. Naaman Plummer in the pain clinic, and she appears to be doing well.    Plan: Follow-up with Dr. Naaman Plummer in February as scheduled

## 2014-07-24 NOTE — Progress Notes (Signed)
   Subjective:    Patient ID: [name] [name], female    DOB: Mar 10, 1946, 69 y.o.   MRN: [id number]  HPI Patient presents for management of her diet-controlled diabetes mellitus, hypertension, hypercholesterolemia, and other chronic medical problems.  She complains of swelling of her right great toe following an injury about 3 weeks ago when she tripped and fell and her toe turned under; she did not seek medical care at that time.  The toe was initially painful, but her pain has completely resolved.  She has no other complaints.  She reports that she has been compliant with her medications.  Her chronic pain is managed by Dr. Naaman Plummer in the pain clinic.   Review of Systems  Musculoskeletal: Positive for joint swelling (Right great toe).       Objective:   Physical Exam  Constitutional: No distress.  Cardiovascular: Normal rate, regular rhythm and normal heart sounds.  Exam reveals no gallop and no friction rub.   No murmur heard. No leg edema  Pulmonary/Chest: Effort normal and breath sounds normal. No respiratory distress. She has no wheezes. She has no rales.  Abdominal: Soft. Bowel sounds are normal. She exhibits no distension. There is no tenderness. There is no rebound and no guarding.  Musculoskeletal:       Feet:        Assessment & Plan:

## 2014-07-24 NOTE — Patient Instructions (Signed)
Continue current medications. 

## 2014-07-24 NOTE — Assessment & Plan Note (Signed)
BP Readings from Last 3 Encounters:  07/24/14 144/73  05/26/14 151/88  03/26/14 140/80    Lab Results  Component Value Date   NA 139 03/26/2014   K 4.5 03/26/2014   CREATININE 0.67 03/26/2014    Assessment: Blood pressure control: mildly elevated Progress toward BP goal:  unchanged Comments: Systolic blood pressure is only slightly above goal on losartan 50 mg daily.  Plan: Medications:  continue current medications Other plans: Reassess upon return.

## 2014-07-24 NOTE — Assessment & Plan Note (Signed)
Assessment: Symptoms are well controlled on omeprazole 40 mg twice a day and hyoscyamine 0.125 mg 1-2 tablets sublingual every 4 hours as needed for chest pain (she takes this occasionally but not frequently).  Patient reports that her symptoms do recur when she tries reducing the omeprazole dose to once daily.  Plan: Continue current medications.

## 2014-07-25 ENCOUNTER — Telehealth: Payer: Self-pay | Admitting: Internal Medicine

## 2014-07-25 NOTE — Telephone Encounter (Signed)
I reached patient this morning by telephone at [phone] and discussed the finding of right great toe fracture on yesterday's x-ray; she also was informed of this last night when she reached the on-call resident Dr. Naaman Plummer (see Dr. Harley Hallmark telephone note.)  I advised patient to call Dr. Phoebe Sharps office ([place] [phone]) right away this morning and schedule an appointment to be seen today, and she agreed to do so.  I also advised her to protect the toe until she is seen by Dr. Erlinda Hong.

## 2014-07-28 ENCOUNTER — Encounter: Payer: Medicare HMO | Attending: Physical Medicine and Rehabilitation | Admitting: Physical Medicine & Rehabilitation

## 2014-07-28 ENCOUNTER — Encounter: Payer: Self-pay | Admitting: Physical Medicine & Rehabilitation

## 2014-07-28 VITALS — BP 156/86 | HR 85 | Resp 14

## 2014-07-28 DIAGNOSIS — M47816 Spondylosis without myelopathy or radiculopathy, lumbar region: Secondary | ICD-10-CM

## 2014-07-28 DIAGNOSIS — Z5181 Encounter for therapeutic drug level monitoring: Secondary | ICD-10-CM | POA: Insufficient documentation

## 2014-07-28 DIAGNOSIS — M5412 Radiculopathy, cervical region: Secondary | ICD-10-CM

## 2014-07-28 DIAGNOSIS — S92401S Displaced unspecified fracture of right great toe, sequela: Secondary | ICD-10-CM

## 2014-07-28 DIAGNOSIS — M19011 Primary osteoarthritis, right shoulder: Secondary | ICD-10-CM | POA: Insufficient documentation

## 2014-07-28 DIAGNOSIS — M797 Fibromyalgia: Secondary | ICD-10-CM

## 2014-07-28 DIAGNOSIS — E119 Type 2 diabetes mellitus without complications: Secondary | ICD-10-CM

## 2014-07-28 DIAGNOSIS — Z79899 Other long term (current) drug therapy: Secondary | ICD-10-CM | POA: Diagnosis present

## 2014-07-28 MED ORDER — HYDROCODONE-ACETAMINOPHEN 5-325 MG PO TABS
1.0000 | ORAL_TABLET | Freq: Three times a day (TID) | ORAL | Status: DC | PRN
Start: 2014-07-28 — End: 2014-08-26

## 2014-07-28 NOTE — Progress Notes (Signed)
Subjective:    Patient ID: [name] [name], female    DOB: [birth year], 69 y.o.   MRN: [id number]  HPI   [name] [name] is back regarding her chronic pain. She broke her right first toe a couple weeks ago after dislocating. She has had increased pain since then in the right foot and the left foot has been bothering her more as well. Xrays of the right foot revealed a proximal fracture at the base of the 1st distal phalanx. Swelling has improved dramatically as well as pain, but pain is still an issue.    She saw [facility] who recommended conservative care and pain mgt. Tramadol is not controlling the pain. She cannot take nsaids      Pain Inventory Average Pain 6 Pain Right Now 6 My pain is constant  In the last 24 hours, has pain interfered with the following? General activity 6 Relation with others 6 Enjoyment of life 6 What TIME of day is your pain at its worst? all Sleep (in general) Fair  Pain is worse with: walking and standing Pain improves with: rest Relief from Meds: 3  Mobility walk with assistance  Function I need assistance with the following:  meal prep, household duties and shopping  Neuro/Psych trouble walking  Prior Studies Any changes since last visit?  yes x-rays  Physicians involved in your care Orthopedist Dr Erlinda Hong   Family History  Problem Relation Age of Onset  . Cancer      breast -- grandmother  . Cancer      lung -- uncles (3)  . Leukemia Sister   . Diabetes Father   . Diabetes Mother   . Coronary artery disease Father    History   Social History  . Marital Status: Married    Spouse Name: N/A    Number of Children: N/A  . Years of Education: N/A   Social History Main Topics  . Smoking status: Never Smoker   . Smokeless tobacco: Never Used  . Alcohol Use: No  . Drug Use: No  . Sexual Activity: None   Other Topics Concern  . None   Social History Narrative   Past Surgical History  Procedure Laterality Date  .  Cholecystectomy    . Abdominal hysterectomy  1984  . Breast surgery    . Cardiac catheterization    . Thyroidectomy    . Neuromas      removed from feet  . Wrist surgery      left  . Orif ankle fracture  01/10/2012    Procedure: OPEN REDUCTION INTERNAL FIXATION (ORIF) ANKLE FRACTURE;  Surgeon: Sanjuana Kava, MD;  Location: [facility];  Service: Orthopedics;  Laterality: Left;   Past Medical History  Diagnosis Date  . DM type 2 (diabetes mellitus, type 2)   . TIA (transient ischemic attack) 2010  . Anxiety   . GERD (gastroesophageal reflux disease)   . HTN (hypertension)   . Hypothyroidism   . Depression   . Allergic rhinitis   . Fibromyalgia   . Hemorrhoids   . Migraine headache   . Chest pain     Catheterization 2004 normal coronary arteries  /  nuclear August 2 007, no ischemia  . Joint pain     Pains in multiple joints  . Shortness of breath     March, 2012  . Elevated alkaline phosphatase level   . Dysphagia   . Hiatal hernia   . Abdominal pain, RUQ (right upper quadrant)   .  Hypercholesteremia   . Hematochezia   . UTI (lower urinary tract infection)   . Cervical radiculopathy   . Rotator cuff syndrome of right shoulder   . Chronic neck pain   . Chronic back pain     R > L  . Chronic shoulder pain, right   . Lumbar radicular pain     R>L legs  . History of ankle fracture 01/08/2012    Status post open reduction internal fixation of a left ankle trimalleolar fracture by Dr. Iona Hansen on 01/10/2012.     BP 156/86 mmHg  Pulse 85  Resp 14  SpO2 99%  Opioid Risk Score:   Fall Risk Score: Moderate Fall Risk (6-13 points) (previoulsy educated and given handout)  Review of Systems  Musculoskeletal: Positive for gait problem.  All other systems reviewed and are negative.      Objective:   Physical Exam  Constitutional: She is oriented to person, place, and time. She appears well-developed and well-nourished.  HENT:  Head: Normocephalic and atraumatic.    Eyes: Conjunctivae and EOM are normal. Pupils are equal, round, and reactive to light.  Neck: Normal range of motion.  Cardiovascular: Normal rate and regular rhythm.  Pulmonary/Chest: Effort normal.  Abdominal: Soft. Bowel sounds are normal.  Musculoskeletal:  Patient has right rotator cuff impingement signs which were positive. Pain noted at right subacromial space with palpation..  .  Cervical paraspinals, upper traps on right tighter than left. She had tenderness with palpation. TPI's noted on upper and mid trap Right. Head forward posture.  Neurological: She is alert and oriented to person, place, and time. No weakness or obvious sensory changes are noted.  With gait she favors the left leg in weight bearing slightly.   Assessment & Plan:   ASSESSMENT:  1. History of fibromyalgia with myofascial pain.  2. Chronic low back pain with lumbar disc and facet disease.  3. Right otator cuff syndrome./right bicipital tendonitis  4. Anxiety and depression.  5. Cervical stenosis with left C7 radiculopathy  6. Hx of fall with left trimalleolar ankle fx.  7. Left greater troch bursitis  8. Left and right wrist pain, extensor tendonitis    PLAN:  1. Will rx hydrocodone 5/325 for breakthrough pain. Stop tramadol for now 2. Recommend improved shoe wear left foot to provide more shock absorption and support (tennis shoe) 3. Continue with neurontin for her general FMS pain. Continue klonopin for sleep/ hs anxiety  4. Continue with Zanaflex for spasms and back pain. 5. Discussed ice/rom/stretching for both feet. An ankle gauntlet would also be a good idea---she will purchase. .  6. Continue effexor and wellbutrin.  7. Extensive counseling was provided.    8. All questions were encouraged and answered. i will see her back in 3 months. 30 minutes were spent with the patient today.

## 2014-07-28 NOTE — Patient Instructions (Addendum)
1. TRY TO WEAR A SUPPORTIVE SHOE FOR LEFT FOOT/ANKLE-- 2. ICE AS NEEDED FOR PAIN AFTER YOU WALK 3. TRY HEAT TO HELP STRETCH BEFORE YOU MOVE 4. CAN BUDDY TAPE TWO LEFT TOES TO HELP WITH SUPPORT---TRY TO MOVE TO 2 LIKE SHOES THAT ARE SUPPORTIVE ASAP.

## 2014-08-11 ENCOUNTER — Other Ambulatory Visit: Payer: Self-pay | Admitting: Internal Medicine

## 2014-08-11 ENCOUNTER — Other Ambulatory Visit: Payer: Self-pay | Admitting: Registered Nurse

## 2014-08-14 ENCOUNTER — Other Ambulatory Visit: Payer: Self-pay | Admitting: Registered Nurse

## 2014-08-26 ENCOUNTER — Encounter: Payer: Self-pay | Admitting: Physical Medicine & Rehabilitation

## 2014-08-26 ENCOUNTER — Other Ambulatory Visit: Payer: Self-pay | Admitting: Physical Medicine & Rehabilitation

## 2014-08-26 ENCOUNTER — Encounter: Payer: Medicare HMO | Attending: Physical Medicine and Rehabilitation | Admitting: Physical Medicine & Rehabilitation

## 2014-08-26 VITALS — HR 120 | Resp 16

## 2014-08-26 DIAGNOSIS — Z5181 Encounter for therapeutic drug level monitoring: Secondary | ICD-10-CM | POA: Diagnosis present

## 2014-08-26 DIAGNOSIS — F418 Other specified anxiety disorders: Secondary | ICD-10-CM | POA: Insufficient documentation

## 2014-08-26 DIAGNOSIS — M19011 Primary osteoarthritis, right shoulder: Secondary | ICD-10-CM | POA: Diagnosis present

## 2014-08-26 DIAGNOSIS — K219 Gastro-esophageal reflux disease without esophagitis: Secondary | ICD-10-CM

## 2014-08-26 DIAGNOSIS — R1314 Dysphagia, pharyngoesophageal phase: Secondary | ICD-10-CM | POA: Insufficient documentation

## 2014-08-26 DIAGNOSIS — Z79899 Other long term (current) drug therapy: Secondary | ICD-10-CM | POA: Diagnosis present

## 2014-08-26 DIAGNOSIS — G894 Chronic pain syndrome: Secondary | ICD-10-CM

## 2014-08-26 DIAGNOSIS — M797 Fibromyalgia: Secondary | ICD-10-CM

## 2014-08-26 DIAGNOSIS — K224 Dyskinesia of esophagus: Secondary | ICD-10-CM

## 2014-08-26 MED ORDER — CLONAZEPAM 1 MG PO TABS: ORAL_TABLET | ORAL | Status: DC

## 2014-08-26 MED ORDER — HYDROCODONE-ACETAMINOPHEN 5-325 MG PO TABS: 1.0000 | ORAL_TABLET | Freq: Three times a day (TID) | ORAL | Status: DC | PRN

## 2014-08-26 MED ORDER — VENLAFAXINE HCL 100 MG PO TABS: 100.0000 mg | ORAL_TABLET | Freq: Three times a day (TID) | ORAL | Status: DC

## 2014-08-26 NOTE — Progress Notes (Signed)
Subjective:    Patient ID: [name] [name], female    DOB: [birth year], 69 y.o.   MRN: [id number]  HPI   She is here in follow up of her chronic pain. She has had sudden onset of anxiety and depression after running out of her effexor last week. She is no longer using the wellbutrin (ran out of this in January!).  Her other problem is increased difficulties swallowing for both solid and liquids. She has had a history of esophageal dysmotility. I looked back and she's had two barium swallows but not an MBS. One study question difficulties with oral phase of swallowing. She complains of reflux as well. She takes two omeprazoles daily. She has seen Dr. Henrene Pastor in the past.   For pain she's taking hydrocodone    Pain Inventory Average Pain 8 Pain Right Now 4 My pain is constant, sharp, burning and stabbing  In the last 24 hours, has pain interfered with the following? General activity 2 Relation with others 2 Enjoyment of life 6 What TIME of day is your pain at its worst? daytime and night  Sleep (in general) Poor  Pain is worse with: sitting, inactivity and standing Pain improves with: rest, heat/ice, therapy/exercise and medication Relief from Meds: 4  Mobility walk without assistance how many minutes can you walk? 20 ability to climb steps?  yes do you drive?  yes  Function disabled: date disabled .  Neuro/Psych bladder control problems bowel control problems weakness numbness tremor depression anxiety  Prior Studies Any changes since last visit?  no  Physicians involved in your care Any changes since last visit?  no   Family History  Problem Relation Age of Onset  . Cancer      breast -- grandmother  . Cancer      lung -- uncles (3)  . Leukemia Sister   . Diabetes Father   . Diabetes Mother   . Coronary artery disease Father    History   Social History  . Marital Status: Married    Spouse Name: N/A  . Number of Children: N/A  . Years of Education:  N/A   Social History Main Topics  . Smoking status: Never Smoker   . Smokeless tobacco: Never Used  . Alcohol Use: No  . Drug Use: No  . Sexual Activity: Not on file   Other Topics Concern  . None   Social History Narrative   Past Surgical History  Procedure Laterality Date  . Cholecystectomy    . Abdominal hysterectomy  1984  . Breast surgery    . Cardiac catheterization    . Thyroidectomy    . Neuromas      removed from feet  . Wrist surgery      left  . Orif ankle fracture  01/10/2012    Procedure: OPEN REDUCTION INTERNAL FIXATION (ORIF) ANKLE FRACTURE;  Surgeon: Sanjuana Kava, MD;  Location: [facility];  Service: Orthopedics;  Laterality: Left;   Past Medical History  Diagnosis Date  . DM type 2 (diabetes mellitus, type 2)   . TIA (transient ischemic attack) 2010  . Anxiety   . GERD (gastroesophageal reflux disease)   . HTN (hypertension)   . Hypothyroidism   . Depression   . Allergic rhinitis   . Fibromyalgia   . Hemorrhoids   . Migraine headache   . Chest pain     Catheterization 2004 normal coronary arteries  /  nuclear August 2 007, no ischemia  . Joint  pain     Pains in multiple joints  . Shortness of breath     March, 2012  . Elevated alkaline phosphatase level   . Dysphagia   . Hiatal hernia   . Abdominal pain, RUQ (right upper quadrant)   . Hypercholesteremia   . Hematochezia   . UTI (lower urinary tract infection)   . Cervical radiculopathy   . Rotator cuff syndrome of right shoulder   . Chronic neck pain   . Chronic back pain     R > L  . Chronic shoulder pain, right   . Lumbar radicular pain     R>L legs  . History of ankle fracture 01/08/2012    Status post open reduction internal fixation of a left ankle trimalleolar fracture by Dr. Iona Hansen on 01/10/2012.     Pulse 120  Resp 16  SpO2 96%  Opioid Risk Score:   Fall Risk Score: Low Fall Risk (0-5 points)  Review of Systems  HENT: Negative.   Eyes: Negative.   Respiratory:  Negative.   Cardiovascular: Negative.   Gastrointestinal: Negative.   Endocrine: Negative.   Genitourinary: Negative.   Musculoskeletal: Positive for myalgias, back pain and arthralgias.  Skin: Negative.   Allergic/Immunologic: Negative.   Neurological: Positive for tremors, weakness and numbness.  Hematological: Negative.   Psychiatric/Behavioral: Positive for dysphoric mood, decreased concentration and agitation. The patient is nervous/anxious.        Objective:   Physical Exam   Constitutional: She is oriented to person, place, and time. She appears well-developed and well-nourished.  HENT: vocal quality appears normal. No coughing.  Head: Normocephalic and atraumatic.  Eyes: Conjunctivae and EOM are normal. Pupils are equal, round, and reactive to light.  Neck: Normal range of motion.  Cardiovascular: Normal rate and regular rhythm.  Pulmonary/Chest: Effort normal.  Abdominal: Soft. Bowel sounds are normal.  Musculoskeletal:  Patient has right rotator cuff impingement signs which were positive. Pain noted at right subacromial space with palpation..  .  Cervical paraspinals, upper traps on right tighter than left. She had tenderness with palpation.   Head forward posture.  Neurological: She is alert and oriented to person, place, and time. No weakness or obvious sensory changes are noted. Normal cough With gait she favors the left leg in weight bearing slightly.  Assessment & Plan:   ASSESSMENT:  1. History of fibromyalgia with myofascial pain.  2. Chronic low back pain with lumbar disc and facet disease.  3. Right otator cuff syndrome./right bicipital tendonitis  4. Anxiety and depression.  5. Cervical stenosis with left C7 radiculopathy  6. Hx of fall with left trimalleolar ankle fx.  7. Left greater troch bursitis  8. Left and right wrist pain, extensor tendonitis  9. Persistent swallowing difficulties, ?dysphagia--oral vs pharyngeal vs esophageal   PLAN:  1.   hydrocodone 5/325 for breakthrough pain.    2. Ordered MBS to further assess swallow----she needs to follow up with GI also 3. Continue with neurontin for her general FMS pain. Continue klonopin for sleep/ hs anxiety  4. Continue with Zanaflex for spasms and back pain.  5. Resume effexor IR 100mg  TID .  6. Hold wellbutrin for now 7. Extensive counseling was provided once again. Discussed better skills for organization, memory, etc.    8. All questions were encouraged and answered. i will see her back in 2 months. 30 minutes were spent with the patient today.

## 2014-08-26 NOTE — Patient Instructions (Signed)
PLEASE CALL ME WITH ANY PROBLEMS OR QUESTIONS (#[phone]).      

## 2014-08-27 ENCOUNTER — Other Ambulatory Visit (HOSPITAL_COMMUNITY): Payer: Self-pay | Admitting: Physical Medicine & Rehabilitation

## 2014-08-27 ENCOUNTER — Telehealth: Payer: Self-pay | Admitting: Physical Medicine & Rehabilitation

## 2014-08-27 DIAGNOSIS — R1310 Dysphagia, unspecified: Secondary | ICD-10-CM

## 2014-08-27 LAB — PMP ALCOHOL METABOLITE (ETG): Ethyl Glucuronide (EtG): NEGATIVE ng/mL

## 2014-08-27 NOTE — Telephone Encounter (Signed)
Patient scheduled for Barium Swallow test on 09/10/14 at [facility] Radiology Dept at 12:45 arrival time.

## 2014-08-28 LAB — PRESCRIPTION MONITORING PROFILE (SOLSTAS)
Amphetamine/Meth: NEGATIVE ng/mL
BUPRENORPHINE, URINE: NEGATIVE ng/mL
Barbiturate Screen, Urine: NEGATIVE ng/mL
Benzodiazepine Screen, Urine: NEGATIVE ng/mL
COCAINE METABOLITES: NEGATIVE ng/mL
Cannabinoid Scrn, Ur: NEGATIVE ng/mL
Carisoprodol, Urine: NEGATIVE ng/mL
Creatinine, Urine: 28.14 mg/dL (ref >20.0)
ECSTASY: NEGATIVE ng/mL
FENTANYL URINE: NEGATIVE ng/mL
METHADONE SCREEN, URINE: NEGATIVE ng/mL
Meperidine, Ur: NEGATIVE ng/mL
Nitrites, Initial: NEGATIVE ug/mL
OXYCODONE SCRN UR: NEGATIVE ng/mL
Opiate Screen, Urine: NEGATIVE ng/mL
PH URINE, INITIAL: 5.7 pH (ref 4.5–8.9)
Propoxyphene: NEGATIVE ng/mL
Tapentadol, urine: NEGATIVE ng/mL
Tramadol Scrn, Ur: NEGATIVE ng/mL
Zolpidem, Urine: NEGATIVE ng/mL

## 2014-09-10 ENCOUNTER — Ambulatory Visit (HOSPITAL_COMMUNITY)
Admission: RE | Admit: 2014-09-10 | Discharge: 2014-09-10 | Disposition: A | Discharge status: Home / Self Care | Payer: Medicare HMO | Source: Ambulatory Visit | Attending: Physical Medicine & Rehabilitation | Admitting: Physical Medicine & Rehabilitation

## 2014-09-10 DIAGNOSIS — R1314 Dysphagia, pharyngoesophageal phase: Secondary | ICD-10-CM

## 2014-09-10 DIAGNOSIS — K224 Dyskinesia of esophagus: Secondary | ICD-10-CM

## 2014-09-10 DIAGNOSIS — F418 Other specified anxiety disorders: Secondary | ICD-10-CM

## 2014-09-10 DIAGNOSIS — K219 Gastro-esophageal reflux disease without esophagitis: Secondary | ICD-10-CM

## 2014-09-10 DIAGNOSIS — R1310 Dysphagia, unspecified: Secondary | ICD-10-CM | POA: Diagnosis not present

## 2014-09-10 NOTE — Procedures (Signed)
Objective Swallowing Evaluation: Modified Barium Swallowing Study  Patient Details  Name: [name] [name] MRN: [id number] Date of Birth: 1946-07-09  Today's Date: 09/10/2014 Time: SLP Start Time (ACUTE ONLY): 1300-SLP Stop Time (ACUTE ONLY): 1325 SLP Time Calculation (min) (ACUTE ONLY): 25 min  Past Medical History:  Past Medical History  Diagnosis Date  . DM type 2 (diabetes mellitus, type 2)   . TIA (transient ischemic attack) 2010  . Anxiety   . GERD (gastroesophageal reflux disease)   . HTN (hypertension)   . Hypothyroidism   . Depression   . Allergic rhinitis   . Fibromyalgia   . Hemorrhoids   . Migraine headache   . Chest pain     Catheterization 2004 normal coronary arteries  /  nuclear August 2 007, no ischemia  . Joint pain     Pains in multiple joints  . Shortness of breath     March, 2012  . Elevated alkaline phosphatase level   . Dysphagia   . Hiatal hernia   . Abdominal pain, RUQ (right upper quadrant)   . Hypercholesteremia   . Hematochezia   . UTI (lower urinary tract infection)   . Cervical radiculopathy   . Rotator cuff syndrome of right shoulder   . Chronic neck pain   . Chronic back pain     R > L  . Chronic shoulder pain, right   . Lumbar radicular pain     R>L legs  . History of ankle fracture 01/08/2012    Status post open reduction internal fixation of a left ankle trimalleolar fracture by Dr. Iona Hansen on 01/10/2012.     Past Surgical History:  Past Surgical History  Procedure Laterality Date  . Cholecystectomy    . Abdominal hysterectomy  1984  . Breast surgery    . Cardiac catheterization    . Thyroidectomy    . Neuromas      removed from feet  . Wrist surgery      left  . Orif ankle fracture  01/10/2012    Procedure: OPEN REDUCTION INTERNAL FIXATION (ORIF) ANKLE FRACTURE;  Surgeon: Sanjuana Kava, MD;  Location: [facility];  Service: Orthopedics;  Laterality: Left;   HPI:  HPI: 69 yo female referred for MBS by Dr Tessa Lerner due to pt  complaint of dysphagia.  Pt reports dysphagia for "years" but states it has worsened in the last few months.  She states at times she will cough so bad that it makes her vomit.  Globus sensation reported.  Choking on intake occurs twice a day- both with solids and liquids - occuring usually on second swallow.  Chronic dry mouth reported by pt for which she uses biotene.  She denies requiring heimlich manuever, recurrent pnas nor weight loss.  PMH + for GERD, fibromyalgia, DM2, C7 radiculopathy, anxiety/depression.  PSH + for thyroidectomy in 1984 and she denies chronic dysphagia after surgery.  Pt reports taking Klonopin since 1984. Brain MRI 11/2011 negative.  Esophagram 2008 showed small hiatal hernia and reflux, difficulty initiating swallow, normal motility.  Pt reports she has been on Klonopin since 1984 - she reports she has a "tic".    No Data Recorded  Assessment / Plan / Recommendation CHL IP CLINICAL IMPRESSIONS 09/10/2014  Dysphagia Diagnosis Minimal oral deficits  Clinical impression Pt presents with functional oropharyngeal swallow without aspiration or deep laryngeal penetration of any consistency tested.  Swallow was strong without residuals noted.  Pt did report sensation of pudding lodging in pharynx  but upon esophageal sweep - appeared distally - liquid swallows faciliated clearance.  Prominent CP noted x1 with swallow of liquids without residuals.   Pt reports sensation of something in her throat "all the time" = globus = even without intake.    Unfortunately pt symptoms were not elicited during testing.  Pt also reports sensation of spasms in throat when lying down at night.  Using live video, provided pt with compensation strategies that may mitigate her dysphagia symptoms including using warm items.    Further recommend pt apply extra gravy/sauce to improve cohesiveness due to her oral/facial "tic" possibly impacting oral coordination.         CHL IP TREATMENT RECOMMENDATION 09/10/2014   Treatment Plan Recommendations No treatment recommended at this time     CHL IP DIET RECOMMENDATION 09/10/2014  Diet Recommendations Regular;Thin liquid, warm liquids preferred  Liquid Administration via Cup;Straw  Medication Administration Whole meds with liquid  Compensations Slow rate;Small sips/bites  Postural Changes and/or Swallow Maneuvers Upright 30-60 min after meal;Out of bed for meals;Seated upright 90 degrees;Start meals with liquids, Consume liquids t/o meal     CHL IP OTHER RECOMMENDATIONS 09/10/2014  Recommended Consults (None)  Oral Care Recommendations Oral care BID  Other Recommendations (None)     CHL IP FOLLOW UP RECOMMENDATIONS 09/10/2014  Follow up Recommendations None         CHL IP REASON FOR REFERRAL 09/10/2014  Reason for Referral Objectively evaluate swallowing function     CHL IP ORAL PHASE 09/10/2014  Oral Phase Impaired  Oral - Nectar Cup WFL  Oral - Thin Cup WFL  Oral - Thin Straw WFL  Oral - Puree WFL  Oral - Regular WFL  Oral - Pill WFL      CHL IP PHARYNGEAL PHASE 09/10/2014  Pharyngeal - Nectar Cup WFL  Pharyngeal - Thin Cup WFL;Penetration/Aspiration during swallow  Penetration/Aspiration details (thin cup) Material enters airway, remains ABOVE vocal cords and not ejected out  Pharyngeal - Thin Straw WFL  Pharyngeal - Puree WFL  Pharyngeal - Regular WFL  Pharyngeal - Pill WFL     CHL IP CERVICAL ESOPHAGEAL PHASE 09/10/2014  Cervical Esophageal Phase Impaired  Nectar Cup [facility]  Thin Cup Reduced cricopharyngeal relaxation  Thin Straw WFL  Thin Syringe (None)  Cervical Esophageal Comment barium tablet given with thin appeared to clear adequately, pudding barium appeared to lodge distally short term with pt having referrant sensation to pharynx - liquids faciliated clearance     CHL IP GO 09/10/2014  Functional Assessment Tool Used MBS, clinical judgement  Functional Limitations Swallowing  Swallow Current Status (M0375) CI  Swallow Goal Status  (O3606) CI  Swallow Discharge Status (V7034) [place], [place] SLP [phone]

## 2014-09-12 NOTE — Progress Notes (Signed)
Urine drug screen for this encounter is inconsistent for prescribed medication.  She reported taking med 08/25/14 but there is no evidence of any metabolite in her system (test 08/26/14)

## 2014-09-18 ENCOUNTER — Encounter: Payer: Self-pay | Admitting: Internal Medicine

## 2014-09-18 ENCOUNTER — Ambulatory Visit (INDEPENDENT_AMBULATORY_CARE_PROVIDER_SITE_OTHER): Payer: Medicare HMO | Admitting: Internal Medicine

## 2014-09-18 VITALS — BP 141/76 | HR 87 | Temp 98.2°F | Wt 205.3 lb

## 2014-09-18 DIAGNOSIS — Z1159 Encounter for screening for other viral diseases: Secondary | ICD-10-CM

## 2014-09-18 DIAGNOSIS — I10 Essential (primary) hypertension: Secondary | ICD-10-CM

## 2014-09-18 DIAGNOSIS — Z23 Encounter for immunization: Secondary | ICD-10-CM

## 2014-09-18 DIAGNOSIS — N3020 Other chronic cystitis without hematuria: Secondary | ICD-10-CM | POA: Insufficient documentation

## 2014-09-18 DIAGNOSIS — K219 Gastro-esophageal reflux disease without esophagitis: Secondary | ICD-10-CM

## 2014-09-18 DIAGNOSIS — E119 Type 2 diabetes mellitus without complications: Secondary | ICD-10-CM

## 2014-09-18 DIAGNOSIS — M797 Fibromyalgia: Secondary | ICD-10-CM

## 2014-09-18 DIAGNOSIS — R350 Frequency of micturition: Secondary | ICD-10-CM

## 2014-09-18 DIAGNOSIS — E7800 Pure hypercholesterolemia, unspecified: Secondary | ICD-10-CM

## 2014-09-18 DIAGNOSIS — E039 Hypothyroidism, unspecified: Secondary | ICD-10-CM

## 2014-09-18 LAB — COMPLETE METABOLIC PANEL WITH GFR
ALBUMIN: 4.8 g/dL (ref 3.5–5.2)
ALK PHOS: 106 U/L (ref 39–117)
ALT: 21 U/L (ref 0–35)
AST: 17 U/L (ref 0–37)
BILIRUBIN TOTAL: 0.3 mg/dL (ref 0.2–1.2)
BUN: 12 mg/dL (ref 6–23)
CALCIUM: 9.4 mg/dL (ref 8.4–10.5)
CO2: 23 mEq/L (ref 19–32)
CREATININE: 0.67 mg/dL (ref 0.50–1.10)
Chloride: 104 mEq/L (ref 96–112)
GFR, Est African American: >89 mL/min
GFR, Est Non African American: >89 mL/min
Glucose, Bld: 104 mg/dL — ABNORMAL HIGH (ref 70–99)
Potassium: 4.6 mEq/L (ref 3.5–5.3)
SODIUM: 141 meq/L (ref 135–145)
Total Protein: 7 g/dL (ref 6.0–8.3)

## 2014-09-18 LAB — CBC WITH DIFFERENTIAL/PLATELET
BASOS ABS: 0.1 10*3/uL (ref 0.0–0.1)
BASOS PCT: 1 % (ref 0–1)
Eosinophils Absolute: 0 10*3/uL (ref 0.0–0.7)
Eosinophils Relative: 0 % (ref 0–5)
HCT: 38.2 % (ref 36.0–46.0)
HEMOGLOBIN: 12.6 g/dL (ref 12.0–15.0)
LYMPHS PCT: 21 % (ref 12–46)
Lymphs Abs: 1.3 10*3/uL (ref 0.7–4.0)
MCH: 29.9 pg (ref 26.0–34.0)
MCHC: 33 g/dL (ref 30.0–36.0)
MCV: 90.5 fL (ref 78.0–100.0)
MONO ABS: 0.4 10*3/uL (ref 0.1–1.0)
MPV: 9.1 fL (ref 8.6–12.4)
Monocytes Relative: 7 % (ref 3–12)
Neutro Abs: 4.5 10*3/uL (ref 1.7–7.7)
Neutrophils Relative %: 71 % (ref 43–77)
Platelets: 314 10*3/uL (ref 150–400)
RBC: 4.22 MIL/uL (ref 3.87–5.11)
RDW: 13.4 % (ref 11.5–15.5)
WBC: 6.4 10*3/uL (ref 4.0–10.5)

## 2014-09-18 LAB — LIPID PANEL
CHOL/HDL RATIO: 2.7 ratio
Cholesterol: 170 mg/dL (ref 0–200)
HDL: 63 mg/dL (ref >=46)
LDL CALC: 81 mg/dL (ref 0–99)
Triglycerides: 128 mg/dL (ref <150)
VLDL: 26 mg/dL (ref 0–40)

## 2014-09-18 LAB — HM DIABETES EYE EXAM

## 2014-09-18 LAB — POCT GLYCOSYLATED HEMOGLOBIN (HGB A1C): Hemoglobin A1C: 5.8

## 2014-09-18 LAB — TSH: TSH: 0.177 u[IU]/mL — ABNORMAL LOW (ref 0.350–4.500)

## 2014-09-18 LAB — GLUCOSE, CAPILLARY: Glucose-Capillary: 112 mg/dL — ABNORMAL HIGH (ref 70–99)

## 2014-09-18 LAB — T4, FREE: FREE T4: 1.21 ng/dL (ref 0.80–1.80)

## 2014-09-18 NOTE — Assessment & Plan Note (Signed)
Assessment: Symptoms are well controlled on omeprazole 40 mg twice a day and hyoscyamine 0.125 mg 1-2 tablets sublingual every 4 hours as needed for chest pain (she takes this occasionally but not frequently).  She has not tolerated attempts to decrease her omeprazole dose to 40 mg daily.    Plan: Continue current medications.

## 2014-09-18 NOTE — Assessment & Plan Note (Addendum)
Lab Results  Component Value Date   HGBA1C 5.8 09/18/2014   HGBA1C 5.8 07/24/2014   HGBA1C 5.6 01/27/2014     Assessment: Diabetes control: good control (HgbA1C at goal) Progress toward A1C goal:  at goal Comments: Diabetes is well controlled on dietary and lifestyle management  Plan: Medications:  Continue dietary and lifestyle management. Home glucose monitoring: Frequency: no home glucose monitoring Timing: N/A Instruction/counseling given: reminded to get eye exam Educational resources provided: brochure Self management tools provided:  (does not test)  Other plans: Check a comprehensive metabolic panel and CBC with differential.

## 2014-09-18 NOTE — Assessment & Plan Note (Signed)
Lab Results  Component Value Date   TSH 0.351 03/26/2014     Assessment: Patient has no symptoms of thyroid dysfunction on levothyroxine 112 mcg daily.  Plan: Check TSH and free T4 today; continue levothyroxine 112 mcg daily pending those results.

## 2014-09-18 NOTE — Assessment & Plan Note (Addendum)
Assessment: Patient's fibromyalgia/chronic pain is managed by Dr. Naaman Plummer in the pain clinic, and she reports that she is doing well.  Plan: Continue management as per Dr. Naaman Plummer.

## 2014-09-18 NOTE — Progress Notes (Signed)
   Subjective:    Patient ID: [name] [name], female    DOB: 09-12-1945, 69 y.o.   MRN: [id number]  HPI Patient returns for management of her diet-controlled diabetes mellitus, GERD, hyperlipidemia, and other chronic medical problems.  Today she reports recent urinary frequency with occasional blood-tinged urine.  She has not followed up recently with her urologist.  She reports good control of her chronic pain syndrome under the management of Dr. Naaman Plummer.  She reports that she is compliant with her medications.   Review of Systems  Genitourinary: Positive for frequency.       Objective:   Physical Exam  Constitutional: No distress.  Cardiovascular: Normal rate, regular rhythm and normal heart sounds.  Exam reveals no gallop and no friction rub.   No murmur heard. No lower extremity edema  Pulmonary/Chest: Effort normal and breath sounds normal. No respiratory distress. She has no wheezes. She has no rales.  Abdominal: Soft. Bowel sounds are normal. She exhibits no distension. There is no tenderness. There is no rebound and no guarding.        Assessment & Plan:

## 2014-09-18 NOTE — Assessment & Plan Note (Signed)
Assessment: Patient reports recent urinary frequency with some blood-tinged urine; she has history of urinary frequency and incontinence, and is followed by urologist Dr. Matilde Sprang.  His notes indicate that he feels she likely has chronic cystitis, and she is on chronic suppressive therapy with trimethoprim.  She has not followed up with Dr.MacDiarmid recently.  Plan: Check a urinalysis and urine culture today; I advised patient to followup with Dr. Dr. Matilde Sprang.

## 2014-09-18 NOTE — Patient Instructions (Addendum)
Continue current medications. Please schedule a follow-up appointment with your urologist. Please schedule an appointment with your ophthalmologist for annual eye exam.

## 2014-09-18 NOTE — Assessment & Plan Note (Signed)
Lipids:    Component Value Date/Time   CHOL 167 11/06/2013 0957   TRIG 183* 11/06/2013 0957   HDL 67 11/06/2013 0957   LDLCALC 63 11/06/2013 0957   VLDL 37 11/06/2013 0957   CHOLHDL 2.5 11/06/2013 0957   Assessment: Patient is doing well on pravastatin 20 mg daily.  Plan: Check a lipid panel today; continue pravastatin 20 mg daily pending the result.

## 2014-09-18 NOTE — Assessment & Plan Note (Signed)
BP Readings from Last 3 Encounters:  09/18/14 141/76  07/28/14 156/86  07/24/14 144/73    Lab Results  Component Value Date   NA 139 03/26/2014   K 4.5 03/26/2014   CREATININE 0.67 03/26/2014    Assessment: Blood pressure control: controlled Progress toward BP goal:  at goal Comments: Blood pressure is at goal on losartan 50 mg daily.  Plan: Medications:  Continue losartan 50 mg daily. Educational resources provided: brochure

## 2014-09-19 ENCOUNTER — Other Ambulatory Visit: Payer: Self-pay | Admitting: Internal Medicine

## 2014-09-19 ENCOUNTER — Telehealth: Payer: Self-pay | Admitting: Internal Medicine

## 2014-09-19 DIAGNOSIS — E039 Hypothyroidism, unspecified: Secondary | ICD-10-CM

## 2014-09-19 LAB — URINALYSIS, ROUTINE W REFLEX MICROSCOPIC
Bilirubin Urine: NEGATIVE
GLUCOSE, UA: NEGATIVE mg/dL
Hgb urine dipstick: NEGATIVE
KETONES UR: NEGATIVE mg/dL
NITRITE: NEGATIVE
PH: 6 (ref 5.0–8.0)
Protein, ur: NEGATIVE mg/dL
SPECIFIC GRAVITY, URINE: 1.013 (ref 1.005–1.030)
Urobilinogen, UA: 0.2 mg/dL (ref 0.0–1.0)

## 2014-09-19 LAB — HEPATITIS C ANTIBODY: HCV Ab: NEGATIVE

## 2014-09-19 LAB — URINALYSIS, MICROSCOPIC ONLY
Bacteria, UA: NONE SEEN
CRYSTALS: NONE SEEN
Casts: NONE SEEN
Squamous Epithelial / LPF: NONE SEEN

## 2014-09-19 LAB — URINE CULTURE
Colony Count: NO GROWTH
ORGANISM ID, BACTERIA: NO GROWTH

## 2014-09-19 MED ORDER — LEVOTHYROXINE SODIUM 100 MCG PO TABS: 100.0000 ug | ORAL_TABLET | Freq: Every day | ORAL | Status: DC

## 2014-09-19 NOTE — Assessment & Plan Note (Signed)
Documentation Note  Lab Results  Component Value Date   TSH 0.177* 09/18/2014   FREET4 1.21 09/18/2014    TSH is somewhat low on a levothyroxine dose of 112 g daily. The plan is to decrease the dose to 100 g daily, and repeat TSH and free T4 in 6 weeks.

## 2014-09-19 NOTE — Progress Notes (Signed)
Quick Note:  TSH is somewhat low on a levothyroxine dose of 112 g daily. The plan is to decrease the dose to 100 g daily, and repeat TSH and free T4 in 6 weeks. ______

## 2014-09-19 NOTE — Telephone Encounter (Signed)
   Reason for call:   I received a call from [name] at 4.43pm stating that she had just been called about some results. Checked office visit notes yesterday. Pt was seen by PCP, recs were for patients dose of synthroid to be reduced to 147mcg daily. Patient correctly says she is presently taking 135mcg daily. She will prefer a new prescription of 149mcg called in, as she does not know how to adjust present does to new dose.   Pertinent Data:   TSH- 09/17/2013- 0.177. Called pharmacy, patients new prescription has already been sent in.    Assessment / Plan / Recommendations:   Called in Synthroid- 147mcg daily. Instruced patient not to take 163mcg daily.  Pharmacy to call patient about prescription being ready.  As always, pt is advised that if symptoms worsen or new symptoms arise, they should go to an urgent care facility or to to ER for further evaluation.   [name] Roys, MD   09/19/2014, 4:43 PM

## 2014-09-22 ENCOUNTER — Encounter: Payer: Self-pay | Admitting: *Deleted

## 2014-09-22 ENCOUNTER — Other Ambulatory Visit: Payer: Self-pay | Admitting: Physical Medicine & Rehabilitation

## 2014-09-22 NOTE — Progress Notes (Signed)
Pt informed.[name] Kitchenkg

## 2014-09-26 ENCOUNTER — Telehealth: Payer: Self-pay | Admitting: Internal Medicine

## 2014-09-26 ENCOUNTER — Other Ambulatory Visit: Payer: Self-pay | Admitting: Internal Medicine

## 2014-09-26 ENCOUNTER — Encounter: Payer: Self-pay | Admitting: *Deleted

## 2014-09-26 DIAGNOSIS — E039 Hypothyroidism, unspecified: Secondary | ICD-10-CM

## 2014-09-26 NOTE — Telephone Encounter (Signed)
   Reason for call:   I received a call from Ms. Peggyann Juba at 6:15pm  indicating that she is concerned about possible blood in urine.  She notes she saw two dark spots on her pad today and she felt that one was stool and the other she felt may be very dark blood from her urine although she is unsure.   Pertinent Data:   I reviewed Dr. Marinda Elk note from 09/18/14 where she reported some blood in urine, he checked a U/A w/ micro that showed no microscopic hematuria.  She denies any dysuria or other symptoms  She has an appointment with urology on 10/27/14   Assessment / Plan / Recommendations:   I am not exactly sure what to make of her symptoms.  I have asked that if she develop dysuria or gross hematuria to please go to the ED or urgent care for evaluation.  Otherwise I feel it would be reasonable for her to call in for an appointment on Monday to be seen this upcoming week in clinic.  She agrees with this plan.   Lucious Groves, DO   09/26/2014, 6:17 PM

## 2014-09-30 ENCOUNTER — Ambulatory Visit: Payer: Medicare HMO | Admitting: Internal Medicine

## 2014-10-07 ENCOUNTER — Ambulatory Visit: Payer: Medicare HMO | Admitting: Physical Medicine & Rehabilitation

## 2014-10-08 ENCOUNTER — Other Ambulatory Visit: Payer: Self-pay | Admitting: Internal Medicine

## 2014-10-08 NOTE — Telephone Encounter (Signed)
I changed levothyroxine dose to 100 micrograms once daily on 09/19/14 and sent new prescription to pharmacy - please confirm that they received it.

## 2014-10-09 ENCOUNTER — Other Ambulatory Visit: Payer: Self-pay | Admitting: Registered Nurse

## 2014-10-10 ENCOUNTER — Other Ambulatory Visit: Payer: Self-pay | Admitting: Internal Medicine

## 2014-10-10 NOTE — Telephone Encounter (Signed)
Pt not sure why pharmacy was requesting the incorrect dose, but pt confirmed she was taking 100 micrograms daily. Pt recently transfered medications from another pharmacy which may contributed to the mixup, but pharmacy was contacted to make sure they had the correct dose.Despina Hidden Cassady4/1/20163:09 PM

## 2014-10-14 ENCOUNTER — Encounter: Payer: Medicare HMO | Admitting: Registered Nurse

## 2014-10-20 ENCOUNTER — Other Ambulatory Visit: Payer: Self-pay | Admitting: Physical Medicine & Rehabilitation

## 2014-10-24 ENCOUNTER — Other Ambulatory Visit (INDEPENDENT_AMBULATORY_CARE_PROVIDER_SITE_OTHER): Payer: Medicare HMO

## 2014-10-24 DIAGNOSIS — E039 Hypothyroidism, unspecified: Secondary | ICD-10-CM | POA: Diagnosis not present

## 2014-10-24 LAB — TSH: TSH: 1.52 u[IU]/mL (ref 0.350–4.500)

## 2014-10-24 LAB — T4, FREE: FREE T4: 0.92 ng/dL (ref 0.80–1.80)

## 2014-10-27 ENCOUNTER — Telehealth: Payer: Self-pay | Admitting: Internal Medicine

## 2014-10-27 DIAGNOSIS — E039 Hypothyroidism, unspecified: Secondary | ICD-10-CM

## 2014-10-27 NOTE — Telephone Encounter (Signed)
Telephone Contact Note  TSH and free T4 are within normal limits on levothyroxine 100 micrograms daily.  I called patient and discussed the results with her today.  I advised her to continue her current dose of levothyroxine 100 micrograms daily, and to follow-up in the clinic in June at which time she will need to have the TSH and free T4 rechecked.  I advised her that if she had not received an appointment by June, to call and make an appointment with a clinic physician.

## 2014-10-27 NOTE — Progress Notes (Signed)
Quick Note:  TSH and free T4 are within normal limits on levothyroxine 100 g daily. I called patient and discussed the results with her today. I advised her to continue her current dose of levothyroxine 100 g daily, and to follow-up in the clinic in June at which time she will need to have the TSH and free T4 rechecked. I advised her that if she had not received an appointment by June, to call and make an appointment with a clinic physician. ______

## 2014-10-27 NOTE — Assessment & Plan Note (Signed)
Telephone Contact Note  TSH and free T4 are within normal limits on levothyroxine 100 micrograms daily.  I called patient and discussed the results with her today.  I advised her to continue her current dose of levothyroxine 100 micrograms daily, and to follow-up in the clinic in June at which time she will need to have the TSH and free T4 rechecked.  I advised her that if she had not received an appointment by June, to call and make an appointment with a clinic physician.

## 2014-10-28 ENCOUNTER — Encounter: Payer: Medicare HMO | Attending: Physical Medicine and Rehabilitation | Admitting: Physical Medicine & Rehabilitation

## 2014-10-28 ENCOUNTER — Ambulatory Visit: Payer: Medicare HMO | Admitting: Registered Nurse

## 2014-10-28 ENCOUNTER — Encounter: Payer: Self-pay | Admitting: Physical Medicine & Rehabilitation

## 2014-10-28 VITALS — BP 140/78 | HR 80 | Resp 14

## 2014-10-28 DIAGNOSIS — M47816 Spondylosis without myelopathy or radiculopathy, lumbar region: Secondary | ICD-10-CM | POA: Diagnosis not present

## 2014-10-28 DIAGNOSIS — M797 Fibromyalgia: Secondary | ICD-10-CM

## 2014-10-28 DIAGNOSIS — M5412 Radiculopathy, cervical region: Secondary | ICD-10-CM

## 2014-10-28 DIAGNOSIS — K224 Dyskinesia of esophagus: Secondary | ICD-10-CM

## 2014-10-28 DIAGNOSIS — M75101 Unspecified rotator cuff tear or rupture of right shoulder, not specified as traumatic: Secondary | ICD-10-CM

## 2014-10-28 DIAGNOSIS — Z79899 Other long term (current) drug therapy: Secondary | ICD-10-CM | POA: Insufficient documentation

## 2014-10-28 DIAGNOSIS — G43019 Migraine without aura, intractable, without status migrainosus: Secondary | ICD-10-CM | POA: Diagnosis not present

## 2014-10-28 DIAGNOSIS — F418 Other specified anxiety disorders: Secondary | ICD-10-CM

## 2014-10-28 DIAGNOSIS — Z5181 Encounter for therapeutic drug level monitoring: Secondary | ICD-10-CM | POA: Insufficient documentation

## 2014-10-28 DIAGNOSIS — M47817 Spondylosis without myelopathy or radiculopathy, lumbosacral region: Secondary | ICD-10-CM

## 2014-10-28 DIAGNOSIS — M19011 Primary osteoarthritis, right shoulder: Secondary | ICD-10-CM | POA: Diagnosis present

## 2014-10-28 MED ORDER — VENLAFAXINE HCL 100 MG PO TABS: 100.0000 mg | ORAL_TABLET | Freq: Two times a day (BID) | ORAL | Status: DC

## 2014-10-28 NOTE — Progress Notes (Signed)
Subjective:    Patient ID: [name] [name], female    DOB: [birth year], 69 y.o.   MRN: [id number]  HPI  [name] [name] is here in follow up of her chronic pain. She went for an MBS on 09/10/14. No anatomic or physiologic abnormalities were found. SLP did recommend small bites, plenty of fluids/gravy with solids.  These steps have made a huge difference---she has had only one or two gaggin episodes since the study.  She has had more pain in her feet on a couple occasions when she and her husband have been out shopping. They were on their feet quite a bit, and sx increased. She is wondering if it has to do with decreasing her effexor.   Her right shoulder has been bothering her still. She is doing some simple stretches but without a lot of results. She sleeps on her right side a lot but it tends to exacerbate her pain.    Pain Inventory Average Pain 8 Pain Right Now 5 My pain is constant, sharp and aching  In the last 24 hours, has pain interfered with the following? General activity 4 Relation with others 4 Enjoyment of life 7 What TIME of day is your pain at its worst? night Sleep (in general) Fair  Pain is worse with: bending, standing and some activites Pain improves with: rest, heat/ice, medication and injections Relief from Meds: 6  Mobility walk without assistance how many minutes can you walk? 20 ability to climb steps?  yes do you drive?  yes  Function retired  Neuro/Psych bladder control problems bowel control problems weakness numbness dizziness depression anxiety  Prior Studies Any changes since last visit?  no  Physicians involved in your care Any changes since last visit?  no   Family History  Problem Relation Age of Onset  . Cancer      breast -- grandmother  . Cancer      lung -- uncles (3)  . Leukemia Sister   . Diabetes Father   . Diabetes Mother   . Coronary artery disease Father    History   Social History  . Marital Status: Married   Spouse Name: N/A  . Number of Children: N/A  . Years of Education: N/A   Social History Main Topics  . Smoking status: Never Smoker   . Smokeless tobacco: Never Used  . Alcohol Use: No  . Drug Use: No  . Sexual Activity: Not on file   Other Topics Concern  . None   Social History Narrative   Past Surgical History  Procedure Laterality Date  . Cholecystectomy    . Abdominal hysterectomy  1984  . Breast surgery    . Cardiac catheterization    . Thyroidectomy    . Neuromas      removed from feet  . Wrist surgery      left  . Orif ankle fracture  01/10/2012    Procedure: OPEN REDUCTION INTERNAL FIXATION (ORIF) ANKLE FRACTURE;  Surgeon: Sanjuana Kava, MD;  Location: [facility];  Service: Orthopedics;  Laterality: Left;   Past Medical History  Diagnosis Date  . DM type 2 (diabetes mellitus, type 2)   . TIA (transient ischemic attack) 2010  . Anxiety   . GERD (gastroesophageal reflux disease)   . HTN (hypertension)   . Hypothyroidism   . Depression   . Allergic rhinitis   . Fibromyalgia   . Hemorrhoids   . Migraine headache   . Chest pain  Catheterization 2004 normal coronary arteries  /  nuclear August 2 007, no ischemia  . Joint pain     Pains in multiple joints  . Shortness of breath     March, 2012  . Elevated alkaline phosphatase level   . Dysphagia   . Hiatal hernia   . Abdominal pain, RUQ (right upper quadrant)   . Hypercholesteremia   . Hematochezia   . UTI (lower urinary tract infection)   . Cervical radiculopathy   . Rotator cuff syndrome of right shoulder   . Chronic neck pain   . Chronic back pain     R > L  . Chronic shoulder pain, right   . Lumbar radicular pain     R>L legs  . History of ankle fracture 01/08/2012    Status post open reduction internal fixation of a left ankle trimalleolar fracture by Dr. Iona Hansen on 01/10/2012.     BP 140/78 mmHg  Pulse 80  Resp 14  SpO2 96%  Opioid Risk Score:   Fall Risk Score: Low Fall Risk (0-5  points)`1  Depression screen PHQ 2/9  Depression screen [facility] 2/9 10/28/2014 09/18/2014 07/24/2014 03/26/2014 02/21/2014 01/27/2014 11/06/2013  Decreased Interest 0 0 0 0 0 0 0  Down, Depressed, Hopeless 1 0 0 0 0 0 1  PHQ - 2 Score 1 0 0 0 0 0 1  Altered sleeping 3 - - - - - -  Tired, decreased energy 3 - - - - - -  Change in appetite 2 - - - - - -  Feeling bad or failure about yourself  0 - - - - - -  Trouble concentrating 0 - - - - - -  Moving slowly or fidgety/restless 0 - - - - - -  Suicidal thoughts 0 - - - - - -  PHQ-9 Score 9 - - - - - -     Review of Systems  Constitutional: Positive for fever, chills, fatigue and unexpected weight change.       Night sweats, weight gain  Eyes: Negative.   Respiratory: Positive for cough, shortness of breath and wheezing.        Respiratory infections  Cardiovascular:       Limb swelling  Gastrointestinal: Positive for nausea, abdominal pain and constipation.       Bowel control problems  Endocrine: Negative.   Genitourinary:       Bladder control problems  Musculoskeletal: Positive for myalgias, back pain and arthralgias.  Skin: Negative.        Skin breakdown  Allergic/Immunologic: Negative.   Neurological: Positive for dizziness, weakness and numbness.  Hematological: Negative.   Psychiatric/Behavioral: Positive for dysphoric mood. The patient is nervous/anxious.        Objective:   Physical Exam   Constitutional: She is oriented to person, place, and time. She appears well-developed and well-nourished.  HENT: vocal quality appears normal. No coughing.  Head: Normocephalic and atraumatic.  Eyes: Conjunctivae and EOM are normal. Pupils are equal, round, and reactive to light.  Neck: Normal range of motion.  Cardiovascular: Normal rate and regular rhythm.  Pulmonary/Chest: Effort normal.  Abdominal: Soft. Bowel sounds are normal.  Musculoskeletal:  Patient has right rotator cuff impingement signs which were positive. Pain noted  at right subacromial space with palpation..  .  Cervical paraspinals, upper traps remain tight on right more than left. She had tenderness with palpation. Scapulae are tight. Head forward posture.  Neurological: She is alert  and oriented to person, place, and time. No weakness or obvious sensory changes are noted. Normal cough  Gait is less antalgic.  Assessment & Plan:   ASSESSMENT:  1. History of fibromyalgia with myofascial pain. Both scapulae are tight. 2. Chronic low back pain with lumbar disc and facet disease.  3. Right otator cuff syndrome./right bicipital tendonitis 4. Anxiety and depression.  5. Cervical stenosis with left C7 radiculopathy  6. Hx of fall with left trimalleolar ankle fx.  7. Left greater troch bursitis  8. Left and right wrist pain, extensor tendonitis  9. Functional dysphagia improved with small bites/ plenty of fluid with meals    PLAN:  1. hydrocodone 5/325 for breakthrough pain.  2. Provided patient scapular ROM exercises and information about scapular mobilization 3. Continue with neurontin for her general FMS pain. Continue klonopin for sleep/ hs anxiety  4. Continue with Zanaflex for spasms and back pain.  5. Maintain effexor IR at 100mg  BID for now. Increase to TID if needed if foot pain persists.  6. Hold wellbutrin for now  7. Still needs to work on better skills for organization, memory, etc.  8. All questions were encouraged and answered. i will see her back in 3 months. 30 minutes were spent with the patient today.

## 2014-10-28 NOTE — Patient Instructions (Signed)
PLEASE CALL ME WITH ANY PROBLEMS OR QUESTIONS (#[phone]).    SCAPULAR MOBILIZATION EXERCISES  REMEMBER YOUR POSTURE!!!!!

## 2014-11-17 ENCOUNTER — Other Ambulatory Visit: Payer: Self-pay | Admitting: Internal Medicine

## 2014-11-18 ENCOUNTER — Other Ambulatory Visit: Payer: Self-pay | Admitting: Physical Medicine & Rehabilitation

## 2014-11-18 ENCOUNTER — Telehealth: Payer: Self-pay | Admitting: *Deleted

## 2014-11-18 NOTE — Telephone Encounter (Signed)
Patient called wants pain medication - her back, shoulder and neck pain is very severe.  Can she come and pick up RX.  She is a new patient

## 2014-11-19 NOTE — Telephone Encounter (Signed)
Please call Mrs. Trowbridge and make her an appointment with Naaman Plummer or [name]. Thank you.

## 2014-12-08 ENCOUNTER — Other Ambulatory Visit: Payer: Self-pay | Admitting: Internal Medicine

## 2014-12-08 DIAGNOSIS — K219 Gastro-esophageal reflux disease without esophagitis: Secondary | ICD-10-CM

## 2014-12-09 ENCOUNTER — Ambulatory Visit: Payer: Medicare HMO | Admitting: Physical Medicine & Rehabilitation

## 2014-12-10 ENCOUNTER — Other Ambulatory Visit: Payer: Self-pay | Admitting: Physical Medicine & Rehabilitation

## 2014-12-12 ENCOUNTER — Other Ambulatory Visit: Payer: Self-pay | Admitting: Registered Nurse

## 2014-12-12 ENCOUNTER — Ambulatory Visit
Admission: RE | Admit: 2014-12-12 | Discharge: 2014-12-12 | Disposition: A | Discharge status: Home / Self Care | Payer: Medicare HMO | Source: Ambulatory Visit | Attending: Registered Nurse | Admitting: Registered Nurse

## 2014-12-12 ENCOUNTER — Encounter: Payer: Self-pay | Admitting: Registered Nurse

## 2014-12-12 ENCOUNTER — Encounter: Payer: Medicare HMO | Attending: Physical Medicine and Rehabilitation | Admitting: Registered Nurse

## 2014-12-12 VITALS — BP 144/90 | HR 92 | Resp 14

## 2014-12-12 DIAGNOSIS — Z79899 Other long term (current) drug therapy: Secondary | ICD-10-CM | POA: Diagnosis not present

## 2014-12-12 DIAGNOSIS — M25511 Pain in right shoulder: Secondary | ICD-10-CM

## 2014-12-12 DIAGNOSIS — Z5181 Encounter for therapeutic drug level monitoring: Secondary | ICD-10-CM | POA: Diagnosis not present

## 2014-12-12 DIAGNOSIS — M19011 Primary osteoarthritis, right shoulder: Secondary | ICD-10-CM | POA: Insufficient documentation

## 2014-12-12 DIAGNOSIS — M47817 Spondylosis without myelopathy or radiculopathy, lumbosacral region: Secondary | ICD-10-CM | POA: Diagnosis not present

## 2014-12-12 DIAGNOSIS — M75101 Unspecified rotator cuff tear or rupture of right shoulder, not specified as traumatic: Secondary | ICD-10-CM

## 2014-12-12 DIAGNOSIS — M797 Fibromyalgia: Secondary | ICD-10-CM

## 2014-12-12 DIAGNOSIS — G894 Chronic pain syndrome: Secondary | ICD-10-CM | POA: Diagnosis not present

## 2014-12-12 DIAGNOSIS — F418 Other specified anxiety disorders: Secondary | ICD-10-CM

## 2014-12-12 DIAGNOSIS — K224 Dyskinesia of esophagus: Secondary | ICD-10-CM

## 2014-12-12 MED ORDER — HYDROCODONE-ACETAMINOPHEN 5-325 MG PO TABS: 1.0000 | ORAL_TABLET | Freq: Three times a day (TID) | ORAL | Status: DC | PRN

## 2014-12-12 NOTE — Progress Notes (Signed)
Subjective:    Patient ID: [name] [name], female    DOB: 1946/01/13, 69 y.o.   MRN: [id number]  HPI: [name] [name] is a 69 year old female who returns for follow up for chronic pain and medication refill.She says her pain is located in her neck (mainly right side), right arm pain mid to lower back and left ankle. She rates her pain 6. Her current exercise regime is walking and performing stretching exercises.  Also states she was sitting on a rolling chair on 12/11/2014, the chair rolled from under her and she landed on her buttocks. She was able to pick herself up she didn't seek medical attention. She was instructed not to sit on rolling chairs and falls prevention reviewed. She verbalizes understanding. [name] [name] has been out of her medication's she states she didn't receive her hydrocodone last visit. Reviewed [facility] Controlled Substance reporting system last prescription was filled on 08/26/2014. Offered apology she verbalizes understanding.  Pain Inventory Average Pain 7 Pain Right Now 6 My pain is constant, sharp and aching  In the last 24 hours, has pain interfered with the following? General activity 4 Relation with others 4 Enjoyment of life 7 What TIME of day is your pain at its worst? daytime, night Sleep (in general) Poor  Pain is worse with: some activites Pain improves with: rest, heat/ice, therapy/exercise, medication and injections Relief from Meds: 5  Mobility walk with assistance use a cane how many minutes can you walk? 20 ability to climb steps?  yes do you drive?  yes Do you have any goals in this area?  yes  Function disabled: date disabled . I need assistance with the following:  dressing, bathing, meal prep and shopping  Neuro/Psych bladder control problems bowel control problems weakness numbness tingling trouble walking dizziness depression anxiety  Prior Studies Any changes since last visit?  no  Physicians involved in your  care Any changes since last visit?  no   Family History  Problem Relation Age of Onset  . Cancer      breast -- grandmother  . Cancer      lung -- uncles (3)  . Leukemia Sister   . Diabetes Father   . Diabetes Mother   . Coronary artery disease Father    History   Social History  . Marital Status: Married    Spouse Name: N/A  . Number of Children: N/A  . Years of Education: N/A   Social History Main Topics  . Smoking status: Never Smoker   . Smokeless tobacco: Never Used  . Alcohol Use: No  . Drug Use: No  . Sexual Activity: Not on file   Other Topics Concern  . None   Social History Narrative   Past Surgical History  Procedure Laterality Date  . Cholecystectomy    . Abdominal hysterectomy  1984  . Breast surgery    . Cardiac catheterization    . Thyroidectomy    . Neuromas      removed from feet  . Wrist surgery      left  . Orif ankle fracture  01/10/2012    Procedure: OPEN REDUCTION INTERNAL FIXATION (ORIF) ANKLE FRACTURE;  Surgeon: Sanjuana Kava, MD;  Location: [facility];  Service: Orthopedics;  Laterality: Left;   Past Medical History  Diagnosis Date  . DM type 2 (diabetes mellitus, type 2)   . TIA (transient ischemic attack) 2010  . Anxiety   . GERD (gastroesophageal reflux disease)   .  HTN (hypertension)   . Hypothyroidism   . Depression   . Allergic rhinitis   . Fibromyalgia   . Hemorrhoids   . Migraine headache   . Chest pain     Catheterization 2004 normal coronary arteries  /  nuclear August 2 007, no ischemia  . Joint pain     Pains in multiple joints  . Shortness of breath     March, 2012  . Elevated alkaline phosphatase level   . Dysphagia   . Hiatal hernia   . Abdominal pain, RUQ (right upper quadrant)   . Hypercholesteremia   . Hematochezia   . UTI (lower urinary tract infection)   . Cervical radiculopathy   . Rotator cuff syndrome of right shoulder   . Chronic neck pain   . Chronic back pain     R > L  . Chronic shoulder  pain, right   . Lumbar radicular pain     R>L legs  . History of ankle fracture 01/08/2012    Status post open reduction internal fixation of a left ankle trimalleolar fracture by Dr. Iona Hansen on 01/10/2012.     BP 144/90 mmHg  Pulse 92  Resp 14  SpO2 97%  Opioid Risk Score:   Fall Risk Score: Moderate Fall Risk (6-13 points)`1  Depression screen PHQ 2/9  Depression screen [facility] 2/9 10/28/2014 09/18/2014 07/24/2014 03/26/2014 02/21/2014 01/27/2014 11/06/2013  Decreased Interest 0 0 0 0 0 0 0  Down, Depressed, Hopeless 1 0 0 0 0 0 1  PHQ - 2 Score 1 0 0 0 0 0 1  Altered sleeping 3 - - - - - -  Tired, decreased energy 3 - - - - - -  Change in appetite 2 - - - - - -  Feeling bad or failure about yourself  0 - - - - - -  Trouble concentrating 0 - - - - - -  Moving slowly or fidgety/restless 0 - - - - - -  Suicidal thoughts 0 - - - - - -  PHQ-9 Score 9 - - - - - -     Review of Systems  Constitutional:       Fever/chills Night sweats Weight gain  HENT: Negative.   Eyes: Negative.   Respiratory: Positive for cough, shortness of breath and wheezing.        Respiratory infection  Cardiovascular: Positive for leg swelling.  Gastrointestinal: Positive for abdominal pain.  Endocrine: Negative.   Genitourinary: Negative.        Urinary retention  Musculoskeletal: Positive for gait problem.  Skin: Positive for rash.  Allergic/Immunologic: Negative.   Neurological: Positive for dizziness, weakness and numbness.       Tingling   Hematological: Negative.   Psychiatric/Behavioral: Positive for dysphoric mood. The patient is nervous/anxious.   All other systems reviewed and are negative.      Objective:   Physical Exam  Constitutional: She is oriented to person, place, and time. She appears well-developed and well-nourished.  HENT:  Head: Normocephalic and atraumatic.  Neck: Normal range of motion. Neck supple.  Cardiovascular: Normal rate and regular rhythm.     Pulmonary/Chest: Effort normal and breath sounds normal.  Musculoskeletal:  Normal Muscle Bulk and Muscle Testing Reveals: Upper Extremities: Right Decreased ROM 45 Degrees and Muscle Strength 4/5 Right Rhomboid Tenderness Left: Full ROM and Muscle strength 5/5 Back without spinal or paraspinal tenderness Lower Extremities: Full ROM and Muscle Strength 5/5 Left ankle brace intact  Arises from chair with ease Narrow Based gait     Neurological: She is alert and oriented to person, place, and time.  Skin: Skin is warm and dry.  Psychiatric: She has a normal mood and affect.  Nursing note and vitals reviewed.         Assessment & Plan:  1.History of fibromyalgia. Continue Current Medication Regime and Exercise Regime. Continue Gabapentin 2. Chronic low back pain. Myofascial pain.: Refilled: Hydrocodone 5 mg one tablet every 8 hours as needed for pain #60. Second script given to accommodate scheduled appointment. Continue Voltaren gel, Neurontin and Zanaflex  3. Rotator cuff syndrome./right bicipital tendonitis :  Continue with heat and exercise as tolerated. 4. Acute Right Shoulder Pain: RX X-ray 5. Anxiety and depression. Continue Effexor and Klonopin.  20 minutes of face to face patient care time was spent during this visit. All questions were encouraged and answered.   F/U in 1 month with Dr. Naaman Plummer

## 2014-12-13 LAB — PMP ALCOHOL METABOLITE (ETG): ETGU: NEGATIVE ng/mL

## 2014-12-16 ENCOUNTER — Other Ambulatory Visit: Payer: Self-pay | Admitting: Internal Medicine

## 2014-12-16 ENCOUNTER — Other Ambulatory Visit: Payer: Self-pay | Admitting: Physical Medicine & Rehabilitation

## 2014-12-16 LAB — PRESCRIPTION MONITORING PROFILE (SOLSTAS)
Amphetamine/Meth: NEGATIVE ng/mL
BARBITURATE SCREEN, URINE: NEGATIVE ng/mL
BUPRENORPHINE, URINE: NEGATIVE ng/mL
Benzodiazepine Screen, Urine: NEGATIVE ng/mL
CANNABINOID SCRN UR: NEGATIVE ng/mL
COCAINE METABOLITES: NEGATIVE ng/mL
Carisoprodol, Urine: NEGATIVE ng/mL
Creatinine, Urine: 34.13 mg/dL (ref >20.0)
ECSTASY: NEGATIVE ng/mL
FENTANYL URINE: NEGATIVE ng/mL
MEPERIDINE UR: NEGATIVE ng/mL
METHADONE SCREEN, URINE: NEGATIVE ng/mL
Nitrites, Initial: NEGATIVE ug/mL
OPIATE SCREEN, URINE: NEGATIVE ng/mL
Oxycodone Screen, Ur: NEGATIVE ng/mL
Propoxyphene: NEGATIVE ng/mL
Tapentadol, urine: NEGATIVE ng/mL
Tramadol Scrn, Ur: NEGATIVE ng/mL
Zolpidem, Urine: NEGATIVE ng/mL
pH, Initial: 5.7 pH (ref 4.5–8.9)

## 2014-12-17 ENCOUNTER — Telehealth: Payer: Self-pay | Admitting: Registered Nurse

## 2014-12-17 NOTE — Telephone Encounter (Signed)
Called to speak to [name] regarding her X-ray results. Her husband states she's sleeping she will return our call tomorrow.

## 2014-12-18 ENCOUNTER — Telehealth: Payer: Self-pay | Admitting: Registered Nurse

## 2014-12-24 NOTE — Progress Notes (Signed)
Urine drug screen for this encounter is consistent for prescribed medication being absent since had not had in 2 months.

## 2015-01-01 ENCOUNTER — Telehealth: Payer: Self-pay | Admitting: *Deleted

## 2015-01-01 NOTE — Telephone Encounter (Signed)
RETURNED CALL TO PATIENT, LVM, PATIENT CALLED ASKING FOR REFERRAL FOR HER THYROID. ASKED PATIENT TO CALL BACK TO [facility] WITH MORE INFORMATION ABOUT WHAT SHE IS REQUESTING.

## 2015-01-02 ENCOUNTER — Telehealth: Payer: Self-pay | Admitting: *Deleted

## 2015-01-02 NOTE — Telephone Encounter (Signed)
i suspect this is musculoskeletal based on the description but cannot accurately assess without seeing her in person. If symptoms worsen and are associated with sob, lightheadness, palpitations, she needs to have it checked out

## 2015-01-02 NOTE — Telephone Encounter (Signed)
Pt has complaint of sharp pain from under her arm radiating down right side across right chest and across shoulder blades. She is concerned as it is really sharp. I called her back and asked if she was having any respiratory distress or cardiac discomfort. I advised the patient that if she feels threatened by this pain she should seek urgent care or go to the ER.  I told here I would post the message to Dr. Naaman Plummer to see if he had any additional thoughts considering her dx's

## 2015-01-05 ENCOUNTER — Other Ambulatory Visit: Payer: Self-pay

## 2015-01-18 ENCOUNTER — Other Ambulatory Visit: Payer: Self-pay | Admitting: Physical Medicine & Rehabilitation

## 2015-01-18 ENCOUNTER — Other Ambulatory Visit: Payer: Self-pay | Admitting: Internal Medicine

## 2015-01-19 NOTE — Telephone Encounter (Signed)
Has aug appt with PCP

## 2015-01-27 ENCOUNTER — Encounter: Payer: Medicare HMO | Attending: Physical Medicine and Rehabilitation | Admitting: Physical Medicine & Rehabilitation

## 2015-01-27 ENCOUNTER — Encounter: Payer: Self-pay | Admitting: Physical Medicine & Rehabilitation

## 2015-01-27 VITALS — BP 154/86 | HR 78 | Resp 14

## 2015-01-27 DIAGNOSIS — M47816 Spondylosis without myelopathy or radiculopathy, lumbar region: Secondary | ICD-10-CM | POA: Diagnosis not present

## 2015-01-27 DIAGNOSIS — M19011 Primary osteoarthritis, right shoulder: Secondary | ICD-10-CM

## 2015-01-27 DIAGNOSIS — F418 Other specified anxiety disorders: Secondary | ICD-10-CM

## 2015-01-27 DIAGNOSIS — Z79899 Other long term (current) drug therapy: Secondary | ICD-10-CM | POA: Diagnosis present

## 2015-01-27 DIAGNOSIS — G43009 Migraine without aura, not intractable, without status migrainosus: Secondary | ICD-10-CM | POA: Diagnosis not present

## 2015-01-27 DIAGNOSIS — M75101 Unspecified rotator cuff tear or rupture of right shoulder, not specified as traumatic: Secondary | ICD-10-CM

## 2015-01-27 DIAGNOSIS — G894 Chronic pain syndrome: Secondary | ICD-10-CM

## 2015-01-27 DIAGNOSIS — M797 Fibromyalgia: Secondary | ICD-10-CM

## 2015-01-27 DIAGNOSIS — Z5181 Encounter for therapeutic drug level monitoring: Secondary | ICD-10-CM | POA: Diagnosis present

## 2015-01-27 DIAGNOSIS — Z8781 Personal history of (healed) traumatic fracture: Secondary | ICD-10-CM

## 2015-01-27 MED ORDER — HYDROCODONE-ACETAMINOPHEN 5-325 MG PO TABS
1.0000 | ORAL_TABLET | Freq: Three times a day (TID) | ORAL | Status: DC | PRN
Start: 2015-01-27 — End: 2015-04-22

## 2015-01-27 MED ORDER — VENLAFAXINE HCL 100 MG PO TABS: 100.0000 mg | ORAL_TABLET | Freq: Three times a day (TID) | ORAL | Status: DC

## 2015-01-27 NOTE — Patient Instructions (Signed)
WEAR YOUR RIGHT ANKLE BRACE WHEN YOU'RE WORKING AROUND THE HOUSE OR WALKING OUTSIDE THE HOUSE

## 2015-01-27 NOTE — Progress Notes (Signed)
Subjective:    Patient ID: [name] [name], female    DOB: [birth year], 69 y.o.   MRN: [id number]  HPI   [name] is here in follow up of her chronic issues. She tells me that she has had increased urinary urgency especially over the last few weeks. She has had urodynamic testing to confirm. The symptoms are worse with anxiety and stress. Apparently she is set up for a trial of a bladder stimulator. The patient had questions about whether she should proceed or not.  She has had ongoing pain in her right shoulder. i last injected the shoulder in November last year, and she feels that over the last month or two it has begun to bother her again. She feels that her wrists bother her when she's more active around the house---if they act up, she has to brace and rest them for a couple days.   She maintains on the hydrocodone for breakthrough pain.       Pain Inventory Average Pain 8 Pain Right Now 6 My pain is constant, burning, dull and aching  In the last 24 hours, has pain interfered with the following? General activity 7 Relation with others 9 Enjoyment of life 7 What TIME of day is your pain at its worst? daytime, evening Sleep (in general) Fair  Pain is worse with: walking and some activites Pain improves with: rest, heat/ice, therapy/exercise, medication and injections Relief from Meds: 8  Mobility Do you have any goals in this area?  yes  Function Do you have any goals in this area?  yes  Neuro/Psych bladder control problems weakness numbness dizziness anxiety  Prior Studies Any changes since last visit?  no  Physicians involved in your care Any changes since last visit?  no   Family History  Problem Relation Age of Onset  . Cancer      breast -- grandmother  . Cancer      lung -- uncles (3)  . Leukemia Sister   . Diabetes Father   . Diabetes Mother   . Coronary artery disease Father    History   Social History  . Marital Status: Married    Spouse  Name: N/A  . Number of Children: N/A  . Years of Education: N/A   Social History Main Topics  . Smoking status: Never Smoker   . Smokeless tobacco: Never Used  . Alcohol Use: No  . Drug Use: No  . Sexual Activity: Not on file   Other Topics Concern  . None   Social History Narrative   Past Surgical History  Procedure Laterality Date  . Cholecystectomy    . Abdominal hysterectomy  1984  . Breast surgery    . Cardiac catheterization    . Thyroidectomy    . Neuromas      removed from feet  . Wrist surgery      left  . Orif ankle fracture  01/10/2012    Procedure: OPEN REDUCTION INTERNAL FIXATION (ORIF) ANKLE FRACTURE;  Surgeon: Sanjuana Kava, MD;  Location: [facility];  Service: Orthopedics;  Laterality: Left;   Past Medical History  Diagnosis Date  . DM type 2 (diabetes mellitus, type 2)   . TIA (transient ischemic attack) 2010  . Anxiety   . GERD (gastroesophageal reflux disease)   . HTN (hypertension)   . Hypothyroidism   . Depression   . Allergic rhinitis   . Fibromyalgia   . Hemorrhoids   . Migraine headache   . Chest  pain     Catheterization 2004 normal coronary arteries  /  nuclear August 2 007, no ischemia  . Joint pain     Pains in multiple joints  . Shortness of breath     March, 2012  . Elevated alkaline phosphatase level   . Dysphagia   . Hiatal hernia   . Abdominal pain, RUQ (right upper quadrant)   . Hypercholesteremia   . Hematochezia   . UTI (lower urinary tract infection)   . Cervical radiculopathy   . Rotator cuff syndrome of right shoulder   . Chronic neck pain   . Chronic back pain     R > L  . Chronic shoulder pain, right   . Lumbar radicular pain     R>L legs  . History of ankle fracture 01/08/2012    Status post open reduction internal fixation of a left ankle trimalleolar fracture by Dr. Iona Hansen on 01/10/2012.     BP 154/86 mmHg  Pulse 78  Resp 14  SpO2 95%  Opioid Risk Score:   Fall Risk Score:  `1  Depression screen  PHQ 2/9  Depression screen [facility] 2/9 10/28/2014 09/18/2014 07/24/2014 03/26/2014 02/21/2014 01/27/2014 11/06/2013  Decreased Interest 0 0 0 0 0 0 0  Down, Depressed, Hopeless 1 0 0 0 0 0 1  PHQ - 2 Score 1 0 0 0 0 0 1  Altered sleeping 3 - - - - - -  Tired, decreased energy 3 - - - - - -  Change in appetite 2 - - - - - -  Feeling bad or failure about yourself  0 - - - - - -  Trouble concentrating 0 - - - - - -  Moving slowly or fidgety/restless 0 - - - - - -  Suicidal thoughts 0 - - - - - -  PHQ-9 Score 9 - - - - - -     Review of Systems  Constitutional:       Weight gain Fever/chills  Respiratory: Positive for shortness of breath.   Cardiovascular: Positive for leg swelling.  Genitourinary: Positive for urgency and frequency.       Incontinence  Skin: Positive for rash.  Neurological: Positive for dizziness, weakness and numbness.  Psychiatric/Behavioral: The patient is nervous/anxious.   All other systems reviewed and are negative.      Objective:   Physical Exam  Constitutional: She is oriented to person, place, and time. She appears well-developed and well-nourished.  HENT: vocal quality appears normal. No coughing.  Head: Normocephalic and atraumatic.  Eyes: Conjunctivae and EOM are normal. Pupils are equal, round, and reactive to light.  Neck: Normal range of motion.  Cardiovascular: Normal rate and regular rhythm.  Pulmonary/Chest: Effort normal.  Abdominal: Soft. Bowel sounds are normal.  Musculoskeletal:  Patient has right rotator cuff impingement signs which were positive. Pain noted at right subacromial space with palpation.   Cervical paraspinals, upper traps remain tight on right more than left. She had tenderness with palpation. Scapulae are tight. Head forward posture.  Neurological: She is alert and oriented to person, place, and time. No weakness or obvious sensory changes are noted.   Assessment & Plan:   ASSESSMENT:  1. History of fibromyalgia with  myofascial pain. Both scapulae are tight.  2. Chronic low back pain with lumbar disc and facet disease.  3. Right otator cuff syndrome./right bicipital tendonitis  4. Anxiety and depression.  5. Cervical stenosis with left C7 radiculopathy  6. Hx of fall with left trimalleolar ankle fx.  7. Left greater troch bursitis  8. Left and right wrist pain, extensor tendonitis  9. Functional dysphagia improved with small bites/ plenty of fluid with meals    PLAN:  1. hydrocodone 5/325 for breakthrough pain.  2. Bladder stimulator trial per urology.  3. Continue with neurontin for her general FMS pain. Continue klonopin for sleep/ hs anxiety  4. Continue with Zanaflex for spasms and back pain.  5. Maintain effexor IR at 100mg  BID for now. Increase to TID if needed if foot pain persists.  6. After informed consent and preparation of the skin with betadine and isopropyl alcohol, I injected 6mg  (1cc) of celestone and 4cc of 1% lidocaine into the right subacromial space via lateral approach. Additionally, aspiration was performed prior to injection. The patient tolerated well, and no complications were encountered. Afterward the area was cleaned and dressed. Post- injection instructions were provided.   7. Still needs to work on better skills for organization, memory, etc. We discussed the fact that her anxiety is a major factor in all of these injuries.   8. All questions were encouraged and answered. i will see her back in 3 months. 30 minutes were spent with the patient today.

## 2015-02-04 ENCOUNTER — Other Ambulatory Visit: Payer: Self-pay | Admitting: Urology

## 2015-02-06 ENCOUNTER — Encounter (HOSPITAL_BASED_OUTPATIENT_CLINIC_OR_DEPARTMENT_OTHER): Payer: Self-pay | Admitting: *Deleted

## 2015-02-09 ENCOUNTER — Encounter (HOSPITAL_BASED_OUTPATIENT_CLINIC_OR_DEPARTMENT_OTHER): Payer: Self-pay | Admitting: *Deleted

## 2015-02-09 MED ORDER — FENTANYL CITRATE (PF) 100 MCG/2ML IJ SOLN
INTRAMUSCULAR | Status: AC
  Filled 2015-02-09: qty 4

## 2015-02-09 MED ORDER — KETAMINE HCL 10 MG/ML IJ SOLN
INTRAMUSCULAR | Status: AC
  Filled 2015-02-09: qty 1

## 2015-02-09 MED ORDER — MIDAZOLAM HCL 2 MG/2ML IJ SOLN
INTRAMUSCULAR | Status: AC
  Filled 2015-02-09: qty 2

## 2015-02-09 NOTE — Progress Notes (Signed)
NPO AFTER MN.  ARRIVE AT 0700.  NEED ISTAT AND EKG.  WILL TAKE AM MEDS W/ SIPS OF WATER.

## 2015-02-09 NOTE — H&P (Signed)
History of Present Illness   [name] is basically dry. She is starting to leak a little bit now. She is not having the flooding episodes. Frequency is much less.  Device turned off. She is leaking on the left side. Incision looks great. It was easy to remove the device.  Frequency is much less. She kept a diary.    Past Medical History Problems  1. History of Anxiety (F41.9) 2. History of Arthritis 3. History of Asthma (J45.909) 4. History of diabetes mellitus (Z86.39) 5. History of esophageal reflux (Z87.19) 6. History of hypercholesterolemia (Z86.39) 7. History of hypertension (Z86.79) 8. History of Hyperthyroidism (E05.90)  Surgical History Problems  1. History of Breast Surgery 2. History of Foot Surgery 3. History of Gallbladder Surgery 4. History of Hysterectomy 5. History of Thyroid Surgery Total Thyroidectomy 6. History of Wrist Surgery  Current Meds 1. Ciprofloxacin HCl - 250 MG Oral Tablet; TAKE 1 TABLET BID;  Therapy: 13Apr2016 to (Evaluate:20Apr2016)  Requested for: 13Apr2016; Last  Rx:13Apr2016 Ordered 2. ClonazePAM 0.5 MG Oral Tablet;  Therapy: 22Oct2012 to Recorded 3. Colace CAPS;  Therapy: (Recorded:20Mar2013) to Recorded 4. Diltiazem HCl ER Beads 240 MG Oral Capsule Extended Release 24 Hour;  Therapy: [id number] to Recorded 5. Flonase 50 MCG/ACT SUSP;  Therapy: (Recorded:20Mar2013) to Recorded 6. Gabapentin 300 MG Oral Capsule;  Therapy: 00PQZ3007 to Recorded 7. Hydrocodone-Acetaminophen 7.5-325 MG Oral Tablet;  Therapy: [id number] to Recorded 8. Levothyroxine Sodium 112 MCG Oral Tablet;  Therapy: 18Sep2013 to Recorded 9. Losartan Potassium 25 MG Oral Tablet;  Therapy: 28Feb2013 to Recorded 10. Methocarbamol 500 MG Oral Tablet;   Therapy: 28Sep2012 to Recorded 11. Pravastatin Sodium 20 MG Oral Tablet;   Therapy: 56YBW3893 to Recorded 12. [place];   Therapy: (Recorded:20Mar2013) to Recorded 13. ProAir HFA 108 (90 Base) MCG/ACT Inhalation  Aerosol Solution;   Therapy: [id number] to Recorded 14. TiZANidine HCl - 2 MG Oral Tablet;   Therapy: 15BWI2035 to Recorded 15. Trimethoprim 100 MG Oral Tablet; Take 1 tablet by mouth daily;   Therapy: 08Oct2013 to (Evaluate:11Dec2015)  Requested for: 59RCB6384; Last   Rx:11Nov2015 Ordered 16. Trimethoprim 100 MG Oral Tablet; Take 1 tablet by mouth every day;   Therapy: 06Aug2015 to (Evaluate:31Jul2016)  Requested for: 06Aug2015; Last   Rx:06Aug2015 Ordered 17. Venlafaxine HCl ER 150 MG Oral Capsule Extended Release 24 Hour;   Therapy: 53MIW8032 to Recorded  Allergies Medication  1. Diclofenac Sodium ER TB24 2. NSAIDs 3. Penicillins 4. Relafen TABS  Family History Problems  1. Family history of Chronic Renal Failure : Mother 2. Family history of Death In The Family Father : Father   father passed  @ age 5cardiac arrest 42. Family history of Death In The Family Mother : Mother   mother passed @ age 73lung and kidney failure 4. Family history of Diabetes Mellitus : Mother 5. Family history of Diabetes Mellitus : Father 6. Family history of Diabetes Mellitus : Brother 7. Family history of Family Health Status Number Of Children   2 daughters  Social History Problems  1. Denied: History of Alcohol Use 2. Caffeine Use   8 drinks daily 3. Marital History - Currently Married 4. Never A Smoker 5. Occupation:   disabled 40. Denied: History of Tobacco Use  Results/Data  Urine [Data Includes: Last 1 Day]   12YQM2500  COLOR YELLOW   APPEARANCE CLEAR   SPECIFIC GRAVITY 1.010   pH 7.0   GLUCOSE NEGATIVE   BILIRUBIN NEGATIVE   KETONE NEGATIVE   BLOOD 1+  PROTEIN NEGATIVE   NITRITE NEGATIVE   LEUKOCYTE ESTERASE NEGATIVE   SQUAMOUS EPITHELIAL/HPF 0-5 HPF  WBC 0-5 WBC/HPF  RBC 0-2 RBC/HPF  BACTERIA NONE SEEN HPF  CRYSTALS NONE SEEN HPF  CASTS NONE SEEN LPF  Yeast NONE SEEN HPF   Assessment Assessed  1. Urge and stress incontinence (N39.46)  Plan Urge and  stress incontinence  1. Follow-up Schedule Surgery Office  Follow-up  Status: Complete  Done: [id number] 2. Follow-up Schedule Surgery Office  Follow-up  Status: Hold For - Appointment   Requested for: 27Jul2016  Discussion/Summary   She is scheduled for Interstim on Tuesday. Rare risk of ineffectiveness is discussed _____ of positive PND.  After a thorough review of the management options for the patient's condition the patient  elected to proceed with surgical therapy as noted above. We have discussed the potential benefits and risks of the procedure, side effects of the proposed treatment, the likelihood of the patient achieving the goals of the procedure, and any potential problems that might occur during the procedure or recuperation. Informed consent has been obtained.

## 2015-02-10 ENCOUNTER — Ambulatory Visit (HOSPITAL_BASED_OUTPATIENT_CLINIC_OR_DEPARTMENT_OTHER)
Admission: RE | Admit: 2015-02-10 | Discharge: 2015-02-10 | Disposition: A | Discharge status: Home / Self Care | Payer: Medicare HMO | Source: Ambulatory Visit | Attending: Urology | Admitting: Urology

## 2015-02-10 ENCOUNTER — Ambulatory Visit (HOSPITAL_COMMUNITY): Payer: Medicare HMO

## 2015-02-10 ENCOUNTER — Encounter (HOSPITAL_BASED_OUTPATIENT_CLINIC_OR_DEPARTMENT_OTHER)
Admission: RE | Disposition: A | Discharge status: Home / Self Care | Payer: Self-pay | Source: Ambulatory Visit | Attending: Urology

## 2015-02-10 ENCOUNTER — Other Ambulatory Visit: Payer: Self-pay

## 2015-02-10 ENCOUNTER — Encounter (HOSPITAL_BASED_OUTPATIENT_CLINIC_OR_DEPARTMENT_OTHER): Payer: Self-pay | Admitting: Anesthesiology

## 2015-02-10 ENCOUNTER — Ambulatory Visit (HOSPITAL_BASED_OUTPATIENT_CLINIC_OR_DEPARTMENT_OTHER): Payer: Medicare HMO | Admitting: Anesthesiology

## 2015-02-10 DIAGNOSIS — Z7951 Long term (current) use of inhaled steroids: Secondary | ICD-10-CM | POA: Diagnosis not present

## 2015-02-10 DIAGNOSIS — Z79899 Other long term (current) drug therapy: Secondary | ICD-10-CM | POA: Insufficient documentation

## 2015-02-10 DIAGNOSIS — Z792 Long term (current) use of antibiotics: Secondary | ICD-10-CM | POA: Diagnosis not present

## 2015-02-10 DIAGNOSIS — Z841 Family history of disorders of kidney and ureter: Secondary | ICD-10-CM | POA: Diagnosis not present

## 2015-02-10 DIAGNOSIS — R51 Headache: Secondary | ICD-10-CM | POA: Diagnosis not present

## 2015-02-10 DIAGNOSIS — N3946 Mixed incontinence: Secondary | ICD-10-CM | POA: Diagnosis present

## 2015-02-10 DIAGNOSIS — G709 Myoneural disorder, unspecified: Secondary | ICD-10-CM | POA: Insufficient documentation

## 2015-02-10 DIAGNOSIS — I10 Essential (primary) hypertension: Secondary | ICD-10-CM | POA: Insufficient documentation

## 2015-02-10 DIAGNOSIS — K449 Diaphragmatic hernia without obstruction or gangrene: Secondary | ICD-10-CM | POA: Diagnosis not present

## 2015-02-10 DIAGNOSIS — F419 Anxiety disorder, unspecified: Secondary | ICD-10-CM | POA: Diagnosis not present

## 2015-02-10 DIAGNOSIS — R109 Unspecified abdominal pain: Secondary | ICD-10-CM

## 2015-02-10 DIAGNOSIS — M1990 Unspecified osteoarthritis, unspecified site: Secondary | ICD-10-CM | POA: Diagnosis not present

## 2015-02-10 DIAGNOSIS — E119 Type 2 diabetes mellitus without complications: Secondary | ICD-10-CM | POA: Insufficient documentation

## 2015-02-10 DIAGNOSIS — J45909 Unspecified asthma, uncomplicated: Secondary | ICD-10-CM | POA: Insufficient documentation

## 2015-02-10 DIAGNOSIS — Z79891 Long term (current) use of opiate analgesic: Secondary | ICD-10-CM | POA: Diagnosis not present

## 2015-02-10 DIAGNOSIS — E0590 Thyrotoxicosis, unspecified without thyrotoxic crisis or storm: Secondary | ICD-10-CM | POA: Diagnosis not present

## 2015-02-10 DIAGNOSIS — Z8673 Personal history of transient ischemic attack (TIA), and cerebral infarction without residual deficits: Secondary | ICD-10-CM | POA: Diagnosis not present

## 2015-02-10 DIAGNOSIS — K219 Gastro-esophageal reflux disease without esophagitis: Secondary | ICD-10-CM | POA: Insufficient documentation

## 2015-02-10 DIAGNOSIS — E780 Pure hypercholesterolemia: Secondary | ICD-10-CM | POA: Diagnosis not present

## 2015-02-10 DIAGNOSIS — M797 Fibromyalgia: Secondary | ICD-10-CM | POA: Insufficient documentation

## 2015-02-10 DIAGNOSIS — R32 Unspecified urinary incontinence: Secondary | ICD-10-CM

## 2015-02-10 DIAGNOSIS — F329 Major depressive disorder, single episode, unspecified: Secondary | ICD-10-CM | POA: Diagnosis not present

## 2015-02-10 HISTORY — DX: Other specified postprocedural states: R11.2

## 2015-02-10 HISTORY — PX: INTERSTIM IMPLANT PLACEMENT: SHX5130

## 2015-02-10 HISTORY — DX: Personal history of other endocrine, nutritional and metabolic disease: Z86.39

## 2015-02-10 HISTORY — DX: Hyperlipidemia, unspecified: E78.5

## 2015-02-10 HISTORY — DX: Other specified postprocedural states: Z98.890

## 2015-02-10 HISTORY — DX: Postprocedural hypothyroidism: E89.0

## 2015-02-10 HISTORY — DX: Personal history of transient ischemic attack (TIA), and cerebral infarction without residual deficits: Z86.73

## 2015-02-10 HISTORY — DX: Presence of spectacles and contact lenses: Z97.3

## 2015-02-10 HISTORY — DX: Personal history of other benign neoplasm: Z86.018

## 2015-02-10 HISTORY — DX: Mild intermittent asthma, uncomplicated: J45.20

## 2015-02-10 HISTORY — DX: Personal history of other diseases of the digestive system: Z87.19

## 2015-02-10 HISTORY — DX: Urge incontinence: N39.41

## 2015-02-10 LAB — POCT I-STAT 4, (NA,K, GLUC, HGB,HCT)
Glucose, Bld: 100 mg/dL — ABNORMAL HIGH (ref 65–99)
HEMATOCRIT: 39 % (ref 36.0–46.0)
Hemoglobin: 13.3 g/dL (ref 12.0–15.0)
POTASSIUM: 4.2 mmol/L (ref 3.5–5.1)
Sodium: 141 mmol/L (ref 135–145)

## 2015-02-10 SURGERY — INSERTION, SACRAL NERVE STIMULATOR, INTERSTIM, STAGE 1
Anesthesia: Monitor Anesthesia Care | Site: Back

## 2015-02-10 MED ORDER — SULFAMETHOXAZOLE-TRIMETHOPRIM 800-160 MG PO TABS: 1.0000 | ORAL_TABLET | Freq: Two times a day (BID) | ORAL | Status: AC

## 2015-02-10 MED ORDER — LACTATED RINGERS IV SOLN
INTRAVENOUS | Status: DC
Start: 2015-02-10 — End: 2015-02-10
  Filled 2015-02-10: qty 1000

## 2015-02-10 MED ORDER — LIDOCAINE-EPINEPHRINE (PF) 1 %-1:200000 IJ SOLN
INTRAMUSCULAR | Status: DC | PRN
  Administered 2015-02-10: 18 mL

## 2015-02-10 MED ORDER — HYDROCODONE-ACETAMINOPHEN 5-325 MG PO TABS: 1.0000 | ORAL_TABLET | Freq: Four times a day (QID) | ORAL | Status: DC | PRN

## 2015-02-10 MED ORDER — FENTANYL CITRATE (PF) 100 MCG/2ML IJ SOLN
INTRAMUSCULAR | Status: AC
  Filled 2015-02-10: qty 4

## 2015-02-10 MED ORDER — PROPOFOL 10 MG/ML IV BOLUS
INTRAVENOUS | Status: DC | PRN
  Administered 2015-02-10: 20 mg via INTRAVENOUS

## 2015-02-10 MED ORDER — BUPIVACAINE-EPINEPHRINE 0.5% -1:200000 IJ SOLN
INTRAMUSCULAR | Status: DC | PRN
  Administered 2015-02-10: 18 mL

## 2015-02-10 MED ORDER — LACTATED RINGERS IV SOLN
INTRAVENOUS | Status: DC
  Administered 2015-02-10 (×3): via INTRAVENOUS
  Filled 2015-02-10: qty 1000

## 2015-02-10 MED ORDER — MEPERIDINE HCL 25 MG/ML IJ SOLN
6.2500 mg | INTRAMUSCULAR | Status: DC | PRN
  Filled 2015-02-10: qty 1

## 2015-02-10 MED ORDER — MIDAZOLAM HCL 2 MG/2ML IJ SOLN
INTRAMUSCULAR | Status: AC
  Filled 2015-02-10: qty 4

## 2015-02-10 MED ORDER — PROMETHAZINE HCL 25 MG/ML IJ SOLN
6.2500 mg | INTRAMUSCULAR | Status: DC | PRN
  Filled 2015-02-10: qty 1

## 2015-02-10 MED ORDER — FENTANYL CITRATE (PF) 100 MCG/2ML IJ SOLN
25.0000 ug | INTRAMUSCULAR | Status: DC | PRN
  Filled 2015-02-10: qty 1

## 2015-02-10 MED ORDER — FENTANYL CITRATE (PF) 100 MCG/2ML IJ SOLN
INTRAMUSCULAR | Status: DC | PRN
  Administered 2015-02-10 (×6): 12.5 ug via INTRAVENOUS

## 2015-02-10 MED ORDER — ACETAMINOPHEN 10 MG/ML IV SOLN
INTRAVENOUS | Status: DC | PRN
Start: 2015-02-10 — End: 2015-02-10
  Administered 2015-02-10: 1000 mg via INTRAVENOUS

## 2015-02-10 MED ORDER — MIDAZOLAM HCL 5 MG/5ML IJ SOLN
INTRAMUSCULAR | Status: DC | PRN
  Administered 2015-02-10: 1 mg via INTRAVENOUS
  Administered 2015-02-10 (×3): 0.5 mg via INTRAVENOUS
  Administered 2015-02-10: 1 mg via INTRAVENOUS
  Administered 2015-02-10: 0.5 mg via INTRAVENOUS

## 2015-02-10 MED ORDER — VANCOMYCIN HCL IN DEXTROSE 1-5 GM/200ML-% IV SOLN
1000.0000 mg | INTRAVENOUS | Status: AC
  Administered 2015-02-10: 1000 mg via INTRAVENOUS
  Filled 2015-02-10: qty 200

## 2015-02-10 MED ORDER — ONDANSETRON HCL 4 MG/2ML IJ SOLN
INTRAMUSCULAR | Status: DC | PRN
  Administered 2015-02-10: 4 mg via INTRAVENOUS

## 2015-02-10 MED ORDER — PROPOFOL 500 MG/50ML IV EMUL
INTRAVENOUS | Status: DC | PRN
  Administered 2015-02-10: 50 ug/kg/min via INTRAVENOUS

## 2015-02-10 MED ORDER — DEXAMETHASONE SODIUM PHOSPHATE 4 MG/ML IJ SOLN
INTRAMUSCULAR | Status: DC | PRN
  Administered 2015-02-10: 4 mg via INTRAVENOUS

## 2015-02-10 MED ORDER — VANCOMYCIN HCL 500 MG IV SOLR
INTRAVENOUS | Status: AC
  Filled 2015-02-10: qty 500

## 2015-02-10 MED ORDER — LIDOCAINE HCL (CARDIAC) 20 MG/ML IV SOLN
INTRAVENOUS | Status: DC | PRN
  Administered 2015-02-10: 40 mg via INTRAVENOUS

## 2015-02-10 SURGICAL SUPPLY — 68 items
ANTNA NRSTM XTRN TELEM NS LF (UROLOGICAL SUPPLIES) ×1
APL SKNCLS STERI-STRIP NONHPOA (GAUZE/BANDAGES/DRESSINGS) ×2
BAG URINE DRAINAGE (UROLOGICAL SUPPLIES) ×2 IMPLANT
BAG URINE LEG 500ML (DRAIN) IMPLANT
BANDAGE ADHESIVE 1X3 (GAUZE/BANDAGES/DRESSINGS) IMPLANT
BENZOIN TINCTURE PRP APPL 2/3 (GAUZE/BANDAGES/DRESSINGS) ×4 IMPLANT
BLADE HEX COATED 2.75 (ELECTRODE) ×2 IMPLANT
BLADE SURG 15 STRL LF DISP TIS (BLADE) ×1 IMPLANT
BLADE SURG 15 STRL SS (BLADE) ×2
CATH FOLEY 2WAY SLVR  5CC 16FR (CATHETERS) ×1
CATH FOLEY 2WAY SLVR 5CC 16FR (CATHETERS) ×1 IMPLANT
CLOTH BEACON ORANGE TIMEOUT ST (SAFETY) ×2 IMPLANT
COVER BACK TABLE 60X90IN (DRAPES) ×2 IMPLANT
COVER MAYO STAND STRL (DRAPES) ×2 IMPLANT
COVER PROBE W GEL 5X96 (DRAPES) ×2 IMPLANT
DRAPE C-ARM 42X72 X-RAY (DRAPES) ×4 IMPLANT
DRAPE INCISE 23X17 IOBAN STRL (DRAPES) ×1
DRAPE INCISE 23X17 STRL (DRAPES) ×1 IMPLANT
DRAPE INCISE IOBAN 23X17 STRL (DRAPES) ×1 IMPLANT
DRAPE INCISE IOBAN 66X45 STRL (DRAPES) ×2 IMPLANT
DRAPE LAPAROSCOPIC ABDOMINAL (DRAPES) ×2 IMPLANT
DRAPE LG THREE QUARTER DISP (DRAPES) ×2 IMPLANT
DRSG TEGADERM 2-3/8X2-3/4 SM (GAUZE/BANDAGES/DRESSINGS) ×2 IMPLANT
DRSG TEGADERM 4X4.75 (GAUZE/BANDAGES/DRESSINGS) ×2 IMPLANT
DRSG TELFA 3X8 NADH (GAUZE/BANDAGES/DRESSINGS) ×2 IMPLANT
ELECT REM PT RETURN 9FT ADLT (ELECTROSURGICAL) ×2
ELECTRODE REM PT RTRN 9FT ADLT (ELECTROSURGICAL) ×1 IMPLANT
GLOVE BIO SURGEON STRL SZ7.5 (GLOVE) ×2 IMPLANT
GLOVE BIOGEL M 6.5 STRL (GLOVE) ×1 IMPLANT
GLOVE BIOGEL M 7.0 STRL (GLOVE) ×1 IMPLANT
GLOVE BIOGEL PI IND STRL 6.5 (GLOVE) IMPLANT
GLOVE BIOGEL PI IND STRL 7.5 (GLOVE) IMPLANT
GLOVE BIOGEL PI INDICATOR 6.5 (GLOVE) ×1
GLOVE BIOGEL PI INDICATOR 7.5 (GLOVE) ×1
GOWN STRL REUS W/ TWL LRG LVL3 (GOWN DISPOSABLE) ×1 IMPLANT
GOWN STRL REUS W/ TWL XL LVL3 (GOWN DISPOSABLE) ×1 IMPLANT
GOWN STRL REUS W/TWL LRG LVL3 (GOWN DISPOSABLE) ×2
GOWN STRL REUS W/TWL XL LVL3 (GOWN DISPOSABLE) ×4
HOLDER FOLEY CATH W/STRAP (MISCELLANEOUS) ×2 IMPLANT
INTRODUCER GUIDE DILATR SHEATH (SET/KITS/TRAYS/PACK) ×2 IMPLANT
KIT INTERSTIM LEAD TINED 28CM (Urological Implant) ×1 IMPLANT
LIQUID BAND (GAUZE/BANDAGES/DRESSINGS) ×2 IMPLANT
MANIFOLD NEPTUNE II (INSTRUMENTS) IMPLANT
NEEDLE FORAMEN 20GA 3.5  9CM (NEEDLE) IMPLANT
NEEDLE FORAMEN 20GA 5  12.5CM (NEEDLE) IMPLANT
NEEDLE HYPO 22GX1.5 SAFETY (NEEDLE) ×2 IMPLANT
PACK BASIN DAY SURGERY FS (CUSTOM PROCEDURE TRAY) ×2 IMPLANT
PAD DRESSING TELFA 3X8 NADH (GAUZE/BANDAGES/DRESSINGS) ×1 IMPLANT
PENCIL BUTTON HOLSTER BLD 10FT (ELECTRODE) ×2 IMPLANT
PROGRAMMER ANTENNA EXT (UROLOGICAL SUPPLIES) ×2 IMPLANT
PROGRAMMER STIMUL 2.2X1.1X3.7 (UROLOGICAL SUPPLIES) ×2 IMPLANT
SPONGE GAUZE 4X4 12PLY STER LF (GAUZE/BANDAGES/DRESSINGS) IMPLANT
STAPLER VISISTAT 35W (STAPLE) IMPLANT
STIMULATOR INTERSTIM 2X1.7X.3 (Orthopedic Implant) ×2 IMPLANT
STRIP CLOSURE SKIN 1/2X4 (GAUZE/BANDAGES/DRESSINGS) ×2 IMPLANT
SUCTION FRAZIER TIP 10 FR DISP (SUCTIONS) ×2 IMPLANT
SUT SILK 2 0 (SUTURE) ×2
SUT SILK 2-0 18XBRD TIE 12 (SUTURE) ×1 IMPLANT
SUT VIC AB 3-0 SH 27 (SUTURE) ×4
SUT VIC AB 3-0 SH 27X BRD (SUTURE) ×2 IMPLANT
SUT VICRYL 4-0 PS2 18IN ABS (SUTURE) ×4 IMPLANT
SYR BULB IRRIGATION 50ML (SYRINGE) ×2 IMPLANT
SYR CONTROL 10ML LL (SYRINGE) ×2 IMPLANT
SYRINGE 10CC LL (SYRINGE) ×2 IMPLANT
TOWEL OR 17X24 6PK STRL BLUE (TOWEL DISPOSABLE) ×5 IMPLANT
TRAY DSU PREP LF (CUSTOM PROCEDURE TRAY) ×2 IMPLANT
TUBE CONNECTING 12X1/4 (SUCTIONS) ×2 IMPLANT
WATER STERILE IRR 500ML POUR (IV SOLUTION) ×2 IMPLANT

## 2015-02-10 NOTE — Anesthesia Procedure Notes (Signed)
Procedure Name: MAC Date/Time: 02/10/2015 8:25 AM Performed by: Justice Rocher Pre-anesthesia Checklist: Suction available, Emergency Drugs available, Patient identified, Patient being monitored and Timeout performed Patient Re-evaluated:Patient Re-evaluated prior to inductionOxygen Delivery Method: Simple face mask Preoxygenation: Pre-oxygenation with 100% oxygen Intubation Type: IV induction Placement Confirmation: positive ETCO2 and breath sounds checked- equal and bilateral

## 2015-02-10 NOTE — Interval H&P Note (Signed)
History and Physical Interval Note:  02/10/2015 7:00 AM  [name] [name]  has presented today for surgery, with the diagnosis of URGENT CONTENCENCE FREQUENCY  The various methods of treatment have been discussed with the patient and family. After consideration of risks, benefits and other options for treatment, the patient has consented to  Procedure(s): INTERSTIM IMPLANT FIRST STAGE (N/A) INTERSTIM IMPLANT SECOND STAGE (N/A) as a surgical intervention .  The patient's history has been reviewed, patient examined, no change in status, stable for surgery.  I have reviewed the patient's chart and labs.  Questions were answered to the patient's satisfaction.     [name] A

## 2015-02-10 NOTE — Discharge Instructions (Signed)

## 2015-02-10 NOTE — Anesthesia Preprocedure Evaluation (Signed)
Anesthesia Evaluation  Patient identified by MRN, date of birth, ID band Patient awake    Reviewed: Allergy & Precautions, H&P , NPO status , Patient's Chart, lab work & pertinent test results  History of Anesthesia Complications (+) PONV and history of anesthetic complications  Airway Mallampati: I  TM Distance: >3 FB     Dental  (+) Teeth Intact   Pulmonary shortness of breath and with exertion, asthma ,  breath sounds clear to auscultation        Cardiovascular hypertension, Pt. on medications Rhythm:Regular Rate:Normal     Neuro/Psych  Headaches, PSYCHIATRIC DISORDERS Anxiety Depression TIA Neuromuscular disease    GI/Hepatic hiatal hernia, GERD-  Medicated and Controlled,  Endo/Other  Hypothyroidism   Renal/GU      Musculoskeletal  (+) Fibromyalgia -  Abdominal   Peds  Hematology   Anesthesia Other Findings   Reproductive/Obstetrics                             Anesthesia Physical  Anesthesia Plan  ASA: III  Anesthesia Plan: MAC   Post-op Pain Management:    Induction:   Airway Management Planned: Nasal Cannula  Additional Equipment:   Intra-op Plan:   Post-operative Plan:   Informed Consent: I have reviewed the patients History and Physical, chart, labs and discussed the procedure including the risks, benefits and alternatives for the proposed anesthesia with the patient or authorized representative who has indicated his/her understanding and acceptance.     Plan Discussed with:   Anesthesia Plan Comments:         Anesthesia Quick Evaluation

## 2015-02-10 NOTE — Anesthesia Postprocedure Evaluation (Signed)
  Anesthesia Post-op Note  Patient: [name] [name]  Procedure(s) Performed: Procedure(s) (LRB): INTERSTIM IMPLANT FIRST STAGE (N/A) INTERSTIM IMPLANT SECOND STAGE (N/A)  Patient Location: PACU  Anesthesia Type: MAC  Level of Consciousness: awake and alert   Airway and Oxygen Therapy: Patient Spontanous Breathing  Post-op Pain: mild  Post-op Assessment: Post-op Vital signs reviewed, Patient's Cardiovascular Status Stable, Respiratory Function Stable, Patent Airway and No signs of Nausea or vomiting  Last Vitals:  Filed Vitals:   02/10/15 1200  BP: 124/59  Pulse: 84  Temp: 36.6 C  Resp: 16    Post-op Vital Signs: stable   Complications: No apparent anesthesia complications

## 2015-02-10 NOTE — Op Note (Signed)
Post-operative Diagnosis:  Mixed urinary incontinence  Procedure and Anesthesia:  Procedure(s) and Anesthesia Type:    * INTERSTIM IMPLANT FIRST STAGE - Monitor Anesthesia Care    * INTERSTIM IMPLANT SECOND STAGE - Monitor Anesthesia Care  Surgeon: Surgeon(s) and Role:    * Bjorn Loser, MD - Primary   Resident:  Star Age, MD  EBL: 5 mL  IVF: See anesthesia record  UOP: See anesthesia record  Implants:  Implant Name Type Inv. Item Serial No. Manufacturer Lot No. LRB No. Used  Thomes Dinning - [id number] H Orthopedic Implant STIMULATOR INTERSTIM [id number] H [employer] [id number] H Right 1  KIT INTERSTIM LEAD TINED 28CM - [id number] Urological Implant KIT INTERSTIM LEAD TINED 28CM   [employer] UROLOGY GASTRO [id number] N/A 1    Specimens: none  Complications: * No complications entered in OR log *  Indications for Surgery: 69 y.o. female with refractory mixed urinary frequency . Risks, benefits, and alternatives of the above procedure were discussed previously in detail and informed consents was signed and verified.  Findings:  -Leads placed in the RIGHT S3 foramen -Generator placed in the RIGHT gluteal fat pad medial to the sacrum and inferior to the iliac crest -Excellent motor response (bellows and toe plunge) on all settings at low amplitude   Procedure Details:  The patient was correctly identified in the pre-op holding area and informed consent was signed. They were brought to the OR and general anesthesia was induced on the stretcher. IV antibiotics were administered after allergies were verified. They were then turned into the prone position taking care to pad all pressure points. The buttocks were spread and secured with tape. They were prepped and draped in the usual sterile fashion, leaving the feet and the anus exposed.  After pre-procedure timeout, landmarks were marked. The introducer needle was used to measure 9cm above the tip of the  coccyx at the midline. On the patients  side marks were made 1cm laterally to this midline mark to identify S3 foramen. Marks were make 1cm above and below on each side to identify S2 and S4. This was confirmed on fluoroscopy with S3 lying just below the sacroiliac joint.   The introducer needle was advanced at a 60 degree angle towards S3 foramen. The point of entry was approximately 1cm above the S3 foramen. The needle was felt entering the S3 foramen. Fluoroscopy was used to confirm placement. The leads were tested to assure appropriate depth.  The #3 lead was just posterior to the periosteum of the sacrum. We were initially not please with the trajectory of the leads as they were coursing more posteriorly and lead 0 and 1 were not generating any motor responses. At this point we elected to move to the patients RIGHT side and proceed in identical fashion. All leads had excellent responses on settings less than 1.5. Robust motor responses were achieved with all leads.  The yellow cap was unscrewed and the needle was removed, through which the wire was advanced to the 1st white line. The cannula was removed leaving just the wire. The #11 blade was used to make a 74m nick. The T angiocath sheath was introduced over the wire and advanced into the foramen. The internal cannula was removed along with the wire leaving the sheath. Next, the wire with the leads was advanced through the sheath to the 1st white line. Fluoroscopy was again used to confirm positioning. The sheath was pulled back to the second line to expose the leads but not deploy  the tines. The leads were tested. Good toe rotation and bellows were noted when stimulating all leads. The sheath was pulled back to deploy the tines and secure the leads into place. The internal wire and sheath were removed.   Next, a 4cm lateral incision was made to tunnel the leads over the ipsilateral fat pad. The pocket was enlarged using finger dissection. The  diamond-tipped tunneling device was used to tunnel from the exit site of the lead wire to the lateral incision. The tip was unscrewed and the tunneling device was removed leaving the straw in place. The lead was inserted into the straw advanced until it was seen coming out of the incision. The straw and the lead were grasped and pulled through.   The leads were then attached to the InterstimII generator device and were secured in place using the screwdriver. The generator was secured within it pocket and the deep subcutaneous tissues were closed with #3-0 Vicryl and the overlying skin was closed with #4-0 Vicrly in running subcuticular fashion. The additional foramen needle incision sites were closed with a pair of #3-0 Vicryl stitches each. Anesthesia was reversed and the patient awoke having tolerated the procedure well. She was taken to the PACU in stable condition.  Teaching Physician Attestation: Dr. Matilde Sprang was present and scrubbed for the entirety of the procedure  Pietro Cassis. Ottis Stain, MD Resident [facility] Department of Urologic Surgery/[facility]

## 2015-02-10 NOTE — Transfer of Care (Signed)
Immediate Anesthesia Transfer of Care Note  Patient: [name] [name]  Procedure(s) Performed: Procedure(s) (LRB): INTERSTIM IMPLANT FIRST STAGE (N/A) INTERSTIM IMPLANT SECOND STAGE (N/A)  Patient Location: PACU  Anesthesia Type: MAC  Level of Consciousness: awake, sedated, patient cooperative and responds to stimulation  Airway & Oxygen Therapy: Patient Spontanous Breathing and Patient connected to face mask oxygen  Post-op Assessment: Report given to PACU RN, Post -op Vital signs reviewed and stable and Patient moving all extremities  Post vital signs: Reviewed and stable  Complications: No apparent anesthesia complications

## 2015-02-11 ENCOUNTER — Encounter (HOSPITAL_BASED_OUTPATIENT_CLINIC_OR_DEPARTMENT_OTHER): Payer: Self-pay | Admitting: Urology

## 2015-02-12 ENCOUNTER — Other Ambulatory Visit: Payer: Self-pay | Admitting: Internal Medicine

## 2015-02-16 ENCOUNTER — Ambulatory Visit: Payer: Medicare HMO | Admitting: Internal Medicine

## 2015-02-16 NOTE — H&P (Signed)
History of Present Illness   [name] [name] is basically dry. She is starting to leak a little bit now. She is not having the flooding episodes. Frequency is much less.  Device turned off. She is leaking on the left side. Incision looks great. It was easy to remove the device.  Frequency is much less. She kept a diary.   Past Medical History Problems  1. History of Anxiety (F41.9) 2. History of Arthritis 3. History of Asthma (J45.909) 4. History of diabetes mellitus (Z86.39) 5. History of esophageal reflux (Z87.19) 6. History of hypercholesterolemia (Z86.39) 7. History of hypertension (Z86.79) 8. History of Hyperthyroidism (E05.90)  Surgical History Problems  1. History of Breast Surgery 2. History of Foot Surgery 3. History of Gallbladder Surgery 4. History of Hysterectomy 5. History of Thyroid Surgery Total Thyroidectomy 6. History of Wrist Surgery  Current Meds 1. Ciprofloxacin HCl - 250 MG Oral Tablet; TAKE 1 TABLET BID; Therapy: 13Apr2016 to (Evaluate:20Apr2016) Requested for: 13Apr2016; Last Rx:13Apr2016 Ordered 2. ClonazePAM 0.5 MG Oral Tablet; Therapy: 22Oct2012 to Recorded 3. Colace CAPS; Therapy: (Recorded:20Mar2013) to Recorded 4. Diltiazem HCl ER Beads 240 MG Oral Capsule Extended Release 24 Hour; Therapy: [id number] to Recorded 5. Flonase 50 MCG/ACT SUSP; Therapy: (Recorded:20Mar2013) to Recorded 6. Gabapentin 300 MG Oral Capsule; Therapy: [id number] to Recorded 7. Hydrocodone-Acetaminophen 7.5-325 MG Oral Tablet; Therapy: [id number] to Recorded 8. Levothyroxine Sodium 112 MCG Oral Tablet; Therapy: 18Sep2013 to Recorded 9. Losartan Potassium 25 MG Oral Tablet; Therapy: 28Feb2013 to Recorded 10. Methocarbamol 500 MG Oral Tablet;  Therapy: 28Sep2012 to Recorded 11. Pravastatin Sodium 20 MG Oral Tablet;  Therapy: [id number] to Recorded 12. Wright;  Therapy: (Recorded:20Mar2013) to Recorded 13. ProAir HFA 108 (90 Base) MCG/ACT  Inhalation Aerosol Solution;  Therapy: [id number] to Recorded 14. TiZANidine HCl - 2 MG Oral Tablet;  Therapy: 98PJA2505 to Recorded 15. Trimethoprim 100 MG Oral Tablet; Take 1 tablet by mouth daily;  Therapy: 08Oct2013 to (Evaluate:11Dec2015) Requested for: [id number]; Last  Rx:11Nov2015 Ordered 16. Trimethoprim 100 MG Oral Tablet; Take 1 tablet by mouth every day;  Therapy: 06Aug2015 to (Evaluate:31Jul2016) Requested for: 06Aug2015; Last  Rx:06Aug2015 Ordered 17. Venlafaxine HCl ER 150 MG Oral Capsule Extended Release 24 Hour;  Therapy: 37TKW4097 to Recorded  Allergies Medication  1. Diclofenac Sodium ER TB24 2. NSAIDs 3. Penicillins 4. Relafen TABS  Family History Problems  1. Family history of Chronic Renal Failure : Mother 2. Family history of Death In The Family Father : Father  father passed @ age 8cardiac arrest 23. Family history of Death In The Family Mother : Mother  mother passed @ age 17lung and kidney failure 4. Family history of Diabetes Mellitus : Mother 5. Family history of Diabetes Mellitus : Father 6. Family history of Diabetes Mellitus : Brother 7. Family history of Family Health Status Number Of Children  2 daughters  Social History Problems  1. Denied: History of Alcohol Use 2. Caffeine Use  8 drinks daily 3. Marital History - Currently Married 4. Never A Smoker 5. Occupation:  disabled 6. Denied: History of Tobacco Use  Results/Data  Urine [Data Includes: Last 1 Day]   [id number]  COLOR YELLOW   APPEARANCE CLEAR   SPECIFIC GRAVITY 1.010   pH 7.0   GLUCOSE NEGATIVE   BILIRUBIN NEGATIVE   KETONE NEGATIVE   BLOOD 1+   PROTEIN NEGATIVE   NITRITE NEGATIVE   LEUKOCYTE ESTERASE NEGATIVE   SQUAMOUS EPITHELIAL/HPF 0-5 HPF  WBC 0-5 WBC/HPF  RBC 0-2 RBC/HPF  BACTERIA NONE SEEN HPF  CRYSTALS NONE SEEN HPF  CASTS NONE SEEN LPF  Yeast NONE SEEN HPF   Assessment Assessed  1. Urge  and stress incontinence (N39.46)  Plan Urge and stress incontinence  1. Follow-up Schedule Surgery Office Follow-up Status: Complete Done: [id number] 2. Follow-up Schedule Surgery Office Follow-up Status: Hold For - Appointment  Requested for: 27Jul2016  Discussion/Summary   She is scheduled for Interstim on Tuesday. Rare risk of ineffectiveness is discussed _____ of positive PND.  After a thorough review of the management options for the patient's condition the patient  elected to proceed with surgical therapy as noted above. We have discussed the potential benefits and risks of the procedure, side effects of the proposed treatment, the likelihood of the patient achieving the goals of the procedure, and any potential problems that might occur during the procedure or recuperation. Informed consent has been obtained.

## 2015-02-27 ENCOUNTER — Ambulatory Visit (INDEPENDENT_AMBULATORY_CARE_PROVIDER_SITE_OTHER): Payer: Medicare HMO | Admitting: Internal Medicine

## 2015-02-27 VITALS — BP 127/87 | HR 95 | Temp 98.4°F | Ht 66.0 in | Wt 207.6 lb

## 2015-02-27 DIAGNOSIS — Z13820 Encounter for screening for osteoporosis: Secondary | ICD-10-CM | POA: Diagnosis not present

## 2015-02-27 DIAGNOSIS — Z0000 Encounter for general adult medical examination without abnormal findings: Secondary | ICD-10-CM

## 2015-02-27 DIAGNOSIS — E119 Type 2 diabetes mellitus without complications: Secondary | ICD-10-CM | POA: Diagnosis not present

## 2015-02-27 DIAGNOSIS — E039 Hypothyroidism, unspecified: Secondary | ICD-10-CM

## 2015-02-27 DIAGNOSIS — Z1239 Encounter for other screening for malignant neoplasm of breast: Secondary | ICD-10-CM

## 2015-02-27 DIAGNOSIS — E7800 Pure hypercholesterolemia, unspecified: Secondary | ICD-10-CM

## 2015-02-27 DIAGNOSIS — I10 Essential (primary) hypertension: Secondary | ICD-10-CM

## 2015-02-27 LAB — GLUCOSE, CAPILLARY: Glucose-Capillary: 98 mg/dL (ref 65–99)

## 2015-02-27 LAB — POCT GLYCOSYLATED HEMOGLOBIN (HGB A1C): Hemoglobin A1C: 5.8

## 2015-02-27 MED ORDER — PRAVASTATIN SODIUM 20 MG PO TABS: ORAL_TABLET | ORAL | Status: DC

## 2015-02-27 NOTE — Patient Instructions (Signed)
Ms. Michna it was nice meeting you today. I have ordered some labs and you will be informed when the results come back. Please follow up with Dr. Evette Doffing in 4 weeks.

## 2015-02-27 NOTE — Progress Notes (Signed)
Medicine attending: I personally interviewed and briefly examined this patient, and reviewed pertinent clinical laboratory  data  with resident physician Dr.Vasundrha Rathore and we discussed a   management plan.

## 2015-02-28 LAB — T4, FREE: Free T4: 1.05 ng/dL (ref 0.82–1.77)

## 2015-02-28 LAB — TSH: TSH: 2.53 u[IU]/mL (ref 0.450–4.500)

## 2015-02-28 NOTE — Assessment & Plan Note (Signed)
DEXA scan and mammo ordered today.

## 2015-02-28 NOTE — Progress Notes (Signed)
Patient ID: [name] [name], female   DOB: [birth year], 69 y.o.   MRN: [id number]   Subjective:   Patient ID: [name] [name] female   DOB: [birth year] 69 y.o.   MRN: [id number]  HPI: [name] [name] is a 69 y.o. F with a PMHx of hypothyroidism resulting from thyroidectomy, anxiety, HTN, diet controlled DM2, and GERD presenting to the clinic today for a routine followup. Today patient wanted to know if her thyroid medication was the correct dose because she has been experiencing excessive sweating of her face only, weight gain, and thinning of hair strands. Denies any skin changes or sluggishness. No other complaints.     Past Medical History  Diagnosis Date  . Anxiety   . GERD (gastroesophageal reflux disease)   . HTN (hypertension)   . Depression   . Fibromyalgia   . Hemorrhoids   . Migraine headache   . Cervical radiculopathy   . Rotator cuff syndrome of right shoulder   . Chronic neck pain   . Chronic back pain     R > L  . Lumbar radicular pain     R>L legs  . History of TIA (transient ischemic attack)     02-27-2009  no residual  . Hyperlipidemia   . History of hiatal hernia   . Dysphagia     functional --  takes small bites  . Urge urinary incontinence     refractory  . PONV (postoperative nausea and vomiting)   . Mild intermittent asthma with allergic rhinitis without complication   . Hypothyroidism, postsurgical   . History of diabetes mellitus, type II     was diet controlled -- per pt pcp she was no longer diabetic  . History of benign thyroid tumor   . Wears glasses    Current Outpatient Prescriptions  Medication Sig Dispense Refill  . albuterol (PROAIR HFA) 108 (90 BASE) MCG/ACT inhaler Inhale 2 puffs into the lungs every 6 (six) hours as needed for wheezing or shortness of breath. 1 Inhaler 3  . cholecalciferol (VITAMIN D) 400 UNITS TABS tablet Take 800 Units by mouth daily.     . clonazePAM (KLONOPIN) 1 MG tablet TAKE 1 TABLET BY MOUTH EVERY NIGHT AT BEDTIME AS  NEEDED FOR ANXIETY 30 tablet 2  . diclofenac sodium (VOLTAREN) 1 % GEL Apply 4 g topically daily as needed. As needed for pain. 5 Tube 5  . gabapentin (NEURONTIN) 300 MG capsule TAKE ONE CAPSULE BY MOUTH THREE TIMES DAILY 90 capsule 3  . HYDROcodone-acetaminophen (NORCO/VICODIN) 5-325 MG per tablet Take 1 tablet by mouth every 8 (eight) hours as needed for moderate pain. 60 tablet 0  . HYDROcodone-acetaminophen (NORCO/VICODIN) 5-325 MG per tablet Take 1-2 tablets by mouth every 6 (six) hours as needed. 25 tablet 0  . hyoscyamine (HYOMAX-SL) 0.125 MG SL tablet Dissolve 1 to 2 tablets under the tongue every 4 (four) hours as needed for chest pain. 30 tablet 3  . levothyroxine (SYNTHROID, LEVOTHROID) 100 MCG tablet TAKE 1 TABLET(100 MCG) BY MOUTH DAILY BEFORE BREAKFAST 30 tablet 5  . losartan (COZAAR) 25 MG tablet TAKE 2 TABLETS (50 MG TOTAL) BY MOUTH DAILY. 180 tablet 1  . omeprazole (PRILOSEC) 40 MG capsule Take 1 capsule (40 mg total) by mouth 2 (two) times daily. 180 capsule 3  . pravastatin (PRAVACHOL) 20 MG tablet TAKE 1 TABLET (20 MG TOTAL) BY MOUTH DAILY. 90 tablet 1  . tiZANidine (ZANAFLEX) 2 MG tablet TAKE 1/2 TO 1 TABLET BY MOUTH  EVERY 8 HOURS AS NEEDED FOR MUSCLE SPASMS 30 tablet 3  . trimethoprim (TRIMPEX) 100 MG tablet Take 100 mg by mouth every morning.     . venlafaxine (EFFEXOR) 100 MG tablet Take 1 tablet (100 mg total) by mouth 3 (three) times daily. 90 tablet 5   No current facility-administered medications for this visit.   Family History  Problem Relation Age of Onset  . Cancer      breast -- grandmother  . Cancer      lung -- uncles (3)  . Leukemia Sister   . Diabetes Father   . Diabetes Mother   . Coronary artery disease Father    Social History   Social History  . Marital Status: Married    Spouse Name: N/A  . Number of Children: N/A  . Years of Education: N/A   Social History Main Topics  . Smoking status: Never Smoker   . Smokeless tobacco: Never Used  .  Alcohol Use: No  . Drug Use: No  . Sexual Activity: Not on file   Other Topics Concern  . Not on file   Social History Narrative   Review of Systems: Review of Systems  Constitutional: Negative for fever, chills and weight loss.       Weight gain  HENT: Negative for ear pain.   Eyes: Negative for blurred vision and pain.  Respiratory: Negative for cough, shortness of breath and wheezing.   Cardiovascular: Negative for chest pain, palpitations and leg swelling.  Gastrointestinal: Negative for nausea, vomiting, abdominal pain, diarrhea and constipation.  Genitourinary: Negative for dysuria, urgency and frequency.  Skin: Negative for itching and rash.  Neurological: Negative for headaches.     Objective:  Physical Exam: Filed Vitals:   02/27/15 1042  BP: 127/87  Pulse: 95  Temp: 98.4 F (36.9 C)  TempSrc: Oral  Height: 5\' 6"  (1.676 m)  Weight: 207 lb 9.6 oz (94.167 kg)  SpO2: 98%   Physical Exam  Constitutional: She is oriented to person, place, and time. She appears well-developed and well-nourished. No distress.  HENT:  Head: Normocephalic and atraumatic.  Eyes: EOM are normal. Pupils are equal, round, and reactive to light.  Neck: Neck supple. No tracheal deviation present.  Missing thyroid gland   Cardiovascular: Normal rate, regular rhythm and intact distal pulses.   Pulmonary/Chest: Effort normal. No respiratory distress. She has no wheezes. She has no rales.  Abdominal: Soft. Bowel sounds are normal. She exhibits no distension. There is no tenderness.  Musculoskeletal: Normal range of motion. She exhibits no edema.  Neurological: She is alert and oriented to person, place, and time.  Skin: Skin is warm and dry. No rash noted. No erythema.  Psychiatric: She has a normal mood and affect.    Assessment & Plan:

## 2015-02-28 NOTE — Assessment & Plan Note (Signed)
Pravastatin 20 mg daily refilled today.

## 2015-02-28 NOTE — Assessment & Plan Note (Addendum)
Patient experiencing excessive sweating of face, thinning of hair strands, and weight gain. Last TSH and free T4 in 10/2014 were normal. She is currently on Levothyroxine 100 mcg daily. -TSH and free T4 ordered today. Change medication dose based on the results. -If the results come back normal, consider other causes for her symptoms.  Addendum: TSH and free T4 normal. Continue current mgmt and reassess symptoms at next visit.

## 2015-02-28 NOTE — Assessment & Plan Note (Signed)
Blood pressure at the last visit in March 2016 was 141/76, today it is 127/87. She is currently taking Losartan 50 mg daily. -Her blood pressure seems to be well controlled, continue current management.

## 2015-02-28 NOTE — Assessment & Plan Note (Signed)
Hemoglobin A1c 5.8 and CABG 98 today. She is currently not on any medications and her diabetes seems to be well-controlled on dietary and lifestyle management. -Repeat A1c in 3 months

## 2015-03-02 NOTE — Progress Notes (Signed)
Medicine attending: I personally interviewed and briefly examined this patient, and reviewed pertinent clinical laboratory and radiographic data  with resident physician Dr.Vasundrha Rathore on the day of the patient visit and we discussed a   management plan.

## 2015-03-12 ENCOUNTER — Telehealth: Payer: Self-pay | Admitting: *Deleted

## 2015-03-12 NOTE — Telephone Encounter (Signed)
I spoke with [name] [name] about cancelling her September appointment and calling for her prescription when she needs refill.  We will reschedule her for October (Dr Naaman Plummer note said return in 3 months at July appt).

## 2015-03-23 ENCOUNTER — Encounter: Payer: Self-pay | Admitting: Student in an Organized Health Care Education/Training Program

## 2015-03-23 ENCOUNTER — Ambulatory Visit: Payer: Medicare HMO | Admitting: Student in an Organized Health Care Education/Training Program

## 2015-03-23 VITALS — BP 151/76 | HR 85 | Temp 98.6°F

## 2015-03-23 DIAGNOSIS — I10 Essential (primary) hypertension: Secondary | ICD-10-CM | POA: Diagnosis not present

## 2015-03-23 DIAGNOSIS — H5310 Unspecified subjective visual disturbances: Secondary | ICD-10-CM | POA: Diagnosis not present

## 2015-03-23 DIAGNOSIS — R296 Repeated falls: Secondary | ICD-10-CM

## 2015-03-23 DIAGNOSIS — Z9181 History of falling: Secondary | ICD-10-CM | POA: Insufficient documentation

## 2015-03-23 DIAGNOSIS — Z0000 Encounter for general adult medical examination without abnormal findings: Secondary | ICD-10-CM

## 2015-03-23 DIAGNOSIS — E7800 Pure hypercholesterolemia, unspecified: Secondary | ICD-10-CM

## 2015-03-23 DIAGNOSIS — Z23 Encounter for immunization: Secondary | ICD-10-CM | POA: Diagnosis not present

## 2015-03-23 MED ORDER — ASPIRIN EC 81 MG PO TBEC: 81.0000 mg | DELAYED_RELEASE_TABLET | Freq: Every day | ORAL | Status: AC

## 2015-03-23 MED ORDER — LOSARTAN POTASSIUM 100 MG PO TABS: 100.0000 mg | ORAL_TABLET | Freq: Every day | ORAL | Status: DC

## 2015-03-23 MED ORDER — PRAVASTATIN SODIUM 20 MG PO TABS: ORAL_TABLET | ORAL | Status: DC

## 2015-03-23 NOTE — Assessment & Plan Note (Signed)
Blood pressure is above goal today in clinic and with ambulatory readings. Plan to increase losartan from 50 mg daily to 100 mg daily. Follow-up blood pressure check in 2 months, will check sodium, potassium, and creatinine at that visit.

## 2015-03-23 NOTE — Assessment & Plan Note (Signed)
Patient reports having several falls in the last 1 month. She has been using a cane which purchased for herself since her left ankle fracture several years ago. It's been several years since last physical therapy course. She is on several centrally acting medications including hydrocodone and clonazepam, which I counseled her to talk to her pain physician about titrating down. I also offered to talk to her about insomnia at a dedicated visit and to try lifestyle modifications to down try to titrate clonazepam over the next few months. Referred her for another course of physical therapy for gait training and equipment assessment. Has not yet been assessed for osteoporosis, DEXA scan ordered August 2016 and not yet completed.

## 2015-03-23 NOTE — Progress Notes (Signed)
   See Encounters tab for problem-based medical decision making  __________________________________________________________  HPI:  69 year old woman comes into clinic today for follow-up of hypertension. Reports good compliance with losartan 50 mg daily at home. Has noticed high blood pressure readings from her wrist cuff with several systolics ranging 106-269. Says she often gets a headache in the evening and so has been taking an extra 25 mg tab of losartan in the evening as needed. Denies any chest pain or dyspnea on exertion. No fevers or chills. No other changes in her medications recently.   Patient has a history of diabetes recorded in her chart since 2009 when she visited a endocrinologist for polyuria. It seems that since that time her A1c's have always ranged under 6%. She was on metformin for a brief time but then discontinued. I think at this points she has made adequate lifestyle modifications that for the last several years her blood glucose has been within the normal range. I'm going to remove diabetes from her problem list as I don't think this is active anymore.  The patient's other complaint today is of sudden onset visual changes. She says that about a week ago she noticed that the peripheral vision out of her left eye suddenly became blurry. She try to clean her glasses and it didn't help. Denies any other pain or changes in her vision. This change in her vision resolved spontaneously after a few minutes. She does report a personal history of TIA. Denies any CVA in the past. She does suffer from chronic headaches, including migraines. Says that visual disturbances not usually part of her aura. She did not have a headache after these vision changes. Currently she says her vision is normal. She does report increasing headaches over the last few weeks that start in the back of her neck and radiate over her occiput. No nausea or vomiting. Eating and drinking well. Denies any weakness or  paresthesias.   __________________________________________________________  Problem List: Patient Active Problem List   Diagnosis Date Noted  . Risk for falls 03/23/2015  . Depression with anxiety 08/26/2014  . Lumbosacral spondylosis without myelopathy 08/20/2013  . Lumbar facet arthropathy 08/20/2013  . Tension headache 08/19/2013  . Healthcare maintenance 08/19/2013  . Greater trochanteric bursitis of left hip 02/12/2013  . Osteoarthritis of right hand 11/12/2012  . Biceps tendinitis on right 11/12/2012  . Cervical radiculopathy at C7 left 10/14/2011  . Rotator cuff syndrome 10/14/2011  . HYPERCHOLESTEROLEMIA 11/14/2007  . Migraine without aura 10/25/2006  . Fibromyalgia 10/25/2006  . Hypothyroidism 04/11/2006  . Essential hypertension 04/11/2006  . Gastroesophageal reflux disease 04/11/2006    Medications: Reconciled today in Epic __________________________________________________________  Physical Exam:  Vital Signs: Filed Vitals:   03/23/15 0920  BP: 151/76  Pulse: 85  Temp: 98.6 F (37 C)  TempSrc: Oral  SpO2: 100%    Gen: Well appearing, NAD ENT: OP clear without erythema or exudate.  Neck: No cervical LAD, No thyromegaly or nodules, No JVD. CV: RRR, no murmurs Pulm: Normal effort, CTA throughout, no wheezing Abd: Soft, NT, ND, normal BS.  Ext: Warm, no edema, normal joints Skin: No atypical appearing moles. No rashes Neuro: alert and oriented, CN normal, full strength in upper and lower extremities.

## 2015-03-23 NOTE — Assessment & Plan Note (Signed)
Unclear etiology of her transient vision disturbance. Could be ongoing TIAs. She reports being evaluated by ophthalmology in the past with no clear diagnosis. They asked her to follow-up with neurology which she has not yet done. In case these are TIAs, I advised her to take aspirin 81 mg daily and continue pravastatin 20 mg daily as she already is doing. She had an allergy to NSAIDs listed, but this was several years ago and she has tolerated low-dose aspirin well recently. I advised her to come into the hospital if she has further vision changes lasting greater than 24 hours, as we'll need an MRI of her brain to confirm if these are CVAs.

## 2015-03-30 ENCOUNTER — Ambulatory Visit: Payer: Medicare HMO | Admitting: Physical Medicine & Rehabilitation

## 2015-04-01 ENCOUNTER — Ambulatory Visit (HOSPITAL_COMMUNITY): Payer: Medicare HMO | Attending: Student in an Organized Health Care Education/Training Program

## 2015-04-08 ENCOUNTER — Other Ambulatory Visit: Payer: Self-pay

## 2015-04-08 DIAGNOSIS — Z1231 Encounter for screening mammogram for malignant neoplasm of breast: Secondary | ICD-10-CM

## 2015-04-09 ENCOUNTER — Other Ambulatory Visit: Payer: Self-pay | Admitting: Registered Nurse

## 2015-04-20 ENCOUNTER — Other Ambulatory Visit: Payer: Self-pay | Admitting: Registered Nurse

## 2015-04-22 ENCOUNTER — Encounter: Payer: Medicare HMO | Attending: Physical Medicine and Rehabilitation | Admitting: Physical Medicine & Rehabilitation

## 2015-04-22 ENCOUNTER — Encounter: Payer: Self-pay | Admitting: Physical Medicine & Rehabilitation

## 2015-04-22 VITALS — BP 159/88 | HR 80

## 2015-04-22 DIAGNOSIS — M19011 Primary osteoarthritis, right shoulder: Secondary | ICD-10-CM | POA: Diagnosis not present

## 2015-04-22 DIAGNOSIS — M7521 Bicipital tendinitis, right shoulder: Secondary | ICD-10-CM | POA: Diagnosis not present

## 2015-04-22 DIAGNOSIS — Z79899 Other long term (current) drug therapy: Secondary | ICD-10-CM | POA: Diagnosis not present

## 2015-04-22 DIAGNOSIS — G894 Chronic pain syndrome: Secondary | ICD-10-CM

## 2015-04-22 DIAGNOSIS — M797 Fibromyalgia: Secondary | ICD-10-CM | POA: Diagnosis not present

## 2015-04-22 DIAGNOSIS — Z5181 Encounter for therapeutic drug level monitoring: Secondary | ICD-10-CM | POA: Diagnosis not present

## 2015-04-22 DIAGNOSIS — F418 Other specified anxiety disorders: Secondary | ICD-10-CM

## 2015-04-22 MED ORDER — HYDROCODONE-ACETAMINOPHEN 5-325 MG PO TABS: 1.0000 | ORAL_TABLET | Freq: Three times a day (TID) | ORAL | Status: DC | PRN

## 2015-04-22 NOTE — Progress Notes (Signed)
Subjective:    Patient ID: [name] [name], female    DOB: 1946-02-21, 69 y.o.   MRN: [id number]  HPI   [name] [name] is here in regard to her chronic pain. She is having more pain in her neck since I last saw her. She noticed a "thump" in her neck when she bent over yesterday, and additionally, she's had decreased rom particularly when she's turning to the right. She worries that it might be her shoulder may be at the center of it. She also has had to jerk and pull some articles around the house which may have triggered. She's also under a lot of stress as usual. She has avoided picking up her 67 year old who weighs 52 lbs.      Pain Inventory Average Pain 7 Pain Right Now 9 My pain is constant, dull and aching  In the last 24 hours, has pain interfered with the following? General activity 7 Relation with others 7 Enjoyment of life 7 What TIME of day is your pain at its worst? night Sleep (in general) Fair  Pain is worse with: sitting and some activites Pain improves with: rest, medication and injections Relief from Meds: 5  Mobility Do you have any goals in this area?  yes  Function Do you have any goals in this area?  yes  Neuro/Psych bladder control problems weakness depression anxiety  Prior Studies Any changes since last visit?  no  Physicians involved in your care Any changes since last visit?  no   Family History  Problem Relation Age of Onset  . Cancer      breast -- grandmother  . Cancer      lung -- uncles (3)  . Leukemia Sister   . Diabetes Father   . Diabetes Mother   . Coronary artery disease Father    Social History   Social History  . Marital Status: Married    Spouse Name: N/A  . Number of Children: N/A  . Years of Education: N/A   Social History Main Topics  . Smoking status: Never Smoker   . Smokeless tobacco: Never Used  . Alcohol Use: No  . Drug Use: No  . Sexual Activity: Not Asked   Other Topics Concern  . None   Social History  Narrative   Past Surgical History  Procedure Laterality Date  . Wrist surgery Left 2000  . Orif ankle fracture  01/10/2012    Procedure: OPEN REDUCTION INTERNAL FIXATION (ORIF) ANKLE FRACTURE;  Surgeon: Sanjuana Kava, MD;  Location: [facility];  Service: Orthopedics;  Laterality: Left;  . Cardiac catheterization  01-17-2003  dr Darnell Level brodie    normal coronary arteries and LVF,  ef 60%  . Transthoracic echocardiogram  03-02-2009    normal echo,  ef 55-60%  . Negative sleep study  2000   per pt  . Total thyroidectomy  1985    benign tumor  . Excision morton's neuroma  2002    bilateral feet  . Vaginal hysterectomy  1984  . Laparoscopic cholecystectomy  2000  . Excision of breast biopsy Right 1990's    benign  . Interstim implant placement N/A 02/10/2015    Procedure: INTERSTIM IMPLANT FIRST STAGE;  Surgeon: Bjorn Loser, MD;  Location: [facility];  Service: Urology;  Laterality: N/A;  . Interstim implant placement N/A 02/10/2015    Procedure: INTERSTIM IMPLANT SECOND STAGE;  Surgeon: Bjorn Loser, MD;  Location: [facility];  Service: Urology;  Laterality:  N/A;   Past Medical History  Diagnosis Date  . Anxiety   . GERD (gastroesophageal reflux disease)   . HTN (hypertension)   . Depression   . Fibromyalgia   . Hemorrhoids   . Migraine headache   . Cervical radiculopathy   . Rotator cuff syndrome of right shoulder   . Chronic neck pain   . Chronic back pain     R > L  . Lumbar radicular pain     R>L legs  . History of TIA (transient ischemic attack)     02-27-2009  no residual  . Hyperlipidemia   . History of hiatal hernia   . Dysphagia     functional --  takes small bites  . Urge urinary incontinence     refractory  . PONV (postoperative nausea and vomiting)   . Mild intermittent asthma with allergic rhinitis without complication   . Hypothyroidism, postsurgical   . History of diabetes mellitus, type II     was diet controlled -- per pt  pcp she was no longer diabetic  . History of benign thyroid tumor   . Wears glasses   . History of ankle fracture 01/08/2012    Status post open reduction internal fixation of a left ankle trimalleolar fracture by Dr. Iona Hansen on 01/10/2012.     BP 159/88 mmHg  Pulse 80  SpO2 98%  Opioid Risk Score:   Fall Risk Score:  `1  Depression screen PHQ 2/9  Depression screen [facility] 2/9 03/23/2015 02/27/2015 10/28/2014 09/18/2014 07/24/2014 03/26/2014 02/21/2014  Decreased Interest 1 0 0 0 0 0 0  Down, Depressed, Hopeless 3 0 1 0 0 0 0  PHQ - 2 Score 4 0 1 0 0 0 0  Altered sleeping 0 - 3 - - - -  Tired, decreased energy 3 - 3 - - - -  Change in appetite 1 - 2 - - - -  Feeling bad or failure about yourself  0 - 0 - - - -  Trouble concentrating 0 - 0 - - - -  Moving slowly or fidgety/restless 0 - 0 - - - -  Suicidal thoughts 0 - 0 - - - -  PHQ-9 Score 8 - 9 - - - -     Review of Systems  Genitourinary:       Bladder control problems  Neurological: Positive for weakness and numbness.  Psychiatric/Behavioral: Positive for dysphoric mood. The patient is nervous/anxious.   All other systems reviewed and are negative.      Objective:   Physical Exam  Constitutional: She is oriented to person, place, and time. She appears well-developed and well-nourished.  HENT: vocal quality appears normal. No coughing.  Head: Normocephalic and atraumatic.  Eyes: Conjunctivae and EOM are normal. Pupils are equal, round, and reactive to light.  Neck: Normal range of motion.  Cardiovascular: Normal rate and regular rhythm.  Pulmonary/Chest: Effort normal.  Abdominal: Soft. Bowel sounds are normal.  Musculoskeletal:  Patient has right rotator cuff impingement signs which were positive bur lesser so. She has substantial taut bands in the trapezius in the superior area as well ad the mid belly on the right.  She was limited in ROM to with lateral bending and rotating of the head to the right.   Scapulae  are also still tight. Head forward posture.  Neurological: She is alert and oriented to person, place, and time. No weakness or obvious sensory changes are noted.    Assessment &  Plan:   ASSESSMENT:  1. History of fibromyalgia with myofascial pain. Both scapulae are tight.  2. Chronic low back pain with lumbar disc and facet disease.  3. Right otator cuff syndrome./right bicipital tendonitis  4. Anxiety and depression.  5. Cervical stenosis with left C7 radiculopathy  6. Hx of fall with left trimalleolar ankle fx.  7. Left greater troch bursitis  8. Left and right wrist pain, extensor tendonitis  9. Functional dysphagia improved with small bites/ plenty of fluid with meals    PLAN:  1. hydrocodone 5/325 for breakthrough pain.  2. Bladder stimulator trial per urology.  3. Continue with neurontin for her general FMS pain. Continue klonopin for sleep/ hs anxiety  4. Continue with Zanaflex for spasms and back pain.  5. Maintain effexor IR at 100mg  BID for now. Increase to TID if needed if foot pain persists.  6. After informed consent and preparation of the skin with isopropyl alcohol, I injected the right trapezius x3 (2 superiorly, 1 mid belly) with 2cc of 1% lidocaine. The patient tolerated well, and no complications were experienced. Post-injection instructions were provided. She was experiencing relief before she left the office.    7. Discussed appropriate posture and realistic activities. Provider her scapular ROM exercises as well as guidance for neck ROM. Use modailites as needed.    8. All questions were encouraged and answered. i will see her back in 2 months. 30 minutes were spent with the patient today.

## 2015-04-23 ENCOUNTER — Other Ambulatory Visit: Payer: Self-pay | Admitting: Physical Medicine & Rehabilitation

## 2015-04-23 NOTE — Addendum Note (Signed)
Addended by: Caro Hight on: 04/23/2015 02:15 PM   Modules accepted: Orders

## 2015-04-24 DIAGNOSIS — M799 Soft tissue disorder, unspecified: Secondary | ICD-10-CM | POA: Diagnosis not present

## 2015-04-24 LAB — PMP ALCOHOL METABOLITE (ETG): Ethyl Glucuronide (EtG): NEGATIVE ng/mL

## 2015-04-25 DIAGNOSIS — M1711 Unilateral primary osteoarthritis, right knee: Secondary | ICD-10-CM | POA: Diagnosis not present

## 2015-04-25 DIAGNOSIS — M13811 Other specified arthritis, right shoulder: Secondary | ICD-10-CM | POA: Diagnosis not present

## 2015-04-25 LAB — PRESCRIPTION MONITORING PROFILE (SOLSTAS)
AMPHETAMINE/METH: NEGATIVE ng/mL
BARBITURATE SCREEN, URINE: NEGATIVE ng/mL
Benzodiazepine Screen, Urine: NEGATIVE ng/mL
Buprenorphine, Urine: NEGATIVE ng/mL
CANNABINOID SCRN UR: NEGATIVE ng/mL
Carisoprodol, Urine: NEGATIVE ng/mL
Cocaine Metabolites: NEGATIVE ng/mL
Creatinine, Urine: 45.63 mg/dL (ref >20.0)
FENTANYL URINE: NEGATIVE ng/mL
MDMA URINE: NEGATIVE ng/mL
Meperidine, Ur: NEGATIVE ng/mL
Methadone Screen, Urine: NEGATIVE ng/mL
NITRITES URINE, INITIAL: NEGATIVE ug/mL
Opiate Screen, Urine: NEGATIVE ng/mL
Oxycodone Screen, Ur: NEGATIVE ng/mL
PROPOXYPHENE: NEGATIVE ng/mL
TAPENTADOLUR: NEGATIVE ng/mL
TRAMADOL UR: NEGATIVE ng/mL
ZOLPIDEM, URINE: NEGATIVE ng/mL
pH, Initial: 8.2 pH (ref 4.5–8.9)

## 2015-05-01 DIAGNOSIS — M539 Dorsopathy, unspecified: Secondary | ICD-10-CM | POA: Diagnosis not present

## 2015-05-01 DIAGNOSIS — M545 Low back pain: Secondary | ICD-10-CM | POA: Diagnosis not present

## 2015-05-15 ENCOUNTER — Ambulatory Visit
Admission: RE | Admit: 2015-05-15 | Discharge: 2015-05-15 | Disposition: A | Discharge status: Home / Self Care | Payer: Medicare HMO | Source: Ambulatory Visit

## 2015-05-15 DIAGNOSIS — Z1231 Encounter for screening mammogram for malignant neoplasm of breast: Secondary | ICD-10-CM | POA: Diagnosis not present

## 2015-05-20 NOTE — Progress Notes (Signed)
Urine drug screen for this encounter is Inconsistent for prescribed medication. Patient reported last dosage of medication on 04/21/2015 but, no metabolites found in urine. Will repeat UDS on 06/2015 visit per Dr. Naaman Plummer.

## 2015-05-26 ENCOUNTER — Encounter: Payer: Self-pay | Admitting: Student

## 2015-06-15 ENCOUNTER — Ambulatory Visit (INDEPENDENT_AMBULATORY_CARE_PROVIDER_SITE_OTHER): Payer: Medicare HMO | Admitting: Student in an Organized Health Care Education/Training Program

## 2015-06-15 ENCOUNTER — Ambulatory Visit (HOSPITAL_COMMUNITY)
Admission: RE | Admit: 2015-06-15 | Discharge: 2015-06-15 | Disposition: A | Discharge status: Home / Self Care | Payer: Medicare HMO | Source: Ambulatory Visit | Attending: Student in an Organized Health Care Education/Training Program | Admitting: Student in an Organized Health Care Education/Training Program

## 2015-06-15 ENCOUNTER — Encounter: Payer: Self-pay | Admitting: Student in an Organized Health Care Education/Training Program

## 2015-06-15 VITALS — BP 141/77 | HR 74 | Temp 98.2°F | Ht 66.0 in | Wt 208.3 lb

## 2015-06-15 DIAGNOSIS — R002 Palpitations: Secondary | ICD-10-CM | POA: Insufficient documentation

## 2015-06-15 DIAGNOSIS — R2681 Unsteadiness on feet: Secondary | ICD-10-CM | POA: Diagnosis not present

## 2015-06-15 DIAGNOSIS — E890 Postprocedural hypothyroidism: Secondary | ICD-10-CM

## 2015-06-15 DIAGNOSIS — Z79899 Other long term (current) drug therapy: Secondary | ICD-10-CM

## 2015-06-15 DIAGNOSIS — I10 Essential (primary) hypertension: Secondary | ICD-10-CM

## 2015-06-15 DIAGNOSIS — E039 Hypothyroidism, unspecified: Secondary | ICD-10-CM

## 2015-06-15 DIAGNOSIS — Z9181 History of falling: Secondary | ICD-10-CM

## 2015-06-15 DIAGNOSIS — M797 Fibromyalgia: Secondary | ICD-10-CM

## 2015-06-15 NOTE — Assessment & Plan Note (Signed)
Blood pressure is moderately well controlled on recheck today. Plan to continue losartan 100 mg once daily.

## 2015-06-15 NOTE — Progress Notes (Signed)
   See Encounters tab for problem-based medical decision making  __________________________________________________________  HPI:  69 year old woman with fibromyalgia here for evaluation of new onset palpitations. Reports waking up from sleep with a sudden sensation of panic and racing heart rate. She sits on the side of the bed and it resolved spontaneously after several minutes. Denies feeling palpitations other times during the day. No lightheadedness when standing. Denies any chest pain or dyspnea on exertion. Has good exertional capacity. No other recent changes in her medications. She lives at home with her husband and reports good control over her mood and other relationships. She follows with Dr. Eda Keys with PMR for fibromyalgia with good effect of her current medication regimen. Reports good compliance with taking Synthroid every day on an empty stomach. No other fevers or chills. Eating and drinking well. She has chronic headaches for which she sees neurology, they seem to be mixed of tension and migraine.   __________________________________________________________  Problem List: Patient Active Problem List   Diagnosis Date Noted  . Palpitations 06/15/2015  . Risk for falls 03/23/2015  . Depression with anxiety 08/26/2014  . Lumbosacral spondylosis without myelopathy 08/20/2013  . Lumbar facet arthropathy 08/20/2013  . Tension headache 08/19/2013  . Healthcare maintenance 08/19/2013  . Greater trochanteric bursitis of left hip 02/12/2013  . Osteoarthritis of right hand 11/12/2012  . Biceps tendinitis on right 11/12/2012  . Cervical radiculopathy at C7 left 10/14/2011  . Rotator cuff syndrome 10/14/2011  . HYPERCHOLESTEROLEMIA 11/14/2007  . Migraine without aura 10/25/2006  . Fibromyalgia 10/25/2006  . Hypothyroidism 04/11/2006  . Essential hypertension 04/11/2006  . Gastroesophageal reflux disease 04/11/2006    Medications: Reconciled today in  Epic __________________________________________________________  Physical Exam:  Vital Signs: Filed Vitals:   06/15/15 1431  BP: 141/77  Pulse: 74  Temp: 98.2 F (36.8 C)  TempSrc: Oral  Height: 5\' 6"  (1.676 m)  Weight: 208 lb 4.8 oz (94.484 kg)  SpO2: 97%    Gen: Well appearing, NAD Neck: No cervical LAD, No thyromegaly or nodules, No JVD. CV: RRR, no murmurs Pulm: Normal effort, CTA throughout, no wheezing Abd: Soft, NT, ND, normal BS.  Ext: Warm, no edema, normal joints Skin: No atypical appearing moles. No rashes   ECG: I reviewed her ECG in clinic. It is normal sinus rhythm with a very mild first-degree AV block. Normal axis. Normal waveforms with no Q waves or T-wave inversions.

## 2015-06-15 NOTE — Assessment & Plan Note (Signed)
Unclear etiology for her symptoms of nocturnal palpitations. ECG is largely reassuring with no signs of prior ischemia. Could be a symptom of her hypothyroidism with full dose replacement so we'll check a TSH today. For now to monitor this, if she has persistent symptoms I would like to perform an ambulatory cardiac monitor.

## 2015-06-15 NOTE — Assessment & Plan Note (Signed)
She continues to have an unsteady gait and worry about falling. I think she remains high risk for falls especially with polypharmacy stemming from fibromyalgia. I prescribed physical therapy course at her last visit but she was unable to afford the $45 co-pay for those visits. I'm going to reorder a bone density scan to screen for osteoporosis, will treat with bisphosphonates if it is abnormal. I recommended against other durable medical equipment without an evaluation from physical therapy. If her fall risk increases in the future, we'll have to revisit pain and options for PT eval.

## 2015-06-15 NOTE — Assessment & Plan Note (Signed)
Patient had a thyroidectomy in the 1980s and is now on full dose replacements with levothyroxine. We last checked a TSH and it was well controlled about 6 months ago, however with her symptoms of palpitations we'll check another TSH today. Continue levothyroxine at 100 g daily for now, she seems to have good compliance with dosing schedule.

## 2015-06-16 ENCOUNTER — Encounter: Payer: Self-pay | Admitting: Student in an Organized Health Care Education/Training Program

## 2015-06-16 LAB — BMP8+ANION GAP
ANION GAP: 17 mmol/L (ref 10.0–18.0)
BUN / CREAT RATIO: 15 (ref 11–26)
BUN: 15 mg/dL (ref 8–27)
CHLORIDE: 101 mmol/L (ref 97–106)
CO2: 23 mmol/L (ref 18–29)
CREATININE: 1.02 mg/dL — AB (ref 0.57–1.00)
Calcium: 9.3 mg/dL (ref 8.7–10.3)
GFR calc Af Amer: 66 mL/min/{1.73_m2} (ref >59)
GFR calc non Af Amer: 57 mL/min/{1.73_m2} — ABNORMAL LOW (ref >59)
GLUCOSE: 95 mg/dL (ref 65–99)
POTASSIUM: 4.6 mmol/L (ref 3.5–5.2)
SODIUM: 141 mmol/L (ref 136–144)

## 2015-06-16 LAB — TSH: TSH: 2.68 u[IU]/mL (ref 0.450–4.500)

## 2015-06-22 ENCOUNTER — Encounter: Payer: Medicare HMO | Attending: Physical Medicine and Rehabilitation | Admitting: Physical Medicine & Rehabilitation

## 2015-06-22 ENCOUNTER — Encounter: Payer: Self-pay | Admitting: Physical Medicine & Rehabilitation

## 2015-06-22 VITALS — BP 147/79 | HR 92 | Resp 14

## 2015-06-22 DIAGNOSIS — M797 Fibromyalgia: Secondary | ICD-10-CM | POA: Diagnosis not present

## 2015-06-22 DIAGNOSIS — Z5181 Encounter for therapeutic drug level monitoring: Secondary | ICD-10-CM | POA: Insufficient documentation

## 2015-06-22 DIAGNOSIS — Z79899 Other long term (current) drug therapy: Secondary | ICD-10-CM | POA: Insufficient documentation

## 2015-06-22 DIAGNOSIS — G894 Chronic pain syndrome: Secondary | ICD-10-CM

## 2015-06-22 DIAGNOSIS — M19011 Primary osteoarthritis, right shoulder: Secondary | ICD-10-CM | POA: Insufficient documentation

## 2015-06-22 DIAGNOSIS — F418 Other specified anxiety disorders: Secondary | ICD-10-CM

## 2015-06-22 MED ORDER — HYDROCODONE-ACETAMINOPHEN 5-325 MG PO TABS: 1.0000 | ORAL_TABLET | Freq: Three times a day (TID) | ORAL | Status: DC | PRN

## 2015-06-22 NOTE — Patient Instructions (Signed)
PLEASE CALL ME WITH ANY PROBLEMS OR QUESTIONS (#[phone]). HAVE A HAPPY HOLIDAY SEASON!!!    

## 2015-06-22 NOTE — Progress Notes (Signed)
Subjective:    Patient ID: [name] [name], female    DOB: 1945/09/21, 69 y.o.   MRN: [id number]  HPI  Shela Leff is here in follow up of her chronic pain. She is still feeling the "crunchies" in her neck and has neck pain on the right side although her ROM and general neck pain improved quite a bit after the TPI's. She is doing stretches every day but is not always consistent. She also forgets to stretch before it's too late when she exercises or performs housework.  She is using the hydrocodone to help with her breakthrough pain. She was negative for hydorcodone on her last UDS but may have not taken the med for a couple days prior to the sample and forgot to tell the RN.  Pain Inventory Average Pain 8 Pain Right Now 7 My pain is constant and sharp  In the last 24 hours, has pain interfered with the following? General activity 8 Relation with others 8 Enjoyment of life 8 What TIME of day is your pain at its worst? daytime, night  Sleep (in general) Fair  Pain is worse with: walking, bending and some activites Pain improves with: rest, heat/ice, therapy/exercise, pacing activities, medication and injections Relief from Meds: 7  Mobility walk without assistance how many minutes can you walk? 30 Do you have any goals in this area?  yes  Function retired  Neuro/Psych bladder control problems weakness numbness depression anxiety  Prior Studies Any changes since last visit?  no  Physicians involved in your care Any changes since last visit?  yes Primary care Dr. Evette Doffing   Family History  Problem Relation Age of Onset  . Cancer      breast -- grandmother  . Cancer      lung -- uncles (3)  . Leukemia Sister   . Diabetes Father   . Diabetes Mother   . Coronary artery disease Father    Social History   Social History  . Marital Status: Married    Spouse Name: N/A  . Number of Children: N/A  . Years of Education: N/A   Social History Main Topics  . Smoking  status: Never Smoker   . Smokeless tobacco: Never Used  . Alcohol Use: No  . Drug Use: No  . Sexual Activity: Not Asked   Other Topics Concern  . None   Social History Narrative   Past Surgical History  Procedure Laterality Date  . Wrist surgery Left 2000  . Orif ankle fracture  01/10/2012    Procedure: OPEN REDUCTION INTERNAL FIXATION (ORIF) ANKLE FRACTURE;  Surgeon: Sanjuana Kava, MD;  Location: [facility];  Service: Orthopedics;  Laterality: Left;  . Cardiac catheterization  01-17-2003  dr Darnell Level brodie    normal coronary arteries and LVF,  ef 60%  . Transthoracic echocardiogram  03-02-2009    normal echo,  ef 55-60%  . Negative sleep study  2000   per pt  . Total thyroidectomy  1985    benign tumor  . Excision morton's neuroma  2002    bilateral feet  . Vaginal hysterectomy  1984  . Laparoscopic cholecystectomy  2000  . Excision of breast biopsy Right 1990's    benign  . Interstim implant placement N/A 02/10/2015    Procedure: INTERSTIM IMPLANT FIRST STAGE;  Surgeon: Bjorn Loser, MD;  Location: [facility];  Service: Urology;  Laterality: N/A;  . Interstim implant placement N/A 02/10/2015    Procedure: INTERSTIM IMPLANT SECOND STAGE;  Surgeon: Bjorn Loser, MD;  Location: [facility];  Service: Urology;  Laterality: N/A;   Past Medical History  Diagnosis Date  . Anxiety   . GERD (gastroesophageal reflux disease)   . HTN (hypertension)   . Depression   . Fibromyalgia   . Hemorrhoids   . Migraine headache   . Cervical radiculopathy   . Rotator cuff syndrome of right shoulder   . Chronic neck pain   . Chronic back pain     R > L  . Lumbar radicular pain     R>L legs  . History of TIA (transient ischemic attack)     02-27-2009  no residual  . Hyperlipidemia   . History of hiatal hernia   . Dysphagia     functional --  takes small bites  . Urge urinary incontinence     refractory  . PONV (postoperative nausea and vomiting)   . Mild  intermittent asthma with allergic rhinitis without complication   . Hypothyroidism, postsurgical   . History of diabetes mellitus, type II     was diet controlled -- per pt pcp she was no longer diabetic  . History of benign thyroid tumor   . Wears glasses   . History of ankle fracture 01/08/2012    Status post open reduction internal fixation of a left ankle trimalleolar fracture by Dr. Iona Hansen on 01/10/2012.     BP 147/79 mmHg  Pulse 92  Resp 14  SpO2 95%  Opioid Risk Score:   Fall Risk Score:  `1  Depression screen PHQ 2/9  Depression screen [facility] 2/9 06/15/2015 03/23/2015 02/27/2015 10/28/2014 09/18/2014 07/24/2014 03/26/2014  Decreased Interest 0 1 0 0 0 0 0  Down, Depressed, Hopeless 0 3 0 1 0 0 0  PHQ - 2 Score 0 4 0 1 0 0 0  Altered sleeping - 0 - 3 - - -  Tired, decreased energy - 3 - 3 - - -  Change in appetite - 1 - 2 - - -  Feeling bad or failure about yourself  - 0 - 0 - - -  Trouble concentrating - 0 - 0 - - -  Moving slowly or fidgety/restless - 0 - 0 - - -  Suicidal thoughts - 0 - 0 - - -  PHQ-9 Score - 8 - 9 - - -     Review of Systems  Genitourinary: Positive for dysuria.  Neurological: Positive for weakness and numbness.  Psychiatric/Behavioral: Positive for dysphoric mood. The patient is nervous/anxious.   All other systems reviewed and are negative.      Objective:   Physical Exam  Constitutional: She is oriented to person, place, and time. She appears well-developed and well-nourished.  HENT: vocal quality appears normal. No coughing.  Head: Normocephalic and atraumatic.  Eyes: Conjunctivae and EOM are normal. Pupils are equal, round, and reactive to light.  Neck: Normal range of motion.  Cardiovascular: Normal rate and regular rhythm.  Pulmonary/Chest: Effort normal.  Abdominal: Soft. Bowel sounds are normal.  Musculoskeletal:  Patient has right rotator cuff impingement signs which were positive    She is much better with movement of her head  to the right both with bending and rotation. She has tenderness still in the mid right trap and SCM but to a lesser extent.  Scapulae are also still somewhat tight. Head forward posture present with tight pecs too.  Neurological: She is alert and oriented to person, place, and time. No  weakness or obvious sensory changes are noted.   Assessment & Plan:   ASSESSMENT:  1. History of fibromyalgia with myofascial pain. Both scapulae are tight.  2. Chronic low back pain with lumbar disc and facet disease.  3. Right otator cuff syndrome./right bicipital tendonitis  4. Anxiety and depression.  5. Cervical spondylosis/stenosis with left C7 radiculopathy  6. Hx of fall with left trimalleolar ankle fx.  7. Left greater troch bursitis  8. Left and right wrist pain, extensor tendonitis  9. Functional dysphagia improved with small bites/fluid with meals    PLAN:  1. hydrocodone 5/325 for breakthrough pain. Discussed the purpose and methodology of the testing. Will cancel repeat UDS today. She has been consistent in the past 2. Bladder stimulator trial per urology.  3. Continue with neurontin for her general FMS pain. Continue klonopin for sleep/ hs anxiety  4. Continue with Zanaflex for spasms and back pain.  5. Maintain effexor IR at 100mg  BID for now. Increase to TID if needed if foot pain persists.  6. Discussed the effects of her cervical spine on her muscles of her shoulder girdle.  7. Reviewed shoulder and neck exercises as well as posture once again. It is imperative that she maintains a schedule and stays ahead of her pain particulalry when she's performing chores or exercising.  Could look at [facility] if symptoms progress 8. All questions were encouraged and answered. My NP will see her back in 2 months. 30 minutes were spent with the patient today.

## 2015-06-23 DIAGNOSIS — N3020 Other chronic cystitis without hematuria: Secondary | ICD-10-CM | POA: Diagnosis not present

## 2015-06-23 DIAGNOSIS — N3946 Mixed incontinence: Secondary | ICD-10-CM | POA: Diagnosis not present

## 2015-06-28 ENCOUNTER — Other Ambulatory Visit: Payer: Self-pay | Admitting: Registered Nurse

## 2015-07-16 ENCOUNTER — Other Ambulatory Visit: Payer: Self-pay | Admitting: Internal Medicine

## 2015-07-26 ENCOUNTER — Other Ambulatory Visit: Payer: Self-pay | Admitting: Registered Nurse

## 2015-08-16 ENCOUNTER — Other Ambulatory Visit: Payer: Self-pay | Admitting: Registered Nurse

## 2015-08-16 ENCOUNTER — Other Ambulatory Visit: Payer: Self-pay | Admitting: Physical Medicine & Rehabilitation

## 2015-08-18 DIAGNOSIS — S82852S Displaced trimalleolar fracture of left lower leg, sequela: Secondary | ICD-10-CM | POA: Diagnosis not present

## 2015-08-20 ENCOUNTER — Encounter: Payer: Medicare HMO | Attending: Physical Medicine and Rehabilitation | Admitting: Registered Nurse

## 2015-08-20 ENCOUNTER — Encounter: Payer: Self-pay | Admitting: Registered Nurse

## 2015-08-20 ENCOUNTER — Ambulatory Visit (HOSPITAL_COMMUNITY)
Admission: RE | Admit: 2015-08-20 | Discharge: 2015-08-20 | Disposition: A | Discharge status: Home / Self Care | Payer: Medicare HMO | Source: Ambulatory Visit | Attending: Registered Nurse | Admitting: Registered Nurse

## 2015-08-20 VITALS — BP 145/77 | HR 90

## 2015-08-20 DIAGNOSIS — M542 Cervicalgia: Secondary | ICD-10-CM | POA: Diagnosis not present

## 2015-08-20 DIAGNOSIS — F418 Other specified anxiety disorders: Secondary | ICD-10-CM

## 2015-08-20 DIAGNOSIS — M50323 Other cervical disc degeneration at C6-C7 level: Secondary | ICD-10-CM | POA: Insufficient documentation

## 2015-08-20 DIAGNOSIS — M50322 Other cervical disc degeneration at C5-C6 level: Secondary | ICD-10-CM | POA: Diagnosis not present

## 2015-08-20 DIAGNOSIS — Z79899 Other long term (current) drug therapy: Secondary | ICD-10-CM

## 2015-08-20 DIAGNOSIS — Z5181 Encounter for therapeutic drug level monitoring: Secondary | ICD-10-CM | POA: Diagnosis not present

## 2015-08-20 DIAGNOSIS — G894 Chronic pain syndrome: Secondary | ICD-10-CM | POA: Diagnosis not present

## 2015-08-20 DIAGNOSIS — M19011 Primary osteoarthritis, right shoulder: Secondary | ICD-10-CM | POA: Diagnosis not present

## 2015-08-20 DIAGNOSIS — M797 Fibromyalgia: Secondary | ICD-10-CM | POA: Diagnosis not present

## 2015-08-20 DIAGNOSIS — Z8781 Personal history of (healed) traumatic fracture: Secondary | ICD-10-CM | POA: Diagnosis not present

## 2015-08-20 MED ORDER — HYDROCODONE-ACETAMINOPHEN 5-325 MG PO TABS: 1.0000 | ORAL_TABLET | Freq: Three times a day (TID) | ORAL | Status: DC | PRN

## 2015-08-20 NOTE — Progress Notes (Signed)
Subjective:    Patient ID: [name] [name], female    DOB: 1946/01/20, 70 y.o.   MRN: [id number]  HPI: Ms. RIDHA CASSANO is a 70 year old female who returns for follow up for chronic pain and medication refill.She says her pain is located in her neck (mainly right side) radiating into her  right shoulder and left ankle.  Also states her neck pain is increasing in intensity will order x-ray, she verbalizes understanding. She rates her pain 7. Her current exercise regime is walking and performing stretching exercises. Also states she is scheduled for ankle surgery with Dr. Miachel Roux on 08/28/2015.  Pain Inventory Average Pain 9 Pain Right Now 7 My pain is constant, burning and aching  In the last 24 hours, has pain interfered with the following? General activity 4 Relation with others 7 Enjoyment of life 8 What TIME of day is your pain at its worst? morning daytime and night Sleep (in general) Fair  Pain is worse with: standing and some activites Pain improves with: rest, heat/ice, therapy/exercise, medication and injections Relief from Meds: 7  Mobility use a cane how many minutes can you walk? 20 ability to climb steps?  yes do you drive?  yes  Function retired I need assistance with the following:  meal prep, household duties and shopping  Neuro/Psych bladder control problems bowel control problems weakness numbness tremor trouble walking dizziness depression anxiety  Prior Studies Any changes since last visit?  no  Physicians involved in your care Any changes since last visit?  no   Family History  Problem Relation Age of Onset  . Cancer      breast -- grandmother  . Cancer      lung -- uncles (3)  . Leukemia Sister   . Diabetes Father   . Diabetes Mother   . Coronary artery disease Father    Social History   Social History  . Marital Status: Married    Spouse Name: N/A  . Number of Children: N/A  . Years of Education: N/A   Social History Main Topics    . Smoking status: Never Smoker   . Smokeless tobacco: Never Used  . Alcohol Use: No  . Drug Use: No  . Sexual Activity: Not Asked   Other Topics Concern  . None   Social History Narrative   Past Surgical History  Procedure Laterality Date  . Wrist surgery Left 2000  . Orif ankle fracture  01/10/2012    Procedure: OPEN REDUCTION INTERNAL FIXATION (ORIF) ANKLE FRACTURE;  Surgeon: Sanjuana Kava, MD;  Location: [facility];  Service: Orthopedics;  Laterality: Left;  . Cardiac catheterization  01-17-2003  dr Darnell Level brodie    normal coronary arteries and LVF,  ef 60%  . Transthoracic echocardiogram  03-02-2009    normal echo,  ef 55-60%  . Negative sleep study  2000   per pt  . Total thyroidectomy  1985    benign tumor  . Excision morton's neuroma  2002    bilateral feet  . Vaginal hysterectomy  1984  . Laparoscopic cholecystectomy  2000  . Excision of breast biopsy Right 1990's    benign  . Interstim implant placement N/A 02/10/2015    Procedure: INTERSTIM IMPLANT FIRST STAGE;  Surgeon: Bjorn Loser, MD;  Location: [facility];  Service: Urology;  Laterality: N/A;  . Interstim implant placement N/A 02/10/2015    Procedure: INTERSTIM IMPLANT SECOND STAGE;  Surgeon: Bjorn Loser, MD;  Location: [facility];  Service: Urology;  Laterality: N/A;   Past Medical History  Diagnosis Date  . Anxiety   . GERD (gastroesophageal reflux disease)   . HTN (hypertension)   . Depression   . Fibromyalgia   . Hemorrhoids   . Migraine headache   . Cervical radiculopathy   . Rotator cuff syndrome of right shoulder   . Chronic neck pain   . Chronic back pain     R > L  . Lumbar radicular pain     R>L legs  . History of TIA (transient ischemic attack)     02-27-2009  no residual  . Hyperlipidemia   . History of hiatal hernia   . Dysphagia     functional --  takes small bites  . Urge urinary incontinence     refractory  . PONV (postoperative nausea and  vomiting)   . Mild intermittent asthma with allergic rhinitis without complication   . Hypothyroidism, postsurgical   . History of diabetes mellitus, type II     was diet controlled -- per pt pcp she was no longer diabetic  . History of benign thyroid tumor   . Wears glasses   . History of ankle fracture 01/08/2012    Status post open reduction internal fixation of a left ankle trimalleolar fracture by Dr. Iona Hansen on 01/10/2012.     BP 145/77 mmHg  Pulse 90  SpO2 98%  Opioid Risk Score:   Fall Risk Score:  `1  Depression screen PHQ 2/9  Depression screen [facility] 2/9 08/20/2015 06/15/2015 03/23/2015 02/27/2015 10/28/2014 09/18/2014 07/24/2014  Decreased Interest 0 0 1 0 0 0 0  Down, Depressed, Hopeless 1 0 3 0 1 0 0  PHQ - 2 Score 1 0 4 0 1 0 0  Altered sleeping - - 0 - 3 - -  Tired, decreased energy - - 3 - 3 - -  Change in appetite - - 1 - 2 - -  Feeling bad or failure about yourself  - - 0 - 0 - -  Trouble concentrating - - 0 - 0 - -  Moving slowly or fidgety/restless - - 0 - 0 - -  Suicidal thoughts - - 0 - 0 - -  PHQ-9 Score - - 8 - 9 - -    Review of Systems  Constitutional: Positive for diaphoresis, appetite change and unexpected weight change.  Respiratory: Positive for shortness of breath and wheezing.   Cardiovascular: Positive for leg swelling.  Gastrointestinal: Positive for nausea and abdominal pain.  All other systems reviewed and are negative.      Objective:   Physical Exam  Constitutional: She is oriented to person, place, and time. She appears well-developed and well-nourished.  HENT:  Head: Normocephalic and atraumatic.  Neck: Normal range of motion. Neck supple.  Cervical Paraspinal Tenderness: C-5- C-6  Cardiovascular: Normal rate and regular rhythm.   Pulmonary/Chest: Effort normal and breath sounds normal.  Musculoskeletal:  Normal Muscle Bulk and Muscle Testing Reveals: Upper Extremities: Full ROM and Muscle Strength 5/5 Right AC Joint  Tenderness Lower Extremities: Full ROM and Muscle Strength 5/5 Arises from chair with ease Narrow Based gait  Neurological: She is alert and oriented to person, place, and time.  Skin: Skin is warm and dry.  Psychiatric: She has a normal mood and affect.  Nursing note and vitals reviewed.         Assessment & Plan:  1.History of fibromyalgia. Continue Current Medication Regime and  Exercise Regime. Continue Gabapentin 2. Chronic low back pain. Myofascial pain.:  Refilled: Hydrocodone 5 mg one tablet every 8 hours as needed for pain #60. Second script given for the following month. Continue Voltaren gel, Neurontin and Zanaflex  3. Rotator cuff syndrome./right bicipital tendonitis :No complaints today. Continue with heat and exercise as tolerated. 4. Neck  Pain: RX: X-ray 5. Anxiety and depression. Continue Effexor and Klonopin.  20 minutes of face to face patient care time was spent during this visit. All questions were encouraged and answered.   F/U in 2 month

## 2015-08-21 ENCOUNTER — Other Ambulatory Visit: Payer: Self-pay | Admitting: Registered Nurse

## 2015-08-21 DIAGNOSIS — I10 Essential (primary) hypertension: Secondary | ICD-10-CM | POA: Diagnosis not present

## 2015-08-21 DIAGNOSIS — E039 Hypothyroidism, unspecified: Secondary | ICD-10-CM | POA: Diagnosis not present

## 2015-08-21 DIAGNOSIS — R69 Illness, unspecified: Secondary | ICD-10-CM | POA: Diagnosis not present

## 2015-08-21 DIAGNOSIS — E785 Hyperlipidemia, unspecified: Secondary | ICD-10-CM | POA: Diagnosis not present

## 2015-08-24 ENCOUNTER — Ambulatory Visit: Payer: Medicare HMO | Admitting: Registered Nurse

## 2015-08-26 ENCOUNTER — Telehealth: Payer: Self-pay | Admitting: Student in an Organized Health Care Education/Training Program

## 2015-08-26 DIAGNOSIS — E2839 Other primary ovarian failure: Secondary | ICD-10-CM

## 2015-08-26 NOTE — Telephone Encounter (Signed)
Rec'd call from [place] stating the DX Code needs to be changed before tomorrow to Estrogen Deficiency in order for her to keep her appointment for tomorrow or they will have to cancel the patient's appointment.  Please advise

## 2015-08-27 ENCOUNTER — Ambulatory Visit
Admission: RE | Admit: 2015-08-27 | Discharge: 2015-08-27 | Disposition: A | Discharge status: Home / Self Care | Payer: Medicare HMO | Source: Ambulatory Visit | Attending: Student in an Organized Health Care Education/Training Program | Admitting: Student in an Organized Health Care Education/Training Program

## 2015-08-27 ENCOUNTER — Encounter: Payer: Self-pay | Admitting: Student in an Organized Health Care Education/Training Program

## 2015-08-27 ENCOUNTER — Other Ambulatory Visit: Payer: Medicare HMO

## 2015-08-27 DIAGNOSIS — M85852 Other specified disorders of bone density and structure, left thigh: Secondary | ICD-10-CM | POA: Diagnosis not present

## 2015-08-27 DIAGNOSIS — E2839 Other primary ovarian failure: Secondary | ICD-10-CM

## 2015-08-27 NOTE — Telephone Encounter (Signed)
New order placed.Despina Hidden Cassady2/16/20178:42 AM

## 2015-08-27 NOTE — Telephone Encounter (Signed)
Yes. Estrogen deficiency is an appropriate diagnostic code for her DEXA scan. I do not see any pended orders that I can change. Let them know that they can change it by a verbal order please.

## 2015-09-02 ENCOUNTER — Other Ambulatory Visit: Payer: Self-pay | Admitting: Physical Medicine & Rehabilitation

## 2015-09-14 ENCOUNTER — Other Ambulatory Visit: Payer: Self-pay | Admitting: Physical Medicine & Rehabilitation

## 2015-09-14 ENCOUNTER — Other Ambulatory Visit: Payer: Self-pay | Admitting: Registered Nurse

## 2015-10-11 ENCOUNTER — Other Ambulatory Visit: Payer: Self-pay | Admitting: Physical Medicine & Rehabilitation

## 2015-10-19 ENCOUNTER — Encounter: Payer: Medicare HMO | Attending: Physical Medicine and Rehabilitation | Admitting: Physical Medicine & Rehabilitation

## 2015-10-19 ENCOUNTER — Encounter: Payer: Self-pay | Admitting: Physical Medicine & Rehabilitation

## 2015-10-19 VITALS — BP 142/83 | HR 84

## 2015-10-19 DIAGNOSIS — G894 Chronic pain syndrome: Secondary | ICD-10-CM

## 2015-10-19 DIAGNOSIS — Z5181 Encounter for therapeutic drug level monitoring: Secondary | ICD-10-CM | POA: Diagnosis not present

## 2015-10-19 DIAGNOSIS — M19011 Primary osteoarthritis, right shoulder: Secondary | ICD-10-CM | POA: Insufficient documentation

## 2015-10-19 DIAGNOSIS — M47816 Spondylosis without myelopathy or radiculopathy, lumbar region: Secondary | ICD-10-CM

## 2015-10-19 DIAGNOSIS — Z79899 Other long term (current) drug therapy: Secondary | ICD-10-CM

## 2015-10-19 DIAGNOSIS — F418 Other specified anxiety disorders: Secondary | ICD-10-CM

## 2015-10-19 DIAGNOSIS — M19041 Primary osteoarthritis, right hand: Secondary | ICD-10-CM

## 2015-10-19 DIAGNOSIS — M797 Fibromyalgia: Secondary | ICD-10-CM | POA: Diagnosis not present

## 2015-10-19 DIAGNOSIS — M5412 Radiculopathy, cervical region: Secondary | ICD-10-CM

## 2015-10-19 DIAGNOSIS — M47817 Spondylosis without myelopathy or radiculopathy, lumbosacral region: Secondary | ICD-10-CM

## 2015-10-19 MED ORDER — HYDROCODONE-ACETAMINOPHEN 5-325 MG PO TABS: 1.0000 | ORAL_TABLET | Freq: Three times a day (TID) | ORAL | Status: DC | PRN

## 2015-10-19 MED ORDER — CLONAZEPAM 1 MG PO TABS: ORAL_TABLET | ORAL | Status: DC

## 2015-10-19 NOTE — Progress Notes (Signed)
Subjective:    Patient ID: [name] [name], female    DOB: 1946/06/28, 70 y.o.   MRN: [id number]  HPI   [name] [name] is here in follow up of her chronic pain. She has pain in her typical areas. She has a "knot" in her right arm. She feels that her righ wrist is weak. She drops objects sometimes. She continues to sew quite regularly---perhaps a few hours every other day or so. Her right "side" pain is also problematic at times.   She is taking her hydrocodone for her breakthrough pain which is effective for her.   [name] asked if she could take more gabapentin.   She is happy with her bladder stimulator. It has made a big difference.      Pain Inventory Average Pain 5 Pain Right Now 4 My pain is dull, stabbing and aching  In the last 24 hours, has pain interfered with the following? General activity 5 Relation with others 5 Enjoyment of life 5 What TIME of day is your pain at its worst? evening and night Sleep (in general) Fair  Pain is worse with: bending, sitting and some activites Pain improves with: rest, heat/ice, medication and injections Relief from Meds: 7  Mobility Do you have any goals in this area?  yes  Function Do you have any goals in this area?  yes  Neuro/Psych weakness numbness dizziness depression anxiety  Prior Studies Any changes since last visit?  no  Physicians involved in your care Any changes since last visit?  no   Family History  Problem Relation Age of Onset  . Cancer      breast -- grandmother  . Cancer      lung -- uncles (3)  . Leukemia Sister   . Diabetes Father   . Diabetes Mother   . Coronary artery disease Father    Social History   Social History  . Marital Status: Married    Spouse Name: N/A  . Number of Children: N/A  . Years of Education: N/A   Social History Main Topics  . Smoking status: Never Smoker   . Smokeless tobacco: Never Used  . Alcohol Use: No  . Drug Use: No  . Sexual Activity: Not Asked   Other  Topics Concern  . None   Social History Narrative   Past Surgical History  Procedure Laterality Date  . Wrist surgery Left 2000  . Orif ankle fracture  01/10/2012    Procedure: OPEN REDUCTION INTERNAL FIXATION (ORIF) ANKLE FRACTURE;  Surgeon: Sanjuana Kava, MD;  Location: [facility];  Service: Orthopedics;  Laterality: Left;  . Cardiac catheterization  01-17-2003  dr Darnell Level brodie    normal coronary arteries and LVF,  ef 60%  . Transthoracic echocardiogram  03-02-2009    normal echo,  ef 55-60%  . Negative sleep study  2000   per pt  . Total thyroidectomy  1985    benign tumor  . Excision morton's neuroma  2002    bilateral feet  . Vaginal hysterectomy  1984  . Laparoscopic cholecystectomy  2000  . Excision of breast biopsy Right 1990's    benign  . Interstim implant placement N/A 02/10/2015    Procedure: INTERSTIM IMPLANT FIRST STAGE;  Surgeon: Bjorn Loser, MD;  Location[facility];  Service: Urology;  Laterality: N/A;  . Interstim implant placement N/A 02/10/2015    Procedure: INTERSTIM IMPLANT SECOND STAGE;  Surgeon: Bjorn Loser, MD;  Location: [facility];  Service:  Urology;  Laterality: N/A;   Past Medical History  Diagnosis Date  . Anxiety   . GERD (gastroesophageal reflux disease)   . HTN (hypertension)   . Depression   . Fibromyalgia   . Hemorrhoids   . Migraine headache   . Cervical radiculopathy   . Rotator cuff syndrome of right shoulder   . Chronic neck pain   . Chronic back pain     R > L  . Lumbar radicular pain     R>L legs  . History of TIA (transient ischemic attack)     02-27-2009  no residual  . Hyperlipidemia   . History of hiatal hernia   . Dysphagia     functional --  takes small bites  . Urge urinary incontinence     refractory  . PONV (postoperative nausea and vomiting)   . Mild intermittent asthma with allergic rhinitis without complication   . Hypothyroidism, postsurgical   . History of diabetes mellitus,  type II     was diet controlled -- per pt pcp she was no longer diabetic  . History of benign thyroid tumor   . Wears glasses   . History of ankle fracture 01/08/2012    Status post open reduction internal fixation of a left ankle trimalleolar fracture by Dr. Iona Hansen on 01/10/2012.     BP 142/83 mmHg  Pulse 84  SpO2 97%  Opioid Risk Score:   Fall Risk Score:  `1  Depression screen PHQ 2/9  Depression screen [facility] 2/9 10/19/2015 08/20/2015 06/15/2015 03/23/2015 02/27/2015 10/28/2014 09/18/2014  Decreased Interest 0 0 0 1 0 0 0  Down, Depressed, Hopeless 1 1 0 3 0 1 0  PHQ - 2 Score 1 1 0 4 0 1 0  Altered sleeping 2 - - 0 - 3 -  Tired, decreased energy 2 - - 3 - 3 -  Change in appetite 3 - - 1 - 2 -  Feeling bad or failure about yourself  0 - - 0 - 0 -  Trouble concentrating 0 - - 0 - 0 -  Moving slowly or fidgety/restless 3 - - 0 - 0 -  Suicidal thoughts 0 - - 0 - 0 -  PHQ-9 Score 11 - - 8 - 9 -     Review of Systems  All other systems reviewed and are negative.      Objective:   Physical Exam  Constitutional: She is oriented to person, place, and time. She appears well-developed and well-nourished.  HENT: vocal quality appears normal. No coughing.  Head: Normocephalic and atraumatic.  Eyes: Conjunctivae and EOM are normal. Pupils are equal, round, and reactive to light.  Neck: Normal range of motion.  Cardiovascular: Normal rate and regular rhythm.  Pulmonary/Chest: Effort normal.  Abdominal: Soft. Bowel sounds are normal.  Musculoskeletal:  Patient has right rotator cuff impingement signs which were positive She is much better with movement of her head to the right both with bending and rotation. She has tenderness still in the mid right trap with spasm, also in SCM but to a lesser extent.  Scapulae are a little tight. Head forward posture present with tight pecs too.  Neurological: She is alert and oriented to person, place, and time. No weakness or obvious sensory  changes are noted.    Assessment & Plan:   ASSESSMENT:  1. History of fibromyalgia with myofascial pain.    2. Chronic low back pain with lumbar disc and facet disease.  3. Right otator cuff syndrome./right bicipital tendonitis  4. Anxiety and depression.  5. Cervical spondylosis/stenosis with left C7 radiculopathy  6. Hx of fall with left trimalleolar ankle fx.  7. Left greater troch bursitis  8. Left and right wrist pain, extensor tendonitis  9. Functional dysphagia improved with small bites/fluid with meals     PLAN:  1. hydrocodone 5/325 for breakthrough pain.    2. Bladder stimulator trial per urology.  3. Continue with neurontin for her general FMS pain. Continue klonopin for sleep/ hs anxiety --90 day rx was written 4. Continue with Zanaflex for spasms and back pain.  5. Maintain effexor IR at 100mg  TID.  6. Discussed the effects of her cervical spine on her muscles of her shoulder girdle.  7. Reviewed shoulder and neck exercises as well as posture once again. xrays also personally reviewed.  ESI if indicated 8. After informed consent and preparation of the skin with isopropyl alcohol, I injected the right traps (2) with 2cc of 1% lidocaine. The patient tolerated well, and no complications were experienced. Post-injection instructions were provided. 9. All questions were encouraged and answered. My NP will see her back in 2 months. 30 minutes were spent with the patient today.

## 2015-10-19 NOTE — Patient Instructions (Signed)
  PLEASE CALL ME WITH ANY PROBLEMS OR QUESTIONS (#[phone]).      

## 2015-10-23 LAB — TOXASSURE SELECT,+ANTIDEPR,UR: PDF: 0

## 2015-10-23 NOTE — Progress Notes (Signed)
Urine drug screen for this encounter is consistent for prescribed medication 

## 2015-10-26 DIAGNOSIS — Z0100 Encounter for examination of eyes and vision without abnormal findings: Secondary | ICD-10-CM | POA: Diagnosis not present

## 2015-10-26 DIAGNOSIS — H5203 Hypermetropia, bilateral: Secondary | ICD-10-CM | POA: Diagnosis not present

## 2015-10-28 ENCOUNTER — Other Ambulatory Visit: Payer: Self-pay | Admitting: Registered Nurse

## 2015-11-07 ENCOUNTER — Other Ambulatory Visit: Payer: Self-pay | Admitting: Physical Medicine & Rehabilitation

## 2015-11-08 ENCOUNTER — Other Ambulatory Visit: Payer: Self-pay | Admitting: Physical Medicine & Rehabilitation

## 2015-11-19 ENCOUNTER — Other Ambulatory Visit: Payer: Self-pay | Admitting: Physical Medicine & Rehabilitation

## 2015-11-23 ENCOUNTER — Telehealth: Payer: Self-pay | Admitting: Internal Medicine

## 2015-11-23 DIAGNOSIS — K219 Gastro-esophageal reflux disease without esophagitis: Secondary | ICD-10-CM

## 2015-11-23 NOTE — Telephone Encounter (Signed)
[name] [name], pt needs June appt

## 2015-11-23 NOTE — Telephone Encounter (Signed)
Indication for such a high dose remains very unclear based upon review of multiple endoscopies and esophageal motility studies.  This will need to be readdressed by the new PCP.  I will provide a 3 month supply, but no refills as re-evaluation in the clinic will be necessary before the medication will be refilled again.

## 2015-11-25 NOTE — Telephone Encounter (Signed)
Dr. Evette Doffing will not have any openings until July.  Routing back to nurse.

## 2015-12-09 ENCOUNTER — Other Ambulatory Visit: Payer: Self-pay | Admitting: Physical Medicine & Rehabilitation

## 2015-12-15 DIAGNOSIS — I10 Essential (primary) hypertension: Secondary | ICD-10-CM | POA: Diagnosis not present

## 2015-12-15 DIAGNOSIS — J4520 Mild intermittent asthma, uncomplicated: Secondary | ICD-10-CM | POA: Diagnosis not present

## 2015-12-15 DIAGNOSIS — Z8669 Personal history of other diseases of the nervous system and sense organs: Secondary | ICD-10-CM | POA: Diagnosis not present

## 2015-12-15 DIAGNOSIS — E782 Mixed hyperlipidemia: Secondary | ICD-10-CM | POA: Diagnosis not present

## 2015-12-15 DIAGNOSIS — G894 Chronic pain syndrome: Secondary | ICD-10-CM | POA: Diagnosis not present

## 2015-12-15 DIAGNOSIS — R7301 Impaired fasting glucose: Secondary | ICD-10-CM | POA: Diagnosis not present

## 2015-12-15 DIAGNOSIS — R1310 Dysphagia, unspecified: Secondary | ICD-10-CM | POA: Diagnosis not present

## 2015-12-15 DIAGNOSIS — Z0000 Encounter for general adult medical examination without abnormal findings: Secondary | ICD-10-CM | POA: Diagnosis not present

## 2015-12-15 DIAGNOSIS — E039 Hypothyroidism, unspecified: Secondary | ICD-10-CM | POA: Diagnosis not present

## 2015-12-15 DIAGNOSIS — N3946 Mixed incontinence: Secondary | ICD-10-CM | POA: Diagnosis not present

## 2015-12-22 ENCOUNTER — Encounter: Payer: Medicare HMO | Attending: Physical Medicine and Rehabilitation | Admitting: Registered Nurse

## 2015-12-22 ENCOUNTER — Encounter: Payer: Self-pay | Admitting: Registered Nurse

## 2015-12-22 VITALS — BP 142/87 | HR 97 | Resp 16

## 2015-12-22 DIAGNOSIS — Z79899 Other long term (current) drug therapy: Secondary | ICD-10-CM | POA: Insufficient documentation

## 2015-12-22 DIAGNOSIS — M797 Fibromyalgia: Secondary | ICD-10-CM | POA: Diagnosis not present

## 2015-12-22 DIAGNOSIS — M19011 Primary osteoarthritis, right shoulder: Secondary | ICD-10-CM | POA: Diagnosis not present

## 2015-12-22 DIAGNOSIS — M5010 Cervical disc disorder with radiculopathy, unspecified cervical region: Secondary | ICD-10-CM

## 2015-12-22 DIAGNOSIS — G894 Chronic pain syndrome: Secondary | ICD-10-CM

## 2015-12-22 DIAGNOSIS — Z5181 Encounter for therapeutic drug level monitoring: Secondary | ICD-10-CM

## 2015-12-22 DIAGNOSIS — F418 Other specified anxiety disorders: Secondary | ICD-10-CM

## 2015-12-22 DIAGNOSIS — M47816 Spondylosis without myelopathy or radiculopathy, lumbar region: Secondary | ICD-10-CM

## 2015-12-22 DIAGNOSIS — M47817 Spondylosis without myelopathy or radiculopathy, lumbosacral region: Secondary | ICD-10-CM

## 2015-12-22 MED ORDER — HYDROCODONE-ACETAMINOPHEN 5-325 MG PO TABS: 1.0000 | ORAL_TABLET | Freq: Three times a day (TID) | ORAL | Status: DC | PRN

## 2015-12-22 NOTE — Progress Notes (Signed)
Subjective:    Patient ID: [name] [name], female    DOB: 10/12/1945, 70 y.o.   MRN: [id number]  HPI: [name] is a 70 year old female who returns for follow up for chronic pain and medication refill.She states her pain is located in her neck (mainly right side) radiating into her right shoulder and lower back .Also states she's having increase tingling in her neck and lower extremities. We will increase gabapentin to QID, she was instructed to call office on Monday December 28, 2015 to evaluate medication change, she verbalizes understanding. She rates her pain 7. Her current exercise regime is walking and performing stretching exercises.   Pain Inventory Average Pain 8 Pain Right Now 7 My pain is NA  In the last 24 hours, has pain interfered with the following? General activity 3 Relation with others 8 Enjoyment of life 7 What TIME of day is your pain at its worst? daytime Sleep (in general) Fair  Pain is worse with: bending, standing and some activites Pain improves with: rest, heat/ice, pacing activities, medication and injections Relief from Meds: NA  Mobility use a cane ability to climb steps?  yes do you drive?  yes Do you have any goals in this area?  yes  Function I need assistance with the following:  meal prep, household duties and shopping Do you have any goals in this area?  yes  Neuro/Psych bladder control problems bowel control problems weakness numbness tremor dizziness depression anxiety  Prior Studies bone scan x-rays  Physicians involved in your care Primary care NA   Family History  Problem Relation Age of Onset  . Cancer      breast -- grandmother  . Cancer      lung -- uncles (3)  . Leukemia Sister   . Diabetes Father   . Diabetes Mother   . Coronary artery disease Father    Social History   Social History  . Marital Status: Married    Spouse Name: N/A  . Number of Children: N/A  . Years of Education: N/A   Social  History Main Topics  . Smoking status: Never Smoker   . Smokeless tobacco: Never Used  . Alcohol Use: No  . Drug Use: No  . Sexual Activity: Not Asked   Other Topics Concern  . None   Social History Narrative   Past Surgical History  Procedure Laterality Date  . Wrist surgery Left 2000  . Orif ankle fracture  01/10/2012    Procedure: OPEN REDUCTION INTERNAL FIXATION (ORIF) ANKLE FRACTURE;  Surgeon: [name] Kava, MD;  Location: [facility];  Service: Orthopedics;  Laterality: Left;  . Cardiac catheterization  01-17-2003  dr [name] [name]    normal coronary arteries and LVF,  ef 60%  . Transthoracic echocardiogram  03-02-2009    normal echo,  ef 55-60%  . Negative sleep study  2000   per pt  . Total thyroidectomy  1985    benign tumor  . Excision morton's neuroma  2002    bilateral feet  . Vaginal hysterectomy  1984  . Laparoscopic cholecystectomy  2000  . Excision of breast biopsy Right 1990's    benign  . Interstim implant placement N/A 02/10/2015    Procedure: INTERSTIM IMPLANT FIRST STAGE;  Surgeon: [name] Loser, MD;  Location: [facility];  Service: Urology;  Laterality: N/A;  . Interstim implant placement N/A 02/10/2015    Procedure: INTERSTIM IMPLANT SECOND STAGE;  Surgeon: [name] Loser, MD;  Location: [place];  Service: Urology;  Laterality: N/A;   Past Medical History  Diagnosis Date  . Anxiety   . GERD (gastroesophageal reflux disease)   . HTN (hypertension)   . Depression   . Fibromyalgia   . Hemorrhoids   . Migraine headache   . Cervical radiculopathy   . Rotator cuff syndrome of right shoulder   . Chronic neck pain   . Chronic back pain     R > L  . Lumbar radicular pain     R>L legs  . History of TIA (transient ischemic attack)     02-27-2009  no residual  . Hyperlipidemia   . History of hiatal hernia   . Dysphagia     functional --  takes small bites  . Urge urinary incontinence     refractory  . PONV  (postoperative nausea and vomiting)   . Mild intermittent asthma with allergic rhinitis without complication   . Hypothyroidism, postsurgical   . History of diabetes mellitus, type II     was diet controlled -- per pt pcp she was no longer diabetic  . History of benign thyroid tumor   . Wears glasses   . History of ankle fracture 01/08/2012    Status post open reduction internal fixation of a left ankle trimalleolar fracture by [name] [name] on 01/10/2012.     BP 142/87 mmHg  Pulse 97  Resp 16  SpO2 97%  Opioid Risk Score:   Fall Risk Score:  `1  Depression screen PHQ 2/9  Depression screen [facility] 2/9 12/22/2015 10/19/2015 08/20/2015 06/15/2015 03/23/2015 02/27/2015 10/28/2014  Decreased Interest 0 0 0 0 1 0 0  Down, Depressed, Hopeless 0 1 1 0 3 0 1  PHQ - 2 Score 0 1 1 0 4 0 1  Altered sleeping - 2 - - 0 - 3  Tired, decreased energy - 2 - - 3 - 3  Change in appetite - 3 - - 1 - 2  Feeling bad or failure about yourself  - 0 - - 0 - 0  Trouble concentrating - 0 - - 0 - 0  Moving slowly or fidgety/restless - 3 - - 0 - 0  Suicidal thoughts - 0 - - 0 - 0  PHQ-9 Score - 11 - - 8 - 9       Review of Systems  Constitutional: Positive for fever, chills, diaphoresis and unexpected weight change.  Respiratory: Positive for cough, shortness of breath and wheezing.   Cardiovascular:       Limb swelling   Gastrointestinal: Positive for nausea and constipation.  Skin: Positive for rash.  All other systems reviewed and are negative.      Objective:   Physical Exam  Constitutional: She is oriented to person, place, and time. She appears well-developed and well-nourished.  HENT:  Head: Normocephalic and atraumatic.  Neck: Normal range of motion. Neck supple.  Cervical Paraspinal Tenderness: C-5- C-6 Mainly Right Side  Cardiovascular: Normal rate and regular rhythm.   Musculoskeletal:  Normal Muscle Bulk and Muscle Testing Reveals:  Upper Extremities: Full ROM and Muscle Strength  5/5 Right AC Joint Tenderness Lumbar Paraspinal Tenderness: L-3-L-4 Lower Extremities: Full ROM and Muscle Strength 5/5 Arises from chair with ease Narrow Based Gait   Neurological: She is alert and oriented to person, place, and time.  Skin: Skin is warm and dry.  Psychiatric: She has a normal mood and affect.  Nursing note and vitals reviewed.  Assessment & Plan:  1.History of fibromyalgia. Continue Current Medication Regime and Exercise Regime. Continue Gabapentin 2. Chronic low back pain. Myofascial pain.:  Refilled: Hydrocodone 5 mg one tablet every 8 hours as needed for pain #60. Second script given for the following month.  We will continue the opioid monitoring program, this consists of regular clinic visits, examinations, urine drug screen, pill counts as well as use of [place] Controlled Substance Reporting System. Continue Voltaren gel, Neurontin and Zanaflex  3. Rotator cuff syndrome./right bicipital tendonitis :No complaints today. Continue with heat and exercise as tolerated. 4. Cervical Radiculopathy: Increase Gabapentin to QID, call office on Monday December 28, 2015 to evaluate 5. Anxiety and depression. Continue Effexor and Klonopin.  20 minutes of face to face patient care time was spent during this visit. All questions were encouraged and answered.   F/U in 2 month

## 2015-12-22 NOTE — Patient Instructions (Addendum)
Increase Gabapentin to Four times a day: One capsule  At Breakfast, Lunch, Supper and One capsule at Bedtime.   Call office on Monday June 19th to evaluate medication changes:   [phone]

## 2015-12-29 ENCOUNTER — Telehealth: Payer: Self-pay | Admitting: *Deleted

## 2015-12-29 NOTE — Telephone Encounter (Signed)
Pt calling to report to [name] that taking the 4th Gabapentin pill is working very well.  It is preventing her legs from twitching. She is very happy with the results....fyi

## 2016-01-10 ENCOUNTER — Other Ambulatory Visit: Payer: Self-pay | Admitting: Physical Medicine & Rehabilitation

## 2016-01-11 ENCOUNTER — Other Ambulatory Visit: Payer: Self-pay | Admitting: Student in an Organized Health Care Education/Training Program

## 2016-01-18 ENCOUNTER — Ambulatory Visit: Payer: Medicare HMO | Admitting: Student in an Organized Health Care Education/Training Program

## 2016-01-18 ENCOUNTER — Encounter: Payer: Self-pay | Admitting: Student in an Organized Health Care Education/Training Program

## 2016-01-29 DIAGNOSIS — T8484XA Pain due to internal orthopedic prosthetic devices, implants and grafts, initial encounter: Secondary | ICD-10-CM | POA: Diagnosis not present

## 2016-01-29 DIAGNOSIS — S82852S Displaced trimalleolar fracture of left lower leg, sequela: Secondary | ICD-10-CM | POA: Diagnosis not present

## 2016-02-07 ENCOUNTER — Other Ambulatory Visit: Payer: Self-pay | Admitting: Physical Medicine & Rehabilitation

## 2016-02-07 DIAGNOSIS — M797 Fibromyalgia: Secondary | ICD-10-CM

## 2016-02-07 DIAGNOSIS — F418 Other specified anxiety disorders: Secondary | ICD-10-CM

## 2016-02-13 ENCOUNTER — Other Ambulatory Visit: Payer: Self-pay | Admitting: Internal Medicine

## 2016-02-13 DIAGNOSIS — K219 Gastro-esophageal reflux disease without esophagitis: Secondary | ICD-10-CM

## 2016-02-17 NOTE — Telephone Encounter (Signed)
error 

## 2016-02-22 ENCOUNTER — Encounter: Payer: Medicare HMO | Attending: Physical Medicine and Rehabilitation | Admitting: Registered Nurse

## 2016-02-22 ENCOUNTER — Encounter: Payer: Self-pay | Admitting: Registered Nurse

## 2016-02-22 VITALS — BP 144/83 | HR 72 | Resp 14

## 2016-02-22 DIAGNOSIS — M47817 Spondylosis without myelopathy or radiculopathy, lumbosacral region: Secondary | ICD-10-CM

## 2016-02-22 DIAGNOSIS — M797 Fibromyalgia: Secondary | ICD-10-CM | POA: Diagnosis not present

## 2016-02-22 DIAGNOSIS — M19011 Primary osteoarthritis, right shoulder: Secondary | ICD-10-CM | POA: Insufficient documentation

## 2016-02-22 DIAGNOSIS — M47816 Spondylosis without myelopathy or radiculopathy, lumbar region: Secondary | ICD-10-CM

## 2016-02-22 DIAGNOSIS — Z79899 Other long term (current) drug therapy: Secondary | ICD-10-CM | POA: Diagnosis not present

## 2016-02-22 DIAGNOSIS — M5412 Radiculopathy, cervical region: Secondary | ICD-10-CM | POA: Diagnosis not present

## 2016-02-22 DIAGNOSIS — F418 Other specified anxiety disorders: Secondary | ICD-10-CM

## 2016-02-22 DIAGNOSIS — Z5181 Encounter for therapeutic drug level monitoring: Secondary | ICD-10-CM | POA: Insufficient documentation

## 2016-02-22 DIAGNOSIS — G894 Chronic pain syndrome: Secondary | ICD-10-CM

## 2016-02-22 MED ORDER — HYDROCODONE-ACETAMINOPHEN 5-325 MG PO TABS: 1.0000 | ORAL_TABLET | Freq: Three times a day (TID) | ORAL | 0 refills | Status: DC | PRN

## 2016-02-22 MED ORDER — GABAPENTIN 300 MG PO CAPS
300.0000 mg | ORAL_CAPSULE | Freq: Four times a day (QID) | ORAL | 3 refills | Status: DC
Start: 2016-02-22 — End: 2016-04-19

## 2016-02-22 NOTE — Progress Notes (Signed)
Subjective:    Patient ID: [name] [name], female    DOB: 27-Feb-1946, 70 y.o.   MRN: [id number]  HPI: [name] is a 70 year old female who returns for follow up for chronic pain and medication refill.She states her pain is located in her neck (mainly right side) radiating into her right shoulder. She rates her pain 4. Her current exercise regime is walking and performing stretching exercises. Also states she has left ankle hardware removed a few weeks ago by Dr. Erlinda Hong, she wanted information regarding ankle exercises. Instructed to clear with Dr. Erlinda Hong, she verbalizes understanding. Also states she had dizziness a few days ago, refuses neurology referral, she will f/u with her PCP she states. Husband in room all questions answered.    Pain Inventory Average Pain 7 Pain Right Now 4 My pain is burning, dull, tingling and aching  In the last 24 hours, has pain interfered with the following? General activity 8 Relation with others 8 Enjoyment of life 8 What TIME of day is your pain at its worst? daytime, evening, night Sleep (in general) Fair  Pain is worse with: walking and some activites Pain improves with: rest, heat/ice, therapy/exercise, medication and injections Relief from Meds: 7  Mobility walk with assistance use a cane how many minutes can you walk? 5-6 ability to climb steps?  yes do you drive?  yes Do you have any goals in this area?  yes  Function retired I need assistance with the following:  meal prep, household duties and shopping  Neuro/Psych bladder control problems weakness numbness trouble walking dizziness depression anxiety  Prior Studies Any changes since last visit?  no bone scan x-rays CT/MRI  Physicians involved in your care Any changes since last visit?  no   Family History  Problem Relation Age of Onset  . Cancer      breast -- grandmother  . Cancer      lung -- uncles (3)  . Leukemia Sister   . Diabetes Father   . Coronary  artery disease Father   . Diabetes Mother    Social History   Social History  . Marital status: Married    Spouse name: N/A  . Number of children: N/A  . Years of education: N/A   Social History Main Topics  . Smoking status: Never Smoker  . Smokeless tobacco: Never Used  . Alcohol use No  . Drug use: No  . Sexual activity: Not Asked   Other Topics Concern  . None   Social History Narrative  . None   Past Surgical History:  Procedure Laterality Date  . CARDIAC CATHETERIZATION  01-17-2003  dr Darnell Level brodie   normal coronary arteries and LVF,  ef 60%  . EXCISION MORTON'S NEUROMA  2002   bilateral feet  . EXCISION OF BREAST BIOPSY Right 1990's   benign  . INTERSTIM IMPLANT PLACEMENT N/A 02/10/2015   Procedure: Barrie Lyme IMPLANT FIRST STAGE;  Surgeon: Bjorn Loser, MD;  Location: [facility];  Service: Urology;  Laterality: N/A;  . INTERSTIM IMPLANT PLACEMENT N/A 02/10/2015   Procedure: Barrie Lyme IMPLANT SECOND STAGE;  Surgeon: Bjorn Loser, MD;  Location: [facility];  Service: Urology;  Laterality: N/A;  . LAPAROSCOPIC CHOLECYSTECTOMY  2000  . NEGATIVE SLEEP STUDY  2000   per pt  . ORIF ANKLE FRACTURE  01/10/2012   Procedure: OPEN REDUCTION INTERNAL FIXATION (ORIF) ANKLE FRACTURE;  Surgeon: Sanjuana Kava, MD;  Location: [facility];  Service:  Orthopedics;  Laterality: Left;  . TOTAL THYROIDECTOMY  1985   benign tumor  . TRANSTHORACIC ECHOCARDIOGRAM  03-02-2009   normal echo,  ef 55-60%  . VAGINAL HYSTERECTOMY  1984  . WRIST SURGERY Left 2000   Past Medical History:  Diagnosis Date  . Anxiety   . Cervical radiculopathy   . Chronic back pain    R > L  . Chronic neck pain   . Depression   . Dysphagia    functional --  takes small bites  . Fibromyalgia   . GERD (gastroesophageal reflux disease)   . Hemorrhoids   . History of ankle fracture 01/08/2012   Status post open reduction internal fixation of a left ankle trimalleolar fracture by  Dr. Iona Hansen on 01/10/2012.    Marland Kitchen History of benign thyroid tumor   . History of diabetes mellitus, type II    was diet controlled -- per pt pcp she was no longer diabetic  . History of hiatal hernia   . History of TIA (transient ischemic attack)    02-27-2009  no residual  . HTN (hypertension)   . Hyperlipidemia   . Hypothyroidism, postsurgical   . Lumbar radicular pain    R>L legs  . Migraine headache   . Mild intermittent asthma with allergic rhinitis without complication   . PONV (postoperative nausea and vomiting)   . Rotator cuff syndrome of right shoulder   . Urge urinary incontinence    refractory  . Wears glasses    BP (!) 144/83 (BP Location: Left Arm, Patient Position: Sitting, Cuff Size: Large)   Pulse 72   Resp 14   SpO2 94%   Opioid Risk Score:   Fall Risk Score:  `1  Depression screen PHQ 2/9  Depression screen [facility] 2/9 12/22/2015 10/19/2015 08/20/2015 06/15/2015 03/23/2015 02/27/2015 10/28/2014  Decreased Interest 0 0 0 0 1 0 0  Down, Depressed, Hopeless 0 1 1 0 3 0 1  PHQ - 2 Score 0 1 1 0 4 0 1  Altered sleeping - 2 - - 0 - 3  Tired, decreased energy - 2 - - 3 - 3  Change in appetite - 3 - - 1 - 2  Feeling bad or failure about yourself  - 0 - - 0 - 0  Trouble concentrating - 0 - - 0 - 0  Moving slowly or fidgety/restless - 3 - - 0 - 0  Suicidal thoughts - 0 - - 0 - 0  PHQ-9 Score - 11 - - 8 - 9  Some recent data might be hidden    Review of Systems  Constitutional: Positive for diaphoresis and fever.  Eyes: Negative.   Respiratory: Positive for cough and shortness of breath.   Cardiovascular: Positive for leg swelling.  Gastrointestinal: Positive for nausea.  Endocrine:       High/low blood sugar  Genitourinary: Positive for difficulty urinating.  Musculoskeletal: Positive for gait problem.  Neurological: Positive for dizziness, weakness and numbness.  Psychiatric/Behavioral: Positive for dysphoric mood. The patient is nervous/anxious.   All  other systems reviewed and are negative.      Objective:   Physical Exam  Constitutional: She is oriented to person, place, and time. She appears well-developed and well-nourished.  HENT:  Head: Normocephalic and atraumatic.  Neck: Normal range of motion. Neck supple.  Cervical Paraspinal Tenderness: C-5- C-6  Cardiovascular: Normal rate and regular rhythm.   Pulmonary/Chest: Effort normal and breath sounds normal.  Musculoskeletal:  Normal Muscle  Bulk and Muscle Testing Reveals: Upper Extremities: Full ROM and Muscle Strength 5/5 Right Rhomboid Tenderness Back without Spinal Tenderness Lower Extremities: Full ROM and Muscle Strength 5/5 Arises from table with ease Narrow based gait  Neurological: She is alert and oriented to person, place, and time.  Skin: Skin is warm and dry.  Psychiatric: She has a normal mood and affect.  Nursing note and vitals reviewed.         Assessment & Plan:  1.History of fibromyalgia. Continue Current Medication Regime and Exercise Regime. Continue Gabapentin 2. Chronic low back pain. Myofascial pain.:  Refilled: Hydrocodone 5 mg one tablet every 8 hours as needed for pain #60. Second script given for the following month.  We will continue the opioid monitoring program, this consists of regular clinic visits, examinations, urine drug screen, pill counts as well as use of [place] Controlled Substance Reporting System. Continue Voltaren gel, Neurontin and Zanaflex  3. Rotator cuff syndrome./right bicipital tendonitis :No complaints today. Continue with heat and exercise as tolerated. 4. Cervical Radiculopathy: Continue Gabapentin  5. Anxiety and depression. Continue Effexor and Klonopin.  20 minutes of face to face patient care time was spent during this visit. All questions were encouraged and answered.   F/U in 2 month

## 2016-02-22 NOTE — Patient Instructions (Addendum)
Call Dr. Roda ShuttersXU prior to starting Ankle Exercises    Generic Ankle Exercises EXERCISES RANGE OF MOTION (ROM) AND STRETCHING EXERCISES These exercises may help you when beginning to rehabilitate your injury. Your symptoms may resolve with or without further involvement from your physician, physical therapist or athletic trainer. While completing these exercises, remember:   Restoring tissue flexibility helps normal motion to return to the joints. This allows healthier, less painful movement and activity.  An effective stretch should be held for at least 30 seconds.  A stretch should never be painful. You should only feel a gentle lengthening or release in the stretched tissue. RANGE OF MOTION - Dorsi/Plantar Flexion  While sitting with your right / left knee straight, draw the top of your foot upwards by flexing your ankle. Then reverse the motion, pointing your toes downward.  Hold each position for __________ seconds.  After completing your first set of exercises, repeat this exercise with your knee bent. Repeat __________ times. Complete this exercise __________ times per day.  RANGE OF MOTION - Ankle Alphabet  Imagine your right / left big toe is a pen.  Keeping your hip and knee still, write out the entire alphabet with your "pen." Make the letters as large as you can without increasing any discomfort. Repeat __________ times. Complete this exercise __________ times per day.  RANGE OF MOTION - Ankle Dorsiflexion, Active Assisted   Remove shoes and sit on a chair that is preferably not on a carpeted surface.  Place right / left foot under knee. Extend your opposite leg for support.  Keeping your heel down, slide your right / left foot back toward the chair until you feel a stretch at your ankle or calf. If you do not feel a stretch, slide your bottom forward to the edge of the chair while still keeping your heel down.  Hold this stretch for __________ seconds. Repeat __________  times. Complete this stretch __________ times per day.  STRENGTHENING EXERCISES  These exercises may help you when beginning to rehabilitate your injury. They may resolve your symptoms with or without further involvement from your physician, physical therapist or athletic trainer. While completing these exercises, remember:   Muscles can gain both the endurance and the strength needed for everyday activities through controlled exercises.  Complete these exercises as instructed by your physician, physical therapist or athletic trainer. Progress the resistance and repetitions only as guided.  You may experience muscle soreness or fatigue, but the pain or discomfort you are trying to eliminate should never worsen during these exercises. If this pain does worsen, stop and make certain you are following the directions exactly. If the pain is still present after adjustments, discontinue the exercise until you can discuss the trouble with your clinician. STRENGTH - Dorsiflexors  Secure a rubber exercise band/tubing to a fixed object (table, pole) and loop the other end around your right / left foot.  Sit on the floor facing the fixed object. The band/tubing should be slightly tense when your foot is relaxed.  Slowly draw your foot back toward you using your ankle and toes.  Hold this position for __________ seconds. Slowly release the tension in the band and return your foot to the starting position. Repeat __________ times. Complete this exercise __________ times per day.  STRENGTH - Plantar-flexors  Sit with your right / left leg extended. Holding onto both ends of a rubber exercise band/tubing, loop it around the ball of your foot. Keep a slight tension in the  band.  Slowly push your toes away from you, pointing them downward.  Hold this position for __________ seconds. Return slowly, controlling the tension in the band/tubing. Repeat __________ times. Complete this exercise __________ times  per day.  STRENGTH - Ankle Eversion  Secure one end of a rubber exercise band/tubing to a fixed object (table, pole). Loop the other end around your foot just before your toes.  Place your fists between your knees. This will focus your strengthening at your ankle.  Drawing the band/tubing across your opposite foot, slowly, pull your little toe out and up. Make sure the band/tubing is positioned to resist the entire motion.  Hold this position for __________ seconds.  Have your muscles resist the band/tubing as it slowly pulls your foot back to the starting position. Repeat __________ times. Complete this exercise __________ times per day.  STRENGTH - Ankle Inversion  Secure one end of a rubber exercise band/tubing to a fixed object (table, pole). Loop the other end around your foot just before your toes.  Place your fists between your knees. This will focus your strengthening at your ankle.  Slowly, pull your big toe up and in, making sure the band/tubing is positioned to resist the entire motion.  Hold this position for __________ seconds.  Have your muscles resist the band/tubing as it slowly pulls your foot back to the starting position. Repeat __________ times. Complete this exercises __________ times per day.  STRENGTH - Towel Curls  Sit in a chair positioned on a non-carpeted surface.  Place your foot on a towel, keeping your heel on the floor.  Pull the towel toward your heel by only curling your toes. Keep your heel on the floor. If instructed by your physician, physical therapist or athletic trainer, add weight to the end of the towel. Repeat __________ times. Complete this exercise __________ times per day. STRENGTH - Plantar-flexors, Standing  Stand with your feet shoulder width apart. Steady yourself with a wall or table using as little support as needed.  Keeping your weight evenly spread over the width of your feet, rise up on your toes.*  Hold this position for  __________ seconds. Repeat __________ times. Complete this exercise __________ times per day.  *If this is too easy, shift your weight toward your right / left leg until you feel challenged. Ultimately, you may be asked to do this exercise with your right / left foot only. BALANCE - Tandem Walking  Place your uninjured foot on a line 2-4 inches wide and at least 10 feet long.  Keeping your balance without using anything for extra support, place your right / left heel directly in front of your other foot.  Slowly raise your back foot up, lifting from the heel to the toes, and place it directly in front of the right / left foot.  Continue to walk along the line slowly. Walk for ____________________ feet. Repeat ____________________ times. Complete ____________________ times per day.   This information is not intended to replace advice given to you by your health care provider. Make sure you discuss any questions you have with your health care provider.   Document Released: 05/11/2005 Document Revised: 07/18/2014 Document Reviewed: 10/09/2008 Elsevier Interactive Patient Education [employer].

## 2016-03-02 LAB — TOXASSURE SELECT,+ANTIDEPR,UR: PDF: 0

## 2016-03-02 NOTE — Progress Notes (Signed)
Urine drug screen for this encounter is consistent for prescribed medications.   

## 2016-03-10 ENCOUNTER — Other Ambulatory Visit: Payer: Self-pay | Admitting: Physical Medicine & Rehabilitation

## 2016-03-10 DIAGNOSIS — M797 Fibromyalgia: Secondary | ICD-10-CM

## 2016-03-10 DIAGNOSIS — F418 Other specified anxiety disorders: Secondary | ICD-10-CM

## 2016-03-11 ENCOUNTER — Other Ambulatory Visit: Payer: Self-pay | Admitting: Physical Medicine & Rehabilitation

## 2016-03-12 DIAGNOSIS — Z0100 Encounter for examination of eyes and vision without abnormal findings: Secondary | ICD-10-CM | POA: Diagnosis not present

## 2016-03-16 DIAGNOSIS — G250 Essential tremor: Secondary | ICD-10-CM | POA: Diagnosis not present

## 2016-03-16 DIAGNOSIS — H5702 Anisocoria: Secondary | ICD-10-CM | POA: Diagnosis not present

## 2016-03-16 DIAGNOSIS — E782 Mixed hyperlipidemia: Secondary | ICD-10-CM | POA: Diagnosis not present

## 2016-03-16 DIAGNOSIS — I10 Essential (primary) hypertension: Secondary | ICD-10-CM | POA: Diagnosis not present

## 2016-03-16 DIAGNOSIS — R7301 Impaired fasting glucose: Secondary | ICD-10-CM | POA: Diagnosis not present

## 2016-04-11 ENCOUNTER — Other Ambulatory Visit: Payer: Self-pay | Admitting: Registered Nurse

## 2016-04-11 DIAGNOSIS — F418 Other specified anxiety disorders: Secondary | ICD-10-CM

## 2016-04-11 DIAGNOSIS — M797 Fibromyalgia: Secondary | ICD-10-CM

## 2016-04-12 ENCOUNTER — Telehealth: Payer: Self-pay

## 2016-04-12 NOTE — Telephone Encounter (Signed)
Rx refill request Venlafaxine 100 mg tab Last refill 04/11/2016 Last Ov: 02/22/16 Next appt 04/19/16 Please advise

## 2016-04-13 NOTE — Telephone Encounter (Signed)
She should have 3 months of medication

## 2016-04-14 ENCOUNTER — Ambulatory Visit (INDEPENDENT_AMBULATORY_CARE_PROVIDER_SITE_OTHER): Payer: Medicare HMO | Admitting: Orthopaedic Surgery

## 2016-04-14 DIAGNOSIS — M25572 Pain in left ankle and joints of left foot: Secondary | ICD-10-CM | POA: Diagnosis not present

## 2016-04-18 ENCOUNTER — Ambulatory Visit: Payer: Medicare HMO | Admitting: Physical Medicine & Rehabilitation

## 2016-04-19 ENCOUNTER — Encounter: Payer: Self-pay | Admitting: Registered Nurse

## 2016-04-19 ENCOUNTER — Encounter: Payer: Medicare HMO | Attending: Physical Medicine and Rehabilitation | Admitting: Registered Nurse

## 2016-04-19 VITALS — BP 131/87 | HR 91

## 2016-04-19 DIAGNOSIS — G894 Chronic pain syndrome: Secondary | ICD-10-CM

## 2016-04-19 DIAGNOSIS — Z8781 Personal history of (healed) traumatic fracture: Secondary | ICD-10-CM

## 2016-04-19 DIAGNOSIS — Z79899 Other long term (current) drug therapy: Secondary | ICD-10-CM

## 2016-04-19 DIAGNOSIS — M5412 Radiculopathy, cervical region: Secondary | ICD-10-CM | POA: Diagnosis not present

## 2016-04-19 DIAGNOSIS — M797 Fibromyalgia: Secondary | ICD-10-CM | POA: Diagnosis not present

## 2016-04-19 DIAGNOSIS — M1288 Other specific arthropathies, not elsewhere classified, other specified site: Secondary | ICD-10-CM

## 2016-04-19 DIAGNOSIS — F418 Other specified anxiety disorders: Secondary | ICD-10-CM

## 2016-04-19 DIAGNOSIS — Z5181 Encounter for therapeutic drug level monitoring: Secondary | ICD-10-CM | POA: Insufficient documentation

## 2016-04-19 DIAGNOSIS — M19011 Primary osteoarthritis, right shoulder: Secondary | ICD-10-CM | POA: Diagnosis not present

## 2016-04-19 DIAGNOSIS — M47816 Spondylosis without myelopathy or radiculopathy, lumbar region: Secondary | ICD-10-CM

## 2016-04-19 MED ORDER — HYDROCODONE-ACETAMINOPHEN 5-325 MG PO TABS: 1.0000 | ORAL_TABLET | Freq: Three times a day (TID) | ORAL | 0 refills | Status: DC | PRN

## 2016-04-19 MED ORDER — GABAPENTIN 300 MG PO CAPS: 300.0000 mg | ORAL_CAPSULE | Freq: Four times a day (QID) | ORAL | 3 refills | Status: DC

## 2016-04-19 NOTE — Progress Notes (Signed)
Subjective:    Patient ID: [name] [name], female    DOB: 17-Jun-1946, 70 y.o.   MRN: [id number]  HPI: [name] is a 70 year old female who returns for follow up for chronic pain and medication refill.She states her pain is located in her neck, bilateral rib pain and left ankle.She rates her pain 6. Her current exercise regime is using stationary peddle bicycle  for 10-15 minutes daily, walking and performing stretching exercises. Husband in room all questions answered.  Pain Inventory Average Pain 6 Pain Right Now 6 My pain is constant and aching  In the last 24 hours, has pain interfered with the following? General activity 4 Relation with others 2 Enjoyment of life 6 What TIME of day is your pain at its worst? all Sleep (in general) Fair  Pain is worse with: walking, standing and some activites Pain improves with: rest, heat/ice, pacing activities, medication and injections Relief from Meds: 7  Mobility use a cane how many minutes can you walk? 10 to 15 mins ability to climb steps?  yes do you drive?  yes Do you have any goals in this area?  yes  Function retired I need assistance with the following:  meal prep, household duties and shopping Do you have any goals in this area?  yes  Neuro/Psych bladder control problems bowel control problems weakness numbness confusion depression anxiety  Prior Studies Any changes since last visit?  no  Physicians involved in your care Any changes since last visit?  yes Primary care Dr. Erlinda Hong   Family History  Problem Relation Age of Onset  . Cancer      breast -- grandmother  . Cancer      lung -- uncles (3)  . Leukemia Sister   . Diabetes Father   . Coronary artery disease Father   . Diabetes Mother    Social History   Social History  . Marital status: Married    Spouse name: N/A  . Number of children: N/A  . Years of education: N/A   Social History Main Topics  . Smoking status: Never Smoker  .  Smokeless tobacco: Never Used  . Alcohol use No  . Drug use: No  . Sexual activity: Not on file   Other Topics Concern  . Not on file   Social History Narrative  . No narrative on file   Past Surgical History:  Procedure Laterality Date  . CARDIAC CATHETERIZATION  01-17-2003  dr Darnell Level brodie   normal coronary arteries and LVF,  ef 60%  . EXCISION MORTON'S NEUROMA  2002   bilateral feet  . EXCISION OF BREAST BIOPSY Right 1990's   benign  . INTERSTIM IMPLANT PLACEMENT N/A 02/10/2015   Procedure: Barrie Lyme IMPLANT FIRST STAGE;  Surgeon: Bjorn Loser, MD;  Location: [facility];  Service: Urology;  Laterality: N/A;  . INTERSTIM IMPLANT PLACEMENT N/A 02/10/2015   Procedure: Barrie Lyme IMPLANT SECOND STAGE;  Surgeon: Bjorn Loser, MD;  Location: [facility];  Service: Urology;  Laterality: N/A;  . LAPAROSCOPIC CHOLECYSTECTOMY  2000  . NEGATIVE SLEEP STUDY  2000   per pt  . ORIF ANKLE FRACTURE  01/10/2012   Procedure: OPEN REDUCTION INTERNAL FIXATION (ORIF) ANKLE FRACTURE;  Surgeon: Sanjuana Kava, MD;  Location: [facility];  Service: Orthopedics;  Laterality: Left;  . TOTAL THYROIDECTOMY  1985   benign tumor  . TRANSTHORACIC ECHOCARDIOGRAM  03-02-2009   normal echo,  ef 55-60%  . VAGINAL HYSTERECTOMY  80  . WRIST SURGERY Left 2000   Past Medical History:  Diagnosis Date  . Anxiety   . Cervical radiculopathy   . Chronic back pain    R > L  . Chronic neck pain   . Depression   . Dysphagia    functional --  takes small bites  . Fibromyalgia   . GERD (gastroesophageal reflux disease)   . Hemorrhoids   . History of ankle fracture 01/08/2012   Status post open reduction internal fixation of a left ankle trimalleolar fracture by Dr. Iona Hansen on 01/10/2012.    Marland Kitchen History of benign thyroid tumor   . History of diabetes mellitus, type II    was diet controlled -- per pt pcp she was no longer diabetic  . History of hiatal hernia   . History of TIA  (transient ischemic attack)    02-27-2009  no residual  . HTN (hypertension)   . Hyperlipidemia   . Hypothyroidism, postsurgical   . Lumbar radicular pain    R>L legs  . Migraine headache   . Mild intermittent asthma with allergic rhinitis without complication   . PONV (postoperative nausea and vomiting)   . Rotator cuff syndrome of right shoulder   . Urge urinary incontinence    refractory  . Wears glasses    There were no vitals taken for this visit.  Opioid Risk Score:   Fall Risk Score:  `1  Depression screen PHQ 2/9  Depression screen [facility] 2/9 12/22/2015 10/19/2015 08/20/2015 06/15/2015 03/23/2015 02/27/2015 10/28/2014  Decreased Interest 0 0 0 0 1 0 0  Down, Depressed, Hopeless 0 1 1 0 3 0 1  PHQ - 2 Score 0 1 1 0 4 0 1  Altered sleeping - 2 - - 0 - 3  Tired, decreased energy - 2 - - 3 - 3  Change in appetite - 3 - - 1 - 2  Feeling bad or failure about yourself  - 0 - - 0 - 0  Trouble concentrating - 0 - - 0 - 0  Moving slowly or fidgety/restless - 3 - - 0 - 0  Suicidal thoughts - 0 - - 0 - 0  PHQ-9 Score - 11 - - 8 - 9  Some recent data might be hidden    Review of Systems  Constitutional: Positive for appetite change, diaphoresis and unexpected weight change.  Respiratory: Positive for cough and shortness of breath.   Gastrointestinal: Positive for abdominal pain and nausea.  Genitourinary: Positive for difficulty urinating.  Musculoskeletal: Positive for gait problem and joint swelling.  Skin: Positive for color change and rash.  Neurological: Positive for dizziness, weakness and numbness.  Psychiatric/Behavioral: Positive for confusion and dysphoric mood. The patient is nervous/anxious.   All other systems reviewed and are negative.      Objective:   Physical Exam  Constitutional: She is oriented to person, place, and time. She appears well-developed and well-nourished.  HENT:  Head: Normocephalic and atraumatic.  Neck: Normal range of motion. Neck supple.    Cardiovascular: Normal rate and regular rhythm.   Pulmonary/Chest: Effort normal and breath sounds normal.  Musculoskeletal:  Normal Muscle Bulk and Muscle Testing Reveals: Upper Extremities: Full ROM and Muscle Strength 5/5 Back without spinal tenderness Lower Extremities: Full ROM and Muscle Strength 5/5 Left ankle Brace intact Arises from chair slowly using straight cane for support Narrow Based gait   Neurological: She is alert and oriented to person, place, and time.  Skin:  Skin is warm and dry.  Psychiatric: She has a normal mood and affect.  Nursing note and vitals reviewed.         Assessment & Plan:  1.History of fibromyalgia. Continue Current Medication Regime and Exercise Regime. Continue Gabapentin 2. Chronic low back pain. Myofascial pain.:  Refilled: Hydrocodone 5 mg one tablet every 8 hours as needed for pain #60. Second script given for the following month.  We will continue the opioid monitoring program, this consists of regular clinic visits, examinations, urine drug screen, pill counts as well as use of [place] Controlled Substance Reporting System. Continue Voltaren gel, Neurontin and Zanaflex  3. Rotator cuff syndrome./right bicipital tendonitis :No complaints today. Continue with heat and exercise as tolerated. 4. Cervical Radiculopathy: Continue Gabapentin  5. Anxiety and depression. Continue Effexor and Klonopin.  20 minutes of face to face patient care time was spent during this visit. All questions were encouraged and answered.   F/U in 2 month

## 2016-05-19 ENCOUNTER — Other Ambulatory Visit: Payer: Self-pay | Admitting: Physical Medicine & Rehabilitation

## 2016-05-19 DIAGNOSIS — F418 Other specified anxiety disorders: Secondary | ICD-10-CM

## 2016-05-23 ENCOUNTER — Other Ambulatory Visit: Payer: Self-pay | Admitting: *Deleted

## 2016-05-23 DIAGNOSIS — F418 Other specified anxiety disorders: Secondary | ICD-10-CM

## 2016-05-23 MED ORDER — CLONAZEPAM 1 MG PO TABS: ORAL_TABLET | ORAL | 1 refills | Status: DC

## 2016-06-15 ENCOUNTER — Encounter: Payer: Self-pay | Admitting: Physical Medicine & Rehabilitation

## 2016-06-15 ENCOUNTER — Encounter: Payer: Medicare HMO | Attending: Physical Medicine and Rehabilitation | Admitting: Physical Medicine & Rehabilitation

## 2016-06-15 ENCOUNTER — Telehealth: Payer: Self-pay | Admitting: Physical Medicine & Rehabilitation

## 2016-06-15 ENCOUNTER — Ambulatory Visit (HOSPITAL_COMMUNITY)
Admission: RE | Admit: 2016-06-15 | Discharge: 2016-06-15 | Disposition: A | Discharge status: Home / Self Care | Payer: Medicare HMO | Source: Ambulatory Visit | Attending: Physical Medicine & Rehabilitation | Admitting: Physical Medicine & Rehabilitation

## 2016-06-15 VITALS — BP 137/76 | HR 95 | Resp 14

## 2016-06-15 DIAGNOSIS — M19011 Primary osteoarthritis, right shoulder: Secondary | ICD-10-CM | POA: Diagnosis not present

## 2016-06-15 DIAGNOSIS — Z79899 Other long term (current) drug therapy: Secondary | ICD-10-CM | POA: Diagnosis not present

## 2016-06-15 DIAGNOSIS — M47816 Spondylosis without myelopathy or radiculopathy, lumbar region: Secondary | ICD-10-CM

## 2016-06-15 DIAGNOSIS — M797 Fibromyalgia: Secondary | ICD-10-CM

## 2016-06-15 DIAGNOSIS — M25531 Pain in right wrist: Secondary | ICD-10-CM | POA: Insufficient documentation

## 2016-06-15 DIAGNOSIS — M75100 Unspecified rotator cuff tear or rupture of unspecified shoulder, not specified as traumatic: Secondary | ICD-10-CM

## 2016-06-15 DIAGNOSIS — Z5181 Encounter for therapeutic drug level monitoring: Secondary | ICD-10-CM | POA: Diagnosis not present

## 2016-06-15 DIAGNOSIS — M546 Pain in thoracic spine: Secondary | ICD-10-CM

## 2016-06-15 DIAGNOSIS — M1288 Other specific arthropathies, not elsewhere classified, other specified site: Secondary | ICD-10-CM

## 2016-06-15 MED ORDER — GABAPENTIN 300 MG PO CAPS: 300.0000 mg | ORAL_CAPSULE | Freq: Three times a day (TID) | ORAL | 3 refills | Status: DC

## 2016-06-15 MED ORDER — HYDROCODONE-ACETAMINOPHEN 7.5-325 MG PO TABS
1.0000 | ORAL_TABLET | Freq: Four times a day (QID) | ORAL | 0 refills | Status: DC | PRN
Start: 2016-06-15 — End: 2016-07-15

## 2016-06-15 NOTE — Telephone Encounter (Signed)
Spoke with husband and he will have [place] call our office back.  We have scheduled her CT scan at [facility] on 06/23/16 to arrive at 10:10 at the [address] address.  If she needs to reschedule, call them at [phone].

## 2016-06-15 NOTE — Progress Notes (Signed)
Subjective:    Patient ID: [name] [name], female    DOB: Sep 17, 1945, 70 y.o.   MRN: [id number]  HPI  [name] [name] is here in follow up of her chronic pain. She has had more pain in her right wrist and left ankle over the last couple months. The right wrist was actually exacerbated on Saturday while massaging her son. It was so painful this weekend that she was unable to use the hand. Over the last few days however the pain has begun to improve with rest.   Her low back and mid back have been troublesome also. She states that the back is tight and she has difficulty finding a comfortable position.   In addition to the above, the left ankle has been increasingly painful. She walks around at home without shoes. Daughter states she can only tolerate wearing tennis shoes for so long before she has to take them off. She has seen Dr. Erlinda Hong who has concerns that her FMS may be affecting her pain.   Daughter accompanies her today and feels that her mother has regressed and is not able to move much any more. She is here to support her and to help her "express" her problems. Daughter thinks she needs more pain medication.   Husband apparently is having increasing health issues also.   Pain Inventory Average Pain 9 Pain Right Now 8 My pain is constant, sharp, burning, dull, stabbing, tingling and aching  In the last 24 hours, has pain interfered with the following? General activity 7 Relation with others 3 Enjoyment of life 9 What TIME of day is your pain at its worst? all Sleep (in general) Poor  Pain is worse with: walking, bending, sitting, standing and some activites Pain improves with: rest, heat/ice, medication and injections Relief from Meds: 3  Mobility walk with assistance use a cane how many minutes can you walk? 15 ability to climb steps?  no do you drive?  yes Do you have any goals in this area?  yes  Function retired I need assistance with the following:  dressing, bathing, meal  prep, household duties and shopping  Neuro/Psych bladder control problems weakness trouble walking spasms depression anxiety  Prior Studies Any changes since last visit?  no  Physicians involved in your care Any changes since last visit?  no   Family History  Problem Relation Age of Onset  . Cancer      breast -- grandmother  . Cancer      lung -- uncles (3)  . Leukemia Sister   . Diabetes Father   . Coronary artery disease Father   . Diabetes Mother    Social History   Social History  . Marital status: Married    Spouse name: N/A  . Number of children: N/A  . Years of education: N/A   Social History Main Topics  . Smoking status: Never Smoker  . Smokeless tobacco: Never Used  . Alcohol use No  . Drug use: No  . Sexual activity: Not Asked   Other Topics Concern  . None   Social History Narrative  . None   Past Surgical History:  Procedure Laterality Date  . CARDIAC CATHETERIZATION  01-17-2003  dr Darnell Level brodie   normal coronary arteries and LVF,  ef 60%  . EXCISION MORTON'S NEUROMA  2002   bilateral feet  . EXCISION OF BREAST BIOPSY Right 1990's   benign  . INTERSTIM IMPLANT PLACEMENT N/A 02/10/2015   Procedure: Barrie Lyme IMPLANT FIRST  STAGE;  Surgeon: Bjorn Loser, MD;  Location: [facility];  Service: Urology;  Laterality: N/A;  . INTERSTIM IMPLANT PLACEMENT N/A 02/10/2015   Procedure: Barrie Lyme IMPLANT SECOND STAGE;  Surgeon: Bjorn Loser, MD;  Location: [facility];  Service: Urology;  Laterality: N/A;  . LAPAROSCOPIC CHOLECYSTECTOMY  2000  . NEGATIVE SLEEP STUDY  2000   per pt  . ORIF ANKLE FRACTURE  01/10/2012   Procedure: OPEN REDUCTION INTERNAL FIXATION (ORIF) ANKLE FRACTURE;  Surgeon: Sanjuana Kava, MD;  Location: [facility];  Service: Orthopedics;  Laterality: Left;  . TOTAL THYROIDECTOMY  1985   benign tumor  . TRANSTHORACIC ECHOCARDIOGRAM  03-02-2009   normal echo,  ef 55-60%  . VAGINAL HYSTERECTOMY  1984  .  WRIST SURGERY Left 2000   Past Medical History:  Diagnosis Date  . Anxiety   . Cervical radiculopathy   . Chronic back pain    R > L  . Chronic neck pain   . Depression   . Dysphagia    functional --  takes small bites  . Fibromyalgia   . GERD (gastroesophageal reflux disease)   . Hemorrhoids   . History of ankle fracture 01/08/2012   Status post open reduction internal fixation of a left ankle trimalleolar fracture by Dr. Iona Hansen on 01/10/2012.    Marland Kitchen History of benign thyroid tumor   . History of diabetes mellitus, type II    was diet controlled -- per pt pcp she was no longer diabetic  . History of hiatal hernia   . History of TIA (transient ischemic attack)    02-27-2009  no residual  . HTN (hypertension)   . Hyperlipidemia   . Hypothyroidism, postsurgical   . Lumbar radicular pain    R>L legs  . Migraine headache   . Mild intermittent asthma with allergic rhinitis without complication   . PONV (postoperative nausea and vomiting)   . Rotator cuff syndrome of right shoulder   . Urge urinary incontinence    refractory  . Wears glasses    BP 137/76 (BP Location: Left Arm, Patient Position: Sitting, Cuff Size: Normal)   Pulse 95   Resp 14   SpO2 97%   Opioid Risk Score:   Fall Risk Score:  `1  Depression screen PHQ 2/9  Depression screen [facility] 2/9 12/22/2015 10/19/2015 08/20/2015 06/15/2015 03/23/2015 02/27/2015 10/28/2014  Decreased Interest 0 0 0 0 1 0 0  Down, Depressed, Hopeless 0 1 1 0 3 0 1  PHQ - 2 Score 0 1 1 0 4 0 1  Altered sleeping - 2 - - 0 - 3  Tired, decreased energy - 2 - - 3 - 3  Change in appetite - 3 - - 1 - 2  Feeling bad or failure about yourself  - 0 - - 0 - 0  Trouble concentrating - 0 - - 0 - 0  Moving slowly or fidgety/restless - 3 - - 0 - 0  Suicidal thoughts - 0 - - 0 - 0  PHQ-9 Score - 11 - - 8 - 9  Some recent data might be hidden    Review of Systems  Constitutional: Positive for appetite change, chills, diaphoresis, fever and  unexpected weight change.  HENT: Negative.   Eyes: Negative.   Respiratory: Positive for cough.   Cardiovascular: Negative.   Gastrointestinal: Negative.   Endocrine:       High blood sugar  Genitourinary: Positive for urgency.  Incontinence  Musculoskeletal: Positive for gait problem.       Spasms   Skin: Negative.   Allergic/Immunologic: Negative.   Neurological: Positive for weakness.  Hematological: Negative.   Psychiatric/Behavioral: Positive for dysphoric mood. The patient is nervous/anxious.        Objective:   Physical Exam  Constitutional: She is oriented to person, place, and time. She doesn't appear to be in distress. Marland Kitchen  HENT:  Head: Normocephalic and atraumatic.  Neck: Normal range of motion. Neck supple.  She has tenderness along the lower cervical paraspinals into the traps. Her head is forward. The muscles are alos taut in this area.  Cardiovascular: RRR   Musculoskeletal: Right hand with tenderness along the ulnar wrist as well as the right EPL and 1st CMC. Finkelsteins was equivocal. There is no associated swelling Lumbar Paraspinal Tenderness: L-3-L-5 and pain in the low thoracic spine as well with associated paraspinal spasm. She is limited with trunk rotation. She could bend at the waist to 60-70 degrees.  Lower Extremities: Full ROM and Muscle Strength 5/5 Left ankle is tender to palpation. Ankle ROM is decreased. Surgical hardware is palpable on lateral ankle. She is wearing flat shoes.    Neurological: She is alert and oriented to person, place, and time. Motor exam intact. No focal sensory findings.  Skin: Skin is warm and dry.  Psychiatric: She is pleasant but remains anxious.   .         Assessment & Plan:  1.History of fibromyalgia.  . Continue Gabapentin  -reviewed the fact that part of managing her multiple issues is "managing" her pain----there is no cure or "fix" to some of her problems. Needs to work on other strategies to  manage pain which include exercises, recreational activities, spiritual activities, etc.  2. Chronic low back pain. Given the steady increase in her low and mid back pain I have ordered CT's of the thoracic and lumbar spine without contrast.    -additionally increase hydrocodone to 7.5mg  q8 prn #70. We will continue the opioid monitoring program, this consists of regular clinic visits, examinations, urine drug screen, pill counts as well as use of [place] Controlled Substance Reporting System.  -Continue Voltaren gel, Neurontin and Zanaflex  3. Rotator cuff syndrome./right bicipital tendonitis :No specific complaints today. Continue with heat and exercise as tolerated. 4. Cervical Radiculopathy: reduced gabapentin back to 300mg  TID due to intolerance of the QID schedule. Reviewed posture and ROM exercises for neck and shoulder girdle 5. Left ankle pain/prior fracture---recommended follow up with ortho regarding any further surgical options.  -in the meantime, she should be using her ASO and should be wearing more appropriate shoe wear.  6. Right wrist pain: tendonitis of the extensor mechanism and likely OA  -voltaren gel  -wrist splint  -relative rest and reasonable activties  -will order xrays of the wrist as well.  7. Anxiety and depression. Continue Effexor and Klonopin.  30 minutes of face to face patient care time was spent during this visit with patient and daughter.  All questions were encouraged and answered.   F/U in 1 month

## 2016-06-15 NOTE — Patient Instructions (Addendum)
WEAR YOUR ANKLE SLEEVE AND SHOES WHICH SUPPORT YOUR FOOT/ARCH  USE A WRIST SPLINT TO HELP IMMOBILIZE  IT.    CONTACT DR. Erlinda Hong ABOUT OPTIONS FOR YOUR ANKLE.   REMEMBER GOOD POSTURE AND YOUR DAILY EXERCISES.   PLEASE CALL ME WITH ANY PROBLEMS OR QUESTIONS [phone]   HAPPY HOLIDAYS!!!!                    *                * *             *   *   *         *  *   *  *  *     *  *  *  *  *  *  * *  *  *  *  *  *  *  *  *  * *               *  *               *  *               *  *

## 2016-06-17 ENCOUNTER — Telehealth: Payer: Self-pay | Admitting: Physical Medicine & Rehabilitation

## 2016-06-17 NOTE — Telephone Encounter (Signed)
Left message to have patient give [place] a call back to discuses Dr Naaman Plummer note.

## 2016-06-17 NOTE — Telephone Encounter (Signed)
Wrist xray essentially normal. Likely tendon/muscle strain---continue with plan as we discussed

## 2016-06-20 NOTE — Telephone Encounter (Signed)
Called patient, informed her of what dr Naaman Plummer had said about her xray

## 2016-06-22 LAB — TOXASSURE SELECT,+ANTIDEPR,UR

## 2016-06-23 ENCOUNTER — Other Ambulatory Visit: Payer: Medicare HMO

## 2016-06-24 NOTE — Progress Notes (Signed)
Urine drug screen for this encounter is consistent for prescribed medication 

## 2016-07-12 DIAGNOSIS — R69 Illness, unspecified: Secondary | ICD-10-CM | POA: Diagnosis not present

## 2016-07-15 ENCOUNTER — Encounter: Payer: Medicare HMO | Attending: Physical Medicine and Rehabilitation | Admitting: Registered Nurse

## 2016-07-15 ENCOUNTER — Encounter: Payer: Self-pay | Admitting: Registered Nurse

## 2016-07-15 VITALS — BP 158/90 | HR 73 | Resp 14

## 2016-07-15 DIAGNOSIS — F418 Other specified anxiety disorders: Secondary | ICD-10-CM

## 2016-07-15 DIAGNOSIS — M47816 Spondylosis without myelopathy or radiculopathy, lumbar region: Secondary | ICD-10-CM

## 2016-07-15 DIAGNOSIS — G894 Chronic pain syndrome: Secondary | ICD-10-CM

## 2016-07-15 DIAGNOSIS — M19011 Primary osteoarthritis, right shoulder: Secondary | ICD-10-CM | POA: Diagnosis not present

## 2016-07-15 DIAGNOSIS — M797 Fibromyalgia: Secondary | ICD-10-CM | POA: Diagnosis not present

## 2016-07-15 DIAGNOSIS — Z79899 Other long term (current) drug therapy: Secondary | ICD-10-CM

## 2016-07-15 DIAGNOSIS — M75100 Unspecified rotator cuff tear or rupture of unspecified shoulder, not specified as traumatic: Secondary | ICD-10-CM

## 2016-07-15 DIAGNOSIS — G8929 Other chronic pain: Secondary | ICD-10-CM

## 2016-07-15 DIAGNOSIS — M25562 Pain in left knee: Secondary | ICD-10-CM

## 2016-07-15 DIAGNOSIS — M5412 Radiculopathy, cervical region: Secondary | ICD-10-CM | POA: Diagnosis not present

## 2016-07-15 DIAGNOSIS — Z5181 Encounter for therapeutic drug level monitoring: Secondary | ICD-10-CM

## 2016-07-15 DIAGNOSIS — M1288 Other specific arthropathies, not elsewhere classified, other specified site: Secondary | ICD-10-CM

## 2016-07-15 DIAGNOSIS — M546 Pain in thoracic spine: Secondary | ICD-10-CM

## 2016-07-15 MED ORDER — HYDROCODONE-ACETAMINOPHEN 7.5-325 MG PO TABS: 1.0000 | ORAL_TABLET | Freq: Four times a day (QID) | ORAL | 0 refills | Status: DC | PRN

## 2016-07-15 MED ORDER — GABAPENTIN 300 MG PO CAPS: 300.0000 mg | ORAL_CAPSULE | Freq: Three times a day (TID) | ORAL | 3 refills | Status: DC

## 2016-07-15 MED ORDER — VENLAFAXINE HCL 100 MG PO TABS: ORAL_TABLET | ORAL | 2 refills | Status: DC

## 2016-07-15 NOTE — Progress Notes (Signed)
Subjective:    Patient ID: [name] [name], female    DOB: [birth year], 71 y.o.   MRN: [id number]  HPI: [name] [name] is a 70year old female who returns for follow up appointment for chronic pain and medication refill.She states her pain is located in her neck radiating into her right shoulder and left ankle.She rates her pain 7. Her current exercise regime is using stationary peddle bicycle  for 10-15 minutes daily, walking and performing stretching exercises.  Also states occasionally she has chest pressure, she denies chest pressure today, no SOB. Instructed to call 911 if this occurs again she verbalizes understanding.  She states she's dealing with family stress and her husband is ill, emotional support given.   Pain Inventory Average Pain 7 Pain Right Now 7 My pain is constant, sharp, burning, stabbing and aching  In the last 24 hours, has pain interfered with the following? General activity 7 Relation with others 7 Enjoyment of life 8 What TIME of day is your pain at its worst? Daytime,night Sleep (in general) Fair  Pain is worse with: bending, standing and some activites Pain improves with: rest, heat/ice, medication and injections Relief from Meds: 6  Mobility Do you have any goals in this area?  yes  Function Do you have any goals in this area?  yes  Neuro/Psych weakness numbness depression anxiety  Prior Studies Any changes since last visit?  no  Physicians involved in your care Any changes since last visit?  no   Family History  Problem Relation Age of Onset  . Cancer      breast -- grandmother  . Cancer      lung -- uncles (3)  . Leukemia Sister   . Diabetes Father   . Coronary artery disease Father   . Diabetes Mother    Social History   Social History  . Marital status: Married    Spouse name: N/A  . Number of children: N/A  . Years of education: N/A   Social History Main Topics  . Smoking status: Never Smoker  . Smokeless tobacco:  Never Used  . Alcohol use No  . Drug use: No  . Sexual activity: Not Asked   Other Topics Concern  . None   Social History Narrative  . None   Past Surgical History:  Procedure Laterality Date  . CARDIAC CATHETERIZATION  01-17-2003  dr Darnell Level brodie   normal coronary arteries and LVF,  ef 60%  . EXCISION MORTON'S NEUROMA  2002   bilateral feet  . EXCISION OF BREAST BIOPSY Right 1990's   benign  . INTERSTIM IMPLANT PLACEMENT N/A 02/10/2015   Procedure: Barrie Lyme IMPLANT FIRST STAGE;  Surgeon: Bjorn Loser, MD;  Location[facility];  Service: Urology;  Laterality: N/A;  . INTERSTIM IMPLANT PLACEMENT N/A 02/10/2015   Procedure: Barrie Lyme IMPLANT SECOND STAGE;  Surgeon: Bjorn Loser, MD;  Location: [facility];  Service: Urology;  Laterality: N/A;  . LAPAROSCOPIC CHOLECYSTECTOMY  2000  . NEGATIVE SLEEP STUDY  2000   per pt  . ORIF ANKLE FRACTURE  01/10/2012   Procedure: OPEN REDUCTION INTERNAL FIXATION (ORIF) ANKLE FRACTURE;  Surgeon: Sanjuana Kava, MD;  Location: [facility];  Service: Orthopedics;  Laterality: Left;  . TOTAL THYROIDECTOMY  1985   benign tumor  . TRANSTHORACIC ECHOCARDIOGRAM  03-02-2009   normal echo,  ef 55-60%  . VAGINAL HYSTERECTOMY  1984  . WRIST SURGERY Left 2000   Past Medical History:  Diagnosis  Date  . Anxiety   . Cervical radiculopathy   . Chronic back pain    R > L  . Chronic neck pain   . Depression   . Dysphagia    functional --  takes small bites  . Fibromyalgia   . GERD (gastroesophageal reflux disease)   . Hemorrhoids   . History of ankle fracture 01/08/2012   Status post open reduction internal fixation of a left ankle trimalleolar fracture by Dr. Iona Hansen on 01/10/2012.    Marland Kitchen History of benign thyroid tumor   . History of diabetes mellitus, type II    was diet controlled -- per pt pcp she was no longer diabetic  . History of hiatal hernia   . History of TIA (transient ischemic attack)    02-27-2009   no residual  . HTN (hypertension)   . Hyperlipidemia   . Hypothyroidism, postsurgical   . Lumbar radicular pain    R>L legs  . Migraine headache   . Mild intermittent asthma with allergic rhinitis without complication   . PONV (postoperative nausea and vomiting)   . Rotator cuff syndrome of right shoulder   . Urge urinary incontinence    refractory  . Wears glasses    BP (!) 158/90   Pulse 73   Resp 14   SpO2 97%   Opioid Risk Score:   Fall Risk Score:  `1  Depression screen PHQ 2/9  Depression screen [facility] 2/9 12/22/2015 10/19/2015 08/20/2015 06/15/2015 03/23/2015 02/27/2015 10/28/2014  Decreased Interest 0 0 0 0 1 0 0  Down, Depressed, Hopeless 0 1 1 0 3 0 1  PHQ - 2 Score 0 1 1 0 4 0 1  Altered sleeping - 2 - - 0 - 3  Tired, decreased energy - 2 - - 3 - 3  Change in appetite - 3 - - 1 - 2  Feeling bad or failure about yourself  - 0 - - 0 - 0  Trouble concentrating - 0 - - 0 - 0  Moving slowly or fidgety/restless - 3 - - 0 - 0  Suicidal thoughts - 0 - - 0 - 0  PHQ-9 Score - 11 - - 8 - 9  Some recent data might be hidden     Review of Systems  Constitutional: Positive for appetite change, chills, fever and unexpected weight change.  HENT: Negative.   Eyes: Negative.   Respiratory: Positive for cough, shortness of breath and wheezing.   Gastrointestinal: Positive for nausea.  Endocrine: Negative.   Genitourinary: Negative.   Musculoskeletal:       Limb Swelling   Skin: Positive for rash.  Allergic/Immunologic: Negative.   Neurological: Negative.   Hematological: Negative.   Psychiatric/Behavioral: Negative.   All other systems reviewed and are negative.      Objective:   Physical Exam  Constitutional: She is oriented to person, place, and time. She appears well-developed and well-nourished.  HENT:  Head: Normocephalic and atraumatic.  Neck: Normal range of motion. Neck supple.  Cardiovascular: Normal rate and regular rhythm.   Pulmonary/Chest: Effort normal and  breath sounds normal.  Musculoskeletal:  Normal Muscle Bulk and Muscle Testing Reveals: Upper Extremities: Full ROM and Muscle Strength 5/5 Right AC Joint Tenderness Lower Extremities: Full ROM and Muscle Strength 5/5 Arises from chair with ease Narrow Based Gait  Neurological: She is alert and oriented to person, place, and time.  Skin: Skin is warm and dry.  Psychiatric: She has a normal mood  and affect.  Nursing note and vitals reviewed.         Assessment & Plan:  1.History of fibromyalgia. Continue Current Medication Regime and Exercise Regime. Continue Gabapentin 2. Chronic low back pain. Myofascial pain.:  Refilled: Hydrocodone 7.5/325 mg one tablet every 6 hours as needed for pain #70.  We will continue the opioid monitoring program, this consists of regular clinic visits, examinations, urine drug screen, pill counts as well as use of [place] Controlled Substance Reporting System. Continue  Neurontin and Zanaflex  3. Rotator cuff syndrome./right bicipital tendonitis :No complaints today. Continue with heat and exercise as tolerated. 4. Cervical Radiculopathy: ContinueGabapentin  5. Anxiety and depression. Continue Effexor and Klonopin.  20 minutes of face to face patient care time was spent during this visit. All questions were encouraged and answered.   F/U in 1 month

## 2016-07-18 ENCOUNTER — Telehealth: Payer: Self-pay | Admitting: Physical Medicine & Rehabilitation

## 2016-07-18 DIAGNOSIS — M47817 Spondylosis without myelopathy or radiculopathy, lumbosacral region: Secondary | ICD-10-CM

## 2016-07-18 NOTE — Telephone Encounter (Signed)
Please let Valerya Know that i've ordered lumbar xrays at [place] on [address]. The insurance is going to be an issue in regard to the CT unless I have other imaging first. thanks

## 2016-07-18 NOTE — Telephone Encounter (Signed)
Called number listed [phone] Spoke with Jaquelyn Bitter (Patients son in-law) and asked that he have patient to give [place] a call back. Son-in law wasn't listed on patients release of information.   Called patient back patient has been notified.

## 2016-07-19 ENCOUNTER — Telehealth: Payer: Self-pay | Admitting: Physical Medicine & Rehabilitation

## 2016-07-19 NOTE — Telephone Encounter (Signed)
Advised patient that x-ray has been ordered at [facility] gave [phone]

## 2016-07-22 ENCOUNTER — Other Ambulatory Visit: Payer: Self-pay | Admitting: Nurse Practitioner

## 2016-07-22 DIAGNOSIS — Z1231 Encounter for screening mammogram for malignant neoplasm of breast: Secondary | ICD-10-CM

## 2016-07-26 DIAGNOSIS — R5383 Other fatigue: Secondary | ICD-10-CM | POA: Diagnosis not present

## 2016-07-26 DIAGNOSIS — R5381 Other malaise: Secondary | ICD-10-CM | POA: Diagnosis not present

## 2016-07-26 DIAGNOSIS — N644 Mastodynia: Secondary | ICD-10-CM | POA: Diagnosis not present

## 2016-07-28 ENCOUNTER — Telehealth: Payer: Self-pay | Admitting: Registered Nurse

## 2016-07-28 NOTE — Telephone Encounter (Signed)
On January 18,2018 NCCSR was reviewed: No conflict was seen on the [place] with Multiple Prescribers. Ms. Karel has a signed  Narcotic Contract with our office. If there were any discrepancies this would have been  reported to her  Physcian.

## 2016-08-08 ENCOUNTER — Other Ambulatory Visit: Payer: Self-pay | Admitting: Nurse Practitioner

## 2016-08-08 DIAGNOSIS — N63 Unspecified lump in unspecified breast: Secondary | ICD-10-CM

## 2016-08-09 ENCOUNTER — Ambulatory Visit: Payer: Medicare HMO | Admitting: Registered Nurse

## 2016-08-10 ENCOUNTER — Ambulatory Visit
Admission: RE | Admit: 2016-08-10 | Discharge: 2016-08-10 | Disposition: A | Discharge status: Home / Self Care | Payer: Medicare HMO | Source: Ambulatory Visit | Attending: Nurse Practitioner | Admitting: Nurse Practitioner

## 2016-08-10 ENCOUNTER — Telehealth: Payer: Self-pay | Admitting: Physical Medicine & Rehabilitation

## 2016-08-10 ENCOUNTER — Ambulatory Visit
Admission: RE | Admit: 2016-08-10 | Discharge: 2016-08-10 | Disposition: A | Discharge status: Home / Self Care | Payer: Medicare HMO | Source: Ambulatory Visit | Attending: Physical Medicine & Rehabilitation | Admitting: Physical Medicine & Rehabilitation

## 2016-08-10 DIAGNOSIS — M5136 Other intervertebral disc degeneration, lumbar region: Secondary | ICD-10-CM | POA: Diagnosis not present

## 2016-08-10 DIAGNOSIS — N63 Unspecified lump in unspecified breast: Secondary | ICD-10-CM

## 2016-08-10 DIAGNOSIS — N6313 Unspecified lump in the right breast, lower outer quadrant: Secondary | ICD-10-CM | POA: Diagnosis not present

## 2016-08-10 DIAGNOSIS — M47817 Spondylosis without myelopathy or radiculopathy, lumbosacral region: Secondary | ICD-10-CM

## 2016-08-10 NOTE — Telephone Encounter (Signed)
I reviewed her xrays. I concur with dictated report:  Moderate multilevel lumbar spine DDD, worse at L2-L3 and L5-S1 and to a lesser extent, L1-L2, with disc space height loss, endplate irregularity and sclerosis, progressed compared to the 12/2013 Examination.  I called Mrs. [name] [name] and left a VM to call the office.  She is set to see [name] next week. I recommend CT of lumbar spine based on symptoms. A full "neuro and musculoskeletal exam" needs to be dictated apparently for her insurance to cover such an exam.

## 2016-08-11 NOTE — Telephone Encounter (Signed)
I notified [name] [name] that [name] will discuss with her on Monday the plans for further testing.

## 2016-08-15 ENCOUNTER — Encounter: Payer: Medicare HMO | Attending: Physical Medicine and Rehabilitation | Admitting: Registered Nurse

## 2016-08-15 ENCOUNTER — Encounter: Payer: Self-pay | Admitting: Registered Nurse

## 2016-08-15 VITALS — BP 121/75 | HR 90 | Resp 14

## 2016-08-15 DIAGNOSIS — M5412 Radiculopathy, cervical region: Secondary | ICD-10-CM | POA: Diagnosis not present

## 2016-08-15 DIAGNOSIS — M546 Pain in thoracic spine: Secondary | ICD-10-CM | POA: Diagnosis not present

## 2016-08-15 DIAGNOSIS — N3946 Mixed incontinence: Secondary | ICD-10-CM | POA: Diagnosis not present

## 2016-08-15 DIAGNOSIS — M47817 Spondylosis without myelopathy or radiculopathy, lumbosacral region: Secondary | ICD-10-CM | POA: Diagnosis not present

## 2016-08-15 DIAGNOSIS — M47816 Spondylosis without myelopathy or radiculopathy, lumbar region: Secondary | ICD-10-CM

## 2016-08-15 DIAGNOSIS — M797 Fibromyalgia: Secondary | ICD-10-CM

## 2016-08-15 DIAGNOSIS — Z79899 Other long term (current) drug therapy: Secondary | ICD-10-CM | POA: Diagnosis not present

## 2016-08-15 DIAGNOSIS — Z5181 Encounter for therapeutic drug level monitoring: Secondary | ICD-10-CM | POA: Insufficient documentation

## 2016-08-15 DIAGNOSIS — M1288 Other specific arthropathies, not elsewhere classified, other specified site: Secondary | ICD-10-CM

## 2016-08-15 DIAGNOSIS — M19011 Primary osteoarthritis, right shoulder: Secondary | ICD-10-CM | POA: Insufficient documentation

## 2016-08-15 DIAGNOSIS — M7918 Myalgia, other site: Secondary | ICD-10-CM

## 2016-08-15 DIAGNOSIS — G894 Chronic pain syndrome: Secondary | ICD-10-CM

## 2016-08-15 DIAGNOSIS — M791 Myalgia: Secondary | ICD-10-CM

## 2016-08-15 DIAGNOSIS — M75100 Unspecified rotator cuff tear or rupture of unspecified shoulder, not specified as traumatic: Secondary | ICD-10-CM

## 2016-08-15 MED ORDER — HYDROCODONE-ACETAMINOPHEN 7.5-325 MG PO TABS: 1.0000 | ORAL_TABLET | Freq: Four times a day (QID) | ORAL | 0 refills | Status: DC | PRN

## 2016-08-15 NOTE — Progress Notes (Signed)
Subjective:    Patient ID: [name] [name], female    DOB: Jan 02, 1946, 71 y.o.   MRN: [id number]  HPI:  [name] is a 71year old female who returns for follow up appointment for chronic mid- low back pain she reports worsening of symptoms in the last few months the pain is affecting her positioning and mobility. . [name] [name] had a Lumbar X-Ray on 08/10/2016:  FINDINGS:  There are 5 non rib-bearing lumbar type vertebral bodies.  Normal alignment of the lumbar spine. No anterolisthesis or retrolisthesis. Lumbar vertebral body heights are preserved.  Moderate multilevel lumbar spine DDD, worse at L2-L3 and L5-S1 and to a lesser extent, L1-L2, with disc space height loss, endplate irregularity and sclerosis, progressed compared to the 12/2013 Examination.  Dr. Naaman Plummer reviewed the X-rays and he has recommended  CT Scan of her Thoracic and Lumbar Spine, based on her symptoms and X-ray results  for further workup and definitive treatment modality.  She also has myofascial pain, also being seen for a medication refill.She states her pain is located in her neck radiating into her right shoulder and mid-lower back. .She rates her pain 7. Her current exercise regime is  walking and performing stretching exercises as tolerated.  Also states she developed redness on her bilateral feet with the increase of Effexor, states she admits to switching Pharmacy from [facility] to [facility].Adverse reactions reviewed on Up to Date, no redness noted at this we will continue to observe.   Pain Inventory Average Pain 7 Pain Right Now 7 My pain is constant, burning, tingling and aching  In the last 24 hours, has pain interfered with the following? General activity 4 Relation with others 7 Enjoyment of life 8 What TIME of day is your pain at its worst? daytime, evening  Sleep (in general) Fair  Pain is worse with: sitting, standing and some activites Pain improves with: rest, heat/ice,  medication and injections Relief from Meds: 6  Mobility walk with assistance use a cane how many minutes can you walk? 15 ability to climb steps?  yes do you drive?  yes Do you have any goals in this area?  yes  Function retired I need assistance with the following:  meal prep, household duties and shopping Do you have any goals in this area?  yes  Neuro/Psych weakness numbness dizziness depression anxiety  Prior Studies Any changes since last visit?  no  Physicians involved in your care Any changes since last visit?  no   Family History  Problem Relation Age of Onset  . Cancer      breast -- grandmother  . Cancer      lung -- uncles (3)  . Leukemia Sister   . Diabetes Father   . Coronary artery disease Father   . Diabetes Mother    Social History   Social History  . Marital status: Married    Spouse name: N/A  . Number of children: N/A  . Years of education: N/A   Social History Main Topics  . Smoking status: Never Smoker  . Smokeless tobacco: Never Used  . Alcohol use No  . Drug use: No  . Sexual activity: Not Asked   Other Topics Concern  . None   Social History Narrative  . None   Past Surgical History:  Procedure Laterality Date  . CARDIAC CATHETERIZATION  01-17-2003  dr Darnell Level brodie   normal coronary arteries and LVF,  ef 60%  . EXCISION MORTON'S  NEUROMA  2002   bilateral feet  . EXCISION OF BREAST BIOPSY Right 1990's   benign  . INTERSTIM IMPLANT PLACEMENT N/A 02/10/2015   Procedure: Barrie Lyme IMPLANT FIRST STAGE;  Surgeon: Bjorn Loser, MD;  Location: [facility];  Service: Urology;  Laterality: N/A;  . INTERSTIM IMPLANT PLACEMENT N/A 02/10/2015   Procedure: Barrie Lyme IMPLANT SECOND STAGE;  Surgeon: Bjorn Loser, MD;  Location: [facility];  Service: Urology;  Laterality: N/A;  . LAPAROSCOPIC CHOLECYSTECTOMY  2000  . NEGATIVE SLEEP STUDY  2000   per pt  . ORIF ANKLE FRACTURE  01/10/2012   Procedure:  OPEN REDUCTION INTERNAL FIXATION (ORIF) ANKLE FRACTURE;  Surgeon: Sanjuana Kava, MD;  Location: [facility];  Service: Orthopedics;  Laterality: Left;  . TOTAL THYROIDECTOMY  1985   benign tumor  . TRANSTHORACIC ECHOCARDIOGRAM  03-02-2009   normal echo,  ef 55-60%  . VAGINAL HYSTERECTOMY  1984  . WRIST SURGERY Left 2000   Past Medical History:  Diagnosis Date  . Anxiety   . Cervical radiculopathy   . Chronic back pain    R > L  . Chronic neck pain   . Depression   . Dysphagia    functional --  takes small bites  . Fibromyalgia   . GERD (gastroesophageal reflux disease)   . Hemorrhoids   . History of ankle fracture 01/08/2012   Status post open reduction internal fixation of a left ankle trimalleolar fracture by Dr. Iona Hansen on 01/10/2012.    Marland Kitchen History of benign thyroid tumor   . History of diabetes mellitus, type II    was diet controlled -- per pt pcp she was no longer diabetic  . History of hiatal hernia   . History of TIA (transient ischemic attack)    02-27-2009  no residual  . HTN (hypertension)   . Hyperlipidemia   . Hypothyroidism, postsurgical   . Lumbar radicular pain    R>L legs  . Migraine headache   . Mild intermittent asthma with allergic rhinitis without complication   . PONV (postoperative nausea and vomiting)   . Rotator cuff syndrome of right shoulder   . Urge urinary incontinence    refractory  . Wears glasses    BP 121/75   Pulse 90   Resp 14   SpO2 94%   Opioid Risk Score:   Fall Risk Score:  `1  Depression screen PHQ 2/9  Depression screen [facility] 2/9 12/22/2015 10/19/2015 08/20/2015 06/15/2015 03/23/2015 02/27/2015 10/28/2014  Decreased Interest 0 0 0 0 1 0 0  Down, Depressed, Hopeless 0 1 1 0 3 0 1  PHQ - 2 Score 0 1 1 0 4 0 1  Altered sleeping - 2 - - 0 - 3  Tired, decreased energy - 2 - - 3 - 3  Change in appetite - 3 - - 1 - 2  Feeling bad or failure about yourself  - 0 - - 0 - 0  Trouble concentrating - 0 - - 0 - 0  Moving slowly or  fidgety/restless - 3 - - 0 - 0  Suicidal thoughts - 0 - - 0 - 0  PHQ-9 Score - 11 - - 8 - 9  Some recent data might be hidden    Review of Systems  Constitutional: Positive for chills, diaphoresis and unexpected weight change.  HENT: Negative.   Eyes: Negative.   Respiratory: Positive for cough, shortness of breath and wheezing.   Cardiovascular: Positive for leg swelling.  Gastrointestinal: Positive for constipation and nausea.  Endocrine: Negative.        High blood sugar  Musculoskeletal: Positive for arthralgias, back pain, gait problem, joint swelling, myalgias, neck pain and neck stiffness.  Skin: Positive for rash.  Allergic/Immunologic: Negative.   Neurological: Positive for dizziness, weakness and numbness.  Hematological: Negative.   Psychiatric/Behavioral: Positive for dysphoric mood. The patient is nervous/anxious.        Objective:   Physical Exam  Constitutional: She is oriented to person, place, and time. She appears well-developed and well-nourished.  HENT:  Head: Normocephalic and atraumatic.  Neck: Normal range of motion. Neck supple.  Cervical Paraspinal Tenderness: C-5- C-6   Cardiovascular: Normal rate and regular rhythm.   Pulmonary/Chest: Effort normal and breath sounds normal.  Musculoskeletal:  Normal Muscle Bulk and Muscle Testing Reveals:  Upper Extremities: Full ROM and Muscle Strength 5/5 Full ROM and Strength in Shoulders Thoracic Paraspinal Tenderness: T-1-T-3  T-7-T-9 Back Muscles are tight and tender between Rhomboids Lumbar Extension: 20 Degrees Lumbar Paraspinal Tenderness: L-3-L-5 HIP: Normal ROM upon Flexion and Extention Lower Extremities: Full ROM and Muscle Strength 5/5  Good Range of motion in all joints    Neurological: She is alert and oriented to person, place, and time.  Alert and oriented x3, attentive and follows commands Cranial Nerves: I not tested -II- XII Intact Sensory: Light Touch Sensation Intact Romberg Test :  Negative  Maintains Balance with eyes closed. No pronator drift Reflexes 2+  Skin: Skin is warm and dry.  Psychiatric: She has a normal mood and affect.  Nursing note and vitals reviewed.         Assessment & Plan:  1.History of fibromyalgia. Continue Current Medication Regime and Exercise Regime. Continue Gabapentin. 08/15/2016 2. Lumbar Facet Arthropathy/ Chronic Mid-low back pain. Myofascial pain.: CT Scan Thoracic and Lumbar ordered awaiting on approval from Insurance Refilled: Hydrocodone 7.5/325 mg one tablet every 6 hours as needed for pain #70.  We will continue the opioid monitoring program, this consists of regular clinic visits, examinations, urine drug screen, pill counts as well as use of [place] Controlled Substance Reporting System. Continue Gabapentin. 08/15/2016 3. Rotator cuff syndrome./right bicipital tendonitis :No complaints today. Continue with heat and exercise as tolerated. 08/15/2016 4. Cervical Radiculopathy: ContinueGabapentin.08/15/2016 5. Anxiety and depression. Continue Effexor and Klonopin. 08/15/2016  30 minutes of face to face patient care time was spent during this visit. All questions were encouraged and answered.  F/U in 1 month

## 2016-08-18 ENCOUNTER — Telehealth: Payer: Self-pay | Admitting: Physical Medicine & Rehabilitation

## 2016-08-18 NOTE — Telephone Encounter (Signed)
Patient is returning your phone call. 

## 2016-08-18 NOTE — Telephone Encounter (Signed)
Return [name] call, she was inquiring about her CT Scan, paperwork has been submitted awaiting approval from insurance company. She verbalizes understanding.

## 2016-08-19 ENCOUNTER — Other Ambulatory Visit: Payer: Self-pay | Admitting: Nurse Practitioner

## 2016-08-19 DIAGNOSIS — H532 Diplopia: Secondary | ICD-10-CM

## 2016-08-23 ENCOUNTER — Other Ambulatory Visit: Payer: Medicare HMO

## 2016-08-30 ENCOUNTER — Ambulatory Visit
Admission: RE | Admit: 2016-08-30 | Discharge: 2016-08-30 | Disposition: A | Discharge status: Home / Self Care | Payer: Medicare HMO | Source: Ambulatory Visit | Attending: Nurse Practitioner | Admitting: Nurse Practitioner

## 2016-08-30 DIAGNOSIS — R51 Headache: Secondary | ICD-10-CM | POA: Diagnosis not present

## 2016-08-30 DIAGNOSIS — H532 Diplopia: Secondary | ICD-10-CM

## 2016-09-13 ENCOUNTER — Encounter: Payer: Medicare HMO | Attending: Physical Medicine and Rehabilitation | Admitting: Physical Medicine & Rehabilitation

## 2016-09-13 ENCOUNTER — Encounter: Payer: Self-pay | Admitting: Physical Medicine & Rehabilitation

## 2016-09-13 VITALS — BP 146/87 | HR 79 | Resp 14

## 2016-09-13 DIAGNOSIS — G43019 Migraine without aura, intractable, without status migrainosus: Secondary | ICD-10-CM

## 2016-09-13 DIAGNOSIS — M797 Fibromyalgia: Secondary | ICD-10-CM

## 2016-09-13 DIAGNOSIS — Z5181 Encounter for therapeutic drug level monitoring: Secondary | ICD-10-CM | POA: Diagnosis not present

## 2016-09-13 DIAGNOSIS — M47817 Spondylosis without myelopathy or radiculopathy, lumbosacral region: Secondary | ICD-10-CM | POA: Diagnosis not present

## 2016-09-13 DIAGNOSIS — M19011 Primary osteoarthritis, right shoulder: Secondary | ICD-10-CM | POA: Insufficient documentation

## 2016-09-13 DIAGNOSIS — Z9181 History of falling: Secondary | ICD-10-CM

## 2016-09-13 DIAGNOSIS — Z79899 Other long term (current) drug therapy: Secondary | ICD-10-CM | POA: Diagnosis not present

## 2016-09-13 DIAGNOSIS — F418 Other specified anxiety disorders: Secondary | ICD-10-CM | POA: Diagnosis not present

## 2016-09-13 DIAGNOSIS — M7521 Bicipital tendinitis, right shoulder: Secondary | ICD-10-CM | POA: Diagnosis not present

## 2016-09-13 DIAGNOSIS — M546 Pain in thoracic spine: Secondary | ICD-10-CM

## 2016-09-13 DIAGNOSIS — M1288 Other specific arthropathies, not elsewhere classified, other specified site: Secondary | ICD-10-CM

## 2016-09-13 DIAGNOSIS — M47816 Spondylosis without myelopathy or radiculopathy, lumbar region: Secondary | ICD-10-CM

## 2016-09-13 DIAGNOSIS — R69 Illness, unspecified: Secondary | ICD-10-CM | POA: Diagnosis not present

## 2016-09-13 DIAGNOSIS — M75100 Unspecified rotator cuff tear or rupture of unspecified shoulder, not specified as traumatic: Secondary | ICD-10-CM

## 2016-09-13 MED ORDER — HYDROCODONE-ACETAMINOPHEN 7.5-325 MG PO TABS: 1.0000 | ORAL_TABLET | Freq: Four times a day (QID) | ORAL | 0 refills | Status: DC | PRN

## 2016-09-13 NOTE — Progress Notes (Signed)
Subjective:    Patient ID: [name] HOLTMEYER, female    DOB: [birth year], 71 y.o.   MRN: [id number]  HPI   [name] [name] is here in follow up of her chronic pain. She has had continued low back pain above the waist line. She finds that her pain is worse when she stands or is working in [employer]. She tries to stay active as possible. She uses a crutch or cane for support.  She states that her right shoulder and upper arm are tender at times. The wrist can be sore as well. I reviewed her wrist xrays which were unremarkable really. Hoa reports that she drops glasses from time to time and feels that her hand is "nuimb" although the numbness isn't consistent.   The increased hydrocodone is helpful for pain control.   She interested in a new left knee brace for knee control. She had a brace which was purchased 5 years ago, but it's worn out. The knee tends to become tender and swollen if she's up for long periods of time, and sometimes it can give out.   Pain Inventory Average Pain 7 Pain Right Now 4 My pain is constant and aching  In the last 24 hours, has pain interfered with the following? General activity 8 Relation with others 8 Enjoyment of life 4 What TIME of day is your pain at its worst? daytime and night Sleep (in general) Fair  Pain is worse with: walking, bending, standing and some activites Pain improves with: rest, heat/ice, therapy/exercise, medication and injections Relief from Meds: 7  Mobility use a cane ability to climb steps?  yes Do you have any goals in this area?  yes  Function retired I need assistance with the following:  meal prep, household duties and shopping  Neuro/Psych bladder control problems bowel control problems weakness numbness tremor spasms dizziness depression anxiety  Prior Studies Any changes since last visit?  no  Physicians involved in your care Any changes since last visit?  no   Family History  Problem Relation Age of  Onset  . Cancer      breast -- grandmother  . Cancer      lung -- uncles (3)  . Leukemia Sister   . Diabetes Father   . Coronary artery disease Father   . Diabetes Mother    Social History   Social History  . Marital status: Married    Spouse name: N/A  . Number of children: N/A  . Years of education: N/A   Social History Main Topics  . Smoking status: Never Smoker  . Smokeless tobacco: Never Used  . Alcohol use No  . Drug use: No  . Sexual activity: Not Asked   Other Topics Concern  . None   Social History Narrative  . None   Past Surgical History:  Procedure Laterality Date  . CARDIAC CATHETERIZATION  01-17-2003  dr Darnell Level brodie   normal coronary arteries and LVF,  ef 60%  . EXCISION MORTON'S NEUROMA  2002   bilateral feet  . EXCISION OF BREAST BIOPSY Right 1990's   benign  . INTERSTIM IMPLANT PLACEMENT N/A 02/10/2015   Procedure: Barrie Lyme IMPLANT FIRST STAGE;  Surgeon: Bjorn Loser, MD;  Location[facility];  Service: Urology;  Laterality: N/A;  . INTERSTIM IMPLANT PLACEMENT N/A 02/10/2015   Procedure: Barrie Lyme IMPLANT SECOND STAGE;  Surgeon: Bjorn Loser, MD;  Location: [facility];  Service: Urology;  Laterality: N/A;  . LAPAROSCOPIC CHOLECYSTECTOMY  [place]   per pt  . ORIF ANKLE FRACTURE  01/10/2012   Procedure: OPEN REDUCTION INTERNAL FIXATION (ORIF) ANKLE FRACTURE;  Surgeon: Sanjuana Kava, MD;  Location: [facility];  Service: Orthopedics;  Laterality: Left;  . TOTAL THYROIDECTOMY  1985   benign tumor  . TRANSTHORACIC ECHOCARDIOGRAM  03-02-2009   normal echo,  ef 55-60%  . VAGINAL HYSTERECTOMY  1984  . WRIST SURGERY Left 2000   Past Medical History:  Diagnosis Date  . Anxiety   . Cervical radiculopathy   . Chronic back pain    R > L  . Chronic neck pain   . Depression   . Dysphagia    functional --  takes small bites  . Fibromyalgia   . GERD (gastroesophageal reflux disease)   .  Hemorrhoids   . History of ankle fracture 01/08/2012   Status post open reduction internal fixation of a left ankle trimalleolar fracture by Dr. Iona Hansen on 01/10/2012.    Marland Kitchen History of benign thyroid tumor   . History of diabetes mellitus, type II    was diet controlled -- per pt pcp she was no longer diabetic  . History of hiatal hernia   . History of TIA (transient ischemic attack)    02-27-2009  no residual  . HTN (hypertension)   . Hyperlipidemia   . Hypothyroidism, postsurgical   . Lumbar radicular pain    R>L legs  . Migraine headache   . Mild intermittent asthma with allergic rhinitis without complication   . PONV (postoperative nausea and vomiting)   . Rotator cuff syndrome of right shoulder   . Urge urinary incontinence    refractory  . Wears glasses    BP (!) 146/87   Pulse 79   Resp 14   SpO2 97%   Opioid Risk Score:   Fall Risk Score:  `1  Depression screen PHQ 2/9  Depression screen [facility] 2/9 09/13/2016 12/22/2015 10/19/2015 08/20/2015 06/15/2015 03/23/2015 02/27/2015  Decreased Interest 3 0 0 0 0 1 0  Down, Depressed, Hopeless 3 0 1 1 0 3 0  PHQ - 2 Score 6 0 1 1 0 4 0  Altered sleeping - - 2 - - 0 -  Tired, decreased energy - - 2 - - 3 -  Change in appetite - - 3 - - 1 -  Feeling bad or failure about yourself  - - 0 - - 0 -  Trouble concentrating - - 0 - - 0 -  Moving slowly or fidgety/restless - - 3 - - 0 -  Suicidal thoughts - - 0 - - 0 -  PHQ-9 Score - - 11 - - 8 -  Some recent data might be hidden    Review of Systems  Constitutional: Positive for appetite change, chills and unexpected weight change.  HENT: Negative.   Eyes: Negative.   Respiratory: Positive for cough, shortness of breath and wheezing.   Cardiovascular: Positive for leg swelling.  Gastrointestinal: Positive for abdominal pain, constipation and nausea.  Endocrine: Negative.   Genitourinary: Negative.   Musculoskeletal:       Spasms  Skin: Positive for rash.    Allergic/Immunologic: Negative.   Neurological: Positive for dizziness, tremors, weakness and numbness.  Hematological: Negative.   Psychiatric/Behavioral: Positive for dysphoric mood. The patient is nervous/anxious.   All other systems reviewed and are negative.      Objective:   Physical Exam Constitutional: She is oriented to  person, place, and time. She doesn't appear to be in distress. Marland Kitchen  HENT:  Head: Normocephalic and atraumatic.  Neck: Normal range of motion. Neck supple.  She has tenderness along the lower cervical paraspinals into the traps. Her head is forward. The muscles are alos taut in this area.  Cardiovascular: RRR  Musculoskeletal: Right hand with mild tenderness along the ulnar wrist as well as the right EPL and 1st CMC. Tinel's and Phalen's test is normal.  Lumbar Paraspinal Tenderness: L2-4 most prominently. Pain worse today with extension and facet maneuvers although end range flexion produced pain too. SLR is equivocal. Gait is limited due to pain in her left ankle/knee and she utilizes a forearm crutch for support.  Left ankle remains tender to palpation, she is wearing orthosis.   Neurological: She is alert and oriented to person, place, and time. Motor exam intact except for pain inhibition. No focal sensory findings. DTR's appear to be 1-2+.  Skin: Skin is warm and dry.  Psychiatric: She is pleasant but remains anxious.   .       Assessment & Plan:  1.History of fibromyalgia.  . Continue Gabapentin             -maintain HEP as possible 2. Chronic low back pain.   -I reivewed her lumbar xrays at length. She has DDD at L2-3 and L5-S1 most predominantly. She also has associated facet disease as well which I also feel is contributing to her pain.  -need CT imaging at least for better clarity to help guide potential intervention             - continue hydrocodone 7.5mg  q8 prn #70. We will continue the opioid monitoring program, this consists of  regular clinic visits, examinations, urine drug screen, pill counts as well as use of [place] Controlled Substance Reporting System. [place] was reviewed today.   -reviewed posture and basic stretches today             -Continue Voltaren gel, Neurontin and Zanaflex  3. Rotator cuff syndrome./right bicipital tendonitis :has chronic right arm pain Continue with heat and exercise as tolerated. 4. Cervical Radiculopathy:   gabapentin  300mg  TID has been fairly effective and tolerable d5. Left ankle pain/prior fracture--ortho has nothing new to offer.  6. Right wrist pain: mild tendonitis/OA. Mild CTS?---although sx and exam is inconsistent.              -voltaren gel             -?EMG NCS.  7. Anxiety and depression. Continue Effexor and Klonopin.  15 minutes of face to face patient care time was spent during this visit with patient and daughter.  All questions were encouraged and answered.   F/U in 1 month with NP if not sooner for intervention.

## 2016-09-13 NOTE — Patient Instructions (Signed)
PLEASE FEEL FREE TO CALL OUR OFFICE WITH ANY PROBLEMS OR QUESTIONS [phone])     CONTINUE TO WORK ON STRETCHING AND GOOD POSTURE .

## 2016-09-14 DIAGNOSIS — I10 Essential (primary) hypertension: Secondary | ICD-10-CM | POA: Diagnosis not present

## 2016-09-14 DIAGNOSIS — R7301 Impaired fasting glucose: Secondary | ICD-10-CM | POA: Diagnosis not present

## 2016-09-14 DIAGNOSIS — E782 Mixed hyperlipidemia: Secondary | ICD-10-CM | POA: Diagnosis not present

## 2016-09-14 DIAGNOSIS — E039 Hypothyroidism, unspecified: Secondary | ICD-10-CM | POA: Diagnosis not present

## 2016-09-14 DIAGNOSIS — R1310 Dysphagia, unspecified: Secondary | ICD-10-CM | POA: Diagnosis not present

## 2016-09-14 DIAGNOSIS — K219 Gastro-esophageal reflux disease without esophagitis: Secondary | ICD-10-CM | POA: Diagnosis not present

## 2016-09-14 DIAGNOSIS — R002 Palpitations: Secondary | ICD-10-CM | POA: Diagnosis not present

## 2016-09-20 DIAGNOSIS — R251 Tremor, unspecified: Secondary | ICD-10-CM | POA: Diagnosis not present

## 2016-09-20 DIAGNOSIS — I209 Angina pectoris, unspecified: Secondary | ICD-10-CM | POA: Diagnosis not present

## 2016-09-20 DIAGNOSIS — E039 Hypothyroidism, unspecified: Secondary | ICD-10-CM | POA: Diagnosis not present

## 2016-09-20 DIAGNOSIS — Z6833 Body mass index (BMI) 33.0-33.9, adult: Secondary | ICD-10-CM | POA: Diagnosis not present

## 2016-09-20 DIAGNOSIS — G3184 Mild cognitive impairment, so stated: Secondary | ICD-10-CM | POA: Diagnosis not present

## 2016-09-20 DIAGNOSIS — E669 Obesity, unspecified: Secondary | ICD-10-CM | POA: Diagnosis not present

## 2016-09-20 DIAGNOSIS — R69 Illness, unspecified: Secondary | ICD-10-CM | POA: Diagnosis not present

## 2016-09-20 DIAGNOSIS — M797 Fibromyalgia: Secondary | ICD-10-CM | POA: Diagnosis not present

## 2016-09-20 DIAGNOSIS — N3011 Interstitial cystitis (chronic) with hematuria: Secondary | ICD-10-CM | POA: Diagnosis not present

## 2016-09-20 DIAGNOSIS — Z7982 Long term (current) use of aspirin: Secondary | ICD-10-CM | POA: Diagnosis not present

## 2016-09-20 DIAGNOSIS — G4700 Insomnia, unspecified: Secondary | ICD-10-CM | POA: Diagnosis not present

## 2016-09-20 DIAGNOSIS — E119 Type 2 diabetes mellitus without complications: Secondary | ICD-10-CM | POA: Diagnosis not present

## 2016-09-20 DIAGNOSIS — K219 Gastro-esophageal reflux disease without esophagitis: Secondary | ICD-10-CM | POA: Diagnosis not present

## 2016-09-20 DIAGNOSIS — G709 Myoneural disorder, unspecified: Secondary | ICD-10-CM | POA: Diagnosis not present

## 2016-09-20 DIAGNOSIS — Z0000 Encounter for general adult medical examination without abnormal findings: Secondary | ICD-10-CM | POA: Diagnosis not present

## 2016-09-20 DIAGNOSIS — K08109 Complete loss of teeth, unspecified cause, unspecified class: Secondary | ICD-10-CM | POA: Diagnosis not present

## 2016-09-20 DIAGNOSIS — R002 Palpitations: Secondary | ICD-10-CM | POA: Diagnosis not present

## 2016-09-20 DIAGNOSIS — E785 Hyperlipidemia, unspecified: Secondary | ICD-10-CM | POA: Diagnosis not present

## 2016-09-27 DIAGNOSIS — R002 Palpitations: Secondary | ICD-10-CM | POA: Diagnosis not present

## 2016-09-27 DIAGNOSIS — I10 Essential (primary) hypertension: Secondary | ICD-10-CM | POA: Diagnosis not present

## 2016-10-05 DIAGNOSIS — R002 Palpitations: Secondary | ICD-10-CM | POA: Diagnosis not present

## 2016-10-05 DIAGNOSIS — I517 Cardiomegaly: Secondary | ICD-10-CM | POA: Diagnosis not present

## 2016-10-05 DIAGNOSIS — I519 Heart disease, unspecified: Secondary | ICD-10-CM | POA: Diagnosis not present

## 2016-10-13 DIAGNOSIS — J029 Acute pharyngitis, unspecified: Secondary | ICD-10-CM | POA: Diagnosis not present

## 2016-10-13 DIAGNOSIS — J209 Acute bronchitis, unspecified: Secondary | ICD-10-CM | POA: Diagnosis not present

## 2016-10-17 ENCOUNTER — Encounter: Payer: Self-pay | Admitting: Registered Nurse

## 2016-10-17 ENCOUNTER — Encounter: Payer: Medicare HMO | Attending: Physical Medicine and Rehabilitation | Admitting: Registered Nurse

## 2016-10-17 ENCOUNTER — Telehealth: Payer: Self-pay | Admitting: Registered Nurse

## 2016-10-17 VITALS — BP 143/88 | HR 81 | Resp 14

## 2016-10-17 DIAGNOSIS — M47817 Spondylosis without myelopathy or radiculopathy, lumbosacral region: Secondary | ICD-10-CM | POA: Diagnosis not present

## 2016-10-17 DIAGNOSIS — Z79899 Other long term (current) drug therapy: Secondary | ICD-10-CM | POA: Diagnosis not present

## 2016-10-17 DIAGNOSIS — M5412 Radiculopathy, cervical region: Secondary | ICD-10-CM

## 2016-10-17 DIAGNOSIS — Z5181 Encounter for therapeutic drug level monitoring: Secondary | ICD-10-CM | POA: Insufficient documentation

## 2016-10-17 DIAGNOSIS — R69 Illness, unspecified: Secondary | ICD-10-CM | POA: Diagnosis not present

## 2016-10-17 DIAGNOSIS — F418 Other specified anxiety disorders: Secondary | ICD-10-CM

## 2016-10-17 DIAGNOSIS — M546 Pain in thoracic spine: Secondary | ICD-10-CM | POA: Diagnosis not present

## 2016-10-17 DIAGNOSIS — M25531 Pain in right wrist: Secondary | ICD-10-CM

## 2016-10-17 DIAGNOSIS — M19011 Primary osteoarthritis, right shoulder: Secondary | ICD-10-CM | POA: Diagnosis not present

## 2016-10-17 DIAGNOSIS — M7711 Lateral epicondylitis, right elbow: Secondary | ICD-10-CM | POA: Diagnosis not present

## 2016-10-17 DIAGNOSIS — M47816 Spondylosis without myelopathy or radiculopathy, lumbar region: Secondary | ICD-10-CM

## 2016-10-17 DIAGNOSIS — M1288 Other specific arthropathies, not elsewhere classified, other specified site: Secondary | ICD-10-CM | POA: Diagnosis not present

## 2016-10-17 DIAGNOSIS — M542 Cervicalgia: Secondary | ICD-10-CM

## 2016-10-17 DIAGNOSIS — M797 Fibromyalgia: Secondary | ICD-10-CM

## 2016-10-17 DIAGNOSIS — G894 Chronic pain syndrome: Secondary | ICD-10-CM | POA: Diagnosis not present

## 2016-10-17 MED ORDER — HYDROCODONE-ACETAMINOPHEN 7.5-325 MG PO TABS: 1.0000 | ORAL_TABLET | Freq: Four times a day (QID) | ORAL | 0 refills | Status: DC | PRN

## 2016-10-17 NOTE — Telephone Encounter (Signed)
On 10/17/2016 the  [place] was reviewed no conflict was seen on the [place] with multiple prescribers. [name] [name] has a signed narcotic contract with our office. If there were any discrepancies this would have been reported to her physician.

## 2016-10-17 NOTE — Progress Notes (Signed)
Subjective:    Patient ID: [name] [name], female    DOB: December 19, 1945, 71 y.o.   MRN: [id number]  HPI: [name] [name] is a 71year old female who returns for follow up appointment for chronic pain and medication refill.She states her pain is located in her neck radiating into her right shoulder, right elbow,mid- lower back and left ankle.She rates her pain 5. Her current exercise regime is going to the [facility] twice a week for [place], walking and performing stretching exercises. Ms. Skilling last UDS was on 06/15/2016 it was consistent.   Pain Inventory Average Pain 8 Pain Right Now 5 My pain is constant, burning, stabbing and aching  In the last 24 hours, has pain interfered with the following? General activity 4 Relation with others 7 Enjoyment of life 9 What TIME of day is your pain at its worst? daytime and night Sleep (in general) Fair  Pain is worse with: walking, bending, standing and some activites Pain improves with: rest, heat/ice, therapy/exercise, medication and injections Relief from Meds: 7  Mobility use a cane how many minutes can you walk? 20 ability to climb steps?  yes do you drive?  yes  Function retired I need assistance with the following:  household duties and shopping  Neuro/Psych tremor dizziness depression anxiety loss of taste or smell  Prior Studies Any changes since last visit?  no  Physicians involved in your care Any changes since last visit?  no   Family History  Problem Relation Age of Onset  . Cancer      breast -- grandmother  . Cancer      lung -- uncles (3)  . Leukemia Sister   . Diabetes Father   . Coronary artery disease Father   . Diabetes Mother    Social History   Social History  . Marital status: Married    Spouse name: N/A  . Number of children: N/A  . Years of education: N/A   Social History Main Topics  . Smoking status: Never Smoker  . Smokeless tobacco: Never Used  . Alcohol use No  . Drug use: No  .  Sexual activity: Not Asked   Other Topics Concern  . None   Social History Narrative  . None   Past Surgical History:  Procedure Laterality Date  . CARDIAC CATHETERIZATION  01-17-2003  dr Darnell Level brodie   normal coronary arteries and LVF,  ef 60%  . EXCISION MORTON'S NEUROMA  2002   bilateral feet  . EXCISION OF BREAST BIOPSY Right 1990's   benign  . INTERSTIM IMPLANT PLACEMENT N/A 02/10/2015   Procedure: Barrie Lyme IMPLANT FIRST STAGE;  Surgeon: Bjorn Loser, MD;  Location[facility];  Service: Urology;  Laterality: N/A;  . INTERSTIM IMPLANT PLACEMENT N/A 02/10/2015   Procedure: Barrie Lyme IMPLANT SECOND STAGE;  Surgeon: Bjorn Loser, MD;  Location: [facility];  Service: Urology;  Laterality: N/A;  . LAPAROSCOPIC CHOLECYSTECTOMY  2000  . NEGATIVE SLEEP STUDY  2000   per pt  . ORIF ANKLE FRACTURE  01/10/2012   Procedure: OPEN REDUCTION INTERNAL FIXATION (ORIF) ANKLE FRACTURE;  Surgeon: Sanjuana Kava, MD;  Location: [facility];  Service: Orthopedics;  Laterality: Left;  . TOTAL THYROIDECTOMY  1985   benign tumor  . TRANSTHORACIC ECHOCARDIOGRAM  03-02-2009   normal echo,  ef 55-60%  . VAGINAL HYSTERECTOMY  1984  . WRIST SURGERY Left 2000   Past Medical History:  Diagnosis Date  . Anxiety   .  Cervical radiculopathy   . Chronic back pain    R > L  . Chronic neck pain   . Depression   . Dysphagia    functional --  takes small bites  . Fibromyalgia   . GERD (gastroesophageal reflux disease)   . Hemorrhoids   . History of ankle fracture 01/08/2012   Status post open reduction internal fixation of a left ankle trimalleolar fracture by Dr. Iona Hansen on 01/10/2012.    Marland Kitchen History of benign thyroid tumor   . History of diabetes mellitus, type II    was diet controlled -- per pt pcp she was no longer diabetic  . History of hiatal hernia   . History of TIA (transient ischemic attack)    02-27-2009  no residual  . HTN (hypertension)   .  Hyperlipidemia   . Hypothyroidism, postsurgical   . Lumbar radicular pain    R>L legs  . Migraine headache   . Mild intermittent asthma with allergic rhinitis without complication   . PONV (postoperative nausea and vomiting)   . Rotator cuff syndrome of right shoulder   . Urge urinary incontinence    refractory  . Wears glasses    BP (!) 143/88   Pulse 81   Resp 14   SpO2 96%   Opioid Risk Score:   Fall Risk Score:  `1  Depression screen PHQ 2/9  Depression screen [facility] 2/9 10/17/2016 09/13/2016 12/22/2015 10/19/2015 08/20/2015 06/15/2015 03/23/2015  Decreased Interest 3 3 0 0 0 0 1  Down, Depressed, Hopeless 3 3 0 1 1 0 3  PHQ - 2 Score 6 6 0 1 1 0 4  Altered sleeping - - - 2 - - 0  Tired, decreased energy - - - 2 - - 3  Change in appetite - - - 3 - - 1  Feeling bad or failure about yourself  - - - 0 - - 0  Trouble concentrating - - - 0 - - 0  Moving slowly or fidgety/restless - - - 3 - - 0  Suicidal thoughts - - - 0 - - 0  PHQ-9 Score - - - 11 - - 8  Some recent data might be hidden    Review of Systems  Constitutional: Positive for appetite change, chills and fever.  HENT: Negative.   Eyes: Negative.   Respiratory: Positive for cough and wheezing.   Cardiovascular: Positive for leg swelling.  Gastrointestinal: Positive for nausea.  Endocrine:       High blood sugar  Genitourinary: Negative.   Musculoskeletal: Negative.   Skin: Positive for rash.  Allergic/Immunologic: Negative.   Neurological: Positive for dizziness and tremors.  Hematological: Negative.   Psychiatric/Behavioral: Positive for dysphoric mood. The patient is nervous/anxious.   All other systems reviewed and are negative.      Objective:   Physical Exam  Constitutional: She is oriented to person, place, and time. She appears well-developed and well-nourished.  HENT:  Head: Normocephalic and atraumatic.  Neck: Normal range of motion. Neck supple.  Cardiovascular: Normal rate and regular rhythm.     Pulmonary/Chest: Effort normal and breath sounds normal.  Musculoskeletal:  Normal Muscle Bulk and Muscle Testing Reveals: Upper Extremities: Full ROM and Muscle Strength 5/5 Thoracic Paraspinal Tenderness: T-3-T-5 Lumbar Paraspinal Tenderness: L-3-L5 Lower Extremities: Full ROM and Muscle Strength 5/5 Arises from Table slowly Narrow Based Gait  Neurological: She is alert and oriented to person, place, and time.  Skin: Skin is warm and dry.  Psychiatric: She has a normal mood and affect.  Nursing note and vitals reviewed.         Assessment & Plan:  1.History of fibromyalgia. Continue Current Medication Regime and Exercise Regime. Continue Gabapentin. 10/17/2016 2. Chronic low back pain. Myofascial pain.: 10/17/2016 Refilled: Hydrocodone 7.5/325 mg one tablet every 6 hours as needed for pain #70.  We will continue the opioid monitoring program, this consists of regular clinic visits, examinations, urine drug screen, pill counts as well as use of [place] Controlled Substance Reporting System. Continue  Neurontin and Zanaflex  3.Right epicondylitis : Alternate Heat/ Ice Therapy. Call office if pain intensifies, she verbalizes understanding.  4. Cervical Radiculopathy: ContinueGabapentin. 10/17/2016 5. Anxiety and depression. Continue Effexor and Klonopin. 10/17/2016  20 minutes of face to face patient care time was spent during this visit. All questions were encouraged and answered.  F/U in 1 month

## 2016-11-04 ENCOUNTER — Other Ambulatory Visit: Payer: Self-pay | Admitting: *Deleted

## 2016-11-04 DIAGNOSIS — F418 Other specified anxiety disorders: Secondary | ICD-10-CM

## 2016-11-04 DIAGNOSIS — M797 Fibromyalgia: Secondary | ICD-10-CM

## 2016-11-04 MED ORDER — VENLAFAXINE HCL 100 MG PO TABS: ORAL_TABLET | ORAL | 2 refills | Status: DC

## 2016-11-10 ENCOUNTER — Encounter: Payer: Self-pay | Admitting: Registered Nurse

## 2016-11-10 ENCOUNTER — Encounter: Payer: Medicare HMO | Attending: Physical Medicine and Rehabilitation | Admitting: Registered Nurse

## 2016-11-10 VITALS — BP 149/87 | HR 77

## 2016-11-10 DIAGNOSIS — Z79899 Other long term (current) drug therapy: Secondary | ICD-10-CM

## 2016-11-10 DIAGNOSIS — M5412 Radiculopathy, cervical region: Secondary | ICD-10-CM | POA: Diagnosis not present

## 2016-11-10 DIAGNOSIS — M47817 Spondylosis without myelopathy or radiculopathy, lumbosacral region: Secondary | ICD-10-CM

## 2016-11-10 DIAGNOSIS — Z5181 Encounter for therapeutic drug level monitoring: Secondary | ICD-10-CM | POA: Diagnosis not present

## 2016-11-10 DIAGNOSIS — M546 Pain in thoracic spine: Secondary | ICD-10-CM

## 2016-11-10 DIAGNOSIS — M542 Cervicalgia: Secondary | ICD-10-CM | POA: Diagnosis not present

## 2016-11-10 DIAGNOSIS — M797 Fibromyalgia: Secondary | ICD-10-CM

## 2016-11-10 DIAGNOSIS — M25531 Pain in right wrist: Secondary | ICD-10-CM | POA: Diagnosis not present

## 2016-11-10 DIAGNOSIS — R69 Illness, unspecified: Secondary | ICD-10-CM | POA: Diagnosis not present

## 2016-11-10 DIAGNOSIS — M19011 Primary osteoarthritis, right shoulder: Secondary | ICD-10-CM | POA: Insufficient documentation

## 2016-11-10 DIAGNOSIS — F418 Other specified anxiety disorders: Secondary | ICD-10-CM

## 2016-11-10 DIAGNOSIS — M47816 Spondylosis without myelopathy or radiculopathy, lumbar region: Secondary | ICD-10-CM

## 2016-11-10 DIAGNOSIS — M4696 Unspecified inflammatory spondylopathy, lumbar region: Secondary | ICD-10-CM

## 2016-11-10 DIAGNOSIS — G894 Chronic pain syndrome: Secondary | ICD-10-CM | POA: Diagnosis not present

## 2016-11-10 MED ORDER — CLONAZEPAM 1 MG PO TABS: ORAL_TABLET | ORAL | 1 refills | Status: DC

## 2016-11-10 MED ORDER — HYDROCODONE-ACETAMINOPHEN 7.5-325 MG PO TABS: 1.0000 | ORAL_TABLET | Freq: Four times a day (QID) | ORAL | 0 refills | Status: DC | PRN

## 2016-11-10 NOTE — Progress Notes (Signed)
Subjective:    Patient ID: [name] [name], female    DOB: Jan 12, 1946, 71 y.o.   MRN: [id number]  HPI: Ms. MAURICE RAMSEUR is a 71year old female who returns for follow up appointmentfor chronic pain and medication refill.She states her pain is located in her neck radiating into her right shoulder, right elbow, right arm and wrist with tingling. States she is compliant with her Gabapentin.Also has left hip pain radiating into her left lower extremity.She rates her pain 6.Her current exercise regime is walking and performing stretching exercises.  Ms. Ferg last UDS was on 06/15/2016 it was consistent, UDS ordered today.   Pain Inventory Average Pain 6 Pain Right Now 6 My pain is sharp, dull and aching  In the last 24 hours, has pain interfered with the following? General activity 6 Relation with others 7 Enjoyment of life 9 What TIME of day is your pain at its worst? night Sleep (in general) NA  Pain is worse with: walking, bending, standing and some activites Pain improves with: rest, heat/ice, medication and injections Relief from Meds: 6  Mobility use a cane do you drive?  no  Function Do you have any goals in this area?  yes  Neuro/Psych No problems in this area  Prior Studies Any changes since last visit?  no  Physicians involved in your care Any changes since last visit?  no   Family History  Problem Relation Age of Onset  . Cancer      breast -- grandmother  . Cancer      lung -- uncles (3)  . Leukemia Sister   . Diabetes Father   . Coronary artery disease Father   . Diabetes Mother    Social History   Social History  . Marital status: Married    Spouse name: N/A  . Number of children: N/A  . Years of education: N/A   Social History Main Topics  . Smoking status: Never Smoker  . Smokeless tobacco: Never Used  . Alcohol use No  . Drug use: No  . Sexual activity: Not Asked   Other Topics Concern  . None   Social History Narrative  . None     Past Surgical History:  Procedure Laterality Date  . CARDIAC CATHETERIZATION  01-17-2003  dr Darnell Level brodie   normal coronary arteries and LVF,  ef 60%  . EXCISION MORTON'S NEUROMA  2002   bilateral feet  . EXCISION OF BREAST BIOPSY Right 1990's   benign  . INTERSTIM IMPLANT PLACEMENT N/A 02/10/2015   Procedure: Barrie Lyme IMPLANT FIRST STAGE;  Surgeon: Bjorn Loser, MD;  Location: [facility];  Service: Urology;  Laterality: N/A;  . INTERSTIM IMPLANT PLACEMENT N/A 02/10/2015   Procedure: Barrie Lyme IMPLANT SECOND STAGE;  Surgeon: Bjorn Loser, MD;  Location: [facility];  Service: Urology;  Laterality: N/A;  . LAPAROSCOPIC CHOLECYSTECTOMY  2000  . NEGATIVE SLEEP STUDY  2000   per pt  . ORIF ANKLE FRACTURE  01/10/2012   Procedure: OPEN REDUCTION INTERNAL FIXATION (ORIF) ANKLE FRACTURE;  Surgeon: Sanjuana Kava, MD;  Location: [facility];  Service: Orthopedics;  Laterality: Left;  . TOTAL THYROIDECTOMY  1985   benign tumor  . TRANSTHORACIC ECHOCARDIOGRAM  03-02-2009   normal echo,  ef 55-60%  . VAGINAL HYSTERECTOMY  1984  . WRIST SURGERY Left 2000   Past Medical History:  Diagnosis Date  . Anxiety   . Cervical radiculopathy   . Chronic back pain  R > L  . Chronic neck pain   . Depression   . Dysphagia    functional --  takes small bites  . Fibromyalgia   . GERD (gastroesophageal reflux disease)   . Hemorrhoids   . History of ankle fracture 01/08/2012   Status post open reduction internal fixation of a left ankle trimalleolar fracture by Dr. Iona Hansen on 01/10/2012.    Marland Kitchen History of benign thyroid tumor   . History of diabetes mellitus, type II    was diet controlled -- per pt pcp she was no longer diabetic  . History of hiatal hernia   . History of TIA (transient ischemic attack)    02-27-2009  no residual  . HTN (hypertension)   . Hyperlipidemia   . Hypothyroidism, postsurgical   . Lumbar radicular pain    R>L legs  . Migraine headache    . Mild intermittent asthma with allergic rhinitis without complication   . PONV (postoperative nausea and vomiting)   . Rotator cuff syndrome of right shoulder   . Urge urinary incontinence    refractory  . Wears glasses    BP (!) 150/80   Pulse 77   SpO2 96%   Opioid Risk Score:   Fall Risk Score:  `1  Depression screen PHQ 2/9  Depression screen [facility] 2/9 11/10/2016 10/17/2016 09/13/2016 12/22/2015 10/19/2015 08/20/2015 06/15/2015  Decreased Interest 3 3 3  0 0 0 0  Down, Depressed, Hopeless 3 3 3  0 1 1 0  PHQ - 2 Score 6 6 6  0 1 1 0  Altered sleeping - - - - 2 - -  Tired, decreased energy - - - - 2 - -  Change in appetite - - - - 3 - -  Feeling bad or failure about yourself  - - - - 0 - -  Trouble concentrating - - - - 0 - -  Moving slowly or fidgety/restless - - - - 3 - -  Suicidal thoughts - - - - 0 - -  PHQ-9 Score - - - - 11 - -  Some recent data might be hidden   Review of Systems  Constitutional: Positive for appetite change and unexpected weight change.  HENT: Negative.   Eyes: Negative.   Respiratory: Positive for cough and wheezing.   Cardiovascular: Positive for leg swelling.  Gastrointestinal: Positive for constipation and nausea.  Endocrine: Negative.   Genitourinary: Negative.   Musculoskeletal: Negative.   Skin: Positive for rash.  Allergic/Immunologic: Negative.   Neurological: Negative.   Hematological: Negative.   Psychiatric/Behavioral: Positive for dysphoric mood.  All other systems reviewed and are negative.      Objective:   Physical Exam  Constitutional: She appears well-developed.  Neck:  Cervical Paraspinal Tenderness: C-5-C-6 Mainly Right Side  Musculoskeletal:  Normal Muscle Bulk and Muscle Testing Reveals:  Upper Extremities: Full ROM and Muscle Strength 5/5 Right AC Joint Tenderness Thoracic Paraspinal Tenderness: T-1-T-7 Mainly Right Side Lower Extremities: Full ROM and Muscle Strength 5/5 Arises from Table with ease Narrow Based  Gait   Nursing note and vitals reviewed.         Assessment & Plan:  1.History of fibromyalgia. Continue Current Medication Regime and Exercise Regime. Continue Gabapentin. 11/10/2016 2. Chronic low back pain: Continue HEP as Tolerated. Continue to Monitor Refilled: Hydrocodone 7.5/325mg  one tablet every 6hours as needed for pain #70.  We will continue the opioid monitoring program, this consists of regular clinic visits, examinations, urine drug screen, pill  counts as well as use of [place] Controlled Substance Reporting System. Continue Neurontin and Zanaflex  3. Cervical Radiculopathy: ContinueGabapentin. 11/10/2016 5. Anxiety and depression. Continue Effexor and Klonopin. 11/10/2016  20 minutes of face to face patient care time was spent during this visit. All questions were encouraged and answered.  F/U in 53month

## 2016-11-16 LAB — TOXASSURE SELECT,+ANTIDEPR,UR

## 2016-11-17 ENCOUNTER — Telehealth: Payer: Self-pay | Admitting: *Deleted

## 2016-11-17 NOTE — Telephone Encounter (Signed)
Urine drug screen for this encounter is consistent for prescribed medication 

## 2016-11-25 ENCOUNTER — Other Ambulatory Visit: Payer: Self-pay | Admitting: Registered Nurse

## 2016-11-25 DIAGNOSIS — M47816 Spondylosis without myelopathy or radiculopathy, lumbar region: Secondary | ICD-10-CM

## 2016-11-25 DIAGNOSIS — M797 Fibromyalgia: Secondary | ICD-10-CM

## 2016-11-25 DIAGNOSIS — M546 Pain in thoracic spine: Secondary | ICD-10-CM

## 2016-11-25 DIAGNOSIS — M75100 Unspecified rotator cuff tear or rupture of unspecified shoulder, not specified as traumatic: Secondary | ICD-10-CM

## 2016-12-13 DIAGNOSIS — I77810 Thoracic aortic ectasia: Secondary | ICD-10-CM | POA: Diagnosis not present

## 2016-12-13 DIAGNOSIS — I10 Essential (primary) hypertension: Secondary | ICD-10-CM | POA: Diagnosis not present

## 2016-12-14 ENCOUNTER — Encounter: Payer: Self-pay | Admitting: Physical Medicine & Rehabilitation

## 2016-12-14 ENCOUNTER — Encounter: Payer: Medicare HMO | Attending: Physical Medicine and Rehabilitation | Admitting: Physical Medicine & Rehabilitation

## 2016-12-14 VITALS — BP 150/89 | HR 80 | Resp 14

## 2016-12-14 DIAGNOSIS — M4696 Unspecified inflammatory spondylopathy, lumbar region: Secondary | ICD-10-CM

## 2016-12-14 DIAGNOSIS — M47817 Spondylosis without myelopathy or radiculopathy, lumbosacral region: Secondary | ICD-10-CM

## 2016-12-14 DIAGNOSIS — M19011 Primary osteoarthritis, right shoulder: Secondary | ICD-10-CM | POA: Diagnosis not present

## 2016-12-14 DIAGNOSIS — Z79899 Other long term (current) drug therapy: Secondary | ICD-10-CM | POA: Diagnosis not present

## 2016-12-14 DIAGNOSIS — M47816 Spondylosis without myelopathy or radiculopathy, lumbar region: Secondary | ICD-10-CM

## 2016-12-14 DIAGNOSIS — Z5181 Encounter for therapeutic drug level monitoring: Secondary | ICD-10-CM | POA: Insufficient documentation

## 2016-12-14 DIAGNOSIS — M546 Pain in thoracic spine: Secondary | ICD-10-CM

## 2016-12-14 DIAGNOSIS — M797 Fibromyalgia: Secondary | ICD-10-CM

## 2016-12-14 MED ORDER — DICLOFENAC SODIUM 1 % TD GEL: 1.0000 "application " | Freq: Three times a day (TID) | TRANSDERMAL | 4 refills | Status: DC

## 2016-12-14 MED ORDER — HYDROCODONE-ACETAMINOPHEN 7.5-325 MG PO TABS: 1.0000 | ORAL_TABLET | Freq: Four times a day (QID) | ORAL | 0 refills | Status: DC | PRN

## 2016-12-14 NOTE — Progress Notes (Signed)
Subjective:    Patient ID: [name] [name], female    DOB: [birth year], 71 y.o.   MRN: [id number]  HPI   [name] is here in follow up of her chronic pain. She has had increased pain in her right wrist and elbow. She is finding it more difficult to perform ADL's and use the hand and elbow. Sometimes the pain radiates to her shoulder. She reports increased difficulty sleeping. Her left hip and low back seem to be more of a problem too  She is taking hydrocodone for pain which seems to help. She is out of voltaren gel which she has found helpful for joint pain     Pain Inventory Average Pain 8 Pain Right Now 7 My pain is constant, burning, tingling and aching  In the last 24 hours, has pain interfered with the following? General activity 6 Relation with others 6 Enjoyment of life 9 What TIME of day is your pain at its worst? daytime, evening, night Sleep (in general) Fair  Pain is worse with: standing and some activites Pain improves with: rest, heat/ice, medication and injections Relief from Meds: 8  Mobility walk with assistance use a cane how many minutes can you walk? 20  Function retired Do you have any goals in this area?  yes  Neuro/Psych weakness numbness tingling spasms dizziness depression anxiety  Prior Studies Any changes since last visit?  no  Physicians involved in your care Any changes since last visit?  no   Family History  Problem Relation Age of Onset  . Cancer Unknown        breast -- grandmother  . Cancer Unknown        lung -- uncles (3)  . Leukemia Sister   . Diabetes Father   . Coronary artery disease Father   . Diabetes Mother    Social History   Social History  . Marital status: Married    Spouse name: N/A  . Number of children: N/A  . Years of education: N/A   Social History Main Topics  . Smoking status: Never Smoker  . Smokeless tobacco: Never Used  . Alcohol use No  . Drug use: No  . Sexual activity: Not Asked    Other Topics Concern  . None   Social History Narrative  . None   Past Surgical History:  Procedure Laterality Date  . CARDIAC CATHETERIZATION  01-17-2003  dr Darnell Level brodie   normal coronary arteries and LVF,  ef 60%  . EXCISION MORTON'S NEUROMA  2002   bilateral feet  . EXCISION OF BREAST BIOPSY Right 1990's   benign  . INTERSTIM IMPLANT PLACEMENT N/A 02/10/2015   Procedure: Barrie Lyme IMPLANT FIRST STAGE;  Surgeon: Bjorn Loser, MD;  Location: [facility];  Service: Urology;  Laterality: N/A;  . INTERSTIM IMPLANT PLACEMENT N/A 02/10/2015   Procedure: Barrie Lyme IMPLANT SECOND STAGE;  Surgeon: Bjorn Loser, MD;  Location: [facility];  Service: Urology;  Laterality: N/A;  . LAPAROSCOPIC CHOLECYSTECTOMY  2000  . NEGATIVE SLEEP STUDY  2000   per pt  . ORIF ANKLE FRACTURE  01/10/2012   Procedure: OPEN REDUCTION INTERNAL FIXATION (ORIF) ANKLE FRACTURE;  Surgeon: Sanjuana Kava, MD;  Location: [facility];  Service: Orthopedics;  Laterality: Left;  . TOTAL THYROIDECTOMY  1985   benign tumor  . TRANSTHORACIC ECHOCARDIOGRAM  03-02-2009   normal echo,  ef 55-60%  . VAGINAL HYSTERECTOMY  1984  . WRIST SURGERY Left 2000   Past Medical History:  Diagnosis Date  . Anxiety   . Cervical radiculopathy   . Chronic back pain    R > L  . Chronic neck pain   . Depression   . Dysphagia    functional --  takes small bites  . Fibromyalgia   . GERD (gastroesophageal reflux disease)   . Hemorrhoids   . History of ankle fracture 01/08/2012   Status post open reduction internal fixation of a left ankle trimalleolar fracture by Dr. Iona Hansen on 01/10/2012.    Marland Kitchen History of benign thyroid tumor   . History of diabetes mellitus, type II    was diet controlled -- per pt pcp she was no longer diabetic  . History of hiatal hernia   . History of TIA (transient ischemic attack)    02-27-2009  no residual  . HTN (hypertension)   . Hyperlipidemia   . Hypothyroidism,  postsurgical   . Lumbar radicular pain    R>L legs  . Migraine headache   . Mild intermittent asthma with allergic rhinitis without complication   . PONV (postoperative nausea and vomiting)   . Rotator cuff syndrome of right shoulder   . Urge urinary incontinence    refractory  . Wears glasses    BP (!) 150/89 (BP Location: Right Arm, Patient Position: Sitting, Cuff Size: Large)   Pulse 80   Resp 14   SpO2 98%   Opioid Risk Score:   Fall Risk Score:  `1  Depression screen PHQ 2/9  Depression screen [facility] 2/9 11/10/2016 10/17/2016 09/13/2016 12/22/2015 10/19/2015 08/20/2015 06/15/2015  Decreased Interest 3 3 3  0 0 0 0  Down, Depressed, Hopeless 3 3 3  0 1 1 0  PHQ - 2 Score 6 6 6  0 1 1 0  Altered sleeping - - - - 2 - -  Tired, decreased energy - - - - 2 - -  Change in appetite - - - - 3 - -  Feeling bad or failure about yourself  - - - - 0 - -  Trouble concentrating - - - - 0 - -  Moving slowly or fidgety/restless - - - - 3 - -  Suicidal thoughts - - - - 0 - -  PHQ-9 Score - - - - 11 - -  Some recent data might be hidden    Review of Systems  Constitutional: Positive for appetite change, chills, diaphoresis, fever and unexpected weight change.  HENT: Negative.   Eyes: Negative.   Respiratory: Positive for cough and shortness of breath.   Cardiovascular: Positive for leg swelling.  Gastrointestinal: Positive for abdominal pain, constipation and nausea.  Endocrine: Negative.   Musculoskeletal: Positive for arthralgias, back pain, myalgias and neck pain.       Spasms   Skin: Positive for rash.  Neurological: Positive for dizziness, weakness and numbness.       Tingling   Psychiatric/Behavioral: Positive for dysphoric mood. The patient is nervous/anxious.        Objective:   Physical Exam  Constitutional: She is oriented to person, place, and time. She doesn't appear to be in distress. Marland Kitchen  HENT:  Head: Normocephalic and atraumatic.  Neck: Normal range of motion. Neck supple.  Fair ROM today  Cardiovascular: RRR Musculoskeletal: right wrist with generalized tenderness. Pain along common extensor tendon right elbow. Lumbar Paraspinal Tenderness: L2-4 most prominently. Decreased ROM.  SLR is equivocal. Gait is limited due to pain in her left ankle/knee and she utilizes a forearm crutch for support.  Left ankle remains tender to palpation, she is wearing orthosis.   Neurological: She is alert and oriented to person, place, and time. Motor exam intact except for pain inhibition. No focal sensory findings. DTR's 1+.  Skin: Skin is warm and dry.  Psychiatric: She is pleasant but remains anxious.  .       Assessment & Plan:  1.History of fibromyalgia. . Continue Gabapentin -maintain HEP as possible 2. Chronic low back pain.              - She has DDD at L2-3 and L5-S1 most predominantly. She also has associated facet disease              -need CT imaging at least for better clarity to help guide potential intervention - continue hydrocodone 7.5mg  q8 prn #70. We will continue the opioid monitoring program, this consists of regular clinic visits, examinations, urine drug screen, pill counts as well as use of [place] Controlled Substance Reporting System. [place] was reviewed today.              -informed her about posture and HEP -Continue Voltaren gel, Neurontin and Zanaflex  3. Rotator cuff syndrome./right bicipital tendonitis :has chronic right arm pain Continue with heat and exercise as tolerated. 4. Cervical Radiculopathy:   gabapentin  300mg  TID has been fairly effective and tolerable 5. Left ankle pain/prior fracture--ortho has nothing new to offer.  6. Right wrist pain: mild tendonitis/OA. Mild CTS?---although sx and exam is inconsistent.  -voltaren gel  7. Anxiety and depression. Continue Effexor and Klonopin. 8. Insomnia: I won't increase klonopin.   -suggested use of melatonin  -consider  sleep study 9. Lateral epicondylitis right  -exercises were provided, Discussed ice/forearm band  -can inject if needed  47minutes of face to face patient care time was spent during this visit with patient and daughter. All questions were encouraged and answered.   F/U in 15month with NP

## 2016-12-14 NOTE — Patient Instructions (Addendum)
PLEASE FEEL FREE TO CALL OUR OFFICE WITH ANY PROBLEMS OR QUESTIONS ([phone])  TRY MELATONIN FOR SLEEP ---YOU CAN PURCHASE THIS OVER THE COUNTER  CONSIDER SLEEP STUDY   Tennis Elbow Tennis elbow (lateral epicondylitis) is inflammation of the outer tendons of your forearm close to your elbow. Your tendons attach your muscles to your bones. The outer tendons of your forearm are used to extend your wrist, and they attach on the outside part of your elbow. Tennis elbow is often found in people who play tennis, but anyone may get the condition from repeatedly extending the wrist or turning the forearm. What are the causes? This condition is caused by repeatedly extending your wrist and using your hands. It can result from sports or work that requires repetitive forearm movements. Tennis elbow may also be caused by an injury. What increases the risk? You have a higher risk of developing tennis elbow if you play tennis or another racquet sport. You also have a higher risk if you frequently use your hands for work. This condition is also more likely to develop in:  Musicians.  Carpenters, painters, and plumbers.  Cooks.  Cashiers.  People who work in [employer].  Architect workers.  Butchers.  People who use computers.  What are the signs or symptoms? Symptoms of this condition include:  Pain and tenderness in your forearm and the outer part of your elbow. You may only feel the pain when you use your arm, or you may feel it even when you are not using your arm.  A burning feeling that runs from your elbow through your arm.  Weak grip in your hands.  How is this diagnosed? This condition may be diagnosed by medical history and physical exam. You may also have other tests, including:  X-rays.  MRI.  How is this treated? Your health care provider will recommend lifestyle adjustments, such as resting and icing your arm. Treatment may also include:  Medicines for  inflammation. This may include shots of cortisone if your pain continues.  Physical therapy. This may include massage or exercises.  An elbow brace.  Surgery may eventually be recommended if your pain does not go away with treatment. Follow these instructions at home: Activity  Rest your elbow and wrist as directed by your health care provider. Try to avoid any activities that caused the problem until your health care provider says that you can do them again.  If a physical therapist teaches you exercises, do all of them as directed.  If you lift an object, lift it with your palm facing upward. This lowers the stress on your elbow. Lifestyle  If your tennis elbow is caused by sports, check your equipment and make sure that: ? You are using it correctly. ? It is the best fit for you.  If your tennis elbow is caused by work, take breaks frequently, if you are able. Talk with your manager about how to best perform tasks in a way that is safe. ? If your tennis elbow is caused by computer use, talk with your manager about any changes that can be made to your work environment. General instructions  If directed, apply ice to the painful area: ? Put ice in a plastic bag. ? Place a towel between your skin and the bag. ? Leave the ice on for 20 minutes, 2-3 times per day.  Take medicines only as directed by your health care provider.  If you were given a brace, wear it as directed  by your health care provider.  Keep all follow-up visits as directed by your health care provider. This is important. Contact a health care provider if:  Your pain does not get better with treatment.  Your pain gets worse.  You have numbness or weakness in your forearm, hand, or fingers. This information is not intended to replace advice given to you by your health care provider. Make sure you discuss any questions you have with your health care provider. Document Released: 06/27/2005 Document Revised:  02/25/2016 Document Reviewed: 06/23/2014 Elsevier Interactive Patient Education  2018 [place] Ask your health care provider which exercises are safe for you. Do exercises exactly as told by your health care provider and adjust them as directed. It is normal to feel mild stretching, pulling, tightness, or discomfort as you do these exercises, but you should stop right away if you feel sudden pain or your pain gets worse. Do not begin these exercises until told by your health care provider. Stretching and range of motion exercises These exercises warm up your muscles and joints and improve the movement and flexibility of your elbow. These exercises also help to relieve pain, numbness, and tingling. Exercise A: Wrist extensor stretch 1. Extend your left / right elbow with your fingers pointing down. 2. Gently pull the palm of your left / right hand toward you until you feel a gentle stretch on the top of your forearm. 3. To increase the stretch, push your left / right hand toward the outer edge or pinkie side of your forearm. 4. Hold this position for __________ seconds. Repeat __________ times. Complete this exercise __________ times a day. If directed by your health care provider, repeat this stretch except do it with a bent elbow this time. Exercise B: Wrist flexor stretch  1. Extend your left / right elbow and turn your palm upward. 2. Gently pull your left / right palm and fingertips back so your wrist extends and your fingers point more toward the ground. 3. You should feel a gentle stretch on the inside of your forearm. 4. Hold this position for __________ seconds. Repeat __________ times. Complete this exercise __________ times a day. If directed by your health care provider, repeat this stretch except do it with a bent elbow this time. Strengthening exercises These exercises build strength and endurance in your elbow. Endurance is the ability to use your  muscles for a long time, even after they get tired. Exercise C: Wrist extensors  1. Sit with your left / right forearm palm-down and fully supported on a table or countertop. Your elbow should be resting below the height of your shoulder. 2. Let your left / right wrist extend over the edge of the surface. 3. Loosely hold a __________ weight or a piece of rubber exercise band or tubing in your left / right hand. Slowly curl your left / right hand up toward your forearm. If you are using band or tubing, hold the band or tubing in place with your other hand to provide resistance. 4. Hold this position for __________ seconds. 5. Slowly return to the starting position. Repeat __________ times. Complete this exercise __________ times a day. Exercise D: Radial deviators  1. Stand with a __________ weight in your left / righthand. Or, sit while holding a rubber exercise band or tubing with your other arm supported on a table or countertop. Position your hand so your thumb is on top. 2. Raise your hand upward  in front of you so your thumb travels toward your forearm, or pull up on the rubber tubing. 3. Hold this position for __________ seconds. 4. Slowly return to the starting position. Repeat __________ times. Complete this exercise __________ times a day. Exercise E: Eccentric wrist extensors 1. Sit with your left / right forearm palm-down and fully supported on a table or countertop. Your elbow should be resting below the height of your shoulder. 2. If told by your health care provider, hold a __________ weight in your hand. 3. Let your left / right wrist extend over the edge of the surface. 4. Use your other hand to lift up your left / right hand toward your forearm. Keep your forearm on the table. 5. Using only the muscles in your left / right hand, slowly lower your hand back down to the starting position. Repeat __________ times. Complete this exercise __________ times a day. This information  is not intended to replace advice given to you by your health care provider. Make sure you discuss any questions you have with your health care provider. Document Released: 06/27/2005 Document Revised: 03/02/2016 Document Reviewed: 03/26/2015 Elsevier Interactive Patient Education  [employer].

## 2016-12-27 ENCOUNTER — Telehealth: Payer: Self-pay | Admitting: *Deleted

## 2016-12-27 NOTE — Telephone Encounter (Signed)
[name] [name] called asking if [name] [name] could give her something else because her "nerves are really tore up".  I called her back and explained that she was already on clonazepam and she can not give her additional medication. [name] [name] recommends that she seek counseling as she and Dr Naaman Plummer  have discussed or see if her primary care can assist her with finding a provider who can help her manage her stress. [name] [name] will contact them.

## 2017-01-19 ENCOUNTER — Encounter: Payer: Self-pay | Admitting: Registered Nurse

## 2017-01-19 ENCOUNTER — Encounter: Payer: Medicare HMO | Attending: Physical Medicine and Rehabilitation | Admitting: Registered Nurse

## 2017-01-19 VITALS — BP 144/87 | HR 93

## 2017-01-19 DIAGNOSIS — Z79899 Other long term (current) drug therapy: Secondary | ICD-10-CM

## 2017-01-19 DIAGNOSIS — R69 Illness, unspecified: Secondary | ICD-10-CM | POA: Diagnosis not present

## 2017-01-19 DIAGNOSIS — Z5181 Encounter for therapeutic drug level monitoring: Secondary | ICD-10-CM

## 2017-01-19 DIAGNOSIS — M47816 Spondylosis without myelopathy or radiculopathy, lumbar region: Secondary | ICD-10-CM

## 2017-01-19 DIAGNOSIS — M797 Fibromyalgia: Secondary | ICD-10-CM

## 2017-01-19 DIAGNOSIS — M546 Pain in thoracic spine: Secondary | ICD-10-CM

## 2017-01-19 DIAGNOSIS — M542 Cervicalgia: Secondary | ICD-10-CM

## 2017-01-19 DIAGNOSIS — M47817 Spondylosis without myelopathy or radiculopathy, lumbosacral region: Secondary | ICD-10-CM

## 2017-01-19 DIAGNOSIS — F418 Other specified anxiety disorders: Secondary | ICD-10-CM | POA: Diagnosis not present

## 2017-01-19 DIAGNOSIS — M4696 Unspecified inflammatory spondylopathy, lumbar region: Secondary | ICD-10-CM | POA: Diagnosis not present

## 2017-01-19 DIAGNOSIS — M5412 Radiculopathy, cervical region: Secondary | ICD-10-CM | POA: Diagnosis not present

## 2017-01-19 DIAGNOSIS — G894 Chronic pain syndrome: Secondary | ICD-10-CM

## 2017-01-19 DIAGNOSIS — M7918 Myalgia, other site: Secondary | ICD-10-CM

## 2017-01-19 DIAGNOSIS — M791 Myalgia: Secondary | ICD-10-CM

## 2017-01-19 DIAGNOSIS — M19011 Primary osteoarthritis, right shoulder: Secondary | ICD-10-CM | POA: Insufficient documentation

## 2017-01-19 MED ORDER — HYDROCODONE-ACETAMINOPHEN 7.5-325 MG PO TABS: 1.0000 | ORAL_TABLET | Freq: Four times a day (QID) | ORAL | 0 refills | Status: DC | PRN

## 2017-01-19 NOTE — Patient Instructions (Signed)
Try taking Gabapentin at 9am, 2pm, and  8pm.  Call office on Monday to evaluate medication regimen.

## 2017-01-19 NOTE — Progress Notes (Signed)
Subjective:    Patient ID: UCHECHI DENISON, female    DOB: 21-Mar-1946, 71 y.o.   MRN: [id number]  HPI: Ms. FRAIDA VELDMAN is a 71year old female who returns for follow up appointmentfor chronic pain and medication refill.She states her pain is located in her neck radiating into her right shoulder and mid-lower back pain.She rates her pain 5.Her current exercise regime is performing Yoga twice a week. walking and performing stretching exercises.  Ms. Bobeck last UDS was on 11/11/2015 it was consistent.   Pain Inventory Average Pain 6 Pain Right Now 5 My pain is intermittent, burning and aching  In the last 24 hours, has pain interfered with the following? General activity 4 Relation with others 6 Enjoyment of life 8 What TIME of day is your pain at its worst? daytime Sleep (in general) Fair  Pain is worse with: sitting and some activites Pain improves with: rest, heat/ice, medication and injections Relief from Meds: 7  Mobility Do you have any goals in this area?  yes  Function Do you have any goals in this area?  yes  Neuro/Psych bladder control problems weakness numbness tremor depression anxiety  Prior Studies Any changes since last visit?  no  Physicians involved in your care Any changes since last visit?  no   Family History  Problem Relation Age of Onset  . Cancer Unknown        breast -- grandmother  . Cancer Unknown        lung -- uncles (3)  . Leukemia Sister   . Diabetes Father   . Coronary artery disease Father   . Diabetes Mother    Social History   Social History  . Marital status: Married    Spouse name: N/A  . Number of children: N/A  . Years of education: N/A   Social History Main Topics  . Smoking status: Never Smoker  . Smokeless tobacco: Never Used  . Alcohol use No  . Drug use: No  . Sexual activity: Not Asked   Other Topics Concern  . None   Social History Narrative  . None   Past Surgical History:  Procedure  Laterality Date  . CARDIAC CATHETERIZATION  01-17-2003  dr Darnell Level brodie   normal coronary arteries and LVF,  ef 60%  . EXCISION MORTON'S NEUROMA  2002   bilateral feet  . EXCISION OF BREAST BIOPSY Right 1990's   benign  . INTERSTIM IMPLANT PLACEMENT N/A 02/10/2015   Procedure: Barrie Lyme IMPLANT FIRST STAGE;  Surgeon: Bjorn Loser, MD;  Location: [facility];  Service: Urology;  Laterality: N/A;  . INTERSTIM IMPLANT PLACEMENT N/A 02/10/2015   Procedure: Barrie Lyme IMPLANT SECOND STAGE;  Surgeon: Bjorn Loser, MD;  Location: [facility];  Service: Urology;  Laterality: N/A;  . LAPAROSCOPIC CHOLECYSTECTOMY  2000  . NEGATIVE SLEEP STUDY  2000   per pt  . ORIF ANKLE FRACTURE  01/10/2012   Procedure: OPEN REDUCTION INTERNAL FIXATION (ORIF) ANKLE FRACTURE;  Surgeon: Sanjuana Kava, MD;  Location: [facility];  Service: Orthopedics;  Laterality: Left;  . TOTAL THYROIDECTOMY  1985   benign tumor  . TRANSTHORACIC ECHOCARDIOGRAM  03-02-2009   normal echo,  ef 55-60%  . VAGINAL HYSTERECTOMY  1984  . WRIST SURGERY Left 2000   Past Medical History:  Diagnosis Date  . Anxiety   . Cervical radiculopathy   . Chronic back pain    R > L  . Chronic neck pain   .  Depression   . Dysphagia    functional --  takes small bites  . Fibromyalgia   . GERD (gastroesophageal reflux disease)   . Hemorrhoids   . History of ankle fracture 01/08/2012   Status post open reduction internal fixation of a left ankle trimalleolar fracture by Dr. Iona Hansen on 01/10/2012.    Marland Kitchen History of benign thyroid tumor   . History of diabetes mellitus, type II    was diet controlled -- per pt pcp she was no longer diabetic  . History of hiatal hernia   . History of TIA (transient ischemic attack)    02-27-2009  no residual  . HTN (hypertension)   . Hyperlipidemia   . Hypothyroidism, postsurgical   . Lumbar radicular pain    R>L legs  . Migraine headache   . Mild intermittent asthma with  allergic rhinitis without complication   . PONV (postoperative nausea and vomiting)   . Rotator cuff syndrome of right shoulder   . Urge urinary incontinence    refractory  . Wears glasses    BP (!) 144/87   Pulse 93   SpO2 94%   Opioid Risk Score:  7 Fall Risk Score:  `1  Depression screen PHQ 2/9  Depression screen [facility] 2/9 01/19/2017 11/10/2016 10/17/2016 09/13/2016 12/22/2015 10/19/2015 08/20/2015  Decreased Interest 3 3 3 3  0 0 0  Down, Depressed, Hopeless 3 3 3 3  0 1 1  PHQ - 2 Score 6 6 6 6  0 1 1  Altered sleeping - - - - - 2 -  Tired, decreased energy - - - - - 2 -  Change in appetite - - - - - 3 -  Feeling bad or failure about yourself  - - - - - 0 -  Trouble concentrating - - - - - 0 -  Moving slowly or fidgety/restless - - - - - 3 -  Suicidal thoughts - - - - - 0 -  PHQ-9 Score - - - - - 11 -  Some recent data might be hidden   Review of Systems  Constitutional: Positive for appetite change and unexpected weight change.  Eyes: Negative.   Respiratory: Positive for cough and shortness of breath.   Cardiovascular: Positive for leg swelling.  Gastrointestinal: Positive for abdominal pain, constipation and nausea.  Endocrine: Negative.   Genitourinary: Positive for urgency.  Skin: Positive for rash.  Allergic/Immunologic: Negative.   Neurological: Positive for tremors, weakness and numbness.  Hematological: Negative.   Psychiatric/Behavioral: Positive for dysphoric mood. The patient is nervous/anxious.   All other systems reviewed and are negative.      Objective:   Physical Exam  Constitutional: She is oriented to person, place, and time. She appears well-developed and well-nourished.  HENT:  Head: Normocephalic and atraumatic.  Neck: Normal range of motion. Neck supple.  Cervical Paraspinal Tenderness: C-5-C-6  Cardiovascular: Normal rate and regular rhythm.   Pulmonary/Chest: Effort normal and breath sounds normal.  Musculoskeletal:  Normal Muscle Bulk and  Muscle Testing Reveals: Upper Extremities: Full ROM and Muscle Strength 5/5 Right AC Joint Tenderness Thoracic Paraspinal Tenderness: T-1-T-3 Lower Extremities: Full ROM and Muscle Strength 5/5 Arises from Table with ease using straight cane for support Narrow Based Gait  Neurological: She is alert and oriented to person, place, and time.  Skin: Skin is warm and dry.  Psychiatric: She has a normal mood and affect.  Nursing note and vitals reviewed.         Assessment &  Plan:  1.History of fibromyalgia. Continue Current Medication Regime and Exercise Regime. Continue Gabapentin. 01/19/2017 2. Chronic low back pain: Continue HEP as Tolerated. Continue to Monitor Refilled: Hydrocodone 7.5/325mg  one tablet every 6hours as needed for pain #70.  We will continue the opioid monitoring program, this consists of regular clinic visits, examinations, urine drug screen, pill counts as well as use of [place] Controlled Substance Reporting System. Continue Neurontin and Zanaflex  3. Cervical Radiculopathy: ContinueGabapentin. 01/19/2017 4. Anxiety and depression. Continue Effexor and Klonopin. 01/19/2017  20 minutes of face to face patient care time was spent during this visit. All questions were encouraged and answered.  F/U in 93month

## 2017-01-23 ENCOUNTER — Telehealth: Payer: Self-pay | Admitting: Registered Nurse

## 2017-01-23 NOTE — Telephone Encounter (Signed)
On 01/23/2017 the  [place] was reviewed no conflict was seen on the [place] with multiple prescribers. [name] [name] has a signed narcotic contract with our office. If there were any discrepancies this would have been reported to her physician.

## 2017-02-09 ENCOUNTER — Other Ambulatory Visit: Payer: Self-pay | Admitting: Physical Medicine & Rehabilitation

## 2017-02-09 DIAGNOSIS — F418 Other specified anxiety disorders: Secondary | ICD-10-CM

## 2017-02-09 DIAGNOSIS — M797 Fibromyalgia: Secondary | ICD-10-CM

## 2017-02-15 ENCOUNTER — Encounter: Payer: Self-pay | Admitting: Registered Nurse

## 2017-02-15 ENCOUNTER — Encounter: Payer: Medicare HMO | Attending: Physical Medicine and Rehabilitation | Admitting: Registered Nurse

## 2017-02-15 VITALS — BP 134/79 | HR 90

## 2017-02-15 DIAGNOSIS — M4696 Unspecified inflammatory spondylopathy, lumbar region: Secondary | ICD-10-CM

## 2017-02-15 DIAGNOSIS — G8929 Other chronic pain: Secondary | ICD-10-CM

## 2017-02-15 DIAGNOSIS — F418 Other specified anxiety disorders: Secondary | ICD-10-CM

## 2017-02-15 DIAGNOSIS — M19011 Primary osteoarthritis, right shoulder: Secondary | ICD-10-CM | POA: Diagnosis not present

## 2017-02-15 DIAGNOSIS — Z79899 Other long term (current) drug therapy: Secondary | ICD-10-CM | POA: Diagnosis not present

## 2017-02-15 DIAGNOSIS — M7918 Myalgia, other site: Secondary | ICD-10-CM

## 2017-02-15 DIAGNOSIS — M546 Pain in thoracic spine: Secondary | ICD-10-CM

## 2017-02-15 DIAGNOSIS — M5412 Radiculopathy, cervical region: Secondary | ICD-10-CM | POA: Diagnosis not present

## 2017-02-15 DIAGNOSIS — M542 Cervicalgia: Secondary | ICD-10-CM

## 2017-02-15 DIAGNOSIS — M797 Fibromyalgia: Secondary | ICD-10-CM

## 2017-02-15 DIAGNOSIS — M47816 Spondylosis without myelopathy or radiculopathy, lumbar region: Secondary | ICD-10-CM

## 2017-02-15 DIAGNOSIS — Z5181 Encounter for therapeutic drug level monitoring: Secondary | ICD-10-CM | POA: Diagnosis not present

## 2017-02-15 DIAGNOSIS — R69 Illness, unspecified: Secondary | ICD-10-CM | POA: Diagnosis not present

## 2017-02-15 DIAGNOSIS — M19041 Primary osteoarthritis, right hand: Secondary | ICD-10-CM

## 2017-02-15 DIAGNOSIS — M791 Myalgia: Secondary | ICD-10-CM

## 2017-02-15 DIAGNOSIS — G894 Chronic pain syndrome: Secondary | ICD-10-CM | POA: Diagnosis not present

## 2017-02-15 MED ORDER — HYDROCODONE-ACETAMINOPHEN 7.5-325 MG PO TABS: 1.0000 | ORAL_TABLET | Freq: Three times a day (TID) | ORAL | 0 refills | Status: DC | PRN

## 2017-02-15 MED ORDER — HYDROCODONE-ACETAMINOPHEN 7.5-325 MG PO TABS: 1.0000 | ORAL_TABLET | Freq: Four times a day (QID) | ORAL | 0 refills | Status: DC | PRN

## 2017-02-15 NOTE — Patient Instructions (Signed)
Increase Gabapentin to 4 times a day: Nerve Pain  Breakfast/ Lunch/ Supper and Bedtime:  Call office in a week to evaluation medication changes.

## 2017-02-15 NOTE — Progress Notes (Signed)
Subjective:    Patient ID: [name] [name], female    DOB: 1946-05-30, 71 y.o.   MRN: [id number]  HPI: [name] is a 71year old female who returns for follow up appointmentfor chronic pain and medication refill.She states her pain is located in her neck radiating into her right shoulder, right arm and right hand with tingling and numbness, states her tingling and numbness has increased intensity, will increase Gabapentin instructions given.. Also reports mid back pain. She rates her pain 6.Her current exercise regime is walking and performing stretching exercises. [name] [name] husband has become ill which has increased her intensity of pain  due to increase activity while performing. Encourage to work on other strategies for pain control including optimization of her mood, exercise, appropriate sleep,  and leisure activities, she verbalizes understanding.   [name] [name] reports her Bladder implants has been running hot, she has removed the batteries, instructed to call her urologist, she verbalizes understanding.   [name] [name] last UDS was performed  on 11/10/2016 it was consistent, Oral Swab performed today.   Pain Inventory Average Pain 8 Pain Right Now 6 My pain is constant, sharp, burning and aching  In the last 24 hours, has pain interfered with the following? General activity 9 Relation with others 7 Enjoyment of life 7 What TIME of day is your pain at its worst? daytime Sleep (in general) Fair  Pain is worse with: bending, standing and some activites Pain improves with: rest, heat/ice, medication and injections Relief from Meds: 7  Mobility use a cane ability to climb steps?  yes do you drive?  yes  Function I need assistance with the following:  meal prep, household duties and shopping  Neuro/Psych bladder control problems bowel control problems weakness numbness dizziness depression anxiety  Prior Studies Any changes since last visit?  no  Physicians  involved in your care Any changes since last visit?  no   Family History  Problem Relation Age of Onset  . Cancer Unknown        breast -- grandmother  . Cancer Unknown        lung -- uncles (3)  . Leukemia Sister   . Diabetes Father   . Coronary artery disease Father   . Diabetes Mother    Social History   Social History  . Marital status: Married    Spouse name: N/A  . Number of children: N/A  . Years of education: N/A   Social History Main Topics  . Smoking status: Never Smoker  . Smokeless tobacco: Never Used  . Alcohol use No  . Drug use: No  . Sexual activity: Not on file   Other Topics Concern  . Not on file   Social History Narrative  . No narrative on file   Past Surgical History:  Procedure Laterality Date  . CARDIAC CATHETERIZATION  01-17-2003  dr Darnell Level brodie   normal coronary arteries and LVF,  ef 60%  . EXCISION MORTON'S NEUROMA  2002   bilateral feet  . EXCISION OF BREAST BIOPSY Right 1990's   benign  . INTERSTIM IMPLANT PLACEMENT N/A 02/10/2015   Procedure: Barrie Lyme IMPLANT FIRST STAGE;  Surgeon: Bjorn Loser, MD;  Location: [facility];  Service: Urology;  Laterality: N/A;  . INTERSTIM IMPLANT PLACEMENT N/A 02/10/2015   Procedure: Barrie Lyme IMPLANT SECOND STAGE;  Surgeon: Bjorn Loser, MD;  Location: [facility];  Service: Urology;  Laterality: N/A;  . LAPAROSCOPIC CHOLECYSTECTOMY  2000  .  NEGATIVE SLEEP STUDY  2000   per pt  . ORIF ANKLE FRACTURE  01/10/2012   Procedure: OPEN REDUCTION INTERNAL FIXATION (ORIF) ANKLE FRACTURE;  Surgeon: Sanjuana Kava, MD;  Location: [facility];  Service: Orthopedics;  Laterality: Left;  . TOTAL THYROIDECTOMY  1985   benign tumor  . TRANSTHORACIC ECHOCARDIOGRAM  03-02-2009   normal echo,  ef 55-60%  . VAGINAL HYSTERECTOMY  1984  . WRIST SURGERY Left 2000   Past Medical History:  Diagnosis Date  . Anxiety   . Cervical radiculopathy   . Chronic back pain    R > L  . Chronic  neck pain   . Depression   . Dysphagia    functional --  takes small bites  . Fibromyalgia   . GERD (gastroesophageal reflux disease)   . Hemorrhoids   . History of ankle fracture 01/08/2012   Status post open reduction internal fixation of a left ankle trimalleolar fracture by Dr. Iona Hansen on 01/10/2012.    Marland Kitchen History of benign thyroid tumor   . History of diabetes mellitus, type II    was diet controlled -- per pt pcp she was no longer diabetic  . History of hiatal hernia   . History of TIA (transient ischemic attack)    02-27-2009  no residual  . HTN (hypertension)   . Hyperlipidemia   . Hypothyroidism, postsurgical   . Lumbar radicular pain    R>L legs  . Migraine headache   . Mild intermittent asthma with allergic rhinitis without complication   . PONV (postoperative nausea and vomiting)   . Rotator cuff syndrome of right shoulder   . Urge urinary incontinence    refractory  . Wears glasses    There were no vitals taken for this visit.  Opioid Risk Score:   Fall Risk Score:  `1  Depression screen PHQ 2/9  Depression screen [facility] 2/9 01/19/2017 11/10/2016 10/17/2016 09/13/2016 12/22/2015 10/19/2015 08/20/2015  Decreased Interest 3 3 3 3  0 0 0  Down, Depressed, Hopeless 3 3 3 3  0 1 1  PHQ - 2 Score 6 6 6 6  0 1 1  Altered sleeping - - - - - 2 -  Tired, decreased energy - - - - - 2 -  Change in appetite - - - - - 3 -  Feeling bad or failure about yourself  - - - - - 0 -  Trouble concentrating - - - - - 0 -  Moving slowly or fidgety/restless - - - - - 3 -  Suicidal thoughts - - - - - 0 -  PHQ-9 Score - - - - - 11 -  Some recent data might be hidden     Review of Systems  Constitutional: Negative.   HENT: Negative.   Eyes: Negative.   Respiratory: Negative.   Cardiovascular: Negative.   Gastrointestinal: Negative.   Endocrine: Negative.   Genitourinary: Negative.   Musculoskeletal: Negative.   Skin: Negative.   Allergic/Immunologic: Negative.   Neurological:  Negative.   Hematological: Negative.   Psychiatric/Behavioral: Negative.   All other systems reviewed and are negative.      Objective:   Physical Exam  Constitutional: She is oriented to person, place, and time. She appears well-developed and well-nourished.  HENT:  Head: Normocephalic and atraumatic.  Neck: Normal range of motion. Neck supple.  Cardiovascular: Normal rate and regular rhythm.   Pulmonary/Chest: Effort normal and breath sounds normal.  Musculoskeletal:  Normal Muscle Bulk and Muscle Testing  Reveals: Upper Extremities: Full ROM and Muscle Strength 5/5 Right AC Joint Tenderness Thoracic Paraspinal Tenderness: T-7-T-9 Lower Extremities: Full ROM and Muscle Strength 5/5 Arises from Table Slowly using straight cane for support Narrow Based Gait  Neurological: She is alert and oriented to person, place, and time.  Skin: Skin is warm and dry.  Psychiatric: She has a normal mood and affect.  Nursing note and vitals reviewed.         Assessment & Plan:  1.History of fibromyalgia. Continue Current Medication Regime and Exercise Regime. Continue Gabapentin. 02/15/2017 2. Chronic low back pain: Continue HEP as Tolerated. Continue to Monitor Refilled:Increased: Hydrocodone 7.5/325mg  one tablet every 8hours as needed for pain # 80 this month. Will re-evaluate next month..  We will continue the opioid monitoring program, this consists of regular clinic visits, examinations, urine drug screen, pill counts as well as use of [place] Controlled Substance Reporting System. Continue Neurontin. 3. Cervical Radiculopathy: IncreaseGabapentin. 02/15/2017 4. Anxiety and depression. Continue Effexor and Klonopin. 02/15/2017 5. OA: Continue Voltaren Gel  20 minutes of face to face patient care time was spent during this visit. All questions were encouraged and answered.  F/U in 28month

## 2017-02-23 ENCOUNTER — Telehealth: Payer: Self-pay | Admitting: *Deleted

## 2017-02-23 LAB — DRUG TOX MONITOR 1 W/CONF, ORAL FLD
AMPHETAMINES: NEGATIVE ng/mL (ref <10)
BENZODIAZEPINES: NEGATIVE ng/mL (ref <0.50)
Barbiturates: NEGATIVE ng/mL (ref <10)
Buprenorphine: NEGATIVE ng/mL (ref <0.025)
COCAINE: NEGATIVE ng/mL (ref <2.5)
Codeine: NEGATIVE ng/mL (ref <2.5)
Dihydrocodeine: NEGATIVE ng/mL (ref <2.5)
Fentanyl: NEGATIVE ng/mL (ref <0.10)
Heroin Metabolite: NEGATIVE ng/mL (ref <1.0)
Hydrocodone: 20.7 ng/mL — ABNORMAL HIGH (ref <2.5)
Hydromorphone: NEGATIVE ng/mL (ref <2.5)
MDMA: NEGATIVE ng/mL (ref <10)
MEPERIDINE: NEGATIVE ng/mL (ref <5.0)
METHADONE: NEGATIVE ng/mL (ref <5.0)
MORPHINE: NEGATIVE ng/mL (ref <2.5)
Marijuana: NEGATIVE ng/mL (ref <2.5)
Meprobamate: NEGATIVE ng/mL (ref <2.5)
NICOTINE METABOLITE: NEGATIVE ng/mL (ref <5.0)
Norhydrocodone: 2.6 ng/mL — ABNORMAL HIGH (ref <2.5)
Noroxycodone: NEGATIVE ng/mL (ref <2.5)
OPIATES: POSITIVE ng/mL — AB (ref <2.5)
Oxycodone: NEGATIVE ng/mL (ref <2.5)
Oxymorphone: NEGATIVE ng/mL (ref <2.5)
Phencyclidine: NEGATIVE ng/mL (ref <10)
Propoxyphene: NEGATIVE ng/mL (ref <5.0)
TAPENTADOL: NEGATIVE ng/mL (ref <5.0)
Tramadol: NEGATIVE ng/mL (ref <5.0)
Zolpidem: NEGATIVE ng/mL (ref <5.0)

## 2017-02-23 LAB — DRUG TOX ALC METAB W/CON, ORAL FLD: Alcohol Metabolite: NEGATIVE ng/mL (ref <25)

## 2017-02-23 NOTE — Telephone Encounter (Signed)
Oral swab drug screen was consistent for prescribed medications.  ?

## 2017-03-02 ENCOUNTER — Other Ambulatory Visit: Payer: Self-pay | Admitting: Physical Medicine & Rehabilitation

## 2017-03-02 DIAGNOSIS — M75100 Unspecified rotator cuff tear or rupture of unspecified shoulder, not specified as traumatic: Secondary | ICD-10-CM

## 2017-03-02 DIAGNOSIS — M546 Pain in thoracic spine: Secondary | ICD-10-CM

## 2017-03-02 DIAGNOSIS — M47816 Spondylosis without myelopathy or radiculopathy, lumbar region: Secondary | ICD-10-CM

## 2017-03-02 DIAGNOSIS — M797 Fibromyalgia: Secondary | ICD-10-CM

## 2017-03-16 ENCOUNTER — Encounter: Payer: Medicare HMO | Admitting: Registered Nurse

## 2017-03-21 ENCOUNTER — Encounter: Payer: Medicare HMO | Attending: Physical Medicine and Rehabilitation | Admitting: Registered Nurse

## 2017-03-21 ENCOUNTER — Encounter: Payer: Self-pay | Admitting: Registered Nurse

## 2017-03-21 VITALS — BP 150/87 | HR 84

## 2017-03-21 DIAGNOSIS — M19011 Primary osteoarthritis, right shoulder: Secondary | ICD-10-CM | POA: Diagnosis not present

## 2017-03-21 DIAGNOSIS — M7711 Lateral epicondylitis, right elbow: Secondary | ICD-10-CM

## 2017-03-21 DIAGNOSIS — M5416 Radiculopathy, lumbar region: Secondary | ICD-10-CM

## 2017-03-21 DIAGNOSIS — G894 Chronic pain syndrome: Secondary | ICD-10-CM

## 2017-03-21 DIAGNOSIS — M546 Pain in thoracic spine: Secondary | ICD-10-CM | POA: Diagnosis not present

## 2017-03-21 DIAGNOSIS — M47816 Spondylosis without myelopathy or radiculopathy, lumbar region: Secondary | ICD-10-CM

## 2017-03-21 DIAGNOSIS — M75100 Unspecified rotator cuff tear or rupture of unspecified shoulder, not specified as traumatic: Secondary | ICD-10-CM

## 2017-03-21 DIAGNOSIS — Z79899 Other long term (current) drug therapy: Secondary | ICD-10-CM

## 2017-03-21 DIAGNOSIS — M47817 Spondylosis without myelopathy or radiculopathy, lumbosacral region: Secondary | ICD-10-CM | POA: Diagnosis not present

## 2017-03-21 DIAGNOSIS — M7918 Myalgia, other site: Secondary | ICD-10-CM

## 2017-03-21 DIAGNOSIS — M797 Fibromyalgia: Secondary | ICD-10-CM | POA: Diagnosis not present

## 2017-03-21 DIAGNOSIS — Z5181 Encounter for therapeutic drug level monitoring: Secondary | ICD-10-CM

## 2017-03-21 DIAGNOSIS — M4696 Unspecified inflammatory spondylopathy, lumbar region: Secondary | ICD-10-CM | POA: Diagnosis not present

## 2017-03-21 DIAGNOSIS — M791 Myalgia: Secondary | ICD-10-CM | POA: Diagnosis not present

## 2017-03-21 MED ORDER — GABAPENTIN 300 MG PO CAPS: 300.0000 mg | ORAL_CAPSULE | Freq: Three times a day (TID) | ORAL | 3 refills | Status: DC

## 2017-03-21 MED ORDER — HYDROCODONE-ACETAMINOPHEN 7.5-325 MG PO TABS: 1.0000 | ORAL_TABLET | Freq: Three times a day (TID) | ORAL | 0 refills | Status: DC | PRN

## 2017-03-21 MED ORDER — METHYLPREDNISOLONE 4 MG PO TBPK
ORAL_TABLET | ORAL | 0 refills | Status: DC
Start: 2017-03-21 — End: 2017-04-14

## 2017-03-21 MED ORDER — HYDROCODONE-ACETAMINOPHEN 7.5-325 MG PO TABS: 1.0000 | ORAL_TABLET | Freq: Two times a day (BID) | ORAL | 0 refills | Status: DC | PRN

## 2017-03-21 NOTE — Progress Notes (Signed)
Subjective:    Patient ID: [name] [name], female    DOB: 04-06-1946, 71 y.o.   MRN: [id number]  HPI: [name] is a 71year old female who returns for follow up appointmentfor chronic pain and medication refill.She states her pain is located in her neck radiating into her right elbow and lower back pain radiating into her left lower extremity.  She rates her pain 8.Her current exercise regime is walking and performing stretching exercises. [name] asked if her hydrocodone tablets could be decreased to 60 tablets, this will be done. She verbalizes understanding.   [name] stating again her Bladder implants is not functioning correctly  instructed again to follow up with her urologist, she verbalizes understanding.   [name] Oral Swab was performed  on 02/15/2017 it was consistent.  Pain Inventory Average Pain 7 Pain Right Now 8 My pain is constant, burning, dull and aching  In the last 24 hours, has pain interfered with the following? General activity 9 Relation with others 7 Enjoyment of life 7 What TIME of day is your pain at its worst? daytime Sleep (in general) Fair  Pain is worse with: bending, standing and some activites Pain improves with: rest, heat/ice, medication and injections Relief from Meds: 7  Mobility use a cane ability to climb steps?  yes do you drive?  yes  Function I need assistance with the following:  meal prep, household duties and shopping  Neuro/Psych bladder control problems bowel control problems weakness numbness tremor tingling trouble walking dizziness depression anxiety  Prior Studies Any changes since last visit?  no  Physicians involved in your care Any changes since last visit?  no   Family History  Problem Relation Age of Onset  . Cancer Unknown        breast -- grandmother  . Cancer Unknown        lung -- uncles (3)  . Leukemia Sister   . Diabetes Father   . Coronary artery disease Father   .  Diabetes Mother    Social History   Social History  . Marital status: Married    Spouse name: N/A  . Number of children: N/A  . Years of education: N/A   Social History Main Topics  . Smoking status: Never Smoker  . Smokeless tobacco: Never Used  . Alcohol use No  . Drug use: No  . Sexual activity: Not Asked   Other Topics Concern  . None   Social History Narrative  . None   Past Surgical History:  Procedure Laterality Date  . CARDIAC CATHETERIZATION  01-17-2003  dr Darnell Level brodie   normal coronary arteries and LVF,  ef 60%  . EXCISION MORTON'S NEUROMA  2002   bilateral feet  . EXCISION OF BREAST BIOPSY Right 1990's   benign  . INTERSTIM IMPLANT PLACEMENT N/A 02/10/2015   Procedure: Barrie Lyme IMPLANT FIRST STAGE;  Surgeon: Bjorn Loser, MD;  Location: [facility];  Service: Urology;  Laterality: N/A;  . INTERSTIM IMPLANT PLACEMENT N/A 02/10/2015   Procedure: Barrie Lyme IMPLANT SECOND STAGE;  Surgeon: Bjorn Loser, MD;  Location: [facility];  Service: Urology;  Laterality: N/A;  . LAPAROSCOPIC CHOLECYSTECTOMY  2000  . NEGATIVE SLEEP STUDY  2000   per pt  . ORIF ANKLE FRACTURE  01/10/2012   Procedure: OPEN REDUCTION INTERNAL FIXATION (ORIF) ANKLE FRACTURE;  Surgeon: Sanjuana Kava, MD;  Location: [facility];  Service: Orthopedics;  Laterality: Left;  . TOTAL THYROIDECTOMY  1985   benign tumor  . TRANSTHORACIC ECHOCARDIOGRAM  03-02-2009   normal echo,  ef 55-60%  . VAGINAL HYSTERECTOMY  1984  . WRIST SURGERY Left 2000   Past Medical History:  Diagnosis Date  . Anxiety   . Cervical radiculopathy   . Chronic back pain    R > L  . Chronic neck pain   . Depression   . Dysphagia    functional --  takes small bites  . Fibromyalgia   . GERD (gastroesophageal reflux disease)   . Hemorrhoids   . History of ankle fracture 01/08/2012   Status post open reduction internal fixation of a left ankle trimalleolar fracture by Dr. Iona Hansen on  01/10/2012.    Marland Kitchen History of benign thyroid tumor   . History of diabetes mellitus, type II    was diet controlled -- per pt pcp she was no longer diabetic  . History of hiatal hernia   . History of TIA (transient ischemic attack)    02-27-2009  no residual  . HTN (hypertension)   . Hyperlipidemia   . Hypothyroidism, postsurgical   . Lumbar radicular pain    R>L legs  . Migraine headache   . Mild intermittent asthma with allergic rhinitis without complication   . PONV (postoperative nausea and vomiting)   . Rotator cuff syndrome of right shoulder   . Urge urinary incontinence    refractory  . Wears glasses    BP (!) 150/87   Pulse 84   SpO2 97%   Opioid Risk Score:  7 Fall Risk Score:  `1  Depression screen PHQ 2/9  Depression screen [facility] 2/9 03/21/2017 01/19/2017 11/10/2016 10/17/2016 09/13/2016 12/22/2015 10/19/2015  Decreased Interest 3 3 3 3 3  0 0  Down, Depressed, Hopeless 3 3 3 3 3  0 1  PHQ - 2 Score 6 6 6 6 6  0 1  Altered sleeping - - - - - - 2  Tired, decreased energy - - - - - - 2  Change in appetite - - - - - - 3  Feeling bad or failure about yourself  - - - - - - 0  Trouble concentrating - - - - - - 0  Moving slowly or fidgety/restless - - - - - - 3  Suicidal thoughts - - - - - - 0  PHQ-9 Score - - - - - - 11  Some recent data might be hidden     Review of Systems  HENT: Negative.   Eyes: Negative.   Respiratory: Negative.   Cardiovascular: Negative.   Gastrointestinal: Negative.   Endocrine: Negative.   Genitourinary:       Bladder control  Musculoskeletal: Positive for gait problem.  Skin: Negative.   Allergic/Immunologic: Negative.   Neurological: Positive for dizziness, tremors, weakness and numbness.  Hematological: Negative.   Psychiatric/Behavioral: Positive for dysphoric mood. The patient is nervous/anxious.   All other systems reviewed and are negative.      Objective:   Physical Exam  Constitutional: She appears well-developed and  well-nourished.  HENT:  Head: Normocephalic and atraumatic.  Neck: Normal range of motion. Neck supple.  Cardiovascular: Normal rate and regular rhythm.   Pulmonary/Chest: Effort normal and breath sounds normal.  Musculoskeletal:  Normal Muscle Bulk and Muscle Testing Reveals: Upper Extremities: Full ROM and Muscle Strength 5/5 Right Lateral epicondyle tenderness Lumbar Hypersensitivity Lower Extremities: Full ROM and Muscle Strength 5/5 Arises from Table Slowly using straight cane for support Narrow Based  Gait  Skin: Skin is warm and dry.  Psychiatric: She has a normal mood and affect.  Nursing note and vitals reviewed.         Assessment & Plan:  1.History of fibromyalgia. Continue Current Medication Regime and Exercise Regime. Continue Gabapentin. 03/21/2017 2. Chronic low back pain: Continue HEP as Tolerated. Continue to Monitor Refilled: Decreased: Hydrocodone 7.5/325mg  one tablet twice a day as needed for pain # 60 this month.  We will continue the opioid monitoring program, this consists of regular clinic visits, examinations, urine drug screen, pill counts as well as use of [place] Controlled Substance Reporting System. Continue Neurontin. 3. Cervical Radiculopathy: Continue Gabapentin:  03/21/2017 4. Anxiety and depression. Continue Effexor and Klonopin. 03/21/2017 5. OA: Continue Voltaren Gel. 03/21/2017 6. Right Lateral Epicondylitis: RX: Medrol Dose Pak. 03/21/2017  20 minutes of face to face patient care time was spent during this visit. All questions were encouraged and answered.  F/U in 79month

## 2017-03-24 DIAGNOSIS — K5900 Constipation, unspecified: Secondary | ICD-10-CM | POA: Diagnosis not present

## 2017-03-24 DIAGNOSIS — I10 Essential (primary) hypertension: Secondary | ICD-10-CM | POA: Diagnosis not present

## 2017-03-24 DIAGNOSIS — R350 Frequency of micturition: Secondary | ICD-10-CM | POA: Diagnosis not present

## 2017-03-24 DIAGNOSIS — N3281 Overactive bladder: Secondary | ICD-10-CM | POA: Diagnosis not present

## 2017-03-24 DIAGNOSIS — R69 Illness, unspecified: Secondary | ICD-10-CM | POA: Diagnosis not present

## 2017-03-24 DIAGNOSIS — R5383 Other fatigue: Secondary | ICD-10-CM | POA: Diagnosis not present

## 2017-04-06 DIAGNOSIS — Z23 Encounter for immunization: Secondary | ICD-10-CM | POA: Diagnosis not present

## 2017-04-06 DIAGNOSIS — I10 Essential (primary) hypertension: Secondary | ICD-10-CM | POA: Diagnosis not present

## 2017-04-14 ENCOUNTER — Encounter: Payer: Medicare HMO | Attending: Physical Medicine and Rehabilitation | Admitting: Registered Nurse

## 2017-04-14 ENCOUNTER — Encounter: Payer: Self-pay | Admitting: Registered Nurse

## 2017-04-14 ENCOUNTER — Telehealth: Payer: Self-pay | Admitting: Registered Nurse

## 2017-04-14 VITALS — BP 128/79 | HR 68

## 2017-04-14 DIAGNOSIS — M797 Fibromyalgia: Secondary | ICD-10-CM | POA: Diagnosis not present

## 2017-04-14 DIAGNOSIS — Z5181 Encounter for therapeutic drug level monitoring: Secondary | ICD-10-CM | POA: Diagnosis not present

## 2017-04-14 DIAGNOSIS — Z79899 Other long term (current) drug therapy: Secondary | ICD-10-CM | POA: Insufficient documentation

## 2017-04-14 DIAGNOSIS — M47816 Spondylosis without myelopathy or radiculopathy, lumbar region: Secondary | ICD-10-CM

## 2017-04-14 DIAGNOSIS — G894 Chronic pain syndrome: Secondary | ICD-10-CM

## 2017-04-14 DIAGNOSIS — T402X5A Adverse effect of other opioids, initial encounter: Secondary | ICD-10-CM

## 2017-04-14 DIAGNOSIS — M19011 Primary osteoarthritis, right shoulder: Secondary | ICD-10-CM | POA: Insufficient documentation

## 2017-04-14 DIAGNOSIS — F418 Other specified anxiety disorders: Secondary | ICD-10-CM | POA: Diagnosis not present

## 2017-04-14 DIAGNOSIS — R69 Illness, unspecified: Secondary | ICD-10-CM | POA: Diagnosis not present

## 2017-04-14 DIAGNOSIS — M47817 Spondylosis without myelopathy or radiculopathy, lumbosacral region: Secondary | ICD-10-CM

## 2017-04-14 DIAGNOSIS — K5903 Drug induced constipation: Secondary | ICD-10-CM

## 2017-04-14 MED ORDER — NALOXEGOL OXALATE 12.5 MG PO TABS: 25.0000 mg | ORAL_TABLET | Freq: Every day | ORAL | Status: DC

## 2017-04-14 MED ORDER — HYDROCODONE-ACETAMINOPHEN 7.5-325 MG PO TABS: 1.0000 | ORAL_TABLET | Freq: Two times a day (BID) | ORAL | 0 refills | Status: DC | PRN

## 2017-04-14 NOTE — Telephone Encounter (Signed)
On 04/14/2017 the [place] was reviewed no conflict was seen on the [place] with multiple prescribers. [name] [name] has a signed narcotic contract with our office. If there were any discrepancies this would have been reported to her physician.

## 2017-04-14 NOTE — Progress Notes (Signed)
Subjective:    Patient ID: [name] [name], female    DOB: 07-08-1946, 71 y.o.   MRN: [id number]  HPI: [name] is a 71year old female who returns for follow up appointmentfor chronic pain and medication refill.She states her pain is located in her lower back pain and left ankle. She rates her pain 8.Her current exercise regime is walking and performing stretching exercises. Ms. Campoy reports constipation, states she hasn't had a bowel movement in days, in the past she has used colace, fleets enema and Miralax with no results.  Movantik samples given with education packet, she verbalizes understanding.. Instructed to call office on Monday 04/17/2017.  Ms. Beckstrand reports she has a Urology F/U appointment on Monday, regarding her Bladder implant not functioning correctly, encouraged to keep her appointment with Urology. She verbalizes understanding.   Ms. Brents Morphine equivalent is 15 MME.She is also prescribed Klonopin. We have discussed the black box warning of using opioids and benzodiazepines. I highlighted the dangers of using these drugs together and discussed the adverse events including respiratory suppression, overdose, cognitive impairment and importance of  compliance with current regimen. She verbalizes understanding, we will continue to monitor and adjust as indicated.      Ms. Calbert Oral Swab was performed  on 02/15/2017 it was consistent.  Pain Inventory Average Pain 8 Pain Right Now 8 My pain is aching  In the last 24 hours, has pain interfered with the following? General activity 5 Relation with others 6 Enjoyment of life 8 What TIME of day is your pain at its worst? morning and night Sleep (in general) Fair  Pain is worse with: walking, inactivity, standing and some activites Pain improves with: rest, medication and injections Relief from Meds: 8  Mobility use a cane ability to climb steps?  yes do you drive?  yes  Function I need assistance with the  following:  meal prep, household duties and shopping  Neuro/Psych bladder control problems bowel control problems weakness numbness tremor tingling trouble walking dizziness depression anxiety  Prior Studies Any changes since last visit?  no  Physicians involved in your care Any changes since last visit?  no   Family History  Problem Relation Age of Onset  . Cancer Unknown        breast -- grandmother  . Cancer Unknown        lung -- uncles (3)  . Leukemia Sister   . Diabetes Father   . Coronary artery disease Father   . Diabetes Mother    Social History   Social History  . Marital status: Married    Spouse name: N/A  . Number of children: N/A  . Years of education: N/A   Social History Main Topics  . Smoking status: Never Smoker  . Smokeless tobacco: Never Used  . Alcohol use No  . Drug use: No  . Sexual activity: Not on file   Other Topics Concern  . Not on file   Social History Narrative  . No narrative on file   Past Surgical History:  Procedure Laterality Date  . CARDIAC CATHETERIZATION  01-17-2003  dr Darnell Level brodie   normal coronary arteries and LVF,  ef 60%  . EXCISION MORTON'S NEUROMA  2002   bilateral feet  . EXCISION OF BREAST BIOPSY Right 1990's   benign  . INTERSTIM IMPLANT PLACEMENT N/A 02/10/2015   Procedure: Barrie Lyme IMPLANT FIRST STAGE;  Surgeon: Bjorn Loser, MD;  Location: [facility];  Service:  Urology;  Laterality: N/A;  . INTERSTIM IMPLANT PLACEMENT N/A 02/10/2015   Procedure: Barrie Lyme IMPLANT SECOND STAGE;  Surgeon: Bjorn Loser, MD;  Location: [facility];  Service: Urology;  Laterality: N/A;  . LAPAROSCOPIC CHOLECYSTECTOMY  2000  . NEGATIVE SLEEP STUDY  2000   per pt  . ORIF ANKLE FRACTURE  01/10/2012   Procedure: OPEN REDUCTION INTERNAL FIXATION (ORIF) ANKLE FRACTURE;  Surgeon: Sanjuana Kava, MD;  Location: [facility];  Service: Orthopedics;  Laterality: Left;  . TOTAL THYROIDECTOMY  1985    benign tumor  . TRANSTHORACIC ECHOCARDIOGRAM  03-02-2009   normal echo,  ef 55-60%  . VAGINAL HYSTERECTOMY  1984  . WRIST SURGERY Left 2000   Past Medical History:  Diagnosis Date  . Anxiety   . Cervical radiculopathy   . Chronic back pain    R > L  . Chronic neck pain   . Depression   . Dysphagia    functional --  takes small bites  . Fibromyalgia   . GERD (gastroesophageal reflux disease)   . Hemorrhoids   . History of ankle fracture 01/08/2012   Status post open reduction internal fixation of a left ankle trimalleolar fracture by Dr. Iona Hansen on 01/10/2012.    Marland Kitchen History of benign thyroid tumor   . History of diabetes mellitus, type II    was diet controlled -- per pt pcp she was no longer diabetic  . History of hiatal hernia   . History of TIA (transient ischemic attack)    02-27-2009  no residual  . HTN (hypertension)   . Hyperlipidemia   . Hypothyroidism, postsurgical   . Lumbar radicular pain    R>L legs  . Migraine headache   . Mild intermittent asthma with allergic rhinitis without complication   . PONV (postoperative nausea and vomiting)   . Rotator cuff syndrome of right shoulder   . Urge urinary incontinence    refractory  . Wears glasses    There were no vitals taken for this visit.  Opioid Risk Score:  7 Fall Risk Score:  `1  Depression screen PHQ 2/9  Depression screen [facility] 2/9 04/14/2017 03/21/2017 01/19/2017 11/10/2016 10/17/2016 09/13/2016 12/22/2015  Decreased Interest 1 3 3 3 3 3  0  Down, Depressed, Hopeless 1 3 3 3 3 3  0  PHQ - 2 Score 2 6 6 6 6 6  0  Altered sleeping - - - - - - -  Tired, decreased energy - - - - - - -  Change in appetite - - - - - - -  Feeling bad or failure about yourself  - - - - - - -  Trouble concentrating - - - - - - -  Moving slowly or fidgety/restless - - - - - - -  Suicidal thoughts - - - - - - -  PHQ-9 Score - - - - - - -  Some recent data might be hidden     Review of Systems  HENT: Negative.   Eyes: Negative.    Respiratory: Negative.   Cardiovascular: Negative.   Gastrointestinal: Negative.   Endocrine: Negative.   Genitourinary:       Bladder control  Musculoskeletal: Positive for gait problem.  Skin: Negative.   Allergic/Immunologic: Negative.   Neurological: Positive for dizziness, tremors, weakness and numbness.  Hematological: Negative.   Psychiatric/Behavioral: Positive for dysphoric mood. The patient is nervous/anxious.   All other systems reviewed and are negative.      Objective:  Physical Exam  Constitutional: She appears well-developed and well-nourished.  HENT:  Head: Normocephalic and atraumatic.  Neck: Normal range of motion. Neck supple.  Cardiovascular: Normal rate and regular rhythm.   Pulmonary/Chest: Effort normal and breath sounds normal.  Musculoskeletal:  Normal Muscle Bulk and Muscle Testing Reveals: Upper Extremities: Full ROM and Muscle Strength 5/5 Right Lateral epicondyle tenderness Lumbar Hypersensitivity Lower Extremities: Full ROM and Muscle Strength 5/5 Arises from Table Slowly using straight cane for support Narrow Based Gait  Skin: Skin is warm and dry.  Psychiatric: She has a normal mood and affect.  Nursing note and vitals reviewed.         Assessment & Plan:  1.History of fibromyalgia. Continue Current Medication Regime and Exercise Regime. Continue Gabapentin. 04/14/2017 2. Chronic low back pain/ Lumbosacral Spondylosis/ Lumbar Facet Arthropathy: Continue HEP as Tolerated. Continue to Monitor Refilled:  Hydrocodone 7.5/325mg  one tablet twice a day as needed for pain # 60 this month. 04/14/2017. We will continue the opioid monitoring program, this consists of regular clinic visits, examinations, urine drug screen, pill counts as well as use of [place] Controlled Substance Reporting System. Continue Neurontin. 3. Cervical Radiculopathy: Continue Gabapentin:  04/14/2017 4. Anxiety and depression. Continue Effexor and Klonopin.  04/14/2017 5. OA: Continue Voltaren Gel. 04/14/2017  20 minutes of face to face patient care time was spent during this visit. All questions were encouraged and answered.  F/U in 85month

## 2017-04-16 ENCOUNTER — Other Ambulatory Visit: Payer: Self-pay | Admitting: Physical Medicine & Rehabilitation

## 2017-04-16 DIAGNOSIS — F418 Other specified anxiety disorders: Secondary | ICD-10-CM

## 2017-04-16 DIAGNOSIS — M797 Fibromyalgia: Secondary | ICD-10-CM

## 2017-04-17 ENCOUNTER — Telehealth: Payer: Self-pay

## 2017-04-17 MED ORDER — NALOXEGOL OXALATE 25 MG PO TABS: 25.0000 mg | ORAL_TABLET | Freq: Every day | ORAL | 2 refills | Status: DC

## 2017-04-17 NOTE — Telephone Encounter (Signed)
Patient called stating that medication is working and thank you

## 2017-04-17 NOTE — Telephone Encounter (Signed)
Movantik ordered , Ms. Dosanjh had good results with samples.

## 2017-05-01 DIAGNOSIS — N3946 Mixed incontinence: Secondary | ICD-10-CM | POA: Diagnosis not present

## 2017-05-01 DIAGNOSIS — R351 Nocturia: Secondary | ICD-10-CM | POA: Diagnosis not present

## 2017-05-01 DIAGNOSIS — R350 Frequency of micturition: Secondary | ICD-10-CM | POA: Diagnosis not present

## 2017-05-02 DIAGNOSIS — I10 Essential (primary) hypertension: Secondary | ICD-10-CM | POA: Diagnosis not present

## 2017-05-02 DIAGNOSIS — E782 Mixed hyperlipidemia: Secondary | ICD-10-CM | POA: Diagnosis not present

## 2017-05-02 DIAGNOSIS — H532 Diplopia: Secondary | ICD-10-CM | POA: Diagnosis not present

## 2017-05-02 DIAGNOSIS — R69 Illness, unspecified: Secondary | ICD-10-CM | POA: Diagnosis not present

## 2017-05-02 DIAGNOSIS — Z0000 Encounter for general adult medical examination without abnormal findings: Secondary | ICD-10-CM | POA: Diagnosis not present

## 2017-05-02 DIAGNOSIS — F339 Major depressive disorder, recurrent, unspecified: Secondary | ICD-10-CM | POA: Diagnosis not present

## 2017-05-02 DIAGNOSIS — E039 Hypothyroidism, unspecified: Secondary | ICD-10-CM | POA: Diagnosis not present

## 2017-05-11 ENCOUNTER — Encounter: Payer: Medicare HMO | Admitting: Registered Nurse

## 2017-05-15 ENCOUNTER — Encounter: Payer: Self-pay | Admitting: Physician Assistant

## 2017-05-23 ENCOUNTER — Other Ambulatory Visit: Payer: Self-pay | Admitting: Registered Nurse

## 2017-05-23 DIAGNOSIS — F418 Other specified anxiety disorders: Secondary | ICD-10-CM

## 2017-05-26 ENCOUNTER — Ambulatory Visit: Payer: Medicare HMO | Admitting: Physician Assistant

## 2017-06-15 ENCOUNTER — Other Ambulatory Visit: Payer: Self-pay | Admitting: Physical Medicine & Rehabilitation

## 2017-06-15 ENCOUNTER — Encounter: Payer: Medicare HMO | Attending: Physical Medicine and Rehabilitation | Admitting: Registered Nurse

## 2017-06-15 ENCOUNTER — Encounter: Payer: Self-pay | Admitting: Registered Nurse

## 2017-06-15 VITALS — BP 132/83 | HR 69 | Resp 14

## 2017-06-15 DIAGNOSIS — M75100 Unspecified rotator cuff tear or rupture of unspecified shoulder, not specified as traumatic: Secondary | ICD-10-CM

## 2017-06-15 DIAGNOSIS — F418 Other specified anxiety disorders: Secondary | ICD-10-CM | POA: Diagnosis not present

## 2017-06-15 DIAGNOSIS — G8929 Other chronic pain: Secondary | ICD-10-CM

## 2017-06-15 DIAGNOSIS — M7918 Myalgia, other site: Secondary | ICD-10-CM

## 2017-06-15 DIAGNOSIS — G894 Chronic pain syndrome: Secondary | ICD-10-CM

## 2017-06-15 DIAGNOSIS — M797 Fibromyalgia: Secondary | ICD-10-CM

## 2017-06-15 DIAGNOSIS — R69 Illness, unspecified: Secondary | ICD-10-CM | POA: Diagnosis not present

## 2017-06-15 DIAGNOSIS — M546 Pain in thoracic spine: Secondary | ICD-10-CM | POA: Diagnosis not present

## 2017-06-15 DIAGNOSIS — M542 Cervicalgia: Secondary | ICD-10-CM | POA: Diagnosis not present

## 2017-06-15 DIAGNOSIS — M47817 Spondylosis without myelopathy or radiculopathy, lumbosacral region: Secondary | ICD-10-CM | POA: Diagnosis not present

## 2017-06-15 DIAGNOSIS — M5412 Radiculopathy, cervical region: Secondary | ICD-10-CM | POA: Diagnosis not present

## 2017-06-15 DIAGNOSIS — Z5181 Encounter for therapeutic drug level monitoring: Secondary | ICD-10-CM | POA: Diagnosis not present

## 2017-06-15 DIAGNOSIS — M47816 Spondylosis without myelopathy or radiculopathy, lumbar region: Secondary | ICD-10-CM

## 2017-06-15 DIAGNOSIS — Z79899 Other long term (current) drug therapy: Secondary | ICD-10-CM | POA: Diagnosis not present

## 2017-06-15 DIAGNOSIS — M19011 Primary osteoarthritis, right shoulder: Secondary | ICD-10-CM | POA: Diagnosis not present

## 2017-06-15 MED ORDER — HYDROCODONE-ACETAMINOPHEN 7.5-325 MG PO TABS: 1.0000 | ORAL_TABLET | Freq: Two times a day (BID) | ORAL | 0 refills | Status: DC | PRN

## 2017-06-15 MED ORDER — GABAPENTIN 300 MG PO CAPS: 300.0000 mg | ORAL_CAPSULE | Freq: Four times a day (QID) | ORAL | 3 refills | Status: DC

## 2017-06-15 NOTE — Patient Instructions (Signed)
Increase Gabapentin to 4 times a day, Call Office in a week to evaluate medication

## 2017-06-15 NOTE — Progress Notes (Signed)
Subjective:    Patient ID: [name] [name], female    DOB: 08/26/1945, 71 y.o.   MRN: [id number]  HPI: [name] is a 71year old female who returns for follow up appointmentfor chronic pain and medication refill.She states her pain is located in her neck,right shoulder with tingling and burning radiating into her right arm to her elbow and lower back pain. [name] [name] picked up her Hydrocodone on 04/14/2017, she missed her last appointment due to husband surgery. [name] [name] has an allergy to Codeine T&C can't be prescribed, would have prescribe Tramadol due to Effexor Dose and the possibility of Serotonin Syndrome I will resume Hydrocodone due to the possibility ofadverse effects. She verbalizes understanding. Also placed a call to the Pharmacist at [facility] regarding the above.   She rates her pain 6.Her current exercise regime is walking and performing stretching exercises.  [name] [name] Morphine equivalent is 15 MME.She is also prescribed Klonopin. We have reviewed the black box warning regarding using opioids and benzodiazepines. I highlighted the dangers of using these drugs together and discussed the adverse events including respiratory suppression, overdose, cognitive impairment and importance of  compliance with current regimen. She verbalizes understanding, we will continue to monitor and adjust as indicated.    [name] [name] has an appointment with [facility] on 07/21/2017.    [name] [name] Oral Swab was performed  on 02/15/2017 it was consistent.  Pain Inventory Average Pain 7 Pain Right Now 6 My pain is dull, tingling and aching  In the last 24 hours, has pain interfered with the following? General activity 5 Relation with others 6 Enjoyment of life 7 What TIME of day is your pain at its worst? morning, daytime, evening Sleep (in general) Poor  Pain is worse with: bending, sitting, standing and some activites Pain improves with:  rest, heat/ice, therapy/exercise, medication and injections Relief from Meds: 6  Mobility walk with assistance use a cane how many minutes can you walk? 20 ability to climb steps?  yes do you drive?  yes Do you have any goals in this area?  yes  Function retired I need assistance with the following:  household duties and shopping Do you have any goals in this area?  yes  Neuro/Psych bowel control problems weakness numbness tremor trouble walking dizziness depression anxiety  Prior Studies Any changes since last visit?  no  Physicians involved in your care Any changes since last visit?  no   Family History  Problem Relation Age of Onset  . Cancer Unknown        breast -- grandmother  . Cancer Unknown        lung -- uncles (3)  . Leukemia Sister   . Diabetes Father   . Coronary artery disease Father   . Diabetes Mother    Social History   Socioeconomic History  . Marital status: Married    Spouse name: None  . Number of children: None  . Years of education: None  . Highest education level: None  Social Needs  . Financial resource strain: None  . Food insecurity - worry: None  . Food insecurity - inability: None  . Transportation needs - medical: None  . Transportation needs - non-medical: None  Occupational History  . None  Tobacco Use  . Smoking status: Never Smoker  . Smokeless tobacco: Never Used  Substance and Sexual Activity  . Alcohol use: No  . Drug use: No  .  Sexual activity: None  Other Topics Concern  . None  Social History Narrative  . None   Past Surgical History:  Procedure Laterality Date  . CARDIAC CATHETERIZATION  01-17-2003  dr Darnell Level brodie   normal coronary arteries and LVF,  ef 60%  . EXCISION MORTON'S NEUROMA  2002   bilateral feet  . EXCISION OF BREAST BIOPSY Right 1990's   benign  . INTERSTIM IMPLANT PLACEMENT N/A 02/10/2015   Procedure: Barrie Lyme IMPLANT FIRST STAGE;  Surgeon: Bjorn Loser, MD;  Location: [facility];  Service: Urology;  Laterality: N/A;  . INTERSTIM IMPLANT PLACEMENT N/A 02/10/2015   Procedure: Barrie Lyme IMPLANT SECOND STAGE;  Surgeon: Bjorn Loser, MD;  Location: [facility];  Service: Urology;  Laterality: N/A;  . LAPAROSCOPIC CHOLECYSTECTOMY  2000  . NEGATIVE SLEEP STUDY  2000   per pt  . ORIF ANKLE FRACTURE  01/10/2012   Procedure: OPEN REDUCTION INTERNAL FIXATION (ORIF) ANKLE FRACTURE;  Surgeon: Sanjuana Kava, MD;  Location: [facility];  Service: Orthopedics;  Laterality: Left;  . TOTAL THYROIDECTOMY  1985   benign tumor  . TRANSTHORACIC ECHOCARDIOGRAM  03-02-2009   normal echo,  ef 55-60%  . VAGINAL HYSTERECTOMY  1984  . WRIST SURGERY Left 2000   Past Medical History:  Diagnosis Date  . Anxiety   . Cervical radiculopathy   . Chronic back pain    R > L  . Chronic neck pain   . Depression   . Dysphagia    functional --  takes small bites  . Fibromyalgia   . GERD (gastroesophageal reflux disease)   . Hemorrhoids   . History of ankle fracture 01/08/2012   Status post open reduction internal fixation of a left ankle trimalleolar fracture by Dr. Iona Hansen on 01/10/2012.    Marland Kitchen History of benign thyroid tumor   . History of diabetes mellitus, type II    was diet controlled -- per pt pcp she was no longer diabetic  . History of hiatal hernia   . History of TIA (transient ischemic attack)    02-27-2009  no residual  . HTN (hypertension)   . Hyperlipidemia   . Hypothyroidism, postsurgical   . Lumbar radicular pain    R>L legs  . Migraine headache   . Mild intermittent asthma with allergic rhinitis without complication   . PONV (postoperative nausea and vomiting)   . Rotator cuff syndrome of right shoulder   . Urge urinary incontinence    refractory  . Wears glasses    There were no vitals taken for this visit.  Opioid Risk Score:  7 Fall Risk Score:  `1  Depression screen PHQ 2/9  Depression screen [facility] 2/9 04/14/2017 03/21/2017  01/19/2017 11/10/2016 10/17/2016 09/13/2016 12/22/2015  Decreased Interest 1 3 3 3 3 3  0  Down, Depressed, Hopeless 1 3 3 3 3 3  0  PHQ - 2 Score 2 6 6 6 6 6  0  Altered sleeping - - - - - - -  Tired, decreased energy - - - - - - -  Change in appetite - - - - - - -  Feeling bad or failure about yourself  - - - - - - -  Trouble concentrating - - - - - - -  Moving slowly or fidgety/restless - - - - - - -  Suicidal thoughts - - - - - - -  PHQ-9 Score - - - - - - -  Some recent data might  be hidden     Review of Systems  Constitutional: Positive for appetite change, chills, fever and unexpected weight change.  HENT: Negative.   Eyes: Negative.   Respiratory: Positive for cough, shortness of breath and wheezing.   Cardiovascular: Positive for leg swelling.  Gastrointestinal: Positive for diarrhea and nausea.  Endocrine: Negative.   Genitourinary: Positive for dysuria.       Bladder control  Musculoskeletal: Positive for arthralgias, back pain and gait problem.  Skin: Positive for rash.  Allergic/Immunologic: Negative.   Neurological: Positive for dizziness, tremors, weakness and numbness.  Hematological: Negative.   Psychiatric/Behavioral: Positive for dysphoric mood. The patient is nervous/anxious.   All other systems reviewed and are negative.      Objective:   Physical Exam  Constitutional: She appears well-developed and well-nourished.  HENT:  Head: Normocephalic and atraumatic.  Neck: Normal range of motion. Neck supple.  Cervical Paraspinal Tenderness: C-5-C-6  Cardiovascular: Normal rate and regular rhythm.  Pulmonary/Chest: Effort normal and breath sounds normal.  Musculoskeletal:  Normal Muscle Bulk and Muscle Testing Reveals: Upper Extremities: Full ROM and Muscle Strength 5/5 Thoracic Paraspinal Tenderness: T-7-T-9 Lumbar Paraspinal Tenderness: L-4-L-5 Lower Extremities: Full ROM and Muscle Strength 5/5 Arises from Table Slowly using straight cane for support Narrow  Based Gait  Skin: Skin is warm and dry.  Psychiatric: She has a normal mood and affect.  Nursing note and vitals reviewed.         Assessment & Plan:  1.History of fibromyalgia. Continue Current Medication Regime and Exercise Regime. Continue Gabapentin. 04/14/2017 2. Chronic low back pain/ Lumbosacral Spondylosis/ Lumbar Facet Arthropathy: Continue HEP as Tolerated. Continue to Monitor Refilled:  Hydrocodone 7.5/325mg  one tablet twice a day as needed for pain # 60 this month. 04/14/2017. We will continue the opioid monitoring program, this consists of regular clinic visits, examinations, urine drug screen, pill counts as well as use of [place] Controlled Substance Reporting System. Continue Neurontin. 3. Cervical Radiculopathy: Continue Gabapentin:  04/14/2017 4. Anxiety and depression. Continue Effexor and Klonopin. 04/14/2017 5. OA: Continue Voltaren Gel. 04/14/2017  20 minutes of face to face patient care time was spent during this visit. All questions were encouraged and answered.  F/U in 31month

## 2017-07-17 ENCOUNTER — Encounter: Payer: Medicare HMO | Attending: Physical Medicine and Rehabilitation | Admitting: Registered Nurse

## 2017-07-17 ENCOUNTER — Encounter: Payer: Medicare HMO | Admitting: Physical Medicine & Rehabilitation

## 2017-07-17 VITALS — BP 154/81 | HR 78

## 2017-07-17 DIAGNOSIS — M47817 Spondylosis without myelopathy or radiculopathy, lumbosacral region: Secondary | ICD-10-CM

## 2017-07-17 DIAGNOSIS — M5412 Radiculopathy, cervical region: Secondary | ICD-10-CM

## 2017-07-17 DIAGNOSIS — M47816 Spondylosis without myelopathy or radiculopathy, lumbar region: Secondary | ICD-10-CM

## 2017-07-17 DIAGNOSIS — F418 Other specified anxiety disorders: Secondary | ICD-10-CM | POA: Diagnosis not present

## 2017-07-17 DIAGNOSIS — M797 Fibromyalgia: Secondary | ICD-10-CM

## 2017-07-17 DIAGNOSIS — M7918 Myalgia, other site: Secondary | ICD-10-CM

## 2017-07-17 DIAGNOSIS — M542 Cervicalgia: Secondary | ICD-10-CM

## 2017-07-17 DIAGNOSIS — Z79899 Other long term (current) drug therapy: Secondary | ICD-10-CM | POA: Insufficient documentation

## 2017-07-17 DIAGNOSIS — G894 Chronic pain syndrome: Secondary | ICD-10-CM | POA: Diagnosis not present

## 2017-07-17 DIAGNOSIS — M19011 Primary osteoarthritis, right shoulder: Secondary | ICD-10-CM | POA: Insufficient documentation

## 2017-07-17 DIAGNOSIS — Z5181 Encounter for therapeutic drug level monitoring: Secondary | ICD-10-CM | POA: Insufficient documentation

## 2017-07-17 DIAGNOSIS — R69 Illness, unspecified: Secondary | ICD-10-CM | POA: Diagnosis not present

## 2017-07-17 MED ORDER — HYDROCODONE-ACETAMINOPHEN 7.5-325 MG PO TABS: 1.0000 | ORAL_TABLET | Freq: Two times a day (BID) | ORAL | 0 refills | Status: DC | PRN

## 2017-07-17 MED ORDER — CLONAZEPAM 1 MG PO TABS: ORAL_TABLET | ORAL | 2 refills | Status: DC

## 2017-07-17 NOTE — Progress Notes (Signed)
Subjective:    Patient ID: [name] [name], female    DOB: 1946-01-01, 72 y.o.   MRN: [id number]  HPI: [name] [name] is a 72year old female who returns for follow up appointmentfor chronic pain and medication refill.She states her pain is located in her neck radiating into her bilateral shoulders and bilateral upper extremities with tingling and burning and lower back pain with increase activity and long periods of  Standing. We disc She rates her pain 5.Her current exercise regime is walking and performing stretching exercises.  [name] [name] states she has notice decrease right peripheral vision, she has an appointment with neurologist on 07/21/2017. She was instructed not to drive until she is clear by the Neurologist, also awaiting an appointment with Opthamolgist she verbalizes understanding.   Also states a week ago she was walking and twisted her left ankle, denies falling. Wearing ankle compression sleeve, she didn't seek medical attention she reports.   [name] [name] Morphine equivalent is 14.00 MME.She is also prescribed Klonopin. We have reviewed the black box warning again regarding using opioids and benzodiazepines. I highlighted the dangers of using these drugs together and discussed the adverse events including respiratory suppression, overdose, cognitive impairment and importance of compliance with current regimen. She verbalizes understanding, we will continue to monitor and adjust as indicated.    [name] [name] has an appointment with [facility] on 07/19/2017.  [name] [name] has an appointment with Neurologist on 07/21/2017 Dr. Sharlet Salina.    Ms. Senk Oral Swab was performed  on 02/15/2017 it was consistent.  Pain Inventory Average Pain 7 Pain Right Now 5 My pain is constant, sharp, dull, tingling and aching  In the last 24 hours, has pain interfered with the following? General activity 7 Relation with others 6 Enjoyment of life 6 What TIME of day is your  pain at its worst? morning, daytime, evening Sleep (in general) Poor  Pain is worse with: bending, sitting, standing and some activites Pain improves with: rest, heat/ice, therapy/exercise, medication and injections Relief from Meds: 7  Mobility walk with assistance use a cane how many minutes can you walk? 20 ability to climb steps?  yes do you drive?  yes Do you have any goals in this area?  yes  Function retired I need assistance with the following:  household duties and shopping Do you have any goals in this area?  yes  Neuro/Psych bowel control problems weakness numbness tremor trouble walking dizziness depression anxiety  Prior Studies Any changes since last visit?  no  Physicians involved in your care Any changes since last visit?  no   Family History  Problem Relation Age of Onset  . Cancer Unknown        breast -- grandmother  . Cancer Unknown        lung -- uncles (3)  . Leukemia Sister   . Diabetes Father   . Coronary artery disease Father   . Diabetes Mother    Social History   Socioeconomic History  . Marital status: Married    Spouse name: Not on file  . Number of children: Not on file  . Years of education: Not on file  . Highest education level: Not on file  Social Needs  . Financial resource strain: Not on file  . Food insecurity - worry: Not on file  . Food insecurity - inability: Not on file  . Transportation needs - medical: Not on file  . Transportation needs - non-medical:  Not on file  Occupational History  . Not on file  Tobacco Use  . Smoking status: Never Smoker  . Smokeless tobacco: Never Used  Substance and Sexual Activity  . Alcohol use: No  . Drug use: No  . Sexual activity: Not on file  Other Topics Concern  . Not on file  Social History Narrative  . Not on file   Past Surgical History:  Procedure Laterality Date  . CARDIAC CATHETERIZATION  01-17-2003  dr Darnell Level brodie   normal coronary arteries and LVF,  ef  60%  . EXCISION MORTON'S NEUROMA  2002   bilateral feet  . EXCISION OF BREAST BIOPSY Right 1990's   benign  . INTERSTIM IMPLANT PLACEMENT N/A 02/10/2015   Procedure: Barrie Lyme IMPLANT FIRST STAGE;  Surgeon: Bjorn Loser, MD;  Location: [facility];  Service: Urology;  Laterality: N/A;  . INTERSTIM IMPLANT PLACEMENT N/A 02/10/2015   Procedure: Barrie Lyme IMPLANT SECOND STAGE;  Surgeon: Bjorn Loser, MD;  Location: [facility];  Service: Urology;  Laterality: N/A;  . LAPAROSCOPIC CHOLECYSTECTOMY  2000  . NEGATIVE SLEEP STUDY  2000   per pt  . ORIF ANKLE FRACTURE  01/10/2012   Procedure: OPEN REDUCTION INTERNAL FIXATION (ORIF) ANKLE FRACTURE;  Surgeon: Sanjuana Kava, MD;  Location: [facility];  Service: Orthopedics;  Laterality: Left;  . TOTAL THYROIDECTOMY  1985   benign tumor  . TRANSTHORACIC ECHOCARDIOGRAM  03-02-2009   normal echo,  ef 55-60%  . VAGINAL HYSTERECTOMY  1984  . WRIST SURGERY Left 2000   Past Medical History:  Diagnosis Date  . Anxiety   . Cervical radiculopathy   . Chronic back pain    R > L  . Chronic neck pain   . Depression   . Dysphagia    functional --  takes small bites  . Fibromyalgia   . GERD (gastroesophageal reflux disease)   . Hemorrhoids   . History of ankle fracture 01/08/2012   Status post open reduction internal fixation of a left ankle trimalleolar fracture by Dr. Iona Hansen on 01/10/2012.    Marland Kitchen History of benign thyroid tumor   . History of diabetes mellitus, type II    was diet controlled -- per pt pcp she was no longer diabetic  . History of hiatal hernia   . History of TIA (transient ischemic attack)    02-27-2009  no residual  . HTN (hypertension)   . Hyperlipidemia   . Hypothyroidism, postsurgical   . Lumbar radicular pain    R>L legs  . Migraine headache   . Mild intermittent asthma with allergic rhinitis without complication   . PONV (postoperative nausea and vomiting)   . Rotator cuff syndrome of  right shoulder   . Urge urinary incontinence    refractory  . Wears glasses    There were no vitals taken for this visit.  Opioid Risk Score:  7 Fall Risk Score:  `1  Depression screen PHQ 2/9  Depression screen [facility] 2/9 04/14/2017 03/21/2017 01/19/2017 11/10/2016 10/17/2016 09/13/2016 12/22/2015  Decreased Interest 1 3 3 3 3 3  0  Down, Depressed, Hopeless 1 3 3 3 3 3  0  PHQ - 2 Score 2 6 6 6 6 6  0  Altered sleeping - - - - - - -  Tired, decreased energy - - - - - - -  Change in appetite - - - - - - -  Feeling bad or failure about yourself  - - - - - - -  Trouble concentrating - - - - - - -  Moving slowly or fidgety/restless - - - - - - -  Suicidal thoughts - - - - - - -  PHQ-9 Score - - - - - - -  Some recent data might be hidden     Review of Systems  Constitutional: Positive for appetite change, chills, fever and unexpected weight change.  HENT: Negative.   Eyes: Negative.   Respiratory: Positive for cough, shortness of breath and wheezing.   Cardiovascular: Positive for leg swelling.  Gastrointestinal: Positive for diarrhea and nausea.  Endocrine: Negative.   Genitourinary: Positive for dysuria.       Bladder control  Musculoskeletal: Positive for arthralgias, back pain and gait problem.  Skin: Positive for rash.  Allergic/Immunologic: Negative.   Neurological: Positive for dizziness, tremors, weakness and numbness.  Hematological: Negative.   Psychiatric/Behavioral: Positive for dysphoric mood. The patient is nervous/anxious.   All other systems reviewed and are negative.      Objective:   Physical Exam  Constitutional: She appears well-developed and well-nourished.  HENT:  Head: Normocephalic and atraumatic.  Neck: Normal range of motion. Neck supple.  Cervical Paraspinal Tenderness: C-5-C-6  Cardiovascular: Normal rate and regular rhythm.  Pulmonary/Chest: Effort normal and breath sounds normal.  Musculoskeletal:  Normal Muscle Bulk and Muscle Testing  Reveals: Upper Extremities: Full ROM and Muscle Strength 5/5 Right AC Joint [place] Lumbar Paraspinal Tenderness: L-3-L-5 Lower Extremities: Full ROM and Muscle Strength 5/5 Arises from Table Slowly using straight cane for support Narrow Based Gait  Skin: Skin is warm and dry.  Psychiatric: She has a normal mood and affect.  Nursing note and vitals reviewed.         Assessment & Plan:  1.History of fibromyalgia. Continue Current Medication Regime and Exercise Regime. Continue Gabapentin. 07/17/2017 2. Chronic low back pain/ Lumbosacral Spondylosis/ Lumbar Facet Arthropathy: Continue HEP as Tolerated. Continue to Monitor Refilled:  Hydrocodone 7.5/325mg  one tablet twice a day as needed for pain # 60 this month. 07/17/2017. We will continue the opioid monitoring program, this consists of regular clinic visits, examinations, urine drug screen, pill counts as well as use of [place] Controlled Substance Reporting System. Continue Neurontin. 3. Cervical Radiculopathy: Continue Gabapentin:  07/17/2017 4. Anxiety and depression. Continue Effexor and Klonopin. 07/17/2017 5. OA: Continue Voltaren Gel. 07/17/2017  20 minutes of face to face patient care time was spent during this visit. All questions were encouraged and answered.  F/U in 2month

## 2017-07-18 ENCOUNTER — Encounter: Payer: Self-pay | Admitting: Registered Nurse

## 2017-07-21 DIAGNOSIS — F5105 Insomnia due to other mental disorder: Secondary | ICD-10-CM | POA: Diagnosis not present

## 2017-07-21 DIAGNOSIS — R69 Illness, unspecified: Secondary | ICD-10-CM | POA: Diagnosis not present

## 2017-07-21 DIAGNOSIS — F439 Reaction to severe stress, unspecified: Secondary | ICD-10-CM | POA: Diagnosis not present

## 2017-07-21 DIAGNOSIS — F411 Generalized anxiety disorder: Secondary | ICD-10-CM | POA: Diagnosis not present

## 2017-08-02 ENCOUNTER — Encounter: Payer: Medicare HMO | Admitting: Physical Medicine & Rehabilitation

## 2017-08-02 ENCOUNTER — Other Ambulatory Visit: Payer: Self-pay | Admitting: Nurse Practitioner

## 2017-08-02 DIAGNOSIS — Z1231 Encounter for screening mammogram for malignant neoplasm of breast: Secondary | ICD-10-CM

## 2017-08-11 DIAGNOSIS — I10 Essential (primary) hypertension: Secondary | ICD-10-CM | POA: Diagnosis not present

## 2017-08-11 DIAGNOSIS — H532 Diplopia: Secondary | ICD-10-CM | POA: Diagnosis not present

## 2017-08-11 DIAGNOSIS — H259 Unspecified age-related cataract: Secondary | ICD-10-CM | POA: Diagnosis not present

## 2017-08-11 DIAGNOSIS — H5702 Anisocoria: Secondary | ICD-10-CM | POA: Diagnosis not present

## 2017-08-16 ENCOUNTER — Encounter: Payer: Self-pay | Admitting: Physical Medicine & Rehabilitation

## 2017-08-16 ENCOUNTER — Encounter: Payer: Medicare HMO | Attending: Physical Medicine and Rehabilitation | Admitting: Physical Medicine & Rehabilitation

## 2017-08-16 VITALS — BP 145/78 | HR 64

## 2017-08-16 DIAGNOSIS — M47817 Spondylosis without myelopathy or radiculopathy, lumbosacral region: Secondary | ICD-10-CM

## 2017-08-16 DIAGNOSIS — M797 Fibromyalgia: Secondary | ICD-10-CM

## 2017-08-16 DIAGNOSIS — M47816 Spondylosis without myelopathy or radiculopathy, lumbar region: Secondary | ICD-10-CM

## 2017-08-16 DIAGNOSIS — Z5181 Encounter for therapeutic drug level monitoring: Secondary | ICD-10-CM | POA: Insufficient documentation

## 2017-08-16 DIAGNOSIS — M5412 Radiculopathy, cervical region: Secondary | ICD-10-CM

## 2017-08-16 DIAGNOSIS — M19011 Primary osteoarthritis, right shoulder: Secondary | ICD-10-CM | POA: Insufficient documentation

## 2017-08-16 DIAGNOSIS — Z79899 Other long term (current) drug therapy: Secondary | ICD-10-CM | POA: Diagnosis not present

## 2017-08-16 DIAGNOSIS — M7918 Myalgia, other site: Secondary | ICD-10-CM | POA: Diagnosis not present

## 2017-08-16 DIAGNOSIS — H547 Unspecified visual loss: Secondary | ICD-10-CM | POA: Diagnosis not present

## 2017-08-16 MED ORDER — HYDROCODONE-ACETAMINOPHEN 7.5-325 MG PO TABS: 1.0000 | ORAL_TABLET | Freq: Two times a day (BID) | ORAL | 0 refills | Status: DC | PRN

## 2017-08-16 NOTE — Progress Notes (Signed)
Subjective:    Patient ID: [name] [name], female    DOB: 1946/07/11, 72 y.o.   MRN: [id number]  HPI   [name] [name] is here in follow up of her chronic pain.  She is now seeing a psychiatrist and this physician told her that he felt that she was bipolar.  She started on Lamictal which she is taking at nighttime currently.  She feels that it may be helping somewhat.  She still uses the Effexor (which may be decreased).  She also takes Klonopin at night to help her relax to get to sleep.  She has been making efforts to lose weight.  She is trying to walk and exercise more in general.  More recently she has been having problems with her vision and reports peripheral vision loss to the right.  She also states that her visual acuity is more effective for items near and far.  She notices that items "cup" when she looks that them up close.  Specifically she referred to plates and dishware that she uses daily.  From a pain standpoint she is been doing fairly well.  She remains on gabapentin as well as the hydrocodone for breakthrough symptoms.  She feels that given some of the changes noted above that her pain is been less prominent.  Pain Inventory Average Pain 8 Pain Right Now 4 My pain is sharp and burning  In the last 24 hours, has pain interfered with the following? General activity 4 Relation with others 4 Enjoyment of life 4 What TIME of day is your pain at its worst? night Sleep (in general) Good  Pain is worse with: bending, inactivity, standing and some activites Pain improves with: rest, heat/ice, therapy/exercise, medication and injections Relief from Meds: 8  Mobility use a cane ability to climb steps?  yes do you drive?  no  Function retired  Neuro/Psych bladder control problems bowel control problems numbness tingling trouble walking dizziness depression anxiety  Prior Studies Any changes since last visit?  no  Physicians involved in your care Any changes since  last visit?  no   Family History  Problem Relation Age of Onset  . Cancer Unknown        breast -- grandmother  . Cancer Unknown        lung -- uncles (3)  . Leukemia Sister   . Diabetes Father   . Coronary artery disease Father   . Diabetes Mother    Social History   Socioeconomic History  . Marital status: Married    Spouse name: None  . Number of children: None  . Years of education: None  . Highest education level: None  Social Needs  . Financial resource strain: None  . Food insecurity - worry: None  . Food insecurity - inability: None  . Transportation needs - medical: None  . Transportation needs - non-medical: None  Occupational History  . None  Tobacco Use  . Smoking status: Never Smoker  . Smokeless tobacco: Never Used  Substance and Sexual Activity  . Alcohol use: No  . Drug use: No  . Sexual activity: None  Other Topics Concern  . None  Social History Narrative  . None   Past Surgical History:  Procedure Laterality Date  . CARDIAC CATHETERIZATION  01-17-2003  dr Darnell Level brodie   normal coronary arteries and LVF,  ef 60%  . EXCISION MORTON'S NEUROMA  2002   bilateral feet  . EXCISION OF BREAST BIOPSY Right 1990's   benign  .  INTERSTIM IMPLANT PLACEMENT N/A 02/10/2015   Procedure: Barrie Lyme IMPLANT FIRST STAGE;  Surgeon: Bjorn Loser, MD;  Location: [facility];  Service: Urology;  Laterality: N/A;  . INTERSTIM IMPLANT PLACEMENT N/A 02/10/2015   Procedure: Barrie Lyme IMPLANT SECOND STAGE;  Surgeon: Bjorn Loser, MD;  Location: [facility];  Service: Urology;  Laterality: N/A;  . LAPAROSCOPIC CHOLECYSTECTOMY  2000  . NEGATIVE SLEEP STUDY  2000   per pt  . ORIF ANKLE FRACTURE  01/10/2012   Procedure: OPEN REDUCTION INTERNAL FIXATION (ORIF) ANKLE FRACTURE;  Surgeon: Sanjuana Kava, MD;  Location: [facility];  Service: Orthopedics;  Laterality: Left;  . TOTAL THYROIDECTOMY  1985   benign tumor  . TRANSTHORACIC ECHOCARDIOGRAM   03-02-2009   normal echo,  ef 55-60%  . VAGINAL HYSTERECTOMY  1984  . WRIST SURGERY Left 2000   Past Medical History:  Diagnosis Date  . Anxiety   . Cervical radiculopathy   . Chronic back pain    R > L  . Chronic neck pain   . Depression   . Dysphagia    functional --  takes small bites  . Fibromyalgia   . GERD (gastroesophageal reflux disease)   . Hemorrhoids   . History of ankle fracture 01/08/2012   Status post open reduction internal fixation of a left ankle trimalleolar fracture by Dr. Iona Hansen on 01/10/2012.    Marland Kitchen History of benign thyroid tumor   . History of diabetes mellitus, type II    was diet controlled -- per pt pcp she was no longer diabetic  . History of hiatal hernia   . History of TIA (transient ischemic attack)    02-27-2009  no residual  . HTN (hypertension)   . Hyperlipidemia   . Hypothyroidism, postsurgical   . Lumbar radicular pain    R>L legs  . Migraine headache   . Mild intermittent asthma with allergic rhinitis without complication   . PONV (postoperative nausea and vomiting)   . Rotator cuff syndrome of right shoulder   . Urge urinary incontinence    refractory  . Wears glasses    BP (!) 145/78   Pulse 64   SpO2 97%   Opioid Risk Score:   Fall Risk Score:  `1  Depression screen PHQ 2/9  Depression screen [facility] 2/9 04/14/2017 03/21/2017 01/19/2017 11/10/2016 10/17/2016 09/13/2016 12/22/2015  Decreased Interest 1 3 3 3 3 3  0  Down, Depressed, Hopeless 1 3 3 3 3 3  0  PHQ - 2 Score 2 6 6 6 6 6  0  Altered sleeping - - - - - - -  Tired, decreased energy - - - - - - -  Change in appetite - - - - - - -  Feeling bad or failure about yourself  - - - - - - -  Trouble concentrating - - - - - - -  Moving slowly or fidgety/restless - - - - - - -  Suicidal thoughts - - - - - - -  PHQ-9 Score - - - - - - -  Some recent data might be hidden     Review of Systems  Constitutional: Positive for chills, fever and unexpected weight change.    Respiratory: Positive for cough.   Gastrointestinal: Positive for abdominal pain and constipation.  Musculoskeletal: Positive for back pain and gait problem.  Skin: Positive for rash.  Hematological: Bruises/bleeds easily.  All other systems reviewed and are negative.      Objective:  Physical Exam  Constitutional: She is oriented to person, place, and time.  No distress.  Appears to have lost weight.. .  HENT:  Head: Normocephalic and atraumatic.  Neck: Normal range of motion. Neck supple. Fair ROM today  Cardiovascular:  Regular rateusculoskeletal: right wrist with generalized tenderness. Pain along common extensor tendon right elbow.   She has general lumbar spine tenderness with palpation.  Seem to arise more easily from the chair.  She uses her straight cane to help offload her right side but seem to be doing even better without them and she is relying more on her legs for support.  She has some ongoing tenderness at the left ankle as well but it seems to be improved.    Neurological: Did in formal visual field testing today and patient was able to identify objects/movement in all 4 quadrants.  She did not seem to have a preference for the left side either.  I saw no obvious inattention.  Visual acuity is poor for items of far and somewhat for items up close as well.  She remains alert and generally oriented.  Balance was steady today.  Motor exam is intact at 4+ to 5 out of 5 except for occasional pain inhibition.  No sensory findings noted.   Skin: Skin is warm and dry.  Psychiatric: She is pleasant but remains anxious.  .       Assessment & Plan:  1.History of fibromyalgia. . Continue Gabapentin -maintain HEP as possible 2. Chronic low back pain.  - She has DDD at L2-3 and L5-S1 most predominantly. She also has associated facet disease  -Seems to be doing better with her weight loss and increased activity.  Better control of her  emotions also should help her from a pain standpoint. -continuehydrocodone 7.5mg  q8 prn #60. We will continue the opioid monitoring program, this consists of regular clinic visits, examinations, routine drug screening, pill counts as well as use of [place] Controlled Substance Reporting System. [place] today.     -Continue Voltaren gel, Neurontin and Zanaflex  3. Rotator cuff syndrome./right bicipital tendonitis :has chronic right arm painContinue with heat and exercise as tolerated.  4. Cervical Radiculopathy: gabapentin 300mg  TID has been fairly effective and tolerable 5. Left ankle pain/prior fracture--ortho has nothing new to offer. 6. Right wrist pain: mild tendonitis/OA. Mild CTS?---although sx and exam is inconsistent.  -voltaren gel  7. Anxiety and depression. ?bipolar.    -on lamictal now?   -Continue Effexor (reduced dose?)   - Klonopin at night.  8. Insomnia: I won't increase klonopin.              -suggested use of melatonin             -consider sleep study 9. Lateral epicondylitis right             -exercises were provided, Discussed ice/forearm band             -can inject if needed 10. Visual loss:   -don't see frank field cut on exam   -made referral to Dr. Zigmund Daniel to assess   -?attentional issue  44minutes of face to face patient care time was spent during this visit with patient and daughter. All questions were encouraged and answered.   F/U in 42month with NP

## 2017-08-16 NOTE — Patient Instructions (Signed)
PLEASE FEEL FREE TO CALL OUR OFFICE WITH ANY PROBLEMS OR QUESTIONS ([phone])      

## 2017-08-18 DIAGNOSIS — F5105 Insomnia due to other mental disorder: Secondary | ICD-10-CM | POA: Diagnosis not present

## 2017-08-18 DIAGNOSIS — I10 Essential (primary) hypertension: Secondary | ICD-10-CM | POA: Diagnosis not present

## 2017-08-18 DIAGNOSIS — F4310 Post-traumatic stress disorder, unspecified: Secondary | ICD-10-CM | POA: Diagnosis not present

## 2017-08-18 DIAGNOSIS — R69 Illness, unspecified: Secondary | ICD-10-CM | POA: Diagnosis not present

## 2017-08-18 DIAGNOSIS — F411 Generalized anxiety disorder: Secondary | ICD-10-CM | POA: Diagnosis not present

## 2017-08-21 LAB — TOXASSURE SELECT,+ANTIDEPR,UR

## 2017-08-24 ENCOUNTER — Telehealth: Payer: Self-pay | Admitting: *Deleted

## 2017-08-24 NOTE — Telephone Encounter (Signed)
Urine drug screen for this encounter is consistent for prescribed medication 

## 2017-09-05 DIAGNOSIS — H532 Diplopia: Secondary | ICD-10-CM | POA: Diagnosis not present

## 2017-09-05 DIAGNOSIS — H04123 Dry eye syndrome of bilateral lacrimal glands: Secondary | ICD-10-CM | POA: Diagnosis not present

## 2017-09-05 DIAGNOSIS — H2513 Age-related nuclear cataract, bilateral: Secondary | ICD-10-CM | POA: Diagnosis not present

## 2017-09-05 DIAGNOSIS — E119 Type 2 diabetes mellitus without complications: Secondary | ICD-10-CM | POA: Diagnosis not present

## 2017-09-06 ENCOUNTER — Other Ambulatory Visit: Payer: Self-pay | Admitting: Physical Medicine & Rehabilitation

## 2017-09-06 DIAGNOSIS — F418 Other specified anxiety disorders: Secondary | ICD-10-CM

## 2017-09-06 DIAGNOSIS — M797 Fibromyalgia: Secondary | ICD-10-CM

## 2017-09-09 DIAGNOSIS — D649 Anemia, unspecified: Secondary | ICD-10-CM | POA: Diagnosis not present

## 2017-09-09 DIAGNOSIS — Z0100 Encounter for examination of eyes and vision without abnormal findings: Secondary | ICD-10-CM | POA: Diagnosis not present

## 2017-09-09 DIAGNOSIS — I10 Essential (primary) hypertension: Secondary | ICD-10-CM | POA: Diagnosis not present

## 2017-09-13 ENCOUNTER — Encounter: Payer: Medicare HMO | Attending: Physical Medicine and Rehabilitation | Admitting: Registered Nurse

## 2017-09-13 ENCOUNTER — Encounter: Payer: Self-pay | Admitting: Registered Nurse

## 2017-09-13 VITALS — BP 117/64 | HR 77

## 2017-09-13 DIAGNOSIS — M19011 Primary osteoarthritis, right shoulder: Secondary | ICD-10-CM | POA: Diagnosis not present

## 2017-09-13 DIAGNOSIS — Z79899 Other long term (current) drug therapy: Secondary | ICD-10-CM | POA: Insufficient documentation

## 2017-09-13 DIAGNOSIS — M47817 Spondylosis without myelopathy or radiculopathy, lumbosacral region: Secondary | ICD-10-CM | POA: Diagnosis not present

## 2017-09-13 DIAGNOSIS — G894 Chronic pain syndrome: Secondary | ICD-10-CM

## 2017-09-13 DIAGNOSIS — M47816 Spondylosis without myelopathy or radiculopathy, lumbar region: Secondary | ICD-10-CM

## 2017-09-13 DIAGNOSIS — Z5181 Encounter for therapeutic drug level monitoring: Secondary | ICD-10-CM

## 2017-09-13 DIAGNOSIS — M542 Cervicalgia: Secondary | ICD-10-CM | POA: Diagnosis not present

## 2017-09-13 DIAGNOSIS — F418 Other specified anxiety disorders: Secondary | ICD-10-CM | POA: Diagnosis not present

## 2017-09-13 DIAGNOSIS — M797 Fibromyalgia: Secondary | ICD-10-CM | POA: Diagnosis not present

## 2017-09-13 DIAGNOSIS — M546 Pain in thoracic spine: Secondary | ICD-10-CM | POA: Diagnosis not present

## 2017-09-13 DIAGNOSIS — R69 Illness, unspecified: Secondary | ICD-10-CM | POA: Diagnosis not present

## 2017-09-13 MED ORDER — HYDROCODONE-ACETAMINOPHEN 7.5-325 MG PO TABS: 1.0000 | ORAL_TABLET | Freq: Two times a day (BID) | ORAL | 0 refills | Status: DC | PRN

## 2017-09-13 NOTE — Progress Notes (Signed)
Subjective:    Patient ID: [name] [name], female    DOB: 1946/05/01, 72 y.o.   MRN: [id number]  HPI: [name] is a 72year old female who returns for follow up appointmentfor chronic pain and medication refill.She states her pain is located in her mid-back, left lower extremity,bilateral  popliteal fossa and left ankle. She rates her pain 6. Her current exercise regime is walking.   [name] reports at times she noticed bumps on her right arm, at this time no bumps are noted, she will follow up with her PCP.  [name] seen the Opthamologist ( Dr. Zigmund Daniel) ,she's waiting on her new glasses, still experiencing some Vertigo, she had to reschedule her neurology appointment she reports.   [name] Morphine equivalent is 15.00 MME.She is also prescribed Klonopin by her Psychiatrist Dr. Jessy Oto, he decreased her dose 0.5 mg as needed . We have reviewed the black box warning again regarding using opioids and benzodiazepines. I highlighted the dangers of using these drugs together and discussed the adverse events including respiratory suppression, overdose, cognitive impairment and importance of compliance with current regimen. She verbalizes understanding, we will continue to monitor and adjust as indicated.     [name] UDS from 08/16/2017  it was consistent.  Pain Inventory Average Pain 7 Pain Right Now 6 My pain is constant, burning, dull and aching  In the last 24 hours, has pain interfered with the following? General activity 5 Relation with others 6 Enjoyment of life 9 What TIME of day is your pain at its worst? Morning and night  Sleep (in general) Good  Pain is worse with: standing and some activites Pain improves with: rest and medication Relief from Meds: 8  Mobility use a cane how many minutes can you walk? 20 ability to climb steps?  yes do you drive?  yes Do you have any goals in this area?  yes  Function retired I need assistance with the following:   household duties and shopping Do you have any goals in this area?  yes  Neuro/Psych bladder control problems bowel control problems weakness tingling depression anxiety  Prior Studies Any changes since last visit?  no  Physicians involved in your care Any changes since last visit?  no   Family History  Problem Relation Age of Onset  . Cancer Unknown        breast -- grandmother  . Cancer Unknown        lung -- uncles (3)  . Leukemia Sister   . Diabetes Father   . Coronary artery disease Father   . Diabetes Mother    Social History   Socioeconomic History  . Marital status: Married    Spouse name: Not on file  . Number of children: Not on file  . Years of education: Not on file  . Highest education level: Not on file  Social Needs  . Financial resource strain: Not on file  . Food insecurity - worry: Not on file  . Food insecurity - inability: Not on file  . Transportation needs - medical: Not on file  . Transportation needs - non-medical: Not on file  Occupational History  . Not on file  Tobacco Use  . Smoking status: Never Smoker  . Smokeless tobacco: Never Used  Substance and Sexual Activity  . Alcohol use: No  . Drug use: No  . Sexual activity: Not on file  Other Topics Concern  . Not on file  Social  History Narrative  . Not on file   Past Surgical History:  Procedure Laterality Date  . CARDIAC CATHETERIZATION  01-17-2003  dr Darnell Level brodie   normal coronary arteries and LVF,  ef 60%  . EXCISION MORTON'S NEUROMA  2002   bilateral feet  . EXCISION OF BREAST BIOPSY Right 1990's   benign  . INTERSTIM IMPLANT PLACEMENT N/A 02/10/2015   Procedure: Barrie Lyme IMPLANT FIRST STAGE;  Surgeon: Bjorn Loser, MD;  Location: [facility];  Service: Urology;  Laterality: N/A;  . INTERSTIM IMPLANT PLACEMENT N/A 02/10/2015   Procedure: Barrie Lyme IMPLANT SECOND STAGE;  Surgeon: Bjorn Loser, MD;  Location: [facility];  Service:  Urology;  Laterality: N/A;  . LAPAROSCOPIC CHOLECYSTECTOMY  2000  . NEGATIVE SLEEP STUDY  2000   per pt  . ORIF ANKLE FRACTURE  01/10/2012   Procedure: OPEN REDUCTION INTERNAL FIXATION (ORIF) ANKLE FRACTURE;  Surgeon: Sanjuana Kava, MD;  Location: [facility];  Service: Orthopedics;  Laterality: Left;  . TOTAL THYROIDECTOMY  1985   benign tumor  . TRANSTHORACIC ECHOCARDIOGRAM  03-02-2009   normal echo,  ef 55-60%  . VAGINAL HYSTERECTOMY  1984  . WRIST SURGERY Left 2000   Past Medical History:  Diagnosis Date  . Anxiety   . Cervical radiculopathy   . Chronic back pain    R > L  . Chronic neck pain   . Depression   . Dysphagia    functional --  takes small bites  . Fibromyalgia   . GERD (gastroesophageal reflux disease)   . Hemorrhoids   . History of ankle fracture 01/08/2012   Status post open reduction internal fixation of a left ankle trimalleolar fracture by Dr. Iona Hansen on 01/10/2012.    Marland Kitchen History of benign thyroid tumor   . History of diabetes mellitus, type II    was diet controlled -- per pt pcp she was no longer diabetic  . History of hiatal hernia   . History of TIA (transient ischemic attack)    02-27-2009  no residual  . HTN (hypertension)   . Hyperlipidemia   . Hypothyroidism, postsurgical   . Lumbar radicular pain    R>L legs  . Migraine headache   . Mild intermittent asthma with allergic rhinitis without complication   . PONV (postoperative nausea and vomiting)   . Rotator cuff syndrome of right shoulder   . Urge urinary incontinence    refractory  . Wears glasses    There were no vitals taken for this visit.  Opioid Risk Score:  7 Fall Risk Score:  `1  Depression screen PHQ 2/9  Depression screen [facility] 2/9 04/14/2017 03/21/2017 01/19/2017 11/10/2016 10/17/2016 09/13/2016 12/22/2015  Decreased Interest 1 3 3 3 3 3  0  Down, Depressed, Hopeless 1 3 3 3 3 3  0  PHQ - 2 Score 2 6 6 6 6 6  0  Altered sleeping - - - - - - -  Tired, decreased energy - - - - - - -    Change in appetite - - - - - - -  Feeling bad or failure about yourself  - - - - - - -  Trouble concentrating - - - - - - -  Moving slowly or fidgety/restless - - - - - - -  Suicidal thoughts - - - - - - -  PHQ-9 Score - - - - - - -  Some recent data might be hidden     Review of Systems  Constitutional: Positive for appetite change, chills, diaphoresis, fever and unexpected weight change.  HENT: Negative.   Eyes: Negative.   Respiratory:       Respiratory infections  Cardiovascular: Positive for leg swelling.  Gastrointestinal: Positive for abdominal pain, constipation and nausea.       Bowel control problem   Endocrine:       High blood sugar   Genitourinary: Positive for difficulty urinating.       Bladder control  Musculoskeletal: Positive for arthralgias, back pain and gait problem.  Skin: Positive for rash.  Allergic/Immunologic: Negative.   Neurological: Positive for dizziness and weakness.       Tingling   Hematological: Negative.   Psychiatric/Behavioral: Positive for dysphoric mood. The patient is nervous/anxious.   All other systems reviewed and are negative.      Objective:   Physical Exam  Constitutional: She is oriented to person, place, and time. She appears well-developed and well-nourished.  HENT:  Head: Normocephalic and atraumatic.  Neck: Normal range of motion. Neck supple.  Cardiovascular: Normal rate and regular rhythm.  Pulmonary/Chest: Effort normal and breath sounds normal.  Musculoskeletal:  Normal Muscle Bulk and Muscle Testing Reveals: Upper Extremities: Full  ROM and Muscle Strength 5/5 Thoracic Paraspinal Tenderness: T-7-T-9 Lower Extremities: Full ROM and Muscle Strength 5/5 Arises from Table Slowly using straight cane for support Narrow Based Gait  Neurological: She is alert and oriented to person, place, and time.  Skin: Skin is warm and dry.  Psychiatric: She has a normal mood and affect.  Nursing note and vitals  reviewed.         Assessment & Plan:  1.History of fibromyalgia. Continue Current Medication Regime and Exercise Regime. Continue Gabapentin. 09/13/2017 2. Chronic midline thoracic back pain/low back pain/ Lumbosacral Spondylosis/ Lumbar Facet Arthropathy: Continue HEP as Tolerated. Continue to Monitor Refilled:  Hydrocodone 7.5/325mg  one tablet twice a day as needed for pain # 60 this month. 09/13/2017. We will continue the opioid monitoring program, this consists of regular clinic visits, examinations, urine drug screen, pill counts as well as use of [place] Controlled Substance Reporting System. Continue Neurontin. 3. Cervicalgia Cervical Radiculopathy: Continue Gabapentin:  09/13/2017 4. Anxiety and depression. Continue Effexor and Klonopin, Psychiatry Following,  09/13/2017 5. OA: Continue Voltaren Gel. 09/13/2017  20 minutes of face to face patient care time was spent during this visit. All questions were encouraged and answered.  F/U in 75month

## 2017-09-29 DIAGNOSIS — I10 Essential (primary) hypertension: Secondary | ICD-10-CM | POA: Diagnosis not present

## 2017-09-29 DIAGNOSIS — F5105 Insomnia due to other mental disorder: Secondary | ICD-10-CM | POA: Diagnosis not present

## 2017-09-29 DIAGNOSIS — F411 Generalized anxiety disorder: Secondary | ICD-10-CM | POA: Diagnosis not present

## 2017-09-29 DIAGNOSIS — R69 Illness, unspecified: Secondary | ICD-10-CM | POA: Diagnosis not present

## 2017-09-29 DIAGNOSIS — F4310 Post-traumatic stress disorder, unspecified: Secondary | ICD-10-CM | POA: Diagnosis not present

## 2017-10-06 ENCOUNTER — Other Ambulatory Visit: Payer: Self-pay | Admitting: Registered Nurse

## 2017-10-06 DIAGNOSIS — M546 Pain in thoracic spine: Secondary | ICD-10-CM

## 2017-10-06 DIAGNOSIS — M47816 Spondylosis without myelopathy or radiculopathy, lumbar region: Secondary | ICD-10-CM

## 2017-10-06 DIAGNOSIS — M75100 Unspecified rotator cuff tear or rupture of unspecified shoulder, not specified as traumatic: Secondary | ICD-10-CM

## 2017-10-06 DIAGNOSIS — M797 Fibromyalgia: Secondary | ICD-10-CM

## 2017-10-11 ENCOUNTER — Encounter: Payer: Self-pay | Admitting: Registered Nurse

## 2017-10-11 ENCOUNTER — Encounter: Payer: Medicare HMO | Attending: Physical Medicine and Rehabilitation | Admitting: Registered Nurse

## 2017-10-11 VITALS — BP 144/81 | HR 65 | Resp 14 | Ht 66.5 in | Wt 201.0 lb

## 2017-10-11 DIAGNOSIS — M47816 Spondylosis without myelopathy or radiculopathy, lumbar region: Secondary | ICD-10-CM

## 2017-10-11 DIAGNOSIS — G8929 Other chronic pain: Secondary | ICD-10-CM | POA: Diagnosis not present

## 2017-10-11 DIAGNOSIS — M25562 Pain in left knee: Secondary | ICD-10-CM

## 2017-10-11 DIAGNOSIS — M19011 Primary osteoarthritis, right shoulder: Secondary | ICD-10-CM | POA: Diagnosis not present

## 2017-10-11 DIAGNOSIS — R69 Illness, unspecified: Secondary | ICD-10-CM | POA: Diagnosis not present

## 2017-10-11 DIAGNOSIS — G894 Chronic pain syndrome: Secondary | ICD-10-CM | POA: Diagnosis not present

## 2017-10-11 DIAGNOSIS — M797 Fibromyalgia: Secondary | ICD-10-CM | POA: Diagnosis not present

## 2017-10-11 DIAGNOSIS — Z79899 Other long term (current) drug therapy: Secondary | ICD-10-CM

## 2017-10-11 DIAGNOSIS — Z5181 Encounter for therapeutic drug level monitoring: Secondary | ICD-10-CM

## 2017-10-11 DIAGNOSIS — M47817 Spondylosis without myelopathy or radiculopathy, lumbosacral region: Secondary | ICD-10-CM

## 2017-10-11 DIAGNOSIS — F418 Other specified anxiety disorders: Secondary | ICD-10-CM

## 2017-10-11 DIAGNOSIS — M25561 Pain in right knee: Secondary | ICD-10-CM | POA: Diagnosis not present

## 2017-10-11 MED ORDER — HYDROCODONE-ACETAMINOPHEN 7.5-325 MG PO TABS: 1.0000 | ORAL_TABLET | Freq: Two times a day (BID) | ORAL | 0 refills | Status: DC | PRN

## 2017-10-11 NOTE — Progress Notes (Signed)
Subjective:    Patient ID: [name] [name], female    DOB: 1946-03-16, 72 y.o.   MRN: [id number]  HPI: [name] [name] is a 72 year old female who returns for follow up appointment and medication refill. She states her pain is located in her right shoulder, lower back and bilateral knees. She rates her pain 7. Her current exercise regime is walking.   [name] is 16.00. She is also prescribed Klonopin by her psychiatrist Dr. Jessy Oto. We have reviewed the black box warning regarding using opioids and benzodiazepines. I highlighted the dangers of using these drugs together and discussed the adverse events including respiratory suppression, overdose, cognitive impairment and importance of compliance with current regimen. She verbalizes understanding, psychiatry following. We will continue to monitor.   Ms. Min last UDS was performed on 08/16/2017, it was consistent.    Pain Inventory Average Pain 6 Pain Right Now 7 My pain is constant and aching  In the last 24 hours, has pain interfered with the following? General activity 9 Relation with others 7 Enjoyment of life 7 What TIME of day is your pain at its worst? morning, evening, night Sleep (in general) Fair  Pain is worse with: bending, standing and some activites Pain improves with: rest, heat/ice and medication Relief from Meds: 7  Mobility walk with assistance use a cane how many minutes can you walk? 20 ability to climb steps?  yes do you drive?  yes Do you have any goals in this area?  yes  Function Do you have any goals in this area?  yes  Neuro/Psych bladder control problems bowel control problems weakness numbness trouble walking dizziness depression anxiety  Prior Studies Any changes since last visit?  no  Physicians involved in your care Any changes since last visit?  no   Family History  Problem Relation Age of Onset  . Cancer Unknown        breast -- grandmother  . Cancer  Unknown        lung -- uncles (3)  . Leukemia Sister   . Diabetes Father   . Coronary artery disease Father   . Diabetes Mother    Social History   Socioeconomic History  . Marital status: Married    Spouse name: Not on file  . Number of children: Not on file  . Years of education: Not on file  . Highest education level: Not on file  Occupational History  . Not on file  Social Needs  . Financial resource strain: Not on file  . Food insecurity:    Worry: Not on file    Inability: Not on file  . Transportation needs:    Medical: Not on file    Non-medical: Not on file  Tobacco Use  . Smoking status: Never Smoker  . Smokeless tobacco: Never Used  Substance and Sexual Activity  . Alcohol use: No  . Drug use: No  . Sexual activity: Not on file  Lifestyle  . Physical activity:    Days per week: Not on file    Minutes per session: Not on file  . Stress: Not on file  Relationships  . Social connections:    Talks on phone: Not on file    Gets together: Not on file    Attends religious service: Not on file    Active member of club or organization: Not on file    Attends meetings of clubs or organizations: Not on file  Relationship status: Not on file  Other Topics Concern  . Not on file  Social History Narrative  . Not on file   Past Surgical History:  Procedure Laterality Date  . CARDIAC CATHETERIZATION  01-17-2003  dr Darnell Level brodie   normal coronary arteries and LVF,  ef 60%  . EXCISION MORTON'S NEUROMA  2002   bilateral feet  . EXCISION OF BREAST BIOPSY Right 1990's   benign  . INTERSTIM IMPLANT PLACEMENT N/A 02/10/2015   Procedure: Barrie Lyme IMPLANT FIRST STAGE;  Surgeon: Bjorn Loser, MD;  Location: [facility];  Service: Urology;  Laterality: N/A;  . INTERSTIM IMPLANT PLACEMENT N/A 02/10/2015   Procedure: Barrie Lyme IMPLANT SECOND STAGE;  Surgeon: Bjorn Loser, MD;  Location: [facility];  Service: Urology;  Laterality: N/A;    . LAPAROSCOPIC CHOLECYSTECTOMY  2000  . NEGATIVE SLEEP STUDY  2000   per pt  . ORIF ANKLE FRACTURE  01/10/2012   Procedure: OPEN REDUCTION INTERNAL FIXATION (ORIF) ANKLE FRACTURE;  Surgeon: Sanjuana Kava, MD;  Location: [facility];  Service: Orthopedics;  Laterality: Left;  . TOTAL THYROIDECTOMY  1985   benign tumor  . TRANSTHORACIC ECHOCARDIOGRAM  03-02-2009   normal echo,  ef 55-60%  . VAGINAL HYSTERECTOMY  1984  . WRIST SURGERY Left 2000   Past Medical History:  Diagnosis Date  . Anxiety   . Cervical radiculopathy   . Chronic back pain    R > L  . Chronic neck pain   . Depression   . Dysphagia    functional --  takes small bites  . Fibromyalgia   . GERD (gastroesophageal reflux disease)   . Hemorrhoids   . History of ankle fracture 01/08/2012   Status post open reduction internal fixation of a left ankle trimalleolar fracture by Dr. Iona Hansen on 01/10/2012.    Marland Kitchen History of benign thyroid tumor   . History of diabetes mellitus, type II    was diet controlled -- per pt pcp she was no longer diabetic  . History of hiatal hernia   . History of TIA (transient ischemic attack)    02-27-2009  no residual  . HTN (hypertension)   . Hyperlipidemia   . Hypothyroidism, postsurgical   . Lumbar radicular pain    R>L legs  . Migraine headache   . Mild intermittent asthma with allergic rhinitis without complication   . PONV (postoperative nausea and vomiting)   . Rotator cuff syndrome of right shoulder   . Urge urinary incontinence    refractory  . Wears glasses    BP (!) 144/81 (BP Location: Left Arm, Patient Position: Sitting, Cuff Size: Normal)   Pulse 65   Resp 14   Ht 5' 6.5" (1.689 m)   Wt 201 lb (91.2 kg)   SpO2 98%   BMI 31.96 kg/m   Opioid Risk Score:   Fall Risk Score:  `1  Depression screen PHQ 2/9  Depression screen [facility] 2/9 04/14/2017 03/21/2017 01/19/2017 11/10/2016 10/17/2016 09/13/2016 12/22/2015  Decreased Interest 1 3 3 3 3 3  0  Down, Depressed, Hopeless 1 3 3  3 3 3  0  PHQ - 2 Score 2 6 6 6 6 6  0  Altered sleeping - - - - - - -  Tired, decreased energy - - - - - - -  Change in appetite - - - - - - -  Feeling bad or failure about yourself  - - - - - - -  Trouble concentrating - - - - - - -  Moving slowly or fidgety/restless - - - - - - -  Suicidal thoughts - - - - - - -  PHQ-9 Score - - - - - - -  Some recent data might be hidden    Review of Systems  Constitutional: Positive for appetite change, chills, diaphoresis, fever and unexpected weight change.  Respiratory: Positive for cough and wheezing.   Cardiovascular: Positive for leg swelling.  Gastrointestinal: Positive for abdominal pain.  Genitourinary: Positive for difficulty urinating.  Musculoskeletal: Positive for arthralgias, back pain, gait problem, myalgias, neck pain and neck stiffness.  Skin: Positive for rash.  Neurological: Positive for dizziness, weakness and numbness.  Psychiatric/Behavioral: Positive for dysphoric mood. The patient is nervous/anxious.        Objective:   Physical Exam  Constitutional: She is oriented to person, place, and time. She appears well-developed and well-nourished.  HENT:  Head: Normocephalic and atraumatic.  Neck: Normal range of motion. Neck supple.  Cardiovascular: Normal rate and regular rhythm.  Pulmonary/Chest: Effort normal and breath sounds normal.  Musculoskeletal:  Normal Muscle Bulk and Muscle Testing Reveals: Upper Extremities: Full ROM and Muscle Strength 5/5 Right AC Joint Tenderness Lumbar Paraspinal Tenderness: L-3-L-5 Lower Extremities: Full ROM and Muscle Strength 5/5 Arises from Table with ease Narrow Based Gait  Neurological: She is alert and oriented to person, place, and time.  Skin: Skin is warm and dry.  Psychiatric: She has a normal mood and affect.  Nursing note and vitals reviewed.         Assessment & Plan:   1.History of fibromyalgia. Continue current Treatment Regimen: HEP as Tolerated and Continue  Gabapentin. 10/10/2017 2. Chronic midline thoracic back pain/low back pain/ Lumbosacral Spondylosis/ Lumbar Facet Arthropathy: Continue HEP as Tolerated. Continue to Monitor Refilled: Continue current medication regimen:  Hydrocodone 7.5/325mg  one tablet twice a day as needed for pain # 60 this month. 10/10/2017. We will continue the opioid monitoring program, this consists of regular clinic visits, examinations, urine drug screen, pill counts as well as use of [place] Controlled Substance Reporting System. Continue Neurontin. 3. Cervicalgia Cervical Radiculopathy: No complaints today. Continue with Gabapentin:  10/10/2017 4. Anxiety and depression.Continue Effexor and  Klonopin Psychiatry Prescribing. 10/10/2017  20 minutes of face to face patient care time was spent during this visit. All questions were encouraged and answered.  F/U in 49month

## 2017-10-16 DIAGNOSIS — Z1231 Encounter for screening mammogram for malignant neoplasm of breast: Secondary | ICD-10-CM | POA: Diagnosis not present

## 2017-11-01 DIAGNOSIS — E039 Hypothyroidism, unspecified: Secondary | ICD-10-CM | POA: Diagnosis not present

## 2017-11-01 DIAGNOSIS — N9419 Other specified dyspareunia: Secondary | ICD-10-CM | POA: Diagnosis not present

## 2017-11-01 DIAGNOSIS — L309 Dermatitis, unspecified: Secondary | ICD-10-CM | POA: Diagnosis not present

## 2017-11-01 DIAGNOSIS — R7301 Impaired fasting glucose: Secondary | ICD-10-CM | POA: Diagnosis not present

## 2017-11-01 DIAGNOSIS — Z23 Encounter for immunization: Secondary | ICD-10-CM | POA: Diagnosis not present

## 2017-11-01 DIAGNOSIS — E782 Mixed hyperlipidemia: Secondary | ICD-10-CM | POA: Diagnosis not present

## 2017-11-01 DIAGNOSIS — I10 Essential (primary) hypertension: Secondary | ICD-10-CM | POA: Diagnosis not present

## 2017-11-03 ENCOUNTER — Telehealth: Payer: Self-pay | Admitting: *Deleted

## 2017-11-03 NOTE — Telephone Encounter (Signed)
Prior Authorization  Approved for gabapentin 300 mg capsules 120 per 30 days until 07/10/2018

## 2017-11-10 ENCOUNTER — Encounter: Payer: Self-pay | Admitting: Registered Nurse

## 2017-11-10 ENCOUNTER — Encounter: Payer: Medicare HMO | Attending: Physical Medicine and Rehabilitation | Admitting: Registered Nurse

## 2017-11-10 ENCOUNTER — Encounter: Payer: Medicare HMO | Admitting: Registered Nurse

## 2017-11-10 VITALS — BP 151/89 | HR 66 | Resp 14 | Ht 65.5 in | Wt 202.0 lb

## 2017-11-10 DIAGNOSIS — R69 Illness, unspecified: Secondary | ICD-10-CM | POA: Diagnosis not present

## 2017-11-10 DIAGNOSIS — Z5181 Encounter for therapeutic drug level monitoring: Secondary | ICD-10-CM | POA: Insufficient documentation

## 2017-11-10 DIAGNOSIS — G8929 Other chronic pain: Secondary | ICD-10-CM | POA: Diagnosis not present

## 2017-11-10 DIAGNOSIS — M47816 Spondylosis without myelopathy or radiculopathy, lumbar region: Secondary | ICD-10-CM

## 2017-11-10 DIAGNOSIS — G894 Chronic pain syndrome: Secondary | ICD-10-CM | POA: Diagnosis not present

## 2017-11-10 DIAGNOSIS — Z79899 Other long term (current) drug therapy: Secondary | ICD-10-CM | POA: Diagnosis not present

## 2017-11-10 DIAGNOSIS — F4310 Post-traumatic stress disorder, unspecified: Secondary | ICD-10-CM | POA: Diagnosis not present

## 2017-11-10 DIAGNOSIS — M546 Pain in thoracic spine: Secondary | ICD-10-CM

## 2017-11-10 DIAGNOSIS — M25562 Pain in left knee: Secondary | ICD-10-CM | POA: Diagnosis not present

## 2017-11-10 DIAGNOSIS — F411 Generalized anxiety disorder: Secondary | ICD-10-CM | POA: Diagnosis not present

## 2017-11-10 DIAGNOSIS — M47817 Spondylosis without myelopathy or radiculopathy, lumbosacral region: Secondary | ICD-10-CM

## 2017-11-10 DIAGNOSIS — M797 Fibromyalgia: Secondary | ICD-10-CM | POA: Diagnosis not present

## 2017-11-10 DIAGNOSIS — F5105 Insomnia due to other mental disorder: Secondary | ICD-10-CM | POA: Diagnosis not present

## 2017-11-10 DIAGNOSIS — M19011 Primary osteoarthritis, right shoulder: Secondary | ICD-10-CM | POA: Insufficient documentation

## 2017-11-10 DIAGNOSIS — F418 Other specified anxiety disorders: Secondary | ICD-10-CM

## 2017-11-10 DIAGNOSIS — I10 Essential (primary) hypertension: Secondary | ICD-10-CM | POA: Diagnosis not present

## 2017-11-10 MED ORDER — HYDROCODONE-ACETAMINOPHEN 7.5-325 MG PO TABS: 1.0000 | ORAL_TABLET | Freq: Two times a day (BID) | ORAL | 0 refills | Status: DC | PRN

## 2017-11-10 MED ORDER — VENLAFAXINE HCL 100 MG PO TABS: 100.0000 mg | ORAL_TABLET | Freq: Three times a day (TID) | ORAL | 3 refills | Status: DC

## 2017-11-10 MED ORDER — GABAPENTIN 300 MG PO CAPS: ORAL_CAPSULE | ORAL | 2 refills | Status: DC

## 2017-11-10 NOTE — Progress Notes (Signed)
Subjective:    Patient ID: [name] [name], female    DOB: Jan 11, 1946, 72 y.o.   MRN: [id number]  HPI: [name] [name] is a 72 year old female who returns for follow up appointment for chronic pain and medication refill. She states her pain is located in her neck, mid-lower back radiating into her left lower extremity and left knee.  She rates her pain 6. Her current exercise regime is walking and performing stretching exercises.   [name] [name] Morphine equivalent is 16.00 MME. She is also prescribed Clonazepam by Dr. Jessy Oto .We have discussed the black box warning of using opioids and benzodiazepines. I highlighted the dangers of using these drugs together and discussed the adverse events including respiratory suppression, overdose, cognitive impairment and importance of compliance with current regimen. We will continue to monitor and adjust as indicated. She is being closely monitored and under the care of her psychiatrist. Dr. Jessy Oto.  [name] [name] last UDS was Performed on 08/16/2017, it was consistent.   Pain Inventory Average Pain 9 Pain Right Now 6 My pain is constant, burning, dull and aching  In the last 24 hours, has pain interfered with the following? General activity 8 Relation with others 7 Enjoyment of life 6 What TIME of day is your pain at its worst? morning, daytime, night Sleep (in general) Fair  Pain is worse with: walking and inactivity Pain improves with: heat/ice, pacing activities and medication Relief from Meds: 6  Mobility walk with assistance use a cane Do you have any goals in this area?  yes  Function Do you have any goals in this area?  yes  Neuro/Psych bladder control problems bowel control problems weakness numbness tremor tingling trouble walking dizziness depression anxiety  Prior Studies Any changes since last visit?  no  Physicians involved in your care Any changes since last visit?  no   Family History  Problem Relation Age of Onset    . Cancer Unknown        breast -- grandmother  . Cancer Unknown        lung -- uncles (3)  . Leukemia Sister   . Diabetes Father   . Coronary artery disease Father   . Diabetes Mother    Social History   Socioeconomic History  . Marital status: Married    Spouse name: Not on file  . Number of children: Not on file  . Years of education: Not on file  . Highest education level: Not on file  Occupational History  . Not on file  Social Needs  . Financial resource strain: Not on file  . Food insecurity:    Worry: Not on file    Inability: Not on file  . Transportation needs:    Medical: Not on file    Non-medical: Not on file  Tobacco Use  . Smoking status: Never Smoker  . Smokeless tobacco: Never Used  Substance and Sexual Activity  . Alcohol use: No  . Drug use: No  . Sexual activity: Not on file  Lifestyle  . Physical activity:    Days per week: Not on file    Minutes per session: Not on file  . Stress: Not on file  Relationships  . Social connections:    Talks on phone: Not on file    Gets together: Not on file    Attends religious service: Not on file    Active member of club or organization: Not on file    Attends  meetings of clubs or organizations: Not on file    Relationship status: Not on file  Other Topics Concern  . Not on file  Social History Narrative  . Not on file   Past Surgical History:  Procedure Laterality Date  . CARDIAC CATHETERIZATION  01-17-2003  dr Darnell Level brodie   normal coronary arteries and LVF,  ef 60%  . EXCISION MORTON'S NEUROMA  2002   bilateral feet  . EXCISION OF BREAST BIOPSY Right 1990's   benign  . INTERSTIM IMPLANT PLACEMENT N/A 02/10/2015   Procedure: Barrie Lyme IMPLANT FIRST STAGE;  Surgeon: Bjorn Loser, MD;  Location: [facility];  Service: Urology;  Laterality: N/A;  . INTERSTIM IMPLANT PLACEMENT N/A 02/10/2015   Procedure: Barrie Lyme IMPLANT SECOND STAGE;  Surgeon: Bjorn Loser, MD;  Location: [facility];  Service: Urology;  Laterality: N/A;  . LAPAROSCOPIC CHOLECYSTECTOMY  2000  . NEGATIVE SLEEP STUDY  2000   per pt  . ORIF ANKLE FRACTURE  01/10/2012   Procedure: OPEN REDUCTION INTERNAL FIXATION (ORIF) ANKLE FRACTURE;  Surgeon: Sanjuana Kava, MD;  Location: [facility];  Service: Orthopedics;  Laterality: Left;  . TOTAL THYROIDECTOMY  1985   benign tumor  . TRANSTHORACIC ECHOCARDIOGRAM  03-02-2009   normal echo,  ef 55-60%  . VAGINAL HYSTERECTOMY  1984  . WRIST SURGERY Left 2000   Past Medical History:  Diagnosis Date  . Anxiety   . Cervical radiculopathy   . Chronic back pain    R > L  . Chronic neck pain   . Depression   . Dysphagia    functional --  takes small bites  . Fibromyalgia   . GERD (gastroesophageal reflux disease)   . Hemorrhoids   . History of ankle fracture 01/08/2012   Status post open reduction internal fixation of a left ankle trimalleolar fracture by Dr. Iona Hansen on 01/10/2012.    Marland Kitchen History of benign thyroid tumor   . History of diabetes mellitus, type II    was diet controlled -- per pt pcp she was no longer diabetic  . History of hiatal hernia   . History of TIA (transient ischemic attack)    02-27-2009  no residual  . HTN (hypertension)   . Hyperlipidemia   . Hypothyroidism, postsurgical   . Lumbar radicular pain    R>L legs  . Migraine headache   . Mild intermittent asthma with allergic rhinitis without complication   . PONV (postoperative nausea and vomiting)   . Rotator cuff syndrome of right shoulder   . Urge urinary incontinence    refractory  . Wears glasses    BP (!) 151/89 (BP Location: Left Arm, Patient Position: Sitting, Cuff Size: Normal)   Pulse 66   Resp 14   Ht 5' 5.5" (1.664 m)   Wt 202 lb (91.6 kg)   SpO2 97%   BMI 33.10 kg/m   Opioid Risk Score:   Fall Risk Score:  `1  Depression screen PHQ 2/9  Depression screen [facility] 2/9 04/14/2017 03/21/2017 01/19/2017 11/10/2016 10/17/2016 09/13/2016 12/22/2015  Decreased  Interest 1 3 3 3 3 3  0  Down, Depressed, Hopeless 1 3 3 3 3 3  0  PHQ - 2 Score 2 6 6 6 6 6  0  Altered sleeping - - - - - - -  Tired, decreased energy - - - - - - -  Change in appetite - - - - - - -  Feeling bad or failure about yourself  - - - - - - -  Trouble concentrating - - - - - - -  Moving slowly or fidgety/restless - - - - - - -  Suicidal thoughts - - - - - - -  PHQ-9 Score - - - - - - -  Some recent data might be hidden    Review of Systems  Constitutional: Positive for appetite change, diaphoresis and unexpected weight change.  HENT: Negative.   Eyes: Negative.   Respiratory: Negative.   Cardiovascular: Negative.   Gastrointestinal: Positive for abdominal pain, constipation, diarrhea and nausea.  Endocrine: Negative.        High blood sugar  Genitourinary: Negative.   Musculoskeletal: Positive for arthralgias, back pain, gait problem, myalgias, neck pain and neck stiffness.  Skin: Positive for rash.  Allergic/Immunologic: Negative.   Neurological: Positive for dizziness, tremors, weakness and numbness.       Tingling   Hematological: Negative.   Psychiatric/Behavioral: Positive for dysphoric mood. The patient is nervous/anxious.   All other systems reviewed and are negative.      Objective:   Physical Exam  Constitutional: She is oriented to person, place, and time. She appears well-developed and well-nourished.  HENT:  Head: Normocephalic and atraumatic.  Neck: Normal range of motion. Neck supple.  Cervical Paraspinal Tenderness: C-5-C-6  Cardiovascular: Normal rate and regular rhythm.  Pulmonary/Chest: Effort normal and breath sounds normal.  Musculoskeletal:  Normal Muscle Bulk and Muscle Testing Reveals: Upper Extremities: Full ROM and Muscle Strength 5/5 Thoracic Paraspinal Tenderness: T-3_T-6 Lumbar Paraspinal Tenderness: L-4-L-5 Lower Extremities Full ROM and Muscle Strength 5/5 Right Lower Extremity Flexion Produces Pain into Popliteal Fossa Left  Lower Extremity Flexion Produces Pain into Left Foot Arises from Table with ease Narrow Based Gait    Neurological: She is alert and oriented to person, place, and time.  Skin: Skin is warm and dry.  Psychiatric: She has a normal mood and affect.  Nursing note and vitals reviewed.         Assessment & Plan:  1.History of fibromyalgia. Continue current Medication Regimen: HEP as Tolerated and Continue Gabapentin. 11/10/2017 2. Chronicmidline thoracic back pain/low back pain/ Lumbosacral Spondylosis/ Lumbar Facet Arthropathy: Continue HEP as Tolerated. Continue to Monitor Refilled: Continue current medication regimen: Hydrocodone 7.5/325mg  one tablet twice a day as needed for pain # 60 this month. 11/10/2017. We will continue the opioid monitoring program, this consists of regular clinic visits, examinations, urine drug screen, pill counts as well as use of [place] Controlled Substance Reporting System. Continue Neurontin. 3.CervicalgiaCervical Radiculopathy: Continue with medication regimen with Gabapentin: 11/10/2017 4. Anxiety and depression.Continue Effexor and  Klonopin Psychiatry Prescribing. 11/10/2017  20 minutes of face to face patient care time was spent during this visit. All questions were encouraged and answered.  F/U in 13month

## 2017-11-13 DIAGNOSIS — L218 Other seborrheic dermatitis: Secondary | ICD-10-CM | POA: Diagnosis not present

## 2017-11-13 DIAGNOSIS — N760 Acute vaginitis: Secondary | ICD-10-CM | POA: Diagnosis not present

## 2017-11-13 DIAGNOSIS — L570 Actinic keratosis: Secondary | ICD-10-CM | POA: Diagnosis not present

## 2017-11-13 DIAGNOSIS — C44319 Basal cell carcinoma of skin of other parts of face: Secondary | ICD-10-CM | POA: Diagnosis not present

## 2017-11-13 DIAGNOSIS — N3946 Mixed incontinence: Secondary | ICD-10-CM | POA: Diagnosis not present

## 2017-11-13 DIAGNOSIS — L820 Inflamed seborrheic keratosis: Secondary | ICD-10-CM | POA: Diagnosis not present

## 2017-11-13 DIAGNOSIS — X32XXXD Exposure to sunlight, subsequent encounter: Secondary | ICD-10-CM | POA: Diagnosis not present

## 2017-11-13 DIAGNOSIS — Z1283 Encounter for screening for malignant neoplasm of skin: Secondary | ICD-10-CM | POA: Diagnosis not present

## 2017-11-15 DIAGNOSIS — F4310 Post-traumatic stress disorder, unspecified: Secondary | ICD-10-CM | POA: Diagnosis not present

## 2017-11-15 DIAGNOSIS — F411 Generalized anxiety disorder: Secondary | ICD-10-CM | POA: Diagnosis not present

## 2017-11-15 DIAGNOSIS — R69 Illness, unspecified: Secondary | ICD-10-CM | POA: Diagnosis not present

## 2017-12-05 DIAGNOSIS — N3946 Mixed incontinence: Secondary | ICD-10-CM | POA: Diagnosis not present

## 2017-12-05 DIAGNOSIS — R350 Frequency of micturition: Secondary | ICD-10-CM | POA: Diagnosis not present

## 2017-12-14 ENCOUNTER — Telehealth: Payer: Self-pay | Admitting: Registered Nurse

## 2017-12-14 DIAGNOSIS — M47816 Spondylosis without myelopathy or radiculopathy, lumbar region: Secondary | ICD-10-CM

## 2017-12-14 DIAGNOSIS — M797 Fibromyalgia: Secondary | ICD-10-CM

## 2017-12-14 MED ORDER — HYDROCODONE-ACETAMINOPHEN 7.5-325 MG PO TABS: 1.0000 | ORAL_TABLET | Freq: Two times a day (BID) | ORAL | 0 refills | Status: DC | PRN

## 2017-12-14 NOTE — Telephone Encounter (Signed)
Hydrocodone e-scribe to accommodate schedule appointment. According to PMP Aware Web-site last prescription picked up on 11/10/2017. Placed a call to Ms. Lissa Merlin regarding the above, no answer. Left message to return the call.

## 2017-12-21 DIAGNOSIS — I10 Essential (primary) hypertension: Secondary | ICD-10-CM | POA: Diagnosis not present

## 2017-12-21 DIAGNOSIS — I77810 Thoracic aortic ectasia: Secondary | ICD-10-CM | POA: Diagnosis not present

## 2017-12-21 DIAGNOSIS — E782 Mixed hyperlipidemia: Secondary | ICD-10-CM | POA: Diagnosis not present

## 2017-12-26 ENCOUNTER — Encounter: Payer: Self-pay | Admitting: Registered Nurse

## 2017-12-26 ENCOUNTER — Encounter: Payer: Medicare HMO | Attending: Physical Medicine and Rehabilitation | Admitting: Registered Nurse

## 2017-12-26 ENCOUNTER — Other Ambulatory Visit: Payer: Self-pay

## 2017-12-26 VITALS — BP 137/74 | HR 64 | Wt 201.8 lb

## 2017-12-26 DIAGNOSIS — M25562 Pain in left knee: Secondary | ICD-10-CM | POA: Diagnosis not present

## 2017-12-26 DIAGNOSIS — M47816 Spondylosis without myelopathy or radiculopathy, lumbar region: Secondary | ICD-10-CM

## 2017-12-26 DIAGNOSIS — R69 Illness, unspecified: Secondary | ICD-10-CM | POA: Diagnosis not present

## 2017-12-26 DIAGNOSIS — F418 Other specified anxiety disorders: Secondary | ICD-10-CM

## 2017-12-26 DIAGNOSIS — G894 Chronic pain syndrome: Secondary | ICD-10-CM | POA: Diagnosis not present

## 2017-12-26 DIAGNOSIS — M797 Fibromyalgia: Secondary | ICD-10-CM

## 2017-12-26 DIAGNOSIS — Z5181 Encounter for therapeutic drug level monitoring: Secondary | ICD-10-CM | POA: Diagnosis not present

## 2017-12-26 DIAGNOSIS — M19011 Primary osteoarthritis, right shoulder: Secondary | ICD-10-CM | POA: Insufficient documentation

## 2017-12-26 DIAGNOSIS — M546 Pain in thoracic spine: Secondary | ICD-10-CM | POA: Diagnosis not present

## 2017-12-26 DIAGNOSIS — M5416 Radiculopathy, lumbar region: Secondary | ICD-10-CM | POA: Diagnosis not present

## 2017-12-26 DIAGNOSIS — F4310 Post-traumatic stress disorder, unspecified: Secondary | ICD-10-CM | POA: Diagnosis not present

## 2017-12-26 DIAGNOSIS — Z79899 Other long term (current) drug therapy: Secondary | ICD-10-CM | POA: Diagnosis not present

## 2017-12-26 DIAGNOSIS — G8929 Other chronic pain: Secondary | ICD-10-CM | POA: Diagnosis not present

## 2017-12-26 DIAGNOSIS — M47817 Spondylosis without myelopathy or radiculopathy, lumbosacral region: Secondary | ICD-10-CM

## 2017-12-26 DIAGNOSIS — F411 Generalized anxiety disorder: Secondary | ICD-10-CM | POA: Diagnosis not present

## 2017-12-26 MED ORDER — HYDROCODONE-ACETAMINOPHEN 7.5-325 MG PO TABS: 1.0000 | ORAL_TABLET | Freq: Two times a day (BID) | ORAL | 0 refills | Status: DC | PRN

## 2017-12-26 NOTE — Progress Notes (Addendum)
Subjective:    Patient ID: [name] [name], female    DOB: 04/25/1946, 72 y.o.   MRN: [id number]  HPI: [name] [name] is a 72 year old female who returns for follow up appointment for chronic pain and medication refill. She states her pain is located in her mid-lower back radiating into her left lower extremity. She rates her pain 5. Her current exercise regime is walking.   [name] [name] Morphine Equivalent is 15.00 MME.She is also prescribed Clonazepam by Dr. Jessy Oto.We have discussed the black box warning of using opioids and benzodiazepines. I highlighted the dangers of using these drugs together and discussed the adverse events including respiratory suppression, overdose, cognitive impairment and importance of compliance with current regimen. We will continue to monitor and adjust as indicated. She is being closely monitored and under the care of her/him psychiatrist Dr. Jessy Oto.    Last UDS was performed on 08/16/2017, it was consistent.    Pain Inventory Average Pain 8 Pain Right Now 5 My pain is constant and aching  In the last 24 hours, has pain interfered with the following? General activity 6 Relation with others 4 Enjoyment of life 4 What TIME of day is your pain at its worst? daytime and night Sleep (in general) Fair  Pain is worse with: bending, standing and some activites Pain improves with: rest, heat/ice, medication and injections Relief from Meds: 5  Mobility use a cane use a walker how many minutes can you walk? 20 ability to climb steps?  yes  Function Do you have any goals in this area?  yes  Neuro/Psych tremor depression anxiety  Prior Studies Any changes since last visit?  no  Physicians involved in your care Any changes since last visit?  no   Family History  Problem Relation Age of Onset  . Cancer Unknown        breast -- grandmother  . Cancer Unknown        lung -- uncles (3)  . Leukemia Sister   . Diabetes Father   . Coronary artery disease  Father   . Diabetes Mother    Social History   Socioeconomic History  . Marital status: Married    Spouse name: Not on file  . Number of children: Not on file  . Years of education: Not on file  . Highest education level: Not on file  Occupational History  . Not on file  Social Needs  . Financial resource strain: Not on file  . Food insecurity:    Worry: Not on file    Inability: Not on file  . Transportation needs:    Medical: Not on file    Non-medical: Not on file  Tobacco Use  . Smoking status: Never Smoker  . Smokeless tobacco: Never Used  Substance and Sexual Activity  . Alcohol use: No  . Drug use: No  . Sexual activity: Not on file  Lifestyle  . Physical activity:    Days per week: Not on file    Minutes per session: Not on file  . Stress: Not on file  Relationships  . Social connections:    Talks on phone: Not on file    Gets together: Not on file    Attends religious service: Not on file    Active member of club or organization: Not on file    Attends meetings of clubs or organizations: Not on file    Relationship status: Not on file  Other Topics Concern  .  Not on file  Social History Narrative  . Not on file   Past Surgical History:  Procedure Laterality Date  . CARDIAC CATHETERIZATION  01-17-2003  dr Darnell Level brodie   normal coronary arteries and LVF,  ef 60%  . EXCISION MORTON'S NEUROMA  2002   bilateral feet  . EXCISION OF BREAST BIOPSY Right 1990's   benign  . INTERSTIM IMPLANT PLACEMENT N/A 02/10/2015   Procedure: [name] [name] IMPLANT FIRST STAGE;  Surgeon: Bjorn Loser, MD;  Location: [facility];  Service: Urology;  Laterality: N/A;  . INTERSTIM IMPLANT PLACEMENT N/A 02/10/2015   Procedure: [name] [name] IMPLANT SECOND STAGE;  Surgeon: Bjorn Loser, MD;  Location: [facility];  Service: Urology;  Laterality: N/A;  . LAPAROSCOPIC CHOLECYSTECTOMY  2000  . NEGATIVE SLEEP STUDY  2000   per pt  . ORIF ANKLE FRACTURE   01/10/2012   Procedure: OPEN REDUCTION INTERNAL FIXATION (ORIF) ANKLE FRACTURE;  Surgeon: Sanjuana Kava, MD;  Location: [facility];  Service: Orthopedics;  Laterality: Left;  . TOTAL THYROIDECTOMY  1985   benign tumor  . TRANSTHORACIC ECHOCARDIOGRAM  03-02-2009   normal echo,  ef 55-60%  . VAGINAL HYSTERECTOMY  1984  . WRIST SURGERY Left 2000   Past Medical History:  Diagnosis Date  . Anxiety   . Cervical radiculopathy   . Chronic back pain    R > L  . Chronic neck pain   . Depression   . Dysphagia    functional --  takes small bites  . Fibromyalgia   . GERD (gastroesophageal reflux disease)   . Hemorrhoids   . History of ankle fracture 01/08/2012   Status post open reduction internal fixation of a left ankle trimalleolar fracture by Dr. Iona Hansen on 01/10/2012.    Marland Kitchen History of benign thyroid tumor   . History of diabetes mellitus, type II    was diet controlled -- per pt pcp she was no longer diabetic  . History of hiatal hernia   . History of TIA (transient ischemic attack)    02-27-2009  no residual  . HTN (hypertension)   . Hyperlipidemia   . Hypothyroidism, postsurgical   . Lumbar radicular pain    R>L legs  . Migraine headache   . Mild intermittent asthma with allergic rhinitis without complication   . PONV (postoperative nausea and vomiting)   . Rotator cuff syndrome of right shoulder   . Urge urinary incontinence    refractory  . Wears glasses    BP 137/74   Pulse 64   Wt 201 lb 12.8 oz (91.5 kg)   SpO2 96%   BMI 33.07 kg/m   Opioid Risk Score:   Fall Risk Score:  `1  Depression screen PHQ 2/9  Depression screen [facility] 2/9 12/26/2017 04/14/2017 03/21/2017 01/19/2017 11/10/2016 10/17/2016 09/13/2016  Decreased Interest 1 1 3 3 3 3 3   Down, Depressed, Hopeless 1 1 3 3 3 3 3   PHQ - 2 Score 2 2 6 6 6 6 6   Altered sleeping - - - - - - -  Tired, decreased energy - - - - - - -  Change in appetite - - - - - - -  Feeling bad or failure about yourself  - - - - - - -    Trouble concentrating - - - - - - -  Moving slowly or fidgety/restless - - - - - - -  Suicidal thoughts - - - - - - -  PHQ-9  Score - - - - - - -  Some recent data might be hidden   Review of Systems  Constitutional: Negative.   HENT: Negative.   Eyes: Negative.   Respiratory: Negative.   Cardiovascular: Negative.   Gastrointestinal: Negative.   Endocrine: Negative.   Genitourinary: Negative.   Musculoskeletal: Negative.   Skin: Negative.   Allergic/Immunologic: Negative.   Neurological: Negative.   Hematological: Negative.   Psychiatric/Behavioral: Negative.   All other systems reviewed and are negative.      Objective:   Physical Exam  Constitutional: She is oriented to person, place, and time. She appears well-developed and well-nourished.  HENT:  Head: Normocephalic and atraumatic.  Neck: Normal range of motion. Neck supple.  Cardiovascular: Normal rate and regular rhythm.  Pulmonary/Chest: Effort normal and breath sounds normal.  Musculoskeletal:  Normal Muscle Bulk and Muscle Testing Reveals: Upper Extremities: Full ROM and Muscle Strength 5/5 Thoracic Paraspinal Tenderness: T-1-T-3 Thoracic Paraspinal Tenderness: T-7-T-9 Lumbar Paraspinal Tenderness: L-3-L-5 Lower Extremities: Full ROM and Muscle Strength 5/5 Arises from table with ease Narrow Based Gait  Neurological: She is alert and oriented to person, place, and time.  Skin: Skin is warm and dry.  Psychiatric: She has a normal mood and affect.  Nursing note and vitals reviewed.         Assessment & Plan:  1.History of fibromyalgia.Continue current Medication Regimen: HEP as Tolerated and Continue Gabapentin. 12/26/2017 2. Chronicmidline thoracic back pain/low back pain/ Lumbosacral Spondylosis/ Lumbar Facet Arthropathy: Continue HEP as Tolerated. Continue to Monitor Refilled:Continue current medication regimen:Hydrocodone 7.5/325mg  one tablet twice a day as needed for pain # 60 this month.  12/26/2017. We will continue the opioid monitoring program, this consists of regular clinic visits, examinations, urine drug screen, pill counts as well as use of [place] Controlled Substance Reporting System. Continue Neurontin. 3.CervicalgiaCervical Radiculopathy:Continue with medication regimen withGabapentin: 12/26/2017 4. Anxiety and depression.Continue Effexor andKlonopin Psychiatry Prescribing.12/26/2017  20 minutes of face to face patient care time was spent during this visit. All questions were encouraged and answered.  F/U in 86month

## 2018-01-10 DIAGNOSIS — R69 Illness, unspecified: Secondary | ICD-10-CM | POA: Diagnosis not present

## 2018-01-10 DIAGNOSIS — I10 Essential (primary) hypertension: Secondary | ICD-10-CM | POA: Diagnosis not present

## 2018-01-10 DIAGNOSIS — F411 Generalized anxiety disorder: Secondary | ICD-10-CM | POA: Diagnosis not present

## 2018-01-10 DIAGNOSIS — F5105 Insomnia due to other mental disorder: Secondary | ICD-10-CM | POA: Diagnosis not present

## 2018-01-10 DIAGNOSIS — F4310 Post-traumatic stress disorder, unspecified: Secondary | ICD-10-CM | POA: Diagnosis not present

## 2018-01-16 DIAGNOSIS — F411 Generalized anxiety disorder: Secondary | ICD-10-CM | POA: Diagnosis not present

## 2018-01-16 DIAGNOSIS — F4310 Post-traumatic stress disorder, unspecified: Secondary | ICD-10-CM | POA: Diagnosis not present

## 2018-01-16 DIAGNOSIS — R69 Illness, unspecified: Secondary | ICD-10-CM | POA: Diagnosis not present

## 2018-01-19 DIAGNOSIS — N3946 Mixed incontinence: Secondary | ICD-10-CM | POA: Diagnosis not present

## 2018-01-23 DIAGNOSIS — Z23 Encounter for immunization: Secondary | ICD-10-CM | POA: Diagnosis not present

## 2018-01-29 ENCOUNTER — Encounter: Payer: Medicare HMO | Admitting: Registered Nurse

## 2018-02-07 DIAGNOSIS — F411 Generalized anxiety disorder: Secondary | ICD-10-CM | POA: Diagnosis not present

## 2018-02-07 DIAGNOSIS — R69 Illness, unspecified: Secondary | ICD-10-CM | POA: Diagnosis not present

## 2018-02-07 DIAGNOSIS — F4310 Post-traumatic stress disorder, unspecified: Secondary | ICD-10-CM | POA: Diagnosis not present

## 2018-02-15 ENCOUNTER — Encounter: Payer: Medicare HMO | Attending: Physical Medicine and Rehabilitation | Admitting: Registered Nurse

## 2018-02-15 ENCOUNTER — Encounter: Payer: Self-pay | Admitting: Registered Nurse

## 2018-02-15 VITALS — BP 148/85 | HR 62 | Resp 14 | Ht 67.0 in | Wt 204.0 lb

## 2018-02-15 DIAGNOSIS — M546 Pain in thoracic spine: Secondary | ICD-10-CM

## 2018-02-15 DIAGNOSIS — M25531 Pain in right wrist: Secondary | ICD-10-CM

## 2018-02-15 DIAGNOSIS — Z5181 Encounter for therapeutic drug level monitoring: Secondary | ICD-10-CM

## 2018-02-15 DIAGNOSIS — M542 Cervicalgia: Secondary | ICD-10-CM | POA: Diagnosis not present

## 2018-02-15 DIAGNOSIS — M19011 Primary osteoarthritis, right shoulder: Secondary | ICD-10-CM | POA: Insufficient documentation

## 2018-02-15 DIAGNOSIS — M7062 Trochanteric bursitis, left hip: Secondary | ICD-10-CM

## 2018-02-15 DIAGNOSIS — Z79899 Other long term (current) drug therapy: Secondary | ICD-10-CM

## 2018-02-15 DIAGNOSIS — M25532 Pain in left wrist: Secondary | ICD-10-CM

## 2018-02-15 DIAGNOSIS — G894 Chronic pain syndrome: Secondary | ICD-10-CM

## 2018-02-15 DIAGNOSIS — R69 Illness, unspecified: Secondary | ICD-10-CM | POA: Diagnosis not present

## 2018-02-15 DIAGNOSIS — M5416 Radiculopathy, lumbar region: Secondary | ICD-10-CM | POA: Diagnosis not present

## 2018-02-15 DIAGNOSIS — F418 Other specified anxiety disorders: Secondary | ICD-10-CM

## 2018-02-15 DIAGNOSIS — M47816 Spondylosis without myelopathy or radiculopathy, lumbar region: Secondary | ICD-10-CM

## 2018-02-15 DIAGNOSIS — M797 Fibromyalgia: Secondary | ICD-10-CM

## 2018-02-15 DIAGNOSIS — M47817 Spondylosis without myelopathy or radiculopathy, lumbosacral region: Secondary | ICD-10-CM

## 2018-02-15 DIAGNOSIS — G8929 Other chronic pain: Secondary | ICD-10-CM

## 2018-02-15 MED ORDER — HYDROCODONE-ACETAMINOPHEN 7.5-325 MG PO TABS: 1.0000 | ORAL_TABLET | Freq: Two times a day (BID) | ORAL | 0 refills | Status: DC | PRN

## 2018-02-15 NOTE — Progress Notes (Signed)
Subjective:    Patient ID: [name] [name], female    DOB: 1946/03/06, 72 y.o.   MRN: [id number]  HPI: Ms. MAEVIS [name] is a 72 year old female who returns for follow up appointment for chronic pain and medication refill. She states her pain is located in her neck and mid- lower back pain. Also reports her bilateral hands broke out in a rash, dermatitis noted. She has a scheduled appointment with dermatology she reports.   Ms. Lesesne Morphine Equivalent is 15.00 MME. Last UDS was Performed on 08/16/2017, it was consistent.    Pain Inventory Average Pain 7 Pain Right Now 5 My pain is burning, dull and aching  In the last 24 hours, has pain interfered with the following? General activity 9 Relation with others 5 Enjoyment of life 2 What TIME of day is your pain at its worst? daytime, night Sleep (in general) Fair  Pain is worse with: inactivity, standing and some activites Pain improves with: rest, heat/ice, medication and injections Relief from Meds: 6  Mobility walk without assistance  Function retired  Neuro/Psych No problems in this area  Prior Studies Any changes since last visit?  no  Physicians involved in your care Any changes since last visit?  no   Family History  Problem Relation Age of Onset  . Cancer Unknown        breast -- grandmother  . Cancer Unknown        lung -- uncles (3)  . Leukemia Sister   . Diabetes Father   . Coronary artery disease Father   . Diabetes Mother    Social History   Socioeconomic History  . Marital status: Married    Spouse name: Not on file  . Number of children: Not on file  . Years of education: Not on file  . Highest education level: Not on file  Occupational History  . Not on file  Social Needs  . Financial resource strain: Not on file  . Food insecurity:    Worry: Not on file    Inability: Not on file  . Transportation needs:    Medical: Not on file    Non-medical: Not on file  Tobacco Use  . Smoking  status: Never Smoker  . Smokeless tobacco: Never Used  Substance and Sexual Activity  . Alcohol use: No  . Drug use: No  . Sexual activity: Not on file  Lifestyle  . Physical activity:    Days per week: Not on file    Minutes per session: Not on file  . Stress: Not on file  Relationships  . Social connections:    Talks on phone: Not on file    Gets together: Not on file    Attends religious service: Not on file    Active member of club or organization: Not on file    Attends meetings of clubs or organizations: Not on file    Relationship status: Not on file  Other Topics Concern  . Not on file  Social History Narrative  . Not on file   Past Surgical History:  Procedure Laterality Date  . CARDIAC CATHETERIZATION  01-17-2003  dr Darnell Level brodie   normal coronary arteries and LVF,  ef 60%  . EXCISION MORTON'S NEUROMA  2002   bilateral feet  . EXCISION OF BREAST BIOPSY Right 1990's   benign  . INTERSTIM IMPLANT PLACEMENT N/A 02/10/2015   Procedure: Barrie Lyme IMPLANT FIRST STAGE;  Surgeon: Bjorn Loser, MD;  Location:  [place];  Service: Urology;  Laterality: N/A;  . INTERSTIM IMPLANT PLACEMENT N/A 02/10/2015   Procedure: Barrie Lyme IMPLANT SECOND STAGE;  Surgeon: Bjorn Loser, MD;  Location: [facility];  Service: Urology;  Laterality: N/A;  . LAPAROSCOPIC CHOLECYSTECTOMY  2000  . NEGATIVE SLEEP STUDY  2000   per pt  . ORIF ANKLE FRACTURE  01/10/2012   Procedure: OPEN REDUCTION INTERNAL FIXATION (ORIF) ANKLE FRACTURE;  Surgeon: Sanjuana Kava, MD;  Location: [facility];  Service: Orthopedics;  Laterality: Left;  . TOTAL THYROIDECTOMY  1985   benign tumor  . TRANSTHORACIC ECHOCARDIOGRAM  03-02-2009   normal echo,  ef 55-60%  . VAGINAL HYSTERECTOMY  1984  . WRIST SURGERY Left 2000   Past Medical History:  Diagnosis Date  . Anxiety   . Cervical radiculopathy   . Chronic back pain    R > L  . Chronic neck pain   . Depression   . Dysphagia     functional --  takes small bites  . Fibromyalgia   . GERD (gastroesophageal reflux disease)   . Hemorrhoids   . History of ankle fracture 01/08/2012   Status post open reduction internal fixation of a left ankle trimalleolar fracture by Dr. Iona Hansen on 01/10/2012.    Marland Kitchen History of benign thyroid tumor   . History of diabetes mellitus, type II    was diet controlled -- per pt pcp she was no longer diabetic  . History of hiatal hernia   . History of TIA (transient ischemic attack)    02-27-2009  no residual  . HTN (hypertension)   . Hyperlipidemia   . Hypothyroidism, postsurgical   . Lumbar radicular pain    R>L legs  . Migraine headache   . Mild intermittent asthma with allergic rhinitis without complication   . PONV (postoperative nausea and vomiting)   . Rotator cuff syndrome of right shoulder   . Urge urinary incontinence    refractory  . Wears glasses    BP (!) 148/85 (BP Location: Left Arm, Patient Position: Sitting, Cuff Size: Large)   Pulse 62   Resp 14   Ht 5\' 7"  (1.702 m)   Wt 204 lb (92.5 kg)   SpO2 98%   BMI 31.95 kg/m   Opioid Risk Score:   Fall Risk Score:  `1  Depression screen PHQ 2/9  Depression screen [facility] 2/9 12/26/2017 04/14/2017 03/21/2017 01/19/2017 11/10/2016 10/17/2016 09/13/2016  Decreased Interest 1 1 3 3 3 3 3   Down, Depressed, Hopeless 1 1 3 3 3 3 3   PHQ - 2 Score 2 2 6 6 6 6 6   Altered sleeping - - - - - - -  Tired, decreased energy - - - - - - -  Change in appetite - - - - - - -  Feeling bad or failure about yourself  - - - - - - -  Trouble concentrating - - - - - - -  Moving slowly or fidgety/restless - - - - - - -  Suicidal thoughts - - - - - - -  PHQ-9 Score - - - - - - -  Some recent data might be hidden    Review of Systems  Constitutional: Negative.   HENT: Negative.   Eyes: Negative.   Respiratory: Negative.   Cardiovascular: Negative.   Gastrointestinal: Negative.   Endocrine: Negative.   Genitourinary: Negative.     Musculoskeletal: Positive for arthralgias, back pain, neck pain and neck  stiffness.  Skin: Negative.   Allergic/Immunologic: Negative.   Neurological: Negative.   Hematological: Negative.   Psychiatric/Behavioral: Negative.   All other systems reviewed and are negative.      Objective:   Physical Exam  Constitutional: She is oriented to person, place, and time. She appears well-developed and well-nourished.  HENT:  Head: Normocephalic and atraumatic.  Neck: Normal range of motion. Neck supple.  Cervical Paraspinal Tenderness: C-5- C-6  Cardiovascular: Normal rate and regular rhythm.  Pulmonary/Chest: Effort normal and breath sounds normal.  Musculoskeletal:  Normal Muscle Bulk and Muscle Testing Reveals: Upper Extremities: Full ROM and Muscle Strength 5/5 Thoracic Paraspinal Tenderness: T-3-T-7 Lower Extremities: Full ROM and Muscle Strength 5/5 Arises from Table with ease Narrow Based gait  Neurological: She is alert and oriented to person, place, and time.  Skin: Skin is warm and dry.  Psychiatric: She has a normal mood and affect. Her behavior is normal.  Nursing note and vitals reviewed.         Assessment & Plan:  1.History of fibromyalgia.Continue currentMedicationRegimen: HEP as Tolerated and Continue Gabapentin. 02/16/2018 2. Chronicmidline thoracic back pain/low back pain/ Lumbosacral Spondylosis/ Lumbar Facet Arthropathy: Continue HEP as Tolerated. Continue to Monitor Refilled:Continue current medication regimen:Hydrocodone 7.5/325mg  one tablet twice a day as needed for pain # 60. 02/16/2018. We will continue the opioid monitoring program, this consists of regular clinic visits, examinations, urine drug screen, pill counts as well as use of [place] Controlled Substance Reporting System. Continue Neurontin. 3.CervicalgiaCervical Radiculopathy:Continue withmedication regimen withGabapentin: 02/16/2018 4. Anxiety and depression.Continue  Effexor andKlonopin Psychiatry Prescribing.02/16/2018  20 minutes of face to face patient care time was spent during this visit. All questions were encouraged and answered.  F/U in 31month

## 2018-02-20 DIAGNOSIS — L820 Inflamed seborrheic keratosis: Secondary | ICD-10-CM | POA: Diagnosis not present

## 2018-02-20 DIAGNOSIS — L308 Other specified dermatitis: Secondary | ICD-10-CM | POA: Diagnosis not present

## 2018-02-22 DIAGNOSIS — F411 Generalized anxiety disorder: Secondary | ICD-10-CM | POA: Diagnosis not present

## 2018-02-22 DIAGNOSIS — F4310 Post-traumatic stress disorder, unspecified: Secondary | ICD-10-CM | POA: Diagnosis not present

## 2018-02-22 DIAGNOSIS — R69 Illness, unspecified: Secondary | ICD-10-CM | POA: Diagnosis not present

## 2018-03-07 DIAGNOSIS — T85191A Other mechanical complication of implanted electronic neurostimulator (electrode) of peripheral nerve, initial encounter: Secondary | ICD-10-CM | POA: Diagnosis not present

## 2018-03-07 DIAGNOSIS — T85193A Other mechanical complication of implanted electronic neurostimulator, generator, initial encounter: Secondary | ICD-10-CM | POA: Diagnosis not present

## 2018-03-07 DIAGNOSIS — N3941 Urge incontinence: Secondary | ICD-10-CM | POA: Diagnosis not present

## 2018-03-15 DIAGNOSIS — F5105 Insomnia due to other mental disorder: Secondary | ICD-10-CM | POA: Diagnosis not present

## 2018-03-15 DIAGNOSIS — F411 Generalized anxiety disorder: Secondary | ICD-10-CM | POA: Diagnosis not present

## 2018-03-15 DIAGNOSIS — R69 Illness, unspecified: Secondary | ICD-10-CM | POA: Diagnosis not present

## 2018-03-15 DIAGNOSIS — I10 Essential (primary) hypertension: Secondary | ICD-10-CM | POA: Diagnosis not present

## 2018-03-15 DIAGNOSIS — F4310 Post-traumatic stress disorder, unspecified: Secondary | ICD-10-CM | POA: Diagnosis not present

## 2018-03-19 ENCOUNTER — Encounter: Payer: Self-pay | Admitting: Physical Medicine & Rehabilitation

## 2018-03-19 ENCOUNTER — Encounter: Payer: Medicare HMO | Attending: Physical Medicine and Rehabilitation | Admitting: Physical Medicine & Rehabilitation

## 2018-03-19 ENCOUNTER — Other Ambulatory Visit: Payer: Self-pay

## 2018-03-19 VITALS — BP 134/85 | HR 73 | Ht 66.5 in | Wt 203.8 lb

## 2018-03-19 DIAGNOSIS — M797 Fibromyalgia: Secondary | ICD-10-CM

## 2018-03-19 DIAGNOSIS — Z79891 Long term (current) use of opiate analgesic: Secondary | ICD-10-CM | POA: Diagnosis not present

## 2018-03-19 DIAGNOSIS — G894 Chronic pain syndrome: Secondary | ICD-10-CM

## 2018-03-19 DIAGNOSIS — M47816 Spondylosis without myelopathy or radiculopathy, lumbar region: Secondary | ICD-10-CM | POA: Diagnosis not present

## 2018-03-19 DIAGNOSIS — M19011 Primary osteoarthritis, right shoulder: Secondary | ICD-10-CM | POA: Diagnosis not present

## 2018-03-19 DIAGNOSIS — Z5181 Encounter for therapeutic drug level monitoring: Secondary | ICD-10-CM

## 2018-03-19 DIAGNOSIS — Z79899 Other long term (current) drug therapy: Secondary | ICD-10-CM | POA: Insufficient documentation

## 2018-03-19 MED ORDER — HYDROCODONE-ACETAMINOPHEN 7.5-325 MG PO TABS: 1.0000 | ORAL_TABLET | Freq: Two times a day (BID) | ORAL | 0 refills | Status: DC | PRN

## 2018-03-19 NOTE — Progress Notes (Signed)
Subjective:    Patient ID: [name] [name], female    DOB: 11/23/1945, 72 y.o.   MRN: [id number]  HPI   [name] [name] is here in follow up of her chronic pain. She complains of increased leg pain at night. She also complains of axial pain in the cervical-thoracic areas as well as radiation to her shoulders. She has also had cramps in her feet at night. Neck pain is worse if she's  Been up standing for a while.   She remains on hydrocodone for pain relief. She was given dilaudid for recent stimlator replacement in her back.   Her mood has been better on lamictal. She says that psychiatry is looking to take her off klonopin. Her sleep has improved despite pain symptoms at night   Pain Inventory Average Pain 8 Pain Right Now 5 My pain is dull and aching  In the last 24 hours, has pain interfered with the following? General activity 9 Relation with others 4 Enjoyment of life 6 What TIME of day is your pain at its worst? daytime Sleep (in general) Good  Pain is worse with: walking, bending, standing and some activites Pain improves with: rest, heat/ice, medication and injections Relief from Meds: 7  Mobility walk with assistance use a cane ability to climb steps?  yes do you drive?  no  Function Do you have any goals in this area?  yes  Neuro/Psych bladder control problems weakness numbness tremor spasms depression anxiety  Prior Studies Any changes since last visit?  no  Physicians involved in your care Any changes since last visit?  no   Family History  Problem Relation Age of Onset  . Cancer Unknown        breast -- grandmother  . Cancer Unknown        lung -- uncles (3)  . Leukemia Sister   . Diabetes Father   . Coronary artery disease Father   . Diabetes Mother    Social History   Socioeconomic History  . Marital status: Married    Spouse name: Not on file  . Number of children: Not on file  . Years of education: Not on file  . Highest education level:  Not on file  Occupational History  . Not on file  Social Needs  . Financial resource strain: Not on file  . Food insecurity:    Worry: Not on file    Inability: Not on file  . Transportation needs:    Medical: Not on file    Non-medical: Not on file  Tobacco Use  . Smoking status: Never Smoker  . Smokeless tobacco: Never Used  Substance and Sexual Activity  . Alcohol use: No  . Drug use: No  . Sexual activity: Not on file  Lifestyle  . Physical activity:    Days per week: Not on file    Minutes per session: Not on file  . Stress: Not on file  Relationships  . Social connections:    Talks on phone: Not on file    Gets together: Not on file    Attends religious service: Not on file    Active member of club or organization: Not on file    Attends meetings of clubs or organizations: Not on file    Relationship status: Not on file  Other Topics Concern  . Not on file  Social History Narrative  . Not on file   Past Surgical History:  Procedure Laterality Date  . CARDIAC CATHETERIZATION  01-17-2003  dr Darnell Level brodie   normal coronary arteries and LVF,  ef 60%  . EXCISION MORTON'S NEUROMA  2002   bilateral feet  . EXCISION OF BREAST BIOPSY Right 1990's   benign  . INTERSTIM IMPLANT PLACEMENT N/A 02/10/2015   Procedure: Barrie Lyme IMPLANT FIRST STAGE;  Surgeon: Bjorn Loser, MD;  Location: [facility];  Service: Urology;  Laterality: N/A;  . INTERSTIM IMPLANT PLACEMENT N/A 02/10/2015   Procedure: Barrie Lyme IMPLANT SECOND STAGE;  Surgeon: Bjorn Loser, MD;  Location: [facility];  Service: Urology;  Laterality: N/A;  . LAPAROSCOPIC CHOLECYSTECTOMY  2000  . NEGATIVE SLEEP STUDY  2000   per pt  . ORIF ANKLE FRACTURE  01/10/2012   Procedure: OPEN REDUCTION INTERNAL FIXATION (ORIF) ANKLE FRACTURE;  Surgeon: Sanjuana Kava, MD;  Location: [facility];  Service: Orthopedics;  Laterality: Left;  . TOTAL THYROIDECTOMY  1985   benign tumor  . TRANSTHORACIC  ECHOCARDIOGRAM  03-02-2009   normal echo,  ef 55-60%  . VAGINAL HYSTERECTOMY  1984  . WRIST SURGERY Left 2000   Past Medical History:  Diagnosis Date  . Anxiety   . Cervical radiculopathy   . Chronic back pain    R > L  . Chronic neck pain   . Depression   . Dysphagia    functional --  takes small bites  . Fibromyalgia   . GERD (gastroesophageal reflux disease)   . Hemorrhoids   . History of ankle fracture 01/08/2012   Status post open reduction internal fixation of a left ankle trimalleolar fracture by Dr. Iona Hansen on 01/10/2012.    Marland Kitchen History of benign thyroid tumor   . History of diabetes mellitus, type II    was diet controlled -- per pt pcp she was no longer diabetic  . History of hiatal hernia   . History of TIA (transient ischemic attack)    02-27-2009  no residual  . HTN (hypertension)   . Hyperlipidemia   . Hypothyroidism, postsurgical   . Lumbar radicular pain    R>L legs  . Migraine headache   . Mild intermittent asthma with allergic rhinitis without complication   . PONV (postoperative nausea and vomiting)   . Rotator cuff syndrome of right shoulder   . Urge urinary incontinence    refractory  . Wears glasses    BP 134/85   Pulse 73   Ht 5' 6.5" (1.689 m)   Wt 203 lb 12.8 oz (92.4 kg)   SpO2 96%   BMI 32.40 kg/m   Opioid Risk Score:   Fall Risk Score:  `1  Depression screen PHQ 2/9  Depression screen [facility] 2/9 03/19/2018 12/26/2017 04/14/2017 03/21/2017 01/19/2017 11/10/2016 10/17/2016  Decreased Interest 1 1 1 3 3 3 3   Down, Depressed, Hopeless 1 1 1 3 3 3 3   PHQ - 2 Score 2 2 2 6 6 6 6   Altered sleeping - - - - - - -  Tired, decreased energy - - - - - - -  Change in appetite - - - - - - -  Feeling bad or failure about yourself  - - - - - - -  Trouble concentrating - - - - - - -  Moving slowly or fidgety/restless - - - - - - -  Suicidal thoughts - - - - - - -  PHQ-9 Score - - - - - - -  Some recent data might be hidden    Review of Systems  Constitutional: Positive for diaphoresis and fever.  HENT: Negative.   Eyes: Negative.   Respiratory: Positive for cough and shortness of breath.   Cardiovascular: Negative.   Gastrointestinal: Positive for diarrhea and nausea.  Endocrine: Negative.   Genitourinary: Negative.   Musculoskeletal: Negative.   Skin: Positive for rash.  Allergic/Immunologic: Negative.   Neurological: Negative.   Hematological: Negative.   Psychiatric/Behavioral: Negative.   All other systems reviewed and are negative.      Objective:   Physical Exam  General: No acute distress HEENT: EOMI, oral membranes moist Cards: reg rate  Chest: normal effort Abdomen: Soft, NT, ND Skin: dry, intact Extremities: no edema Musculoskeletal: generalized tenderness. Pain in lower cervical, upper thoracic spine with palpation. Some increase in pain with scapular rom. Muscles sl taut but no focal tp's.  Neurological:alert, no focal CN findings.   Balance was steady today.  Motor exam is intact at 4+ to 5 out of 5 except for occasional pain inhibition.  No sensory findings noted.   Skin: Skin is warm and dry.  Psychiatric: She is pleasant but remains anxious.  .       Assessment & Plan:  1.History of fibromyalgia. . Continue Gabapentin -provided extensive exercises for neck and shoulder girdle  -needs to remember to keep good posture  -needs to push her aerobic activity. 2. Chronic low back pain.  - She has DDD at L2-3 and L5-S1 most predominantly. She also has associated facet disease  -continuehydrocodone 7.5mg  q8 prn #60. We will continue the controlled substance monitoring program, this consists of regular clinic visits, examinations, routine drug screening, pill counts as well as use of [place] Controlled Substance Reporting System. [place] today.    -UDS today  -Continue Voltaren gel, Neurontin   3. Rotator cuff  syndrome./right bicipital tendonitis :has chronic right arm painContinue with heat and exercise as tolerated.  4. Cervical Radiculopathy: gabapentin 300mg  TID has been fairly effective and tolerable 5. Left ankle pain/prior fracture--ortho has nothing new to offer. 6. Right wrist pain: mild tendonitis/OA. Mild CTS?---although sx and exam is inconsistent.  -voltaren gel  7. Anxiety and depression.   Improved with psychiatry mgt            -on lamictal now             -Continue Effexor (reduced dose?)            - Klonopin at night PER psychiatry 8. Insomnia: I won't increase klonopin.  -suggested use of melatonin -consider sleep study 9. Lateral epicondylitis right -HEP    83minutes of face to face patient care time was spent during this visit with patient and daughter. All questions were encouraged and answered.   F/U in24month with NP

## 2018-03-19 NOTE — Patient Instructions (Signed)
YOU NEED TO MAINTAIN REGULAR EXERCISE!!!!  TRY TO BUILD UP YOUR STAMINA   ?STATIONARY BIKE

## 2018-03-21 DIAGNOSIS — R69 Illness, unspecified: Secondary | ICD-10-CM | POA: Diagnosis not present

## 2018-03-21 DIAGNOSIS — R350 Frequency of micturition: Secondary | ICD-10-CM | POA: Diagnosis not present

## 2018-03-22 DIAGNOSIS — R69 Illness, unspecified: Secondary | ICD-10-CM | POA: Diagnosis not present

## 2018-03-22 DIAGNOSIS — F4310 Post-traumatic stress disorder, unspecified: Secondary | ICD-10-CM | POA: Diagnosis not present

## 2018-03-22 DIAGNOSIS — F411 Generalized anxiety disorder: Secondary | ICD-10-CM | POA: Diagnosis not present

## 2018-03-23 ENCOUNTER — Telehealth: Payer: Self-pay | Admitting: *Deleted

## 2018-03-23 LAB — TOXASSURE SELECT,+ANTIDEPR,UR

## 2018-03-23 NOTE — Telephone Encounter (Signed)
Urine drug screen for this encounter is consistent for prescribed medication 

## 2018-04-11 ENCOUNTER — Other Ambulatory Visit: Payer: Self-pay | Admitting: Registered Nurse

## 2018-04-11 DIAGNOSIS — M47816 Spondylosis without myelopathy or radiculopathy, lumbar region: Secondary | ICD-10-CM

## 2018-04-11 DIAGNOSIS — M797 Fibromyalgia: Secondary | ICD-10-CM

## 2018-04-13 DIAGNOSIS — R69 Illness, unspecified: Secondary | ICD-10-CM | POA: Diagnosis not present

## 2018-04-13 DIAGNOSIS — F4310 Post-traumatic stress disorder, unspecified: Secondary | ICD-10-CM | POA: Diagnosis not present

## 2018-04-13 DIAGNOSIS — F411 Generalized anxiety disorder: Secondary | ICD-10-CM | POA: Diagnosis not present

## 2018-04-18 ENCOUNTER — Encounter: Payer: Self-pay | Admitting: Registered Nurse

## 2018-04-18 ENCOUNTER — Encounter: Payer: Medicare HMO | Attending: Physical Medicine and Rehabilitation | Admitting: Registered Nurse

## 2018-04-18 VITALS — BP 144/78 | HR 66 | Ht 66.0 in | Wt 205.0 lb

## 2018-04-18 DIAGNOSIS — M542 Cervicalgia: Secondary | ICD-10-CM

## 2018-04-18 DIAGNOSIS — Z79891 Long term (current) use of opiate analgesic: Secondary | ICD-10-CM | POA: Diagnosis not present

## 2018-04-18 DIAGNOSIS — R296 Repeated falls: Secondary | ICD-10-CM

## 2018-04-18 DIAGNOSIS — F418 Other specified anxiety disorders: Secondary | ICD-10-CM

## 2018-04-18 DIAGNOSIS — M5416 Radiculopathy, lumbar region: Secondary | ICD-10-CM | POA: Diagnosis not present

## 2018-04-18 DIAGNOSIS — M47816 Spondylosis without myelopathy or radiculopathy, lumbar region: Secondary | ICD-10-CM

## 2018-04-18 DIAGNOSIS — M797 Fibromyalgia: Secondary | ICD-10-CM

## 2018-04-18 DIAGNOSIS — G894 Chronic pain syndrome: Secondary | ICD-10-CM

## 2018-04-18 DIAGNOSIS — M47817 Spondylosis without myelopathy or radiculopathy, lumbosacral region: Secondary | ICD-10-CM | POA: Diagnosis not present

## 2018-04-18 DIAGNOSIS — M19011 Primary osteoarthritis, right shoulder: Secondary | ICD-10-CM | POA: Diagnosis not present

## 2018-04-18 DIAGNOSIS — M546 Pain in thoracic spine: Secondary | ICD-10-CM

## 2018-04-18 DIAGNOSIS — Z79899 Other long term (current) drug therapy: Secondary | ICD-10-CM | POA: Insufficient documentation

## 2018-04-18 DIAGNOSIS — Z5181 Encounter for therapeutic drug level monitoring: Secondary | ICD-10-CM | POA: Diagnosis not present

## 2018-04-18 MED ORDER — HYDROCODONE-ACETAMINOPHEN 7.5-325 MG PO TABS: 1.0000 | ORAL_TABLET | Freq: Two times a day (BID) | ORAL | 0 refills | Status: DC | PRN

## 2018-04-18 NOTE — Progress Notes (Signed)
Subjective:    Patient ID: [name] [name], female    DOB: 02-04-1946, 72 y.o.   MRN: [id number]  HPI: [name] is a 72 year old female who returns for follow up appointment for chronic pain and medication refill. She states her pain is located in her neck mainly right side, mid- lowe back radiating into her bilateral lower extremities and left ankle pain. She rates her pain 8. Her current exercise regime is walking and performing stretching exercises.   Reports two weeks ago she was walking in her bedroom she hit the edge of the bed and landed on her left knee, she was able to get herself up, she didn't seek medical attention. One week ago she tripped over the stool and landed on her knees, she was able to pick herself up. On both falls she didn't have her cane with her. She was educated on falls prevention and instructed to keep her cane with her at all times, she verbalizes understanding.   [name] [name] Morphine Equivalent is 15.00 MME. She is also prescribed Clonazepam by Dr.Aziz..We have discussed the black box warning of using opioids and benzodiazepines. I highlighted the dangers of using these drugs together and discussed the adverse events including respiratory suppression, overdose, cognitive impairment and importance of compliance with current regimen. We will continue to monitor and adjust as indicated.  She is being closely monitored and under the care of her psychiatrist Dr. Jessy Oto.   Last UDS was Performed on 03/19/18, it was consistent.   Pain Inventory Average Pain 8 Pain Right Now 8 My pain is constant  In the last 24 hours, has pain interfered with the following? General activity 8 Relation with others 5 Enjoyment of life 8 What TIME of day is your pain at its worst? daytime Sleep (in general) Fair  Pain is worse with: standing and some activites Pain improves with: rest, therapy/exercise, pacing activities and medication Relief from Meds: 7  Mobility walk with  assistance use a cane Do you have any goals in this area?  yes  Function Do you have any goals in this area?  yes  Neuro/Psych bladder control problems weakness depression anxiety  Prior Studies Any changes since last visit?  no  Physicians involved in your care   Family History  Problem Relation Age of Onset  . Cancer Unknown        breast -- grandmother  . Cancer Unknown        lung -- uncles (3)  . Leukemia Sister   . Diabetes Father   . Coronary artery disease Father   . Diabetes Mother    Social History   Socioeconomic History  . Marital status: Married    Spouse name: Not on file  . Number of children: Not on file  . Years of education: Not on file  . Highest education level: Not on file  Occupational History  . Not on file  Social Needs  . Financial resource strain: Not on file  . Food insecurity:    Worry: Not on file    Inability: Not on file  . Transportation needs:    Medical: Not on file    Non-medical: Not on file  Tobacco Use  . Smoking status: Never Smoker  . Smokeless tobacco: Never Used  Substance and Sexual Activity  . Alcohol use: No  . Drug use: No  . Sexual activity: Not on file  Lifestyle  . Physical activity:    Days  per week: Not on file    Minutes per session: Not on file  . Stress: Not on file  Relationships  . Social connections:    Talks on phone: Not on file    Gets together: Not on file    Attends religious service: Not on file    Active member of club or organization: Not on file    Attends meetings of clubs or organizations: Not on file    Relationship status: Not on file  Other Topics Concern  . Not on file  Social History Narrative  . Not on file   Past Surgical History:  Procedure Laterality Date  . CARDIAC CATHETERIZATION  01-17-2003  dr Darnell Level brodie   normal coronary arteries and LVF,  ef 60%  . EXCISION MORTON'S NEUROMA  2002   bilateral feet  . EXCISION OF BREAST BIOPSY Right 1990's   benign  .  INTERSTIM IMPLANT PLACEMENT N/A 02/10/2015   Procedure: Barrie Lyme IMPLANT FIRST STAGE;  Surgeon: Bjorn Loser, MD;  Location: [facility];  Service: Urology;  Laterality: N/A;  . INTERSTIM IMPLANT PLACEMENT N/A 02/10/2015   Procedure: Barrie Lyme IMPLANT SECOND STAGE;  Surgeon: Bjorn Loser, MD;  Location: [facility];  Service: Urology;  Laterality: N/A;  . LAPAROSCOPIC CHOLECYSTECTOMY  2000  . NEGATIVE SLEEP STUDY  2000   per pt  . ORIF ANKLE FRACTURE  01/10/2012   Procedure: OPEN REDUCTION INTERNAL FIXATION (ORIF) ANKLE FRACTURE;  Surgeon: Sanjuana Kava, MD;  Location: [facility];  Service: Orthopedics;  Laterality: Left;  . TOTAL THYROIDECTOMY  1985   benign tumor  . TRANSTHORACIC ECHOCARDIOGRAM  03-02-2009   normal echo,  ef 55-60%  . VAGINAL HYSTERECTOMY  1984  . WRIST SURGERY Left 2000   Past Medical History:  Diagnosis Date  . Anxiety   . Cervical radiculopathy   . Chronic back pain    R > L  . Chronic neck pain   . Depression   . Dysphagia    functional --  takes small bites  . Fibromyalgia   . GERD (gastroesophageal reflux disease)   . Hemorrhoids   . History of ankle fracture 01/08/2012   Status post open reduction internal fixation of a left ankle trimalleolar fracture by Dr. Iona Hansen on 01/10/2012.    Marland Kitchen History of benign thyroid tumor   . History of diabetes mellitus, type II    was diet controlled -- per pt pcp she was no longer diabetic  . History of hiatal hernia   . History of TIA (transient ischemic attack)    02-27-2009  no residual  . HTN (hypertension)   . Hyperlipidemia   . Hypothyroidism, postsurgical   . Lumbar radicular pain    R>L legs  . Migraine headache   . Mild intermittent asthma with allergic rhinitis without complication   . PONV (postoperative nausea and vomiting)   . Rotator cuff syndrome of right shoulder   . Urge urinary incontinence    refractory  . Wears glasses    BP (!) 144/78   Pulse 66   Ht  5\' 6"  (1.676 m)   Wt 205 lb (93 kg)   SpO2 97%   BMI 33.09 kg/m   Opioid Risk Score:   Fall Risk Score:  `1  Depression screen PHQ 2/9  Depression screen [facility] 2/9 03/19/2018 12/26/2017 04/14/2017 03/21/2017 01/19/2017 11/10/2016 10/17/2016  Decreased Interest 1 1 1 3 3 3 3   Down, Depressed, Hopeless 1 1 1 3  3  3 3  PHQ - 2 Score 2 2 2 6 6 6 6   Altered sleeping - - - - - - -  Tired, decreased energy - - - - - - -  Change in appetite - - - - - - -  Feeling bad or failure about yourself  - - - - - - -  Trouble concentrating - - - - - - -  Moving slowly or fidgety/restless - - - - - - -  Suicidal thoughts - - - - - - -  PHQ-9 Score - - - - - - -  Some recent data might be hidden    Review of Systems  Constitutional: Negative.   HENT: Negative.   Eyes: Negative.   Respiratory: Negative.   Cardiovascular: Negative.   Gastrointestinal: Negative.   Endocrine: Negative.   Genitourinary: Positive for difficulty urinating.  Musculoskeletal: Positive for arthralgias.  Skin: Negative.   Allergic/Immunologic: Negative.   Neurological: Positive for weakness.  Hematological: Negative.   Psychiatric/Behavioral: Positive for dysphoric mood. The patient is nervous/anxious.   All other systems reviewed and are negative.      Objective:   Physical Exam  Constitutional: She is oriented to person, place, and time. She appears well-developed and well-nourished.  HENT:  Head: Normocephalic and atraumatic.  Neck: Normal range of motion. Neck supple.  Pulmonary/Chest: Effort normal and breath sounds normal.  Musculoskeletal:  Normal Muscle Bulk and Muscle Testing Reveals: Upper Extremities: Full ROM and Muscle Strength 5/5 Thoracic Paraspinal Tenderness: T-7-T-9 Lower Extremities: Full ROM and Muscle Strength 5/5 Arises from Table with Ease Narrow Based Gait  Neurological: She is alert and oriented to person, place, and time.  Skin: Skin is warm and dry.  Psychiatric: She has a normal mood and  affect. Her behavior is normal.  Nursing note and vitals reviewed.         Assessment & Plan:  1.History of fibromyalgia.Continue currentMedicationRegimen: HEP as Tolerated and Continue Gabapentin. 04/18/2018 2. Chronicmidline thoracic back pain/low back pain/ Lumbosacral Spondylosis/ Lumbar Facet Arthropathy: Continue HEP as Tolerated. Continue to Monitor. 04/18/18 Refilled:Continue current medication regimen:Hydrocodone 7.5/325mg  one tablet twice a day as needed for pain # 60. 04/18/2018. We will continue the opioid monitoring program, this consists of regular clinic visits, examinations, urine drug screen, pill counts as well as use of [place] Controlled Substance Reporting System. Continue Neurontin. 3.CervicalgiaCervical Radiculopathy:Continue withmedication regimen withGabapentin: 04/18/2018 4. Anxiety and depression.Continue Effexor andKlonopin Psychiatry Prescribing.04/18/2018 6. Multiple Falls: Educated on Falls Prevention, instructed to use cane at all times. She verbalizes understanding. 04/18/18  20 minutes of face to face patient care time was spent during this visit. All questions were encouraged and answered.  F/U in 105month

## 2018-05-03 DIAGNOSIS — S93402A Sprain of unspecified ligament of left ankle, initial encounter: Secondary | ICD-10-CM | POA: Diagnosis not present

## 2018-05-03 DIAGNOSIS — N3946 Mixed incontinence: Secondary | ICD-10-CM | POA: Diagnosis not present

## 2018-05-03 DIAGNOSIS — R7301 Impaired fasting glucose: Secondary | ICD-10-CM | POA: Diagnosis not present

## 2018-05-03 DIAGNOSIS — E039 Hypothyroidism, unspecified: Secondary | ICD-10-CM | POA: Diagnosis not present

## 2018-05-03 DIAGNOSIS — R2689 Other abnormalities of gait and mobility: Secondary | ICD-10-CM | POA: Diagnosis not present

## 2018-05-03 DIAGNOSIS — I10 Essential (primary) hypertension: Secondary | ICD-10-CM | POA: Diagnosis not present

## 2018-05-03 DIAGNOSIS — Z0000 Encounter for general adult medical examination without abnormal findings: Secondary | ICD-10-CM | POA: Diagnosis not present

## 2018-05-03 DIAGNOSIS — E782 Mixed hyperlipidemia: Secondary | ICD-10-CM | POA: Diagnosis not present

## 2018-05-03 DIAGNOSIS — J4520 Mild intermittent asthma, uncomplicated: Secondary | ICD-10-CM | POA: Diagnosis not present

## 2018-05-14 ENCOUNTER — Ambulatory Visit: Payer: Medicare HMO | Admitting: Physical Medicine & Rehabilitation

## 2018-05-15 DIAGNOSIS — F4310 Post-traumatic stress disorder, unspecified: Secondary | ICD-10-CM | POA: Diagnosis not present

## 2018-05-15 DIAGNOSIS — R69 Illness, unspecified: Secondary | ICD-10-CM | POA: Diagnosis not present

## 2018-05-15 DIAGNOSIS — I10 Essential (primary) hypertension: Secondary | ICD-10-CM | POA: Diagnosis not present

## 2018-05-15 DIAGNOSIS — F411 Generalized anxiety disorder: Secondary | ICD-10-CM | POA: Diagnosis not present

## 2018-05-15 DIAGNOSIS — F5105 Insomnia due to other mental disorder: Secondary | ICD-10-CM | POA: Diagnosis not present

## 2018-05-21 DIAGNOSIS — I10 Essential (primary) hypertension: Secondary | ICD-10-CM | POA: Diagnosis not present

## 2018-05-21 DIAGNOSIS — M19172 Post-traumatic osteoarthritis, left ankle and foot: Secondary | ICD-10-CM | POA: Diagnosis not present

## 2018-05-21 DIAGNOSIS — R52 Pain, unspecified: Secondary | ICD-10-CM | POA: Diagnosis not present

## 2018-05-23 ENCOUNTER — Encounter: Payer: Medicare HMO | Attending: Physical Medicine and Rehabilitation | Admitting: Physical Medicine & Rehabilitation

## 2018-05-23 ENCOUNTER — Other Ambulatory Visit: Payer: Self-pay

## 2018-05-23 ENCOUNTER — Encounter: Payer: Self-pay | Admitting: Physical Medicine & Rehabilitation

## 2018-05-23 VITALS — BP 152/88 | HR 68 | Ht 66.0 in | Wt 205.0 lb

## 2018-05-23 DIAGNOSIS — Z79899 Other long term (current) drug therapy: Secondary | ICD-10-CM | POA: Insufficient documentation

## 2018-05-23 DIAGNOSIS — M47816 Spondylosis without myelopathy or radiculopathy, lumbar region: Secondary | ICD-10-CM

## 2018-05-23 DIAGNOSIS — R69 Illness, unspecified: Secondary | ICD-10-CM | POA: Diagnosis not present

## 2018-05-23 DIAGNOSIS — M19011 Primary osteoarthritis, right shoulder: Secondary | ICD-10-CM | POA: Diagnosis not present

## 2018-05-23 DIAGNOSIS — Z5181 Encounter for therapeutic drug level monitoring: Secondary | ICD-10-CM | POA: Insufficient documentation

## 2018-05-23 DIAGNOSIS — M797 Fibromyalgia: Secondary | ICD-10-CM

## 2018-05-23 DIAGNOSIS — F411 Generalized anxiety disorder: Secondary | ICD-10-CM | POA: Diagnosis not present

## 2018-05-23 DIAGNOSIS — G894 Chronic pain syndrome: Secondary | ICD-10-CM | POA: Diagnosis not present

## 2018-05-23 MED ORDER — HYDROCODONE-ACETAMINOPHEN 7.5-325 MG PO TABS: 1.0000 | ORAL_TABLET | Freq: Two times a day (BID) | ORAL | 0 refills | Status: DC | PRN

## 2018-05-23 NOTE — Progress Notes (Signed)
Subjective:    Patient ID: [name] [name], female    DOB: 1945/10/29, 72 y.o.   MRN: [id number]  HPI   [name] [name] is here in follow up of her chronic pain. Since I last saw her she fell in a friend's bedroom and banged up her knees and hips.   She is now wearing an ASO for her left ankle which has "bone on bone arthritis".   She complains of ongoing low back pain especially involving the right side.  She is doing some basic stretching at home and exercising.  She uses her cane to help maintain normal posture mechanics while walking.  Hydrocodone seems to help control the pain to a certain extent.  Denies any pain radiating down the leg.   She is having some tightness and pain in her right hand which she notices at night, particular on the thumb side of the hand.  It feels as if it swollen but does not appear swollen.  She does not wearing any splinting or bracing on the wrist.  Pain Inventory Average Pain 5 Pain Right Now 4 My pain is constant and stabbing  In the last 24 hours, has pain interfered with the following? General activity 8 Relation with others 8 Enjoyment of life 8 What TIME of day is your pain at its worst? night Sleep (in general) Fair  Pain is worse with: walking, standing and some activites Pain improves with: rest, heat/ice, pacing activities, medication and injections Relief from Meds: 6  Mobility walk with assistance use a cane  Function Do you have any goals in this area?  yes  Neuro/Psych bladder control problems trouble walking depression anxiety  Prior Studies x-rays  Physicians involved in your care Orthopedist Dr. Tawni Millers   Family History  Problem Relation Age of Onset  . Cancer Unknown        breast -- grandmother  . Cancer Unknown        lung -- uncles (3)  . Leukemia Sister   . Diabetes Father   . Coronary artery disease Father   . Diabetes Mother    Social History   Socioeconomic History  . Marital status: Married   Spouse name: Not on file  . Number of children: Not on file  . Years of education: Not on file  . Highest education level: Not on file  Occupational History  . Not on file  Social Needs  . Financial resource strain: Not on file  . Food insecurity:    Worry: Not on file    Inability: Not on file  . Transportation needs:    Medical: Not on file    Non-medical: Not on file  Tobacco Use  . Smoking status: Never Smoker  . Smokeless tobacco: Never Used  Substance and Sexual Activity  . Alcohol use: No  . Drug use: No  . Sexual activity: Not on file  Lifestyle  . Physical activity:    Days per week: Not on file    Minutes per session: Not on file  . Stress: Not on file  Relationships  . Social connections:    Talks on phone: Not on file    Gets together: Not on file    Attends religious service: Not on file    Active member of club or organization: Not on file    Attends meetings of clubs or organizations: Not on file    Relationship status: Not on file  Other Topics Concern  . Not on  file  Social History Narrative  . Not on file   Past Surgical History:  Procedure Laterality Date  . CARDIAC CATHETERIZATION  01-17-2003  dr Darnell Level brodie   normal coronary arteries and LVF,  ef 60%  . EXCISION MORTON'S NEUROMA  2002   bilateral feet  . EXCISION OF BREAST BIOPSY Right 1990's   benign  . INTERSTIM IMPLANT PLACEMENT N/A 02/10/2015   Procedure: Barrie Lyme IMPLANT FIRST STAGE;  Surgeon: Bjorn Loser, MD;  Location: [facility];  Service: Urology;  Laterality: N/A;  . INTERSTIM IMPLANT PLACEMENT N/A 02/10/2015   Procedure: Barrie Lyme IMPLANT SECOND STAGE;  Surgeon: Bjorn Loser, MD;  Location: [facility];  Service: Urology;  Laterality: N/A;  . LAPAROSCOPIC CHOLECYSTECTOMY  2000  . NEGATIVE SLEEP STUDY  2000   per pt  . ORIF ANKLE FRACTURE  01/10/2012   Procedure: OPEN REDUCTION INTERNAL FIXATION (ORIF) ANKLE FRACTURE;  Surgeon: Sanjuana Kava, MD;   Location: [facility];  Service: Orthopedics;  Laterality: Left;  . TOTAL THYROIDECTOMY  1985   benign tumor  . TRANSTHORACIC ECHOCARDIOGRAM  03-02-2009   normal echo,  ef 55-60%  . VAGINAL HYSTERECTOMY  1984  . WRIST SURGERY Left 2000   Past Medical History:  Diagnosis Date  . Anxiety   . Cervical radiculopathy   . Chronic back pain    R > L  . Chronic neck pain   . Depression   . Dysphagia    functional --  takes small bites  . Fibromyalgia   . GERD (gastroesophageal reflux disease)   . Hemorrhoids   . History of ankle fracture 01/08/2012   Status post open reduction internal fixation of a left ankle trimalleolar fracture by Dr. Iona Hansen on 01/10/2012.    Marland Kitchen History of benign thyroid tumor   . History of diabetes mellitus, type II    was diet controlled -- per pt pcp she was no longer diabetic  . History of hiatal hernia   . History of TIA (transient ischemic attack)    02-27-2009  no residual  . HTN (hypertension)   . Hyperlipidemia   . Hypothyroidism, postsurgical   . Lumbar radicular pain    R>L legs  . Migraine headache   . Mild intermittent asthma with allergic rhinitis without complication   . PONV (postoperative nausea and vomiting)   . Rotator cuff syndrome of right shoulder   . Urge urinary incontinence    refractory  . Wears glasses    BP (!) 152/88   Pulse 68   Ht 5\' 6"  (1.676 m)   Wt 205 lb (93 kg)   SpO2 95%   BMI 33.09 kg/m   Opioid Risk Score:   Fall Risk Score:  `1  Depression screen PHQ 2/9  Depression screen [facility] 2/9 05/23/2018 03/19/2018 12/26/2017 04/14/2017 03/21/2017 01/19/2017 11/10/2016  Decreased Interest 1 1 1 1 3 3 3   Down, Depressed, Hopeless 1 1 1 1 3 3 3   PHQ - 2 Score 2 2 2 2 6 6 6   Altered sleeping - - - - - - -  Tired, decreased energy - - - - - - -  Change in appetite - - - - - - -  Feeling bad or failure about yourself  - - - - - - -  Trouble concentrating - - - - - - -  Moving slowly or fidgety/restless - - - - - - -    Suicidal thoughts - - - - - - -  PHQ-9 Score - - - - - - -  Some recent data might be hidden    Review of Systems  Constitutional: Positive for appetite change and unexpected weight change.  Eyes: Negative.   Respiratory: Positive for cough and wheezing.   Cardiovascular: Negative.   Gastrointestinal: Positive for abdominal pain, constipation and diarrhea.  Endocrine: Negative.   Genitourinary: Negative.   Musculoskeletal: Negative.   Skin: Positive for rash.  Allergic/Immunologic: Negative.   Neurological: Positive for dizziness. Negative for weakness.  Hematological: Negative.   Psychiatric/Behavioral: Negative.   All other systems reviewed and are negative.      Objective:   Physical Exam General: No acute distress HEENT: EOMI, oral membranes moist Cards: reg rate  Chest: normal effort Abdomen: Soft, NT, ND Skin: dry, intact Extremities: no edema  Musculoskeletal: generalized tenderness. Pain in low back with extension and facet testing.  Pain at left patellar tendon and prepatellar bursa. .  Neurological:alert, no focal CN findings.  Balance was steady today. Motor exam is intact at 4+ to 5 out of 5 except for occasional pain inhibition. No sensory findings noted. +Tinel's right wrist.  Uses a cane for balance and does quite well with this. Skin: Skin is warm and dry.  Psychiatric: She is pleasant as always..  .       Assessment & Plan:  1.History of fibromyalgia. . Continue Gabapentin -continue with sensible HEP 2. Chronic low back pain.  - She has DDD at L2-3 and L5-S1 most predominantly. She also has associated facet disease  -continuehydrocodone 7.5mg  q8 prn #60.     -We will continue the controlled substance monitoring program, this consists of regular clinic visits, examinations, routine drug screening, pill counts as well as use of [place] Controlled Substance Reporting System. [place]  today.           -Continue Voltaren gel, Neurontin   3. Rotator cuff syndrome./right bicipital tendonitis :has chronic right arm painContinue with heat and exercise as tolerated. 4. Cervical Radiculopathy: gabapentin 300mg  TID has been fairly effective and tolerable 5. Left ankle pain/prior fracture/post-trauamtic OA--ASO per ortho 6. Right wrist pain: mild tendonitis/OA. Mild CTS?--- .  -voltaren gel    -Neutral wrist splint at night time 7. Anxiety and depression.  Improved with psychiatry mgt -on lamictal now  -Continue Effexor -Klonopin at night PER psychiatry 8. Insomnia: I won't increase klonopin.  -? melatonin -consider sleep study 9. Lateral epicondylitis right -HEP    15 minutes of face to face patient care time was spent during this visit with patient and daughter. Greater than 50% of time during this encounter was spent counseling patient/family in regard to anatomy of knee, treatment considerations.    F/U in85month with NP

## 2018-05-23 NOTE — Patient Instructions (Addendum)
PLEASE FEEL FREE TO CALL OUR OFFICE WITH ANY PROBLEMS OR QUESTIONS ([phone])   WEAR A BASIC WRIST SPLINT AT NIGHT TO SEE IF THIS HELPS WITH YOUR HAND SYMPTOMS.

## 2018-06-05 ENCOUNTER — Other Ambulatory Visit: Payer: Self-pay | Admitting: Registered Nurse

## 2018-06-05 DIAGNOSIS — Z23 Encounter for immunization: Secondary | ICD-10-CM | POA: Diagnosis not present

## 2018-06-05 DIAGNOSIS — M797 Fibromyalgia: Secondary | ICD-10-CM

## 2018-06-05 DIAGNOSIS — F418 Other specified anxiety disorders: Secondary | ICD-10-CM

## 2018-06-22 ENCOUNTER — Encounter: Payer: Medicare HMO | Attending: Physical Medicine and Rehabilitation | Admitting: Registered Nurse

## 2018-06-22 ENCOUNTER — Other Ambulatory Visit: Payer: Self-pay

## 2018-06-22 VITALS — BP 133/81 | HR 68 | Ht 66.0 in | Wt 208.6 lb

## 2018-06-22 DIAGNOSIS — M546 Pain in thoracic spine: Secondary | ICD-10-CM | POA: Diagnosis not present

## 2018-06-22 DIAGNOSIS — Z79891 Long term (current) use of opiate analgesic: Secondary | ICD-10-CM | POA: Diagnosis not present

## 2018-06-22 DIAGNOSIS — G8929 Other chronic pain: Secondary | ICD-10-CM

## 2018-06-22 DIAGNOSIS — M47816 Spondylosis without myelopathy or radiculopathy, lumbar region: Secondary | ICD-10-CM | POA: Diagnosis not present

## 2018-06-22 DIAGNOSIS — Z5181 Encounter for therapeutic drug level monitoring: Secondary | ICD-10-CM | POA: Diagnosis not present

## 2018-06-22 DIAGNOSIS — G894 Chronic pain syndrome: Secondary | ICD-10-CM | POA: Diagnosis not present

## 2018-06-22 DIAGNOSIS — Z79899 Other long term (current) drug therapy: Secondary | ICD-10-CM | POA: Diagnosis not present

## 2018-06-22 DIAGNOSIS — M19072 Primary osteoarthritis, left ankle and foot: Secondary | ICD-10-CM

## 2018-06-22 DIAGNOSIS — M542 Cervicalgia: Secondary | ICD-10-CM

## 2018-06-22 DIAGNOSIS — M25562 Pain in left knee: Secondary | ICD-10-CM

## 2018-06-22 DIAGNOSIS — M47817 Spondylosis without myelopathy or radiculopathy, lumbosacral region: Secondary | ICD-10-CM

## 2018-06-22 DIAGNOSIS — R69 Illness, unspecified: Secondary | ICD-10-CM | POA: Diagnosis not present

## 2018-06-22 DIAGNOSIS — M5412 Radiculopathy, cervical region: Secondary | ICD-10-CM

## 2018-06-22 DIAGNOSIS — F418 Other specified anxiety disorders: Secondary | ICD-10-CM

## 2018-06-22 DIAGNOSIS — M797 Fibromyalgia: Secondary | ICD-10-CM | POA: Diagnosis not present

## 2018-06-22 DIAGNOSIS — M19011 Primary osteoarthritis, right shoulder: Secondary | ICD-10-CM | POA: Diagnosis not present

## 2018-06-22 DIAGNOSIS — M25561 Pain in right knee: Secondary | ICD-10-CM

## 2018-06-22 MED ORDER — HYDROCODONE-ACETAMINOPHEN 7.5-325 MG PO TABS: 1.0000 | ORAL_TABLET | Freq: Two times a day (BID) | ORAL | 0 refills | Status: DC | PRN

## 2018-06-22 NOTE — Progress Notes (Signed)
Subjective:    Patient ID: [name] [name], female    DOB: 07-22-1945, 72 y.o.   MRN: [id number]  HPI: [name] [name] is a 72 y.o. female who returns for follow up appointment for chronic pain and medication refill. She states her pain is located in her neck radiating into her scalp she reports, mid- lower back, bilateral knees and left ankle.She rates her pain 5. Her current exercise regime is walking and performing stretching exercises.  Ms. Rucci Morphine equivalent is 15.00 MME. She is also prescribed Clonazepam by Dr. Jessy Oto .We have discussed the black box warning of using opioids and benzodiazepines. I highlighted the dangers of using these drugs together and discussed the adverse events including respiratory suppression, overdose, cognitive impairment and importance of compliance with current regimen. We will continue to monitor and adjust as indicated. She is under the care of her psychiatrist Dr. Jessy Oto.   Last UDS was performed on 03/19/2018, it was consistent.   Pain Inventory Average Pain 6 Pain Right Now 5 My pain is constant, sharp, burning and aching  In the last 24 hours, has pain interfered with the following? General activity 5 Relation with others 5 Enjoyment of life 9 What TIME of day is your pain at its worst? daytime night Sleep (in general) Fair  Pain is worse with: bending, inactivity, standing and some activites Pain improves with: rest, heat/ice, therapy/exercise, medication and injections Relief from Meds: 7  Mobility use a cane how many minutes can you walk? 20 ability to climb steps?  yes  Function retired I need assistance with the following:  meal prep, household duties and shopping  Neuro/Psych bladder control problems weakness numbness tingling spasms depression anxiety  Prior Studies Any changes since last visit?  no  Physicians involved in your care Any changes since last visit?  no   Family History  Problem Relation Age of Onset  .  Cancer Unknown        breast -- grandmother  . Cancer Unknown        lung -- uncles (3)  . Leukemia Sister   . Diabetes Father   . Coronary artery disease Father   . Diabetes Mother    Social History   Socioeconomic History  . Marital status: Married    Spouse name: Not on file  . Number of children: Not on file  . Years of education: Not on file  . Highest education level: Not on file  Occupational History  . Not on file  Social Needs  . Financial resource strain: Not on file  . Food insecurity:    Worry: Not on file    Inability: Not on file  . Transportation needs:    Medical: Not on file    Non-medical: Not on file  Tobacco Use  . Smoking status: Never Smoker  . Smokeless tobacco: Never Used  Substance and Sexual Activity  . Alcohol use: No  . Drug use: No  . Sexual activity: Not on file  Lifestyle  . Physical activity:    Days per week: Not on file    Minutes per session: Not on file  . Stress: Not on file  Relationships  . Social connections:    Talks on phone: Not on file    Gets together: Not on file    Attends religious service: Not on file    Active member of club or organization: Not on file    Attends meetings of clubs or organizations: Not on  file    Relationship status: Not on file  Other Topics Concern  . Not on file  Social History Narrative  . Not on file   Past Surgical History:  Procedure Laterality Date  . CARDIAC CATHETERIZATION  01-17-2003  dr Darnell Level brodie   normal coronary arteries and LVF,  ef 60%  . EXCISION MORTON'S NEUROMA  2002   bilateral feet  . EXCISION OF BREAST BIOPSY Right 1990's   benign  . INTERSTIM IMPLANT PLACEMENT N/A 02/10/2015   Procedure: Barrie Lyme IMPLANT FIRST STAGE;  Surgeon: Bjorn Loser, MD;  Location: [facility];  Service: Urology;  Laterality: N/A;  . INTERSTIM IMPLANT PLACEMENT N/A 02/10/2015   Procedure: Barrie Lyme IMPLANT SECOND STAGE;  Surgeon: Bjorn Loser, MD;  Location: [facility];  Service: Urology;  Laterality: N/A;  . LAPAROSCOPIC CHOLECYSTECTOMY  2000  . NEGATIVE SLEEP STUDY  2000   per pt  . ORIF ANKLE FRACTURE  01/10/2012   Procedure: OPEN REDUCTION INTERNAL FIXATION (ORIF) ANKLE FRACTURE;  Surgeon: Sanjuana Kava, MD;  Location: [facility];  Service: Orthopedics;  Laterality: Left;  . TOTAL THYROIDECTOMY  1985   benign tumor  . TRANSTHORACIC ECHOCARDIOGRAM  03-02-2009   normal echo,  ef 55-60%  . VAGINAL HYSTERECTOMY  1984  . WRIST SURGERY Left 2000   Past Medical History:  Diagnosis Date  . Anxiety   . Cervical radiculopathy   . Chronic back pain    R > L  . Chronic neck pain   . Depression   . Dysphagia    functional --  takes small bites  . Fibromyalgia   . GERD (gastroesophageal reflux disease)   . Hemorrhoids   . History of ankle fracture 01/08/2012   Status post open reduction internal fixation of a left ankle trimalleolar fracture by Dr. Iona Hansen on 01/10/2012.    Marland Kitchen History of benign thyroid tumor   . History of diabetes mellitus, type II    was diet controlled -- per pt pcp she was no longer diabetic  . History of hiatal hernia   . History of TIA (transient ischemic attack)    02-27-2009  no residual  . HTN (hypertension)   . Hyperlipidemia   . Hypothyroidism, postsurgical   . Lumbar radicular pain    R>L legs  . Migraine headache   . Mild intermittent asthma with allergic rhinitis without complication   . PONV (postoperative nausea and vomiting)   . Rotator cuff syndrome of right shoulder   . Urge urinary incontinence    refractory  . Wears glasses    BP 133/81   Pulse 68   Ht 5\' 6"  (1.676 m)   Wt 208 lb 9.6 oz (94.6 kg)   SpO2 93%   BMI 33.67 kg/m   Opioid Risk Score:   Fall Risk Score:  `1  Depression screen PHQ 2/9  Depression screen [facility] 2/9 06/22/2018 05/23/2018 03/19/2018 12/26/2017 04/14/2017 03/21/2017 01/19/2017  Decreased Interest 1 1 1 1 1 3 3   Down, Depressed, Hopeless 1 1 1 1 1 3 3   PHQ - 2  Score 2 2 2 2 2 6 6   Altered sleeping - - - - - - -  Tired, decreased energy - - - - - - -  Change in appetite - - - - - - -  Feeling bad or failure about yourself  - - - - - - -  Trouble concentrating - - - - - - -  Moving slowly  or fidgety/restless - - - - - - -  Suicidal thoughts - - - - - - -  PHQ-9 Score - - - - - - -  Some recent data might be hidden    Review of Systems  Constitutional: Positive for diaphoresis and fever.  Eyes: Negative.   Respiratory: Positive for cough and wheezing.   Gastrointestinal: Positive for constipation and nausea.  Endocrine: Negative.   Genitourinary: Negative.   Musculoskeletal: Negative.   Skin: Positive for rash.  Allergic/Immunologic: Negative.   Neurological: Negative.   Hematological: Negative.   Psychiatric/Behavioral: Negative.   All other systems reviewed and are negative.      Objective:   Physical Exam Vitals signs and nursing note reviewed.  Constitutional:      Appearance: Normal appearance.  Neck:     Musculoskeletal: Normal range of motion and neck supple.  Cardiovascular:     Rate and Rhythm: Normal rate and regular rhythm.     Pulses: Normal pulses.     Heart sounds: Normal heart sounds.  Pulmonary:     Effort: Pulmonary effort is normal.     Breath sounds: Normal breath sounds.  Musculoskeletal:     Comments: Normal Muscle Bulk and Muscle Testing Reveals:  Upper Extremities: Full ROM and Muscle Strength 5/5 Thoracic Paraspinal Tenderness: T-7-T-9  Lower Extremities: Full ROM and Muscle Strength 5/5  Arises from Table slowly Narrow Based Gait   Skin:    General: Skin is warm and dry.  Neurological:     Mental Status: She is alert and oriented to person, place, and time.  Psychiatric:        Mood and Affect: Mood normal.        Behavior: Behavior normal.           Assessment & Plan:  1.History of fibromyalgia.Continue currentMedicationRegimen: HEP as Tolerated and Continue Gabapentin. 1213/2019 2.  Chronicmidline thoracic back pain/low back pain/ Lumbosacral Spondylosis/ Lumbar Facet Arthropathy: Continue HEP as Tolerated. Continue to Monitor. 06/22/18 Refilled::Hydrocodone 7.5/325mg  one tablet twice a day as needed for pain # 60. 06/22/2018. We will continue the opioid monitoring program, this consists of regular clinic visits, examinations, urine drug screen, pill counts as well as use of [place] Controlled Substance Reporting System. Continue Neurontin. 3.Cervicalgia/Cervical Radiculopathy:Continue withmedication regimen withGabapentin: 06/22/2018 4. Anxiety and depression.Continue Effexor andKlonopin Psychiatry Prescribing.06/22/2018 5. Left Ankle OA: Wearing AFO: Orthopedist Following.  6. Bilateral Chronic knee Pain: Continue HEP Program. Continue to monitor.   20 minutes of face to face patient care time was spent during this visit. All questions were encouraged and answered.  F/U in 69month

## 2018-06-25 ENCOUNTER — Encounter: Payer: Self-pay | Admitting: Registered Nurse

## 2018-07-05 ENCOUNTER — Other Ambulatory Visit: Payer: Self-pay | Admitting: Physical Medicine & Rehabilitation

## 2018-07-05 DIAGNOSIS — M47816 Spondylosis without myelopathy or radiculopathy, lumbar region: Secondary | ICD-10-CM

## 2018-07-05 DIAGNOSIS — M797 Fibromyalgia: Secondary | ICD-10-CM

## 2018-07-13 DIAGNOSIS — F411 Generalized anxiety disorder: Secondary | ICD-10-CM | POA: Diagnosis not present

## 2018-07-13 DIAGNOSIS — R69 Illness, unspecified: Secondary | ICD-10-CM | POA: Diagnosis not present

## 2018-07-18 ENCOUNTER — Encounter: Payer: Medicare HMO | Attending: Physical Medicine and Rehabilitation | Admitting: Registered Nurse

## 2018-07-18 ENCOUNTER — Encounter: Payer: Self-pay | Admitting: Registered Nurse

## 2018-07-18 VITALS — BP 148/86 | HR 78 | Resp 14 | Ht 66.0 in | Wt 207.0 lb

## 2018-07-18 DIAGNOSIS — G8929 Other chronic pain: Secondary | ICD-10-CM | POA: Diagnosis not present

## 2018-07-18 DIAGNOSIS — G894 Chronic pain syndrome: Secondary | ICD-10-CM

## 2018-07-18 DIAGNOSIS — Z79891 Long term (current) use of opiate analgesic: Secondary | ICD-10-CM | POA: Diagnosis not present

## 2018-07-18 DIAGNOSIS — M19011 Primary osteoarthritis, right shoulder: Secondary | ICD-10-CM | POA: Diagnosis not present

## 2018-07-18 DIAGNOSIS — Z5181 Encounter for therapeutic drug level monitoring: Secondary | ICD-10-CM | POA: Diagnosis not present

## 2018-07-18 DIAGNOSIS — M797 Fibromyalgia: Secondary | ICD-10-CM

## 2018-07-18 DIAGNOSIS — Z79899 Other long term (current) drug therapy: Secondary | ICD-10-CM | POA: Diagnosis not present

## 2018-07-18 DIAGNOSIS — F418 Other specified anxiety disorders: Secondary | ICD-10-CM

## 2018-07-18 DIAGNOSIS — M47817 Spondylosis without myelopathy or radiculopathy, lumbosacral region: Secondary | ICD-10-CM

## 2018-07-18 DIAGNOSIS — M546 Pain in thoracic spine: Secondary | ICD-10-CM | POA: Diagnosis not present

## 2018-07-18 DIAGNOSIS — M47816 Spondylosis without myelopathy or radiculopathy, lumbar region: Secondary | ICD-10-CM

## 2018-07-18 DIAGNOSIS — R21 Rash and other nonspecific skin eruption: Secondary | ICD-10-CM | POA: Diagnosis not present

## 2018-07-18 DIAGNOSIS — R69 Illness, unspecified: Secondary | ICD-10-CM | POA: Diagnosis not present

## 2018-07-18 MED ORDER — HYDROCODONE-ACETAMINOPHEN 7.5-325 MG PO TABS: 1.0000 | ORAL_TABLET | Freq: Two times a day (BID) | ORAL | 0 refills | Status: DC | PRN

## 2018-07-18 NOTE — Progress Notes (Signed)
Subjective:    Patient ID: [name] [name], female    DOB: [birth year], 73 y.o.   MRN: [id number]  HPI: [name] [name] is a 73 y.o. female who returns for follow up appointment for chronic pain and medication refill. She  States her pain is located in her lower back. Also reports she has a rash on her bilateral hands she noticed increase intensity of pain with new lotion. She has discontinued the lotion. Also reports she will follow up with her PCP and dermatologist she states. She rates her pain 4. Her current exercise regime is walking and performing stretching exercises.  [name] [name] Morphine equivalent is 15.00 MME. Sheis also prescribed Clonazepam  by Vicenta Aly.We have discussed the black box warning of using opioids and benzodiazepines. I highlighted the dangers of using these drugs together and discussed the adverse events including respiratory suppression, overdose, cognitive impairment and importance of compliance with current regimen. We will continue to monitor and adjust as indicated.  She  is being closely monitored and under the care of her psychiatrist Dr. Jessy Oto   Pain Inventory Average Pain 5 Pain Right Now 4 My pain is stabbing and tingling  In the last 24 hours, has pain interfered with the following? General activity 5 Relation with others 6 Enjoyment of life 8 What TIME of day is your pain at its worst? daytime , evening Sleep (in general) Fair  Pain is worse with: standing and some activites Pain improves with: rest, therapy/exercise, medication and injections Relief from Meds: 6  Mobility walk with assistance use a cane ability to climb steps?  yes do you drive?  yes Do you have any goals in this area?  yes  Function retired I need assistance with the following:  meal prep, household duties and shopping Do you have any goals in this area?  yes  Neuro/Psych bladder control problems weakness tremor tingling trouble  walking confusion depression anxiety  Prior Studies Any changes since last visit?  no  Physicians involved in your care Any changes since last visit?  no   Family History  Problem Relation Age of Onset  . Cancer Other        breast -- grandmother  . Cancer Other        lung -- uncles (3)  . Leukemia Sister   . Diabetes Father   . Coronary artery disease Father   . Diabetes Mother    Social History   Socioeconomic History  . Marital status: Married    Spouse name: Not on file  . Number of children: Not on file  . Years of education: Not on file  . Highest education level: Not on file  Occupational History  . Not on file  Social Needs  . Financial resource strain: Not on file  . Food insecurity:    Worry: Not on file    Inability: Not on file  . Transportation needs:    Medical: Not on file    Non-medical: Not on file  Tobacco Use  . Smoking status: Never Smoker  . Smokeless tobacco: Never Used  Substance and Sexual Activity  . Alcohol use: No  . Drug use: No  . Sexual activity: Not on file  Lifestyle  . Physical activity:    Days per week: Not on file    Minutes per session: Not on file  . Stress: Not on file  Relationships  . Social connections:    Talks on phone: Not on file  Gets together: Not on file    Attends religious service: Not on file    Active member of club or organization: Not on file    Attends meetings of clubs or organizations: Not on file    Relationship status: Not on file  Other Topics Concern  . Not on file  Social History Narrative  . Not on file   Past Surgical History:  Procedure Laterality Date  . CARDIAC CATHETERIZATION  01-17-2003  dr Darnell Level brodie   normal coronary arteries and LVF,  ef 60%  . EXCISION MORTON'S NEUROMA  2002   bilateral feet  . EXCISION OF BREAST BIOPSY Right 1990's   benign  . INTERSTIM IMPLANT PLACEMENT N/A 02/10/2015   Procedure: Barrie Lyme IMPLANT FIRST STAGE;  Surgeon: Bjorn Loser, MD;   Location: [facility];  Service: Urology;  Laterality: N/A;  . INTERSTIM IMPLANT PLACEMENT N/A 02/10/2015   Procedure: Barrie Lyme IMPLANT SECOND STAGE;  Surgeon: Bjorn Loser, MD;  Location: [facility];  Service: Urology;  Laterality: N/A;  . LAPAROSCOPIC CHOLECYSTECTOMY  2000  . NEGATIVE SLEEP STUDY  2000   per pt  . ORIF ANKLE FRACTURE  01/10/2012   Procedure: OPEN REDUCTION INTERNAL FIXATION (ORIF) ANKLE FRACTURE;  Surgeon: Sanjuana Kava, MD;  Location: [facility];  Service: Orthopedics;  Laterality: Left;  . TOTAL THYROIDECTOMY  1985   benign tumor  . TRANSTHORACIC ECHOCARDIOGRAM  03-02-2009   normal echo,  ef 55-60%  . VAGINAL HYSTERECTOMY  1984  . WRIST SURGERY Left 2000   Past Medical History:  Diagnosis Date  . Anxiety   . Cervical radiculopathy   . Chronic back pain    R > L  . Chronic neck pain   . Depression   . Dysphagia    functional --  takes small bites  . Fibromyalgia   . GERD (gastroesophageal reflux disease)   . Hemorrhoids   . History of ankle fracture 01/08/2012   Status post open reduction internal fixation of a left ankle trimalleolar fracture by Dr. Iona Hansen on 01/10/2012.    Marland Kitchen History of benign thyroid tumor   . History of diabetes mellitus, type II    was diet controlled -- per pt pcp she was no longer diabetic  . History of hiatal hernia   . History of TIA (transient ischemic attack)    02-27-2009  no residual  . HTN (hypertension)   . Hyperlipidemia   . Hypothyroidism, postsurgical   . Lumbar radicular pain    R>L legs  . Migraine headache   . Mild intermittent asthma with allergic rhinitis without complication   . PONV (postoperative nausea and vomiting)   . Rotator cuff syndrome of right shoulder   . Urge urinary incontinence    refractory  . Wears glasses    BP (!) 148/86   Pulse 78   Resp 14   Ht 5\' 6"  (1.676 m)   Wt 207 lb (93.9 kg)   SpO2 91%   BMI 33.41 kg/m   Opioid Risk Score:   Fall Risk  Score:  `1  Depression screen PHQ 2/9  Depression screen [facility] 2/9 06/22/2018 05/23/2018 03/19/2018 12/26/2017 04/14/2017 03/21/2017 01/19/2017  Decreased Interest 1 1 1 1 1 3 3   Down, Depressed, Hopeless 1 1 1 1 1 3 3   PHQ - 2 Score 2 2 2 2 2 6 6   Altered sleeping - - - - - - -  Tired, decreased energy - - - - - - -  Change in appetite - - - - - - -  Feeling bad or failure about yourself  - - - - - - -  Trouble concentrating - - - - - - -  Moving slowly or fidgety/restless - - - - - - -  Suicidal thoughts - - - - - - -  PHQ-9 Score - - - - - - -  Some recent data might be hidden    Review of Systems  Constitutional: Positive for chills, diaphoresis and fever.  HENT: Negative.   Eyes: Negative.   Respiratory: Positive for wheezing.   Cardiovascular: Positive for leg swelling.  Gastrointestinal: Positive for constipation and nausea.  Endocrine: Negative.   Genitourinary: Positive for difficulty urinating.  Musculoskeletal: Positive for arthralgias, back pain, gait problem and neck pain.  Skin: Positive for rash.  Allergic/Immunologic: Negative.   Neurological: Positive for tremors and weakness.  Psychiatric/Behavioral: Positive for confusion and dysphoric mood. The patient is nervous/anxious.   All other systems reviewed and are negative.      Objective:   Physical Exam Vitals signs and nursing note reviewed.  Constitutional:      Appearance: Normal appearance.  Neck:     Musculoskeletal: Normal range of motion and neck supple.  Cardiovascular:     Rate and Rhythm: Normal rate and regular rhythm.     Pulses: Normal pulses.     Heart sounds: Normal heart sounds.  Pulmonary:     Effort: Pulmonary effort is normal.     Breath sounds: Normal breath sounds.  Musculoskeletal:     Comments: Normal Muscle Bulk and Muscle Testing Reveals:  Upper Extremities: Full ROM and Muscle Strength 5/5  Back without spinal tenderness noted Lower Extremities: Full ROM and Muscle Strength 5/5   Arises from Table with ease Narrow Based Gait   Skin:    General: Skin is warm and dry.     Comments: Bilateral Hands with ( reddened)  Dermatis Noted   Neurological:     Mental Status: She is alert and oriented to person, place, and time.  Psychiatric:        Mood and Affect: Mood normal.        Behavior: Behavior normal.           Assessment & Plan:  1.History of fibromyalgia.Continue currentMedicationRegimen: HEP as Tolerated and Continue Gabapentin.07/18/2018 2. Chronicmidline thoracic back pain/low back pain/ Lumbosacral Spondylosis/ Lumbar Facet Arthropathy: Continue HEP as Tolerated. Continue to Monitor. 07/18/18 Refilled::Hydrocodone 7.5/325mg  one tablet twice a day as needed for pain # 60.07/18/2018 We will continue the opioid monitoring program, this consists of regular clinic visits, examinations, urine drug screen, pill counts as well as use of [place] Controlled Substance Reporting System. Continue Neurontin. 3.Cervicalgia/Cervical Radiculopathy:Continue withmedication regimen withGabapentin: 07/18/2018 4. Anxiety and depression.Continue Effexor andKlonopin Psychiatry Prescribing.07/18/2018 5. Left Ankle OA: Wearing AFO: Orthopedist Following. 07/18/2018 6. Bilateral Chronic knee Pain: No complaints today.Continue HEP Program. Continue to monitor. 07/18/2018 7. Bilateral Hands Rash/ Dermatis: She will F/U with her PCP and Dermatologist she reports.   20 minutes of face to face patient care time was spent during this visit. All questions were encouraged and answered.  F/U in 72month

## 2018-07-24 DIAGNOSIS — F5105 Insomnia due to other mental disorder: Secondary | ICD-10-CM | POA: Diagnosis not present

## 2018-07-24 DIAGNOSIS — F411 Generalized anxiety disorder: Secondary | ICD-10-CM | POA: Diagnosis not present

## 2018-07-24 DIAGNOSIS — Z79899 Other long term (current) drug therapy: Secondary | ICD-10-CM | POA: Diagnosis not present

## 2018-07-24 DIAGNOSIS — F4310 Post-traumatic stress disorder, unspecified: Secondary | ICD-10-CM | POA: Diagnosis not present

## 2018-07-24 DIAGNOSIS — R69 Illness, unspecified: Secondary | ICD-10-CM | POA: Diagnosis not present

## 2018-07-25 ENCOUNTER — Encounter (HOSPITAL_COMMUNITY): Payer: Self-pay | Admitting: Emergency Medicine

## 2018-07-25 ENCOUNTER — Emergency Department (HOSPITAL_COMMUNITY): Payer: Medicare HMO

## 2018-07-25 ENCOUNTER — Emergency Department (HOSPITAL_COMMUNITY)
Admission: EM | Admit: 2018-07-25 | Discharge: 2018-07-25 | Disposition: A | Discharge status: Home / Self Care | Payer: Medicare HMO | Attending: Emergency Medicine | Admitting: Emergency Medicine

## 2018-07-25 ENCOUNTER — Other Ambulatory Visit: Payer: Self-pay

## 2018-07-25 DIAGNOSIS — K5900 Constipation, unspecified: Secondary | ICD-10-CM | POA: Diagnosis not present

## 2018-07-25 DIAGNOSIS — I10 Essential (primary) hypertension: Secondary | ICD-10-CM | POA: Diagnosis not present

## 2018-07-25 DIAGNOSIS — Z79899 Other long term (current) drug therapy: Secondary | ICD-10-CM | POA: Diagnosis not present

## 2018-07-25 DIAGNOSIS — E039 Hypothyroidism, unspecified: Secondary | ICD-10-CM | POA: Insufficient documentation

## 2018-07-25 DIAGNOSIS — N281 Cyst of kidney, acquired: Secondary | ICD-10-CM | POA: Diagnosis not present

## 2018-07-25 DIAGNOSIS — R110 Nausea: Secondary | ICD-10-CM | POA: Diagnosis not present

## 2018-07-25 DIAGNOSIS — Z7982 Long term (current) use of aspirin: Secondary | ICD-10-CM | POA: Insufficient documentation

## 2018-07-25 DIAGNOSIS — J4520 Mild intermittent asthma, uncomplicated: Secondary | ICD-10-CM | POA: Diagnosis not present

## 2018-07-25 DIAGNOSIS — R109 Unspecified abdominal pain: Secondary | ICD-10-CM

## 2018-07-25 DIAGNOSIS — R1031 Right lower quadrant pain: Secondary | ICD-10-CM | POA: Diagnosis not present

## 2018-07-25 DIAGNOSIS — R195 Other fecal abnormalities: Secondary | ICD-10-CM | POA: Diagnosis not present

## 2018-07-25 LAB — CBC
HEMATOCRIT: 41 % (ref 36.0–46.0)
Hemoglobin: 12.7 g/dL (ref 12.0–15.0)
MCH: 29.9 pg (ref 26.0–34.0)
MCHC: 31 g/dL (ref 30.0–36.0)
MCV: 96.5 fL (ref 80.0–100.0)
Platelets: 313 10*3/uL (ref 150–400)
RBC: 4.25 MIL/uL (ref 3.87–5.11)
RDW: 12.9 % (ref 11.5–15.5)
WBC: 5.7 10*3/uL (ref 4.0–10.5)
nRBC: 0 % (ref 0.0–0.2)

## 2018-07-25 LAB — URINALYSIS, ROUTINE W REFLEX MICROSCOPIC
Bacteria, UA: NONE SEEN
Bilirubin Urine: NEGATIVE
Glucose, UA: NEGATIVE mg/dL
KETONES UR: NEGATIVE mg/dL
Leukocytes, UA: NEGATIVE
NITRITE: NEGATIVE
Protein, ur: NEGATIVE mg/dL
Specific Gravity, Urine: 1.009 (ref 1.005–1.030)
pH: 7 (ref 5.0–8.0)

## 2018-07-25 LAB — COMPREHENSIVE METABOLIC PANEL
ALT: 21 U/L (ref 0–44)
AST: 20 U/L (ref 15–41)
Albumin: 4.9 g/dL (ref 3.5–5.0)
Alkaline Phosphatase: 98 U/L (ref 38–126)
Anion gap: 9 (ref 5–15)
BUN: 15 mg/dL (ref 8–23)
CHLORIDE: 102 mmol/L (ref 98–111)
CO2: 26 mmol/L (ref 22–32)
Calcium: 9.1 mg/dL (ref 8.9–10.3)
Creatinine, Ser: 0.74 mg/dL (ref 0.44–1.00)
GFR calc Af Amer: >60 mL/min (ref >60)
GFR calc non Af Amer: >60 mL/min (ref >60)
Glucose, Bld: 106 mg/dL — ABNORMAL HIGH (ref 70–99)
Potassium: 4.3 mmol/L (ref 3.5–5.1)
Sodium: 137 mmol/L (ref 135–145)
Total Bilirubin: 0.7 mg/dL (ref 0.3–1.2)
Total Protein: 7.7 g/dL (ref 6.5–8.1)

## 2018-07-25 LAB — LIPASE, BLOOD: Lipase: 40 U/L (ref 11–51)

## 2018-07-25 MED ORDER — IOPAMIDOL (ISOVUE-300) INJECTION 61%
100.0000 mL | Freq: Once | INTRAVENOUS | Status: AC | PRN
  Administered 2018-07-25: 100 mL via INTRAVENOUS

## 2018-07-25 MED ORDER — SODIUM CHLORIDE (PF) 0.9 % IJ SOLN
INTRAMUSCULAR | Status: AC
  Filled 2018-07-25: qty 50

## 2018-07-25 MED ORDER — POLYETHYLENE GLYCOL 3350 17 G PO PACK
17.0000 g | PACK | Freq: Every day | ORAL | Status: DC
  Administered 2018-07-25: 17 g via ORAL
  Filled 2018-07-25: qty 1

## 2018-07-25 MED ORDER — SODIUM CHLORIDE 0.9% FLUSH: 3.0000 mL | Freq: Once | INTRAVENOUS | Status: DC

## 2018-07-25 NOTE — ED Triage Notes (Signed)
Patient c/o RLQ abdominal pain x5 days. Reports nausea. Denies V/D.

## 2018-07-25 NOTE — ED Provider Notes (Signed)
[facility] DEPT Provider Note   CSN: [id number] Arrival date & time: 07/25/18  1346     History   Chief Complaint Chief Complaint  Patient presents with  . Abdominal Pain    HPI [name] [name] is a 73 y.o. female presenting for evaluation of right lower quadrant pain.  Patient states the past several days, she has been having right lower quadrant abdominal pain.  Pain radiates to her back.  She reports associated nausea.  She denies fevers, chills, chest pain, shortness of breath, vomiting, pain in the left side, urinary symptoms, or change in bowel movements.  However, patient states that she has had an issue with constipation for many years.  She takes MiraLAX occasionally.  Patient states she has had pain like this intermittently for the past several years.  She has never had this evaluated or worked up before.  She has not talked to her GI doctor about this.  She sees Dr. Henrene Pastor with [facility].  Today, her pain is constant and sharp.  Nothing makes it better or worse.  No change with p.o. intake, bowel movements, or urination.  She has not taken anything for pain including Tylenol or ibuprofen.  EMR reviewed, pt without abdominal imaging in the past several years.   HPI  Past Medical History:  Diagnosis Date  . Anxiety   . Cervical radiculopathy   . Chronic back pain    R > L  . Chronic neck pain   . Depression   . Dysphagia    functional --  takes small bites  . Fibromyalgia   . GERD (gastroesophageal reflux disease)   . Hemorrhoids   . History of ankle fracture 01/08/2012   Status post open reduction internal fixation of a left ankle trimalleolar fracture by Dr. Iona Hansen on 01/10/2012.    Marland Kitchen History of benign thyroid tumor   . History of diabetes mellitus, type II    was diet controlled -- per pt pcp she was no longer diabetic  . History of hiatal hernia   . History of TIA (transient ischemic attack)    02-27-2009  no residual  .  HTN (hypertension)   . Hyperlipidemia   . Hypothyroidism, postsurgical   . Lumbar radicular pain    R>L legs  . Migraine headache   . Mild intermittent asthma with allergic rhinitis without complication   . PONV (postoperative nausea and vomiting)   . Rotator cuff syndrome of right shoulder   . Urge urinary incontinence    refractory  . Wears glasses     Patient Active Problem List   Diagnosis Date Noted  . Palpitations 06/15/2015  . Risk for falls 03/23/2015  . Depression with anxiety 08/26/2014  . Lumbosacral spondylosis without myelopathy 08/20/2013  . Lumbar facet arthropathy 08/20/2013  . Tension headache 08/19/2013  . Healthcare maintenance 08/19/2013  . Greater trochanteric bursitis of left hip 02/12/2013  . Osteoarthritis of right hand 11/12/2012  . Biceps tendinitis on right 11/12/2012  . Cervical radiculopathy at C7 left 10/14/2011  . Rotator cuff syndrome 10/14/2011  . HYPERCHOLESTEROLEMIA 11/14/2007  . Migraine without aura 10/25/2006  . Thoracic back pain 10/25/2006  . Fibromyalgia 10/25/2006  . Hypothyroidism 04/11/2006  . Essential hypertension 04/11/2006  . Gastroesophageal reflux disease 04/11/2006    Past Surgical History:  Procedure Laterality Date  . CARDIAC CATHETERIZATION  01-17-2003  dr Darnell Level brodie   normal coronary arteries and LVF,  ef 60%  . EXCISION MORTON'S  NEUROMA  2002   bilateral feet  . EXCISION OF BREAST BIOPSY Right 1990's   benign  . INTERSTIM IMPLANT PLACEMENT N/A 02/10/2015   Procedure: Barrie Lyme IMPLANT FIRST STAGE;  Surgeon: Bjorn Loser, MD;  Location: [facility];  Service: Urology;  Laterality: N/A;  . INTERSTIM IMPLANT PLACEMENT N/A 02/10/2015   Procedure: Barrie Lyme IMPLANT SECOND STAGE;  Surgeon: Bjorn Loser, MD;  Location: [facility];  Service: Urology;  Laterality: N/A;  . LAPAROSCOPIC CHOLECYSTECTOMY  2000  . NEGATIVE SLEEP STUDY  2000   per pt  . ORIF ANKLE FRACTURE  01/10/2012    Procedure: OPEN REDUCTION INTERNAL FIXATION (ORIF) ANKLE FRACTURE;  Surgeon: Sanjuana Kava, MD;  Location: [facility];  Service: Orthopedics;  Laterality: Left;  . TOTAL THYROIDECTOMY  1985   benign tumor  . TRANSTHORACIC ECHOCARDIOGRAM  03-02-2009   normal echo,  ef 55-60%  . VAGINAL HYSTERECTOMY  1984  . WRIST SURGERY Left 2000     OB History   No obstetric history on file.      Home Medications    Prior to Admission medications   Medication Sig Start Date End Date Taking? Authorizing Provider  albuterol (PROVENTIL HFA;VENTOLIN HFA) 108 (90 Base) MCG/ACT inhaler Inhale 2 puffs into the lungs every 4 (four) hours as needed for wheezing or shortness of breath.  03/24/17  Yes [provider]  aspirin 81 MG chewable tablet Chew 81 mg by mouth daily.   Yes [provider]  azelastine (ASTELIN) 0.1 % nasal spray Place 2 sprays into both nostrils 2 (two) times daily.  05/08/18  Yes [provider]  clonazePAM (KLONOPIN) 0.5 MG tablet Take 0.5 mg by mouth daily as needed for anxiety.   Yes Atha Starks, MD  gabapentin (NEURONTIN) 300 MG capsule TAKE ONE CAPSULE BY MOUTH FOUR TIMES A DAY Patient taking differently: Take 300 mg by mouth 4 (four) times daily. TAKE ONE CAPSULE BY MOUTH FOUR TIMES A DAY 07/05/18  Yes Meredith Staggers, MD  HYDROcodone-acetaminophen [facility]) 7.5-325 MG tablet Take 1 tablet by mouth 2 (two) times daily as needed for moderate pain. 07/18/18  Yes Bayard Hugger, NP  lamoTRIgine (LAMICTAL) 100 MG tablet Take 200 mg by mouth at bedtime.  08/18/17 07/25/18 Yes [provider]  levothyroxine (SYNTHROID, LEVOTHROID) 100 MCG tablet TAKE 1 TABLET(100 MCG) BY MOUTH DAILY BEFORE BREAKFAST Patient taking differently: Take 100 mcg by mouth daily before breakfast.  01/13/16  Yes Axel Filler, MD  losartan (COZAAR) 100 MG tablet Take 100 mg by mouth daily.  03/24/17  Yes [provider]  magnesium hydroxide (MILK OF MAGNESIA) 400 MG/5ML  suspension Take 30 mLs by mouth daily as needed for mild constipation.   Yes [provider]  metoprolol tartrate (LOPRESSOR) 25 MG tablet Take 25 mg by mouth 2 (two) times daily.  03/24/17  Yes [provider]  omeprazole (PRILOSEC) 40 MG capsule Take 1 capsule (40 mg total) by mouth 2 (two) times daily. 11/23/15  Yes Oval Linsey, MD  polyethylene glycol [facility] / GLYCOLAX) packet Take 17 g by mouth daily as needed for mild constipation.   Yes [provider]  pravastatin (PRAVACHOL) 20 MG tablet TAKE 1 TABLET (20 MG TOTAL) BY MOUTH DAILY. 03/23/15  Yes Axel Filler, MD  triamcinolone ointment (KENALOG) 0.5 % Apply 1 application topically daily as needed (hand rash).  11/01/17 11/01/18 Yes [provider]  venlafaxine (EFFEXOR) 100 MG tablet TAKE ONE TABLET BY MOUTH THREE  TIMES A DAY 06/05/18  Yes Bayard Hugger, NP  QUEtiapine (SEROQUEL XR) 50 MG TB24 24 hr tablet Take 50 mg by mouth at bedtime. 07/24/18 09/22/18  [provider]    Family History Family History  Problem Relation Age of Onset  . Cancer Other        breast -- grandmother  . Cancer Other        lung -- uncles (3)  . Leukemia Sister   . Diabetes Father   . Coronary artery disease Father   . Diabetes Mother     Social History Social History   Tobacco Use  . Smoking status: Never Smoker  . Smokeless tobacco: Never Used  Substance Use Topics  . Alcohol use: No  . Drug use: No     Allergies   Diclofenac sodium; Diclofenac sodium; Nsaids; Penicillins; Relafen [nabumetone]; Other; Adhesive [tape]; Codeine; Ex-lax [senna]; Trazodone and nefazodone; and Neosporin [neomycin-bacitracin zn-polymyx]   Review of Systems Review of Systems  Gastrointestinal: Positive for abdominal pain and nausea.  All other systems reviewed and are negative.    Physical Exam Updated Vital Signs BP (!) 151/78 (BP Location: Left Arm)   Pulse 66   Temp 98.4 F (36.9 C) (Oral)    Resp 16   Ht 5\' 6"  (1.676 m)   Wt 92.5 kg   SpO2 99%   BMI 32.93 kg/m   Physical Exam Vitals signs and nursing note reviewed.  Constitutional:      General: She is not in acute distress.    Appearance: She is well-developed.     Comments: Laying comfortably in the bed in no acute distress  HENT:     Head: Normocephalic and atraumatic.  Eyes:     Conjunctiva/sclera: Conjunctivae normal.     Pupils: Pupils are equal, round, and reactive to light.  Neck:     Musculoskeletal: Normal range of motion and neck supple.  Cardiovascular:     Rate and Rhythm: Normal rate and regular rhythm.     Pulses: Normal pulses.  Pulmonary:     Effort: Pulmonary effort is normal. No respiratory distress.     Breath sounds: Normal breath sounds. No wheezing.  Abdominal:     General: Bowel sounds are normal. There is no distension.     Palpations: Abdomen is soft.     Tenderness: There is abdominal tenderness.       Comments: Tenderness palpation of right sided abdomen.  Negative Murphy's.  Negative rebound.  No CVA tenderness.  No rigidity, guarding, distention.  Musculoskeletal: Normal range of motion.  Skin:    General: Skin is warm and dry.     Capillary Refill: Capillary refill takes less than 2 seconds.  Neurological:     Mental Status: She is alert and oriented to person, place, and time.      ED Treatments / Results  Labs (all labs ordered are listed, but only abnormal results are displayed) Labs Reviewed  COMPREHENSIVE METABOLIC PANEL - Abnormal; Notable for the following components:      Result Value   Glucose, Bld 106 (*)    All other components within normal limits  URINALYSIS, ROUTINE W REFLEX MICROSCOPIC - Abnormal; Notable for the following components:   Hgb urine dipstick SMALL (*)    All other components within normal limits  LIPASE, BLOOD  CBC    EKG None  Radiology Ct Abdomen Pelvis W Contrast  Result Date: 07/25/2018 CLINICAL DATA:  73 year old female with  right lower  quadrant abdominal pain for the past 5 days. EXAM: CT ABDOMEN AND PELVIS WITH CONTRAST TECHNIQUE: Multidetector CT imaging of the abdomen and pelvis was performed using the standard protocol following bolus administration of intravenous contrast. CONTRAST:  168mL ISOVUE-300 IOPAMIDOL (ISOVUE-300) INJECTION 61% COMPARISON:  Prior CT scan of the abdomen and pelvis 01/20/2014 FINDINGS: Lower chest: The lung bases are clear. Visualized cardiac structures are within normal limits for size. No pericardial effusion. Unremarkable visualized distal thoracic esophagus. Hepatobiliary: The gallbladder is surgically absent. Stable mild dilatation of the common bile duct measuring up to 11 mm at the pancreatic head. No definite choledocholithiasis. No significant interval change compared to 2015. Normal hepatic contour morphology. No discrete hepatic lesions. Pancreas: Unremarkable. No pancreatic ductal dilatation or surrounding inflammatory changes. Spleen: Normal in size without focal abnormality. Adrenals/Urinary Tract: Normal adrenal glands. No evidence of hydronephrosis or nephrolithiasis. Minimal interval enlargement of a simple cyst in the interpolar right kidney. The ureters and bladder are unremarkable. Stomach/Bowel: Large colonic stool burden consistent with constipation. No focal bowel wall thickening or evidence of obstruction. Normal appendix in the right lower quadrant. Vascular/Lymphatic: No significant vascular findings are present. No enlarged abdominal or pelvic lymph nodes. Reproductive: Surgical changes of prior hysterectomy. No adnexal masses. Other: Pelvic floor laxity. No abdominal hernia or ascites. Right sacral nerve root stimulation device. Musculoskeletal: No acute fracture or aggressive appearing lytic or blastic osseous lesion. L5-S1 degenerative disc disease. Bilateral L4-L5 and L5-S1 facet arthropathy. IMPRESSION: 1. No acute abnormality within the abdomen or pelvis. 2. Large colonic  stool burden suggests constipation. 3. The appendix is normal. 4. Lower lumbar degenerative disc disease and facet arthropathy. 5. Stable dilatation of the common bile duct status post cholecystectomy dating back to 2015. Electronically Signed   By: Jacqulynn Cadet M.D.   On: 07/25/2018 20:37    Procedures Procedures (including critical care time)  Medications Ordered in ED Medications  polyethylene glycol (MIRALAX / GLYCOLAX) packet 17 g (17 g Oral Given 07/25/18 2151)  iopamidol (ISOVUE-300) 61 % injection 100 mL (100 mLs Intravenous Contrast Given 07/25/18 2013)     Initial Impression / Assessment and Plan / ED Course  I have reviewed the triage vital signs and the nursing notes.  Pertinent labs & imaging results that were available during my care of the patient were reviewed by me and considered in my medical decision making (see chart for details).     Patient presenting for evaluation of abdominal pain with nausea.  Physical exam reassuring, she is afebrile not tachycardic.  Appears nontoxic.  Labs reassuring, no leukocytosis.  Kidney, liver, pancreatic function reassuring.  Patient has had similar pain for the past several years, as such, doubt acute or life-threatening etiology.  However, considering her age, and lack of imaging in our EMR, will order CT. Consider kidney stone vs appendicitis vs constipation.   CT shows constipation.  Does not see any other acute or life-threatening causes for patient's pain.  Discussed findings with patient and family.  Discussed importance of hydration, use of regular MiraLAX, and follow-up with GI for further evaluation.  At this time, patient appears safe for discharge.  Return precautions given.  Patient states she understands and agrees plan.   Final Clinical Impressions(s) / ED Diagnoses   Final diagnoses:  Constipation, unspecified constipation type  Right sided abdominal pain    ED Discharge Orders    None       Franchot Heidelberg, PA-C 07/25/18 2234    Davonna Belling, MD  07/25/18 2315  

## 2018-07-25 NOTE — Discharge Instructions (Addendum)
Make sure you are staying well-hydrated water.  This is very important. Take 2 capfuls of MiraLAX daily until having regular bowel movements. Eat a high-fiber diet to help with constipation. Follow-up with your GI doctor for further evaluation and management of your constipation and abdominal pain. Return to the emergency room with any new, worsening, concerning symptoms.

## 2018-08-08 ENCOUNTER — Telehealth: Payer: Self-pay | Admitting: *Deleted

## 2018-08-08 NOTE — Telephone Encounter (Signed)
Patient wanted to speak to Danella Sensing, ANP about her visit to the skin doctor.

## 2018-08-08 NOTE — Telephone Encounter (Signed)
Return [name] call, no answer. Left message to return the call.

## 2018-08-16 DIAGNOSIS — L820 Inflamed seborrheic keratosis: Secondary | ICD-10-CM | POA: Diagnosis not present

## 2018-08-16 DIAGNOSIS — L308 Other specified dermatitis: Secondary | ICD-10-CM | POA: Diagnosis not present

## 2018-08-20 ENCOUNTER — Encounter: Payer: Self-pay | Admitting: Registered Nurse

## 2018-08-20 ENCOUNTER — Encounter: Payer: Medicare HMO | Attending: Physical Medicine and Rehabilitation | Admitting: Registered Nurse

## 2018-08-20 VITALS — BP 144/81 | HR 71 | Ht 66.0 in | Wt 207.0 lb

## 2018-08-20 DIAGNOSIS — M47816 Spondylosis without myelopathy or radiculopathy, lumbar region: Secondary | ICD-10-CM | POA: Diagnosis not present

## 2018-08-20 DIAGNOSIS — G5603 Carpal tunnel syndrome, bilateral upper limbs: Secondary | ICD-10-CM

## 2018-08-20 DIAGNOSIS — Z79899 Other long term (current) drug therapy: Secondary | ICD-10-CM | POA: Insufficient documentation

## 2018-08-20 DIAGNOSIS — G894 Chronic pain syndrome: Secondary | ICD-10-CM | POA: Diagnosis not present

## 2018-08-20 DIAGNOSIS — M47817 Spondylosis without myelopathy or radiculopathy, lumbosacral region: Secondary | ICD-10-CM | POA: Diagnosis not present

## 2018-08-20 DIAGNOSIS — H539 Unspecified visual disturbance: Secondary | ICD-10-CM | POA: Diagnosis not present

## 2018-08-20 DIAGNOSIS — G8929 Other chronic pain: Secondary | ICD-10-CM

## 2018-08-20 DIAGNOSIS — Z5181 Encounter for therapeutic drug level monitoring: Secondary | ICD-10-CM | POA: Diagnosis not present

## 2018-08-20 DIAGNOSIS — M19011 Primary osteoarthritis, right shoulder: Secondary | ICD-10-CM | POA: Diagnosis not present

## 2018-08-20 DIAGNOSIS — M25511 Pain in right shoulder: Secondary | ICD-10-CM | POA: Diagnosis not present

## 2018-08-20 DIAGNOSIS — M546 Pain in thoracic spine: Secondary | ICD-10-CM | POA: Diagnosis not present

## 2018-08-20 DIAGNOSIS — Z79891 Long term (current) use of opiate analgesic: Secondary | ICD-10-CM

## 2018-08-20 DIAGNOSIS — M797 Fibromyalgia: Secondary | ICD-10-CM | POA: Diagnosis not present

## 2018-08-20 MED ORDER — HYDROCODONE-ACETAMINOPHEN 7.5-325 MG PO TABS: 1.0000 | ORAL_TABLET | Freq: Two times a day (BID) | ORAL | 0 refills | Status: DC | PRN

## 2018-08-20 NOTE — Progress Notes (Signed)
Subjective:    Patient ID: [name] [name], female    DOB: 1945/12/12, 73 y.o.   MRN: [id number]  HPI: [name] [name] is a 73 y.o. female who returns for follow up appointment for chronic pain and medication refill. She states her pain is located in her right wrist radiating into her fingers and right arm, neck pain radiating into her right shoulder and lower back pain. Also reports she's experiencing some vision changes and will be scheduling an appointment with her opthalmologist.  She rates her  pain  8. Her current exercise regime is walking and performing stretching exercises.  [name] [name] Morphine equivalent is 15.00  MME. She is also prescribed Clonazepam by Vicenta Aly.We have discussed the black box warning of using opioids and benzodiazepines. I highlighted the dangers of using these drugs together and discussed the adverse events including respiratory suppression, overdose, cognitive impairment and importance of compliance with current regimen. We will continue to monitor and adjust as indicated.  She is being closely monitored and under the care of her psychiatrist Dr. Lillette Boxer   [name] [name] is currently seeing 5 specialist, we will allow her to come every 2 months due to financial hardship, she verbalizes understanding.   Pain Inventory Average Pain 8 Pain Right Now 8 My pain is constant, sharp, burning and aching  In the last 24 hours, has pain interfered with the following? General activity 5 Relation with others 6 Enjoyment of life 8 What TIME of day is your pain at its worst? all Sleep (in general) Fair  Pain is worse with: walking, bending, sitting and standing Pain improves with: rest, heat/ice, medication and injections Relief from Meds: 7  Mobility use a cane ability to climb steps?  yes do you drive?  yes  Function Do you have any goals in this area?  yes  Neuro/Psych bladder control problems bowel control  problems weakness dizziness depression anxiety  Prior Studies Any changes since last visit?  no  Physicians involved in your care Any changes since last visit?  no   Family History  Problem Relation Age of Onset  . Cancer Other        breast -- grandmother  . Cancer Other        lung -- uncles (3)  . Leukemia Sister   . Diabetes Father   . Coronary artery disease Father   . Diabetes Mother    Social History   Socioeconomic History  . Marital status: Married    Spouse name: Not on file  . Number of children: Not on file  . Years of education: Not on file  . Highest education level: Not on file  Occupational History  . Not on file  Social Needs  . Financial resource strain: Not on file  . Food insecurity:    Worry: Not on file    Inability: Not on file  . Transportation needs:    Medical: Not on file    Non-medical: Not on file  Tobacco Use  . Smoking status: Never Smoker  . Smokeless tobacco: Never Used  Substance and Sexual Activity  . Alcohol use: No  . Drug use: No  . Sexual activity: Not on file  Lifestyle  . Physical activity:    Days per week: Not on file    Minutes per session: Not on file  . Stress: Not on file  Relationships  . Social connections:    Talks on phone: Not on file    Gets  together: Not on file    Attends religious service: Not on file    Active member of club or organization: Not on file    Attends meetings of clubs or organizations: Not on file    Relationship status: Not on file  Other Topics Concern  . Not on file  Social History Narrative  . Not on file   Past Surgical History:  Procedure Laterality Date  . CARDIAC CATHETERIZATION  01-17-2003  dr Darnell Level brodie   normal coronary arteries and LVF,  ef 60%  . EXCISION MORTON'S NEUROMA  2002   bilateral feet  . EXCISION OF BREAST BIOPSY Right 1990's   benign  . INTERSTIM IMPLANT PLACEMENT N/A 02/10/2015   Procedure: Barrie Lyme IMPLANT FIRST STAGE;  Surgeon: Bjorn Loser, MD;  Location: [facility];  Service: Urology;  Laterality: N/A;  . INTERSTIM IMPLANT PLACEMENT N/A 02/10/2015   Procedure: Barrie Lyme IMPLANT SECOND STAGE;  Surgeon: Bjorn Loser, MD;  Location: [facility];  Service: Urology;  Laterality: N/A;  . LAPAROSCOPIC CHOLECYSTECTOMY  2000  . NEGATIVE SLEEP STUDY  2000   per pt  . ORIF ANKLE FRACTURE  01/10/2012   Procedure: OPEN REDUCTION INTERNAL FIXATION (ORIF) ANKLE FRACTURE;  Surgeon: Sanjuana Kava, MD;  Location: [facility];  Service: Orthopedics;  Laterality: Left;  . TOTAL THYROIDECTOMY  1985   benign tumor  . TRANSTHORACIC ECHOCARDIOGRAM  03-02-2009   normal echo,  ef 55-60%  . VAGINAL HYSTERECTOMY  1984  . WRIST SURGERY Left 2000   Past Medical History:  Diagnosis Date  . Anxiety   . Cervical radiculopathy   . Chronic back pain    R > L  . Chronic neck pain   . Depression   . Dysphagia    functional --  takes small bites  . Fibromyalgia   . GERD (gastroesophageal reflux disease)   . Hemorrhoids   . History of ankle fracture 01/08/2012   Status post open reduction internal fixation of a left ankle trimalleolar fracture by Dr. Iona Hansen on 01/10/2012.    Marland Kitchen History of benign thyroid tumor   . History of diabetes mellitus, type II    was diet controlled -- per pt pcp she was no longer diabetic  . History of hiatal hernia   . History of TIA (transient ischemic attack)    02-27-2009  no residual  . HTN (hypertension)   . Hyperlipidemia   . Hypothyroidism, postsurgical   . Lumbar radicular pain    R>L legs  . Migraine headache   . Mild intermittent asthma with allergic rhinitis without complication   . PONV (postoperative nausea and vomiting)   . Rotator cuff syndrome of right shoulder   . Urge urinary incontinence    refractory  . Wears glasses    BP (!) 144/81   Pulse 71   Ht 5\' 6"  (1.676 m)   Wt 207 lb (93.9 kg)   SpO2 98%   BMI 33.41 kg/m   Opioid Risk Score:   Fall  Risk Score:  `1  Depression screen PHQ 2/9  Depression screen [facility] 2/9 06/22/2018 05/23/2018 03/19/2018 12/26/2017 04/14/2017 03/21/2017 01/19/2017  Decreased Interest 1 1 1 1 1 3 3   Down, Depressed, Hopeless 1 1 1 1 1 3 3   PHQ - 2 Score 2 2 2 2 2 6 6   Altered sleeping - - - - - - -  Tired, decreased energy - - - - - - -  Change in appetite - - - - - - -  Feeling bad or failure about yourself  - - - - - - -  Trouble concentrating - - - - - - -  Moving slowly or fidgety/restless - - - - - - -  Suicidal thoughts - - - - - - -  PHQ-9 Score - - - - - - -  Some recent data might be hidden  \  Review of Systems  Constitutional: Positive for appetite change, diaphoresis and fever.  HENT: Negative.   Eyes: Negative.   Respiratory: Negative.   Cardiovascular: Negative.   Gastrointestinal: Positive for constipation, diarrhea and nausea.  Endocrine: Negative.   Genitourinary: Positive for difficulty urinating.  Musculoskeletal: Positive for arthralgias, gait problem and myalgias.  Skin: Positive for rash.  Allergic/Immunologic: Negative.   Neurological: Positive for dizziness and weakness.  Hematological: Bruises/bleeds easily.  Psychiatric/Behavioral: Positive for dysphoric mood. The patient is nervous/anxious.   All other systems reviewed and are negative.      Objective:   Physical Exam Vitals signs and nursing note reviewed.  Constitutional:      Appearance: Normal appearance.  Neck:     Musculoskeletal: Normal range of motion and neck supple.     Comments: Cervical Paraspinal Tenderness: C-5-C-6  Cardiovascular:     Rate and Rhythm: Normal rate and regular rhythm.     Pulses: Normal pulses.     Heart sounds: Normal heart sounds.  Pulmonary:     Effort: Pulmonary effort is normal.     Breath sounds: Normal breath sounds.  Musculoskeletal:     Comments: Normal Muscle Bulk and Muscle Testing Reveals:  Upper Extremities: Full ROM and Muscle Strength 5/5 Right AC Joint  Tenderness Wearing Right Wrist Splint  Thoracic Paraspinal Tenderness: T-7-T-9 Lumbar Paraspinal Tenderness: L-3-L-5 Lower Extremities: Full ROM and Muscle Strength 5/5 Arises from Table with ease Narrow Based Gait   Skin:    General: Skin is warm and dry.  Neurological:     Mental Status: She is alert and oriented to person, place, and time.  Psychiatric:        Mood and Affect: Mood normal.        Behavior: Behavior normal.           Assessment & Plan:  1.History of fibromyalgia.Continue currentMedicationRegimen: HEP as Tolerated and Continue Gabapentin.08/20/2018 2. Chronicmidline thoracic back pain/low back pain/ Lumbosacral Spondylosis/ Lumbar Facet Arthropathy: Continue HEP as Tolerated. Continue to Monitor. 08/20/2018 Refilled::Hydrocodone 7.5/325mg  one tablet twice a day as needed for pain # 60.Second script sent for the following month. 07/18/2018 We will continue the opioid monitoring program, this consists of regular clinic visits, examinations, urine drug screen, pill counts as well as use of [place] Controlled Substance Reporting System. Continue Neurontin. 3.Cervicalgia/Cervical Radiculopathy:Continue withmedication regimen withGabapentin: 08/20/2018 4. Anxiety and depression.Continue Effexor andKlonopin Psychiatry Prescribing.08/20/2018 5. Left Ankle OA: Wearing AFO: Orthopedist Following. 08/20/2018 6. Bilateral Chronic knee Pain: No complaints today.Continue HEP Program. Continue to monitor.08/20/2018 7. Bilateral Carpal Tunnel Syndrome: Wearing Right wrist splint, encouraged to purchased wrist splints. Will continue to monitor.  8. Acute Right Shoulder Pain: Continue HEP as Tolerated. Continue to Monitor.  9. Vision Changes: [name] [name] states she is scheduling an appointment with her Ophthalmologist.   20 minutes of face to face patient care time was spent during this visit. All questions were encouraged and answered.  F/U in  21month  .

## 2018-08-21 ENCOUNTER — Other Ambulatory Visit: Payer: Self-pay | Admitting: Registered Nurse

## 2018-08-21 DIAGNOSIS — F418 Other specified anxiety disorders: Secondary | ICD-10-CM

## 2018-08-21 DIAGNOSIS — F411 Generalized anxiety disorder: Secondary | ICD-10-CM | POA: Diagnosis not present

## 2018-08-21 DIAGNOSIS — M797 Fibromyalgia: Secondary | ICD-10-CM

## 2018-08-21 DIAGNOSIS — R69 Illness, unspecified: Secondary | ICD-10-CM | POA: Diagnosis not present

## 2018-08-25 LAB — TOXASSURE SELECT,+ANTIDEPR,UR

## 2018-08-30 ENCOUNTER — Telehealth: Payer: Self-pay | Admitting: *Deleted

## 2018-08-30 NOTE — Telephone Encounter (Signed)
Urine drug screen for this encounter is consistent for prescribed medication 

## 2018-09-08 ENCOUNTER — Other Ambulatory Visit: Payer: Self-pay | Admitting: Physical Medicine & Rehabilitation

## 2018-09-08 DIAGNOSIS — M47816 Spondylosis without myelopathy or radiculopathy, lumbar region: Secondary | ICD-10-CM

## 2018-09-08 DIAGNOSIS — M797 Fibromyalgia: Secondary | ICD-10-CM

## 2018-09-17 ENCOUNTER — Telehealth: Payer: Self-pay | Admitting: *Deleted

## 2018-09-17 NOTE — Telephone Encounter (Signed)
Return Ms. Lissa Merlin call, no answer. Left message to return the call.

## 2018-09-17 NOTE — Telephone Encounter (Signed)
Patient left a message reaching out to [place] asking if she might be able to help.  They are trying to get home assistance from [facility] and are in need of a lettter and were wondering if [name] might be able to help.  Asking for Danella Sensing, ANP to please call.

## 2018-09-20 ENCOUNTER — Other Ambulatory Visit: Payer: Self-pay | Admitting: Registered Nurse

## 2018-09-20 DIAGNOSIS — F418 Other specified anxiety disorders: Secondary | ICD-10-CM

## 2018-09-20 DIAGNOSIS — M797 Fibromyalgia: Secondary | ICD-10-CM

## 2018-10-12 DIAGNOSIS — F5105 Insomnia due to other mental disorder: Secondary | ICD-10-CM | POA: Diagnosis not present

## 2018-10-12 DIAGNOSIS — R69 Illness, unspecified: Secondary | ICD-10-CM | POA: Diagnosis not present

## 2018-10-12 DIAGNOSIS — F411 Generalized anxiety disorder: Secondary | ICD-10-CM | POA: Diagnosis not present

## 2018-10-12 DIAGNOSIS — F4310 Post-traumatic stress disorder, unspecified: Secondary | ICD-10-CM | POA: Diagnosis not present

## 2018-10-15 ENCOUNTER — Encounter: Payer: Medicare HMO | Attending: Physical Medicine and Rehabilitation | Admitting: Registered Nurse

## 2018-10-15 DIAGNOSIS — M19011 Primary osteoarthritis, right shoulder: Secondary | ICD-10-CM | POA: Insufficient documentation

## 2018-10-15 DIAGNOSIS — Z79899 Other long term (current) drug therapy: Secondary | ICD-10-CM | POA: Insufficient documentation

## 2018-10-15 DIAGNOSIS — Z5181 Encounter for therapeutic drug level monitoring: Secondary | ICD-10-CM | POA: Insufficient documentation

## 2018-10-17 ENCOUNTER — Encounter (HOSPITAL_BASED_OUTPATIENT_CLINIC_OR_DEPARTMENT_OTHER): Payer: Medicare HMO | Admitting: Registered Nurse

## 2018-10-17 ENCOUNTER — Encounter: Payer: Self-pay | Admitting: Registered Nurse

## 2018-10-17 ENCOUNTER — Other Ambulatory Visit: Payer: Self-pay

## 2018-10-17 VITALS — BP 140/90 | Ht 66.0 in | Wt 207.0 lb

## 2018-10-17 DIAGNOSIS — Z79891 Long term (current) use of opiate analgesic: Secondary | ICD-10-CM | POA: Diagnosis not present

## 2018-10-17 DIAGNOSIS — M47816 Spondylosis without myelopathy or radiculopathy, lumbar region: Secondary | ICD-10-CM | POA: Diagnosis not present

## 2018-10-17 DIAGNOSIS — G894 Chronic pain syndrome: Secondary | ICD-10-CM | POA: Diagnosis not present

## 2018-10-17 DIAGNOSIS — M797 Fibromyalgia: Secondary | ICD-10-CM

## 2018-10-17 DIAGNOSIS — Z5181 Encounter for therapeutic drug level monitoring: Secondary | ICD-10-CM | POA: Diagnosis not present

## 2018-10-17 DIAGNOSIS — M546 Pain in thoracic spine: Secondary | ICD-10-CM | POA: Diagnosis not present

## 2018-10-17 DIAGNOSIS — M47817 Spondylosis without myelopathy or radiculopathy, lumbosacral region: Secondary | ICD-10-CM | POA: Diagnosis not present

## 2018-10-17 DIAGNOSIS — M7918 Myalgia, other site: Secondary | ICD-10-CM | POA: Diagnosis not present

## 2018-10-17 DIAGNOSIS — M2550 Pain in unspecified joint: Secondary | ICD-10-CM | POA: Diagnosis not present

## 2018-10-17 DIAGNOSIS — M5416 Radiculopathy, lumbar region: Secondary | ICD-10-CM

## 2018-10-17 DIAGNOSIS — G8929 Other chronic pain: Secondary | ICD-10-CM

## 2018-10-17 DIAGNOSIS — M7062 Trochanteric bursitis, left hip: Secondary | ICD-10-CM

## 2018-10-17 MED ORDER — HYDROCODONE-ACETAMINOPHEN 7.5-325 MG PO TABS: 1.0000 | ORAL_TABLET | Freq: Three times a day (TID) | ORAL | 0 refills | Status: DC | PRN

## 2018-10-17 NOTE — Progress Notes (Signed)
Subjective:    Patient ID: [name] [name], female    DOB: 21-Oct-1945, 73 y.o.   MRN: [id number]  HPI: [name] [name] is a 73 y.o. female her appointment was changed, due to national recommendations of social distancing due to [place] 19, an audio/video telehealth visit is felt to be most appropriate for this patient at this time.  See Chart message from today for the patient's consent to telehealth from [place].     She states her pain is located in her bilateral wrist, right hand, lower back radiating into her left lower extremity, left hip pain and left foot pain and reports joint pain all over. Also states she is experiencing increase intensity and frequency of lower back pain, left hip and left pain, at times she needs to take her hydrocodone three times a day. We will increase her tablets due to the above, she verbalizes understanding.  She rates her pain 4. Her current exercise regime is walking and performing stretching exercises.  Ms. Mizzell Morphine equivalent is 15.00 MME.  She  is also prescribed Clonazepam  by Dr. Jessy Oto . We have discussed the black box warning of using opioids and benzodiazepines. I highlighted the dangers of using these drugs together and discussed the adverse events including respiratory suppression, overdose, cognitive impairment and importance of compliance with current regimen. We will continue to monitor and adjust as indicated.  she is being closely monitored and under the care of her psychiatrist Dr. Jessy Oto.    Last UDS was Performed on 08/20/2018, it was consistent.   [name] CMA asked the Health History Questions. This provider and [name] CMA verified we were speaking with the correct person using two identifiers.   Pain Inventory Average Pain 7 Pain Right Now 4 My pain is constant and aching  In the last 24 hours, has pain interfered with the following? General activity 4 Relation with others 4 Enjoyment of life  4 What TIME of day is your pain at its worst? night Sleep (in general) Fair  Pain is worse with: standing and at night Pain improves with: medication Relief from Meds: 7  Mobility walk with assistance use a cane how many minutes can you walk? 20 ability to climb steps?  yes do you drive?  no  Function disabled: date disabled na I need assistance with the following:  household duties and shopping  Neuro/Psych bladder control problems weakness numbness trouble walking depression anxiety  Prior Studies Any changes since last visit?  no  Physicians involved in your care Any changes since last visit?  no Psychiatrist Dr. Jessy Oto   Family History  Problem Relation Age of Onset  . Cancer Other        breast -- grandmother  . Cancer Other        lung -- uncles (3)  . Leukemia Sister   . Diabetes Father   . Coronary artery disease Father   . Diabetes Mother    Social History   Socioeconomic History  . Marital status: Married    Spouse name: Not on file  . Number of children: Not on file  . Years of education: Not on file  . Highest education level: Not on file  Occupational History  . Not on file  Social Needs  . Financial resource strain: Not on file  . Food insecurity:    Worry: Not on file    Inability: Not on file  . Transportation needs:  Medical: Not on file    Non-medical: Not on file  Tobacco Use  . Smoking status: Never Smoker  . Smokeless tobacco: Never Used  Substance and Sexual Activity  . Alcohol use: No  . Drug use: No  . Sexual activity: Not on file  Lifestyle  . Physical activity:    Days per week: Not on file    Minutes per session: Not on file  . Stress: Not on file  Relationships  . Social connections:    Talks on phone: Not on file    Gets together: Not on file    Attends religious service: Not on file    Active member of club or organization: Not on file    Attends meetings of clubs or organizations: Not on file     Relationship status: Not on file  Other Topics Concern  . Not on file  Social History Narrative  . Not on file   Past Surgical History:  Procedure Laterality Date  . CARDIAC CATHETERIZATION  01-17-2003  dr Darnell Level brodie   normal coronary arteries and LVF,  ef 60%  . EXCISION MORTON'S NEUROMA  2002   bilateral feet  . EXCISION OF BREAST BIOPSY Right 1990's   benign  . INTERSTIM IMPLANT PLACEMENT N/A 02/10/2015   Procedure: Barrie Lyme IMPLANT FIRST STAGE;  Surgeon: Bjorn Loser, MD;  Location: [facility];  Service: Urology;  Laterality: N/A;  . INTERSTIM IMPLANT PLACEMENT N/A 02/10/2015   Procedure: Barrie Lyme IMPLANT SECOND STAGE;  Surgeon: Bjorn Loser, MD;  Location: [facility];  Service: Urology;  Laterality: N/A;  . LAPAROSCOPIC CHOLECYSTECTOMY  2000  . NEGATIVE SLEEP STUDY  2000   per pt  . ORIF ANKLE FRACTURE  01/10/2012   Procedure: OPEN REDUCTION INTERNAL FIXATION (ORIF) ANKLE FRACTURE;  Surgeon: Sanjuana Kava, MD;  Location: [facility];  Service: Orthopedics;  Laterality: Left;  . TOTAL THYROIDECTOMY  1985   benign tumor  . TRANSTHORACIC ECHOCARDIOGRAM  03-02-2009   normal echo,  ef 55-60%  . VAGINAL HYSTERECTOMY  1984  . WRIST SURGERY Left 2000   Past Medical History:  Diagnosis Date  . Anxiety   . Cervical radiculopathy   . Chronic back pain    R > L  . Chronic neck pain   . Depression   . Dysphagia    functional --  takes small bites  . Fibromyalgia   . GERD (gastroesophageal reflux disease)   . Hemorrhoids   . History of ankle fracture 01/08/2012   Status post open reduction internal fixation of a left ankle trimalleolar fracture by Dr. Iona Hansen on 01/10/2012.    Marland Kitchen History of benign thyroid tumor   . History of diabetes mellitus, type II    was diet controlled -- per pt pcp she was no longer diabetic  . History of hiatal hernia   . History of TIA (transient ischemic attack)    02-27-2009  no residual  . HTN (hypertension)    . Hyperlipidemia   . Hypothyroidism, postsurgical   . Lumbar radicular pain    R>L legs  . Migraine headache   . Mild intermittent asthma with allergic rhinitis without complication   . PONV (postoperative nausea and vomiting)   . Rotator cuff syndrome of right shoulder   . Urge urinary incontinence    refractory  . Wears glasses    BP 140/90 Comment: pt reported, virtual call visit  Ht 5\' 6"  (1.676 m) Comment: pt reported, virtual call visit  Wt 207 lb (93.9 kg) Comment: pt reported, virtual call visit  BMI 33.41 kg/m   Opioid Risk Score:   Fall Risk Score:  `1  Depression screen PHQ 2/9  Depression screen [facility] 2/9 10/17/2018 06/22/2018 05/23/2018 03/19/2018 12/26/2017 04/14/2017 03/21/2017  Decreased Interest 1 1 1 1 1 1 3   Down, Depressed, Hopeless 1 1 1 1 1 1 3   PHQ - 2 Score 2 2 2 2 2 2 6   Altered sleeping - - - - - - -  Tired, decreased energy - - - - - - -  Change in appetite - - - - - - -  Feeling bad or failure about yourself  - - - - - - -  Trouble concentrating - - - - - - -  Moving slowly or fidgety/restless - - - - - - -  Suicidal thoughts - - - - - - -  PHQ-9 Score - - - - - - -  Some recent data might be hidden    Review of Systems  Constitutional: Positive for chills, diaphoresis and fever.  HENT: Positive for sneezing.   Eyes: Negative.   Respiratory: Positive for cough, shortness of breath and wheezing.   Cardiovascular: Negative.   Gastrointestinal: Positive for constipation.  Endocrine: Negative.   Genitourinary: Negative.   Musculoskeletal: Positive for back pain and gait problem.  Skin: Negative.   Allergic/Immunologic: Negative.   Neurological: Positive for weakness and numbness.  Hematological: Negative.   Psychiatric/Behavioral: Positive for dysphoric mood. The patient is nervous/anxious.   All other systems reviewed and are negative.      Objective:   Physical Exam Vitals signs and nursing note reviewed.  Musculoskeletal:     Comments:  No Physical Exam: Virtual Visit  Neurological:     Mental Status: She is oriented to person, place, and time.           Assessment & Plan:  1.History of fibromyalgia.Continue currentMedicationRegimen: HEP as Tolerated and Continue Gabapentin.10/17/2018 2. Chronicmidline thoracic back pain/low back pain/ Lumbosacral Spondylosis/ Lumbar Facet Arthropathy: Continue HEP as Tolerated. Continue to Monitor.10/17/2018 Refilled::Increased: Hydrocodone 7.5/325mg  one tablet twice a day as needed for pain # 75. We will continue the opioid monitoring program, this consists of regular clinic visits, examinations, urine drug screen, pill counts as well as use of [place] Controlled Substance Reporting System. Continue Neurontin. 3.Cervicalgia/Cervical Radiculopathy:Continue withmedication regimen withGabapentin: 10/17/2018 4. Anxiety and depression.Continue Effexor andKlonopin Psychiatry Prescribing.10/17/2018 5. Left Ankle OA: Wearing AFO: Orthopedist Following.10/17/2018 6. Bilateral Chronic knee Pain:No complaints today.Continue HEP Program. Continue to monitor.10/17/2018 7. Bilateral Carpal Tunnel Syndrome: No complaints today. Continue wearing splints. Continue to monitor. 10/17/2018 8. Polyarthralgia: Continue to Alternate Heat and Ice Therapy.   Continue with current medication regime. Continue to Monitor.   F/U in 49month  Telephone Visit Location of patient: In her Home Location of provider: Office Established patient  Time spent on call: 15 minutes

## 2018-10-19 ENCOUNTER — Other Ambulatory Visit: Payer: Self-pay | Admitting: Registered Nurse

## 2018-10-19 DIAGNOSIS — M797 Fibromyalgia: Secondary | ICD-10-CM

## 2018-10-19 DIAGNOSIS — F418 Other specified anxiety disorders: Secondary | ICD-10-CM

## 2018-10-21 ENCOUNTER — Encounter: Payer: Self-pay | Admitting: Registered Nurse

## 2018-10-25 DIAGNOSIS — I10 Essential (primary) hypertension: Secondary | ICD-10-CM | POA: Diagnosis not present

## 2018-10-25 DIAGNOSIS — R7301 Impaired fasting glucose: Secondary | ICD-10-CM | POA: Diagnosis not present

## 2018-10-25 DIAGNOSIS — E782 Mixed hyperlipidemia: Secondary | ICD-10-CM | POA: Diagnosis not present

## 2018-10-25 DIAGNOSIS — E039 Hypothyroidism, unspecified: Secondary | ICD-10-CM | POA: Diagnosis not present

## 2018-10-26 DIAGNOSIS — F411 Generalized anxiety disorder: Secondary | ICD-10-CM | POA: Diagnosis not present

## 2018-10-26 DIAGNOSIS — R69 Illness, unspecified: Secondary | ICD-10-CM | POA: Diagnosis not present

## 2018-11-15 DIAGNOSIS — F411 Generalized anxiety disorder: Secondary | ICD-10-CM | POA: Diagnosis not present

## 2018-11-15 DIAGNOSIS — R69 Illness, unspecified: Secondary | ICD-10-CM | POA: Diagnosis not present

## 2018-11-19 ENCOUNTER — Ambulatory Visit: Payer: Medicare HMO | Admitting: Registered Nurse

## 2018-11-20 ENCOUNTER — Encounter: Payer: Self-pay | Admitting: Registered Nurse

## 2018-11-20 ENCOUNTER — Encounter: Payer: Medicare HMO | Attending: Physical Medicine and Rehabilitation | Admitting: Registered Nurse

## 2018-11-20 ENCOUNTER — Other Ambulatory Visit: Payer: Self-pay

## 2018-11-20 VITALS — BP 140/84 | HR 64 | Temp 99.4°F | Ht 66.0 in | Wt 208.0 lb

## 2018-11-20 DIAGNOSIS — Z79891 Long term (current) use of opiate analgesic: Secondary | ICD-10-CM

## 2018-11-20 DIAGNOSIS — Z79899 Other long term (current) drug therapy: Secondary | ICD-10-CM | POA: Insufficient documentation

## 2018-11-20 DIAGNOSIS — M47816 Spondylosis without myelopathy or radiculopathy, lumbar region: Secondary | ICD-10-CM

## 2018-11-20 DIAGNOSIS — M47817 Spondylosis without myelopathy or radiculopathy, lumbosacral region: Secondary | ICD-10-CM | POA: Diagnosis not present

## 2018-11-20 DIAGNOSIS — G894 Chronic pain syndrome: Secondary | ICD-10-CM

## 2018-11-20 DIAGNOSIS — M19011 Primary osteoarthritis, right shoulder: Secondary | ICD-10-CM | POA: Insufficient documentation

## 2018-11-20 DIAGNOSIS — M797 Fibromyalgia: Secondary | ICD-10-CM

## 2018-11-20 DIAGNOSIS — M5416 Radiculopathy, lumbar region: Secondary | ICD-10-CM | POA: Diagnosis not present

## 2018-11-20 DIAGNOSIS — Z5181 Encounter for therapeutic drug level monitoring: Secondary | ICD-10-CM | POA: Diagnosis not present

## 2018-11-20 DIAGNOSIS — M7918 Myalgia, other site: Secondary | ICD-10-CM | POA: Diagnosis not present

## 2018-11-20 DIAGNOSIS — M2550 Pain in unspecified joint: Secondary | ICD-10-CM | POA: Diagnosis not present

## 2018-11-20 MED ORDER — HYDROCODONE-ACETAMINOPHEN 7.5-325 MG PO TABS: 1.0000 | ORAL_TABLET | Freq: Three times a day (TID) | ORAL | 0 refills | Status: DC | PRN

## 2018-11-20 NOTE — Progress Notes (Signed)
Subjective:    Patient ID: [name] [name], female    DOB: 1945/12/05, 73 y.o.   MRN: [id number]  HPI: [name] [name] is a 73 y.o. female her appointment was changed, due to national recommendations of social distancing due to [place] 19, an audio/video telehealth visit is felt to be most appropriate for this patient at this time.  See Chart message from today for the patient's consent to telehealth from [place].     She states her pain is located in her lower back radiating into her left lower extremity and joint pain in her bilateral wrist. She rates her pain 5. Her current exercise regime is walking and performing stretching exercises.  [name] Morphine equivalent is 22.50  MME. She is also prescribed Clonazepam  by Dr. Jessy Oto.We have discussed the black box warning of using opioids and benzodiazepines. I highlighted the dangers of using these drugs together and discussed the adverse events including respiratory suppression, overdose, cognitive impairment and importance of compliance with current regimen. We will continue to monitor and adjust as indicated.  She is being closely monitored and under the care of her psychiatrist Dr. Jessy Oto.     Last UDS was Performed on 08/20/2018, it was consistent.   Marland Mcalpine CMA asked the Health and History Questions. This provider and Marland Mcalpine  verified we were speaking with the correct person using two identifiers.   Pain Inventory Average Pain 6 Pain Right Now 5 My pain is constant, stabbing and aching  In the last 24 hours, has pain interfered with the following? General activity 6 Relation with others 6 Enjoyment of life 6 What TIME of day is your pain at its worst? evening Sleep (in general) Fair  Pain is worse with: walking, bending, standing and some activites Pain improves with: rest, therapy/exercise and medication Relief from Meds: 7  Mobility use a cane ability to climb steps?  yes do you  drive?  no  Function retired  Neuro/Psych bladder control problems weakness numbness tremor tingling trouble walking spasms depression anxiety  Prior Studies Any changes since last visit?  no  Physicians involved in your care Any changes since last visit?  no   Family History  Problem Relation Age of Onset  . Cancer Other        breast -- grandmother  . Cancer Other        lung -- uncles (3)  . Leukemia Sister   . Diabetes Father   . Coronary artery disease Father   . Diabetes Mother    Social History   Socioeconomic History  . Marital status: Married    Spouse name: Not on file  . Number of children: Not on file  . Years of education: Not on file  . Highest education level: Not on file  Occupational History  . Not on file  Social Needs  . Financial resource strain: Not on file  . Food insecurity:    Worry: Not on file    Inability: Not on file  . Transportation needs:    Medical: Not on file    Non-medical: Not on file  Tobacco Use  . Smoking status: Never Smoker  . Smokeless tobacco: Never Used  Substance and Sexual Activity  . Alcohol use: No  . Drug use: No  . Sexual activity: Not on file  Lifestyle  . Physical activity:    Days per week: Not on file    Minutes per session: Not  on file  . Stress: Not on file  Relationships  . Social connections:    Talks on phone: Not on file    Gets together: Not on file    Attends religious service: Not on file    Active member of club or organization: Not on file    Attends meetings of clubs or organizations: Not on file    Relationship status: Not on file  Other Topics Concern  . Not on file  Social History Narrative  . Not on file   Past Surgical History:  Procedure Laterality Date  . CARDIAC CATHETERIZATION  01-17-2003  dr Darnell Level brodie   normal coronary arteries and LVF,  ef 60%  . EXCISION MORTON'S NEUROMA  2002   bilateral feet  . EXCISION OF BREAST BIOPSY Right 1990's   benign  .  INTERSTIM IMPLANT PLACEMENT N/A 02/10/2015   Procedure: Barrie Lyme IMPLANT FIRST STAGE;  Surgeon: Bjorn Loser, MD;  Location: [facility];  Service: Urology;  Laterality: N/A;  . INTERSTIM IMPLANT PLACEMENT N/A 02/10/2015   Procedure: Barrie Lyme IMPLANT SECOND STAGE;  Surgeon: Bjorn Loser, MD;  Location: [facility];  Service: Urology;  Laterality: N/A;  . LAPAROSCOPIC CHOLECYSTECTOMY  2000  . NEGATIVE SLEEP STUDY  2000   per pt  . ORIF ANKLE FRACTURE  01/10/2012   Procedure: OPEN REDUCTION INTERNAL FIXATION (ORIF) ANKLE FRACTURE;  Surgeon: Sanjuana Kava, MD;  Location: [facility];  Service: Orthopedics;  Laterality: Left;  . TOTAL THYROIDECTOMY  1985   benign tumor  . TRANSTHORACIC ECHOCARDIOGRAM  03-02-2009   normal echo,  ef 55-60%  . VAGINAL HYSTERECTOMY  1984  . WRIST SURGERY Left 2000   Past Medical History:  Diagnosis Date  . Anxiety   . Cervical radiculopathy   . Chronic back pain    R > L  . Chronic neck pain   . Depression   . Dysphagia    functional --  takes small bites  . Fibromyalgia   . GERD (gastroesophageal reflux disease)   . Hemorrhoids   . History of ankle fracture 01/08/2012   Status post open reduction internal fixation of a left ankle trimalleolar fracture by Dr. Iona Hansen on 01/10/2012.    Marland Kitchen History of benign thyroid tumor   . History of diabetes mellitus, type II    was diet controlled -- per pt pcp she was no longer diabetic  . History of hiatal hernia   . History of TIA (transient ischemic attack)    02-27-2009  no residual  . HTN (hypertension)   . Hyperlipidemia   . Hypothyroidism, postsurgical   . Lumbar radicular pain    R>L legs  . Migraine headache   . Mild intermittent asthma with allergic rhinitis without complication   . PONV (postoperative nausea and vomiting)   . Rotator cuff syndrome of right shoulder   . Urge urinary incontinence    refractory  . Wears glasses    There were no vitals taken for  this visit.  Opioid Risk Score:   Fall Risk Score:  `1  Depression screen PHQ 2/9  Depression screen [facility] 2/9 10/17/2018 06/22/2018 05/23/2018 03/19/2018 12/26/2017 04/14/2017 03/21/2017  Decreased Interest 1 1 1 1 1 1 3   Down, Depressed, Hopeless 1 1 1 1 1 1 3   PHQ - 2 Score 2 2 2 2 2 2 6   Altered sleeping - - - - - - -  Tired, decreased energy - - - - - - -  Change in appetite - - - - - - -  Feeling bad or failure about yourself  - - - - - - -  Trouble concentrating - - - - - - -  Moving slowly or fidgety/restless - - - - - - -  Suicidal thoughts - - - - - - -  PHQ-9 Score - - - - - - -  Some recent data might be hidden     Review of Systems  Constitutional: Negative.   HENT: Positive for congestion and rhinorrhea.   Eyes: Negative.   Respiratory: Positive for cough.   Cardiovascular: Negative.   Gastrointestinal: Negative.   Endocrine: Negative.   Genitourinary: Negative.   Musculoskeletal: Positive for arthralgias, back pain, gait problem and myalgias.  Skin: Negative.   Allergic/Immunologic: Negative.   Neurological: Positive for weakness and numbness.  Hematological: Negative.   Psychiatric/Behavioral: Positive for dysphoric mood. The patient is nervous/anxious.   All other systems reviewed and are negative.      Objective:   Physical Exam Vitals signs and nursing note reviewed.  Musculoskeletal:     Comments: No Physical Exam Performed: Virtual visit  Neurological:     Mental Status: She is oriented to person, place, and time.           Assessment & Plan:  1.History of fibromyalgia.Continue currentMedicationRegimen: HEP as Tolerated and Continue Gabapentin.11/20/2018 2. Chronicmidline thoracic back pain/low back pain/ Lumbosacral Spondylosis/ Lumbar Facet Arthropathy: Continue HEP as Tolerated. Continue to Monitor.11/20/2018 Refilled: Hydrocodone 7.5/325mg  one tablet twice a day as needed for pain # 75. We will continue the opioid monitoring  program, this consists of regular clinic visits, examinations, urine drug screen, pill counts as well as use of [place] Controlled Substance Reporting System. Continue Neurontin. 3.Cervicalgia/Cervical Radiculopathy:Continue withmedication regimen withGabapentin: 11/20/2018 4. Anxiety and depression.Continue Effexor andKlonopin Psychiatry Prescribing.11/20/2018 5. Left Ankle OA: Orthopedist Following.11/20/2018 6. Bilateral Chronic knee Pain:No complaints today.Continue HEP Program. Continue to monitor.11/20/2018 7. Bilateral Carpal Tunnel Syndrome: No complaints today. Continue wearing splints. Continue to monitor. 11/20/2018 8. Polyarthralgia: Continue to Alternate Heat and Ice Therapy.  Continue with current medication regime. Continue to Monitor. 11/20/2018  F/U in 35month Telephone Call  Location of patient: In her Home Location of provider: Office Established patient Time spent on call: 10 Minutes

## 2018-11-20 NOTE — Progress Notes (Deleted)
Subjective:    Patient ID: [name] [name], female    DOB: 03-06-1946, 73 y.o.   MRN: [id number]  HPI  Pain Inventory Average Pain {NUMBERS; 0-10:5044} Pain Right Now {NUMBERS; 0-10:5044} My pain is {PAIN DESCRIPTION:21022940}  In the last 24 hours, has pain interfered with the following? General activity {NUMBERS; 0-10:5044} Relation with others {NUMBERS; 0-10:5044} Enjoyment of life {NUMBERS; 0-10:5044} What TIME of day is your pain at its worst? {TIME OF GEX:52841324} Sleep (in general) {BHH GOOD/FAIR/POOR:22877}  Pain is worse with: {ACTIVITIES:21022942} Pain improves with: {PAIN IMPROVES MWNU:27253664} Relief from Meds: {NUMBERS; 0-10:5044}  Mobility {MOBILITY QIH:47425956}  Function {FUNCTION:21022946}  Neuro/Psych {NEURO/PSYCH:21022948}  Prior Studies {CPRM PRIOR STUDIES:21022953}  Physicians involved in your care {CPRM PHYSICIANS INVOLVED IN YOUR CARE:21022954}   Family History  Problem Relation Age of Onset  . Cancer Other        breast -- grandmother  . Cancer Other        lung -- uncles (3)  . Leukemia Sister   . Diabetes Father   . Coronary artery disease Father   . Diabetes Mother    Social History   Socioeconomic History  . Marital status: Married    Spouse name: Not on file  . Number of children: Not on file  . Years of education: Not on file  . Highest education level: Not on file  Occupational History  . Not on file  Social Needs  . Financial resource strain: Not on file  . Food insecurity:    Worry: Not on file    Inability: Not on file  . Transportation needs:    Medical: Not on file    Non-medical: Not on file  Tobacco Use  . Smoking status: Never Smoker  . Smokeless tobacco: Never Used  Substance and Sexual Activity  . Alcohol use: No  . Drug use: No  . Sexual activity: Not on file  Lifestyle  . Physical activity:    Days per week: Not on file    Minutes per session: Not on file  . Stress: Not on file  Relationships   . Social connections:    Talks on phone: Not on file    Gets together: Not on file    Attends religious service: Not on file    Active member of club or organization: Not on file    Attends meetings of clubs or organizations: Not on file    Relationship status: Not on file  Other Topics Concern  . Not on file  Social History Narrative  . Not on file   Past Surgical History:  Procedure Laterality Date  . CARDIAC CATHETERIZATION  01-17-2003  dr Darnell Level brodie   normal coronary arteries and LVF,  ef 60%  . EXCISION MORTON'S NEUROMA  2002   bilateral feet  . EXCISION OF BREAST BIOPSY Right 1990's   benign  . INTERSTIM IMPLANT PLACEMENT N/A 02/10/2015   Procedure: Barrie Lyme IMPLANT FIRST STAGE;  Surgeon: Bjorn Loser, MD;  Location: [facility];  Service: Urology;  Laterality: N/A;  . INTERSTIM IMPLANT PLACEMENT N/A 02/10/2015   Procedure: Barrie Lyme IMPLANT SECOND STAGE;  Surgeon: Bjorn Loser, MD;  Location: [facility];  Service: Urology;  Laterality: N/A;  . LAPAROSCOPIC CHOLECYSTECTOMY  2000  . NEGATIVE SLEEP STUDY  2000   per pt  . ORIF ANKLE FRACTURE  01/10/2012   Procedure: OPEN REDUCTION INTERNAL FIXATION (ORIF) ANKLE FRACTURE;  Surgeon: Sanjuana Kava, MD;  Location: [facility];  Service: Orthopedics;  Laterality: Left;  . TOTAL THYROIDECTOMY  1985   benign tumor  . TRANSTHORACIC ECHOCARDIOGRAM  03-02-2009   normal echo,  ef 55-60%  . VAGINAL HYSTERECTOMY  1984  . WRIST SURGERY Left 2000   Past Medical History:  Diagnosis Date  . Anxiety   . Cervical radiculopathy   . Chronic back pain    R > L  . Chronic neck pain   . Depression   . Dysphagia    functional --  takes small bites  . Fibromyalgia   . GERD (gastroesophageal reflux disease)   . Hemorrhoids   . History of ankle fracture 01/08/2012   Status post open reduction internal fixation of a left ankle trimalleolar fracture by Dr. Iona Hansen on 01/10/2012.    Marland Kitchen History of benign  thyroid tumor   . History of diabetes mellitus, type II    was diet controlled -- per pt pcp she was no longer diabetic  . History of hiatal hernia   . History of TIA (transient ischemic attack)    02-27-2009  no residual  . HTN (hypertension)   . Hyperlipidemia   . Hypothyroidism, postsurgical   . Lumbar radicular pain    R>L legs  . Migraine headache   . Mild intermittent asthma with allergic rhinitis without complication   . PONV (postoperative nausea and vomiting)   . Rotator cuff syndrome of right shoulder   . Urge urinary incontinence    refractory  . Wears glasses    There were no vitals taken for this visit.  Opioid Risk Score:   Fall Risk Score:  `1  Depression screen PHQ 2/9  Depression screen [facility] 2/9 10/17/2018 06/22/2018 05/23/2018 03/19/2018 12/26/2017 04/14/2017 03/21/2017  Decreased Interest 1 1 1 1 1 1 3   Down, Depressed, Hopeless 1 1 1 1 1 1 3   PHQ - 2 Score 2 2 2 2 2 2 6   Altered sleeping - - - - - - -  Tired, decreased energy - - - - - - -  Change in appetite - - - - - - -  Feeling bad or failure about yourself  - - - - - - -  Trouble concentrating - - - - - - -  Moving slowly or fidgety/restless - - - - - - -  Suicidal thoughts - - - - - - -  PHQ-9 Score - - - - - - -  Some recent data might be hidden     Review of Systems     Objective:   Physical Exam        Assessment & Plan:

## 2018-11-22 DIAGNOSIS — N3941 Urge incontinence: Secondary | ICD-10-CM | POA: Diagnosis not present

## 2018-11-22 DIAGNOSIS — R358 Other polyuria: Secondary | ICD-10-CM | POA: Diagnosis not present

## 2018-11-22 DIAGNOSIS — R339 Retention of urine, unspecified: Secondary | ICD-10-CM | POA: Diagnosis not present

## 2018-11-22 DIAGNOSIS — R300 Dysuria: Secondary | ICD-10-CM | POA: Diagnosis not present

## 2018-12-06 DIAGNOSIS — N3011 Interstitial cystitis (chronic) with hematuria: Secondary | ICD-10-CM | POA: Diagnosis not present

## 2018-12-06 DIAGNOSIS — N3941 Urge incontinence: Secondary | ICD-10-CM | POA: Diagnosis not present

## 2018-12-06 DIAGNOSIS — R3129 Other microscopic hematuria: Secondary | ICD-10-CM | POA: Diagnosis not present

## 2018-12-07 DIAGNOSIS — E039 Hypothyroidism, unspecified: Secondary | ICD-10-CM | POA: Diagnosis not present

## 2018-12-07 DIAGNOSIS — E782 Mixed hyperlipidemia: Secondary | ICD-10-CM | POA: Diagnosis not present

## 2018-12-07 DIAGNOSIS — I10 Essential (primary) hypertension: Secondary | ICD-10-CM | POA: Diagnosis not present

## 2018-12-07 DIAGNOSIS — L249 Irritant contact dermatitis, unspecified cause: Secondary | ICD-10-CM | POA: Diagnosis not present

## 2018-12-07 DIAGNOSIS — R7301 Impaired fasting glucose: Secondary | ICD-10-CM | POA: Diagnosis not present

## 2018-12-14 DIAGNOSIS — R69 Illness, unspecified: Secondary | ICD-10-CM | POA: Diagnosis not present

## 2018-12-14 DIAGNOSIS — F5105 Insomnia due to other mental disorder: Secondary | ICD-10-CM | POA: Diagnosis not present

## 2018-12-14 DIAGNOSIS — F4310 Post-traumatic stress disorder, unspecified: Secondary | ICD-10-CM | POA: Diagnosis not present

## 2018-12-14 DIAGNOSIS — F411 Generalized anxiety disorder: Secondary | ICD-10-CM | POA: Diagnosis not present

## 2018-12-24 ENCOUNTER — Encounter: Payer: Medicare HMO | Attending: Physical Medicine and Rehabilitation | Admitting: Registered Nurse

## 2018-12-24 ENCOUNTER — Other Ambulatory Visit: Payer: Self-pay

## 2018-12-24 ENCOUNTER — Encounter: Payer: Self-pay | Admitting: Registered Nurse

## 2018-12-24 VITALS — BP 146/86 | HR 66 | Temp 98.6°F | Resp 12 | Ht 66.5 in | Wt 211.0 lb

## 2018-12-24 DIAGNOSIS — Z79891 Long term (current) use of opiate analgesic: Secondary | ICD-10-CM | POA: Diagnosis not present

## 2018-12-24 DIAGNOSIS — M797 Fibromyalgia: Secondary | ICD-10-CM

## 2018-12-24 DIAGNOSIS — M2550 Pain in unspecified joint: Secondary | ICD-10-CM

## 2018-12-24 DIAGNOSIS — Z79899 Other long term (current) drug therapy: Secondary | ICD-10-CM | POA: Diagnosis not present

## 2018-12-24 DIAGNOSIS — M19011 Primary osteoarthritis, right shoulder: Secondary | ICD-10-CM | POA: Diagnosis not present

## 2018-12-24 DIAGNOSIS — M5416 Radiculopathy, lumbar region: Secondary | ICD-10-CM

## 2018-12-24 DIAGNOSIS — M47817 Spondylosis without myelopathy or radiculopathy, lumbosacral region: Secondary | ICD-10-CM

## 2018-12-24 DIAGNOSIS — G894 Chronic pain syndrome: Secondary | ICD-10-CM | POA: Diagnosis not present

## 2018-12-24 DIAGNOSIS — M7918 Myalgia, other site: Secondary | ICD-10-CM | POA: Diagnosis not present

## 2018-12-24 DIAGNOSIS — Z5181 Encounter for therapeutic drug level monitoring: Secondary | ICD-10-CM | POA: Diagnosis not present

## 2018-12-24 DIAGNOSIS — M47816 Spondylosis without myelopathy or radiculopathy, lumbar region: Secondary | ICD-10-CM | POA: Diagnosis not present

## 2018-12-24 DIAGNOSIS — L308 Other specified dermatitis: Secondary | ICD-10-CM | POA: Diagnosis not present

## 2018-12-24 NOTE — Progress Notes (Signed)
Subjective:    Patient ID: [name] [name], female    DOB: 07/31/1945, 73 y.o.   MRN: [id number]  HPI: [name] [name] is a 73 y.o. female who returns for follow up appointment for chronic pain and medication refill. She states her pain is located in her lower back radiating into her left lower extremity. She rates her  Pain 4. Her current exercise regime is walking and performing stretching exercises.  [name] [name] Morphine equivalent is 22.50  MME. She  is also prescribed Clonazepam by Dr. Jessy Oto. We have discussed the black box warning of using opioids and benzodiazepines. I highlighted the dangers of using these drugs together and discussed the adverse events including respiratory suppression, overdose, cognitive impairment and importance of compliance with current regimen. We will continue to monitor and adjust as indicated.  She is being closely monitored and under the care of her psychiatrist Dr. Jessy Oto. Marland Kitchen  Today, [name] [name] reports she has misplaced her Hydrocodone in her home, she was instructed to look for Hydrocodone bottle and call office. Also states she has abou 15- 20 pills left. She verbalizes understanding.    Pain Inventory Average Pain 5 Pain Right Now 4 My pain is constant, burning and stabbing  In the last 24 hours, has pain interfered with the following? General activity 7 Relation with others 3 Enjoyment of life 8 What TIME of day is your pain at its worst? daytime Sleep (in general)   Pain is worse with: some activites Pain improves with: heat/ice, exercise,rest Relief from Meds: 5  Mobility walk with assistance use a walker  Function Do you have any goals in this area?  yes  Neuro/Psych Numbness, tingling  Prior Studies n/a  Physicians involved in your care n/a   Family History  Problem Relation Age of Onset  . Cancer Other        breast -- grandmother  . Cancer Other        lung -- uncles (3)  . Leukemia Sister   . Diabetes Father   . Coronary  artery disease Father   . Diabetes Mother    Social History   Socioeconomic History  . Marital status: Married    Spouse name: Not on file  . Number of children: Not on file  . Years of education: Not on file  . Highest education level: Not on file  Occupational History  . Not on file  Social Needs  . Financial resource strain: Not on file  . Food insecurity    Worry: Not on file    Inability: Not on file  . Transportation needs    Medical: Not on file    Non-medical: Not on file  Tobacco Use  . Smoking status: Never Smoker  . Smokeless tobacco: Never Used  Substance and Sexual Activity  . Alcohol use: No  . Drug use: No  . Sexual activity: Not on file  Lifestyle  . Physical activity    Days per week: Not on file    Minutes per session: Not on file  . Stress: Not on file  Relationships  . Social Herbalist on phone: Not on file    Gets together: Not on file    Attends religious service: Not on file    Active member of club or organization: Not on file    Attends meetings of clubs or organizations: Not on file    Relationship status: Not on file  Other Topics Concern  .  Not on file  Social History Narrative  . Not on file   Past Surgical History:  Procedure Laterality Date  . CARDIAC CATHETERIZATION  01-17-2003  dr Darnell Level brodie   normal coronary arteries and LVF,  ef 60%  . EXCISION MORTON'S NEUROMA  2002   bilateral feet  . EXCISION OF BREAST BIOPSY Right 1990's   benign  . INTERSTIM IMPLANT PLACEMENT N/A 02/10/2015   Procedure: Barrie Lyme IMPLANT FIRST STAGE;  Surgeon: Bjorn Loser, MD;  Location: [facility];  Service: Urology;  Laterality: N/A;  . INTERSTIM IMPLANT PLACEMENT N/A 02/10/2015   Procedure: Barrie Lyme IMPLANT SECOND STAGE;  Surgeon: Bjorn Loser, MD;  Location: [facility];  Service: Urology;  Laterality: N/A;  . LAPAROSCOPIC CHOLECYSTECTOMY  2000  . NEGATIVE SLEEP STUDY  2000   per pt  . ORIF ANKLE  FRACTURE  01/10/2012   Procedure: OPEN REDUCTION INTERNAL FIXATION (ORIF) ANKLE FRACTURE;  Surgeon: Sanjuana Kava, MD;  Location: [facility];  Service: Orthopedics;  Laterality: Left;  . TOTAL THYROIDECTOMY  1985   benign tumor  . TRANSTHORACIC ECHOCARDIOGRAM  03-02-2009   normal echo,  ef 55-60%  . VAGINAL HYSTERECTOMY  1984  . WRIST SURGERY Left 2000   Past Medical History:  Diagnosis Date  . Anxiety   . Cervical radiculopathy   . Chronic back pain    R > L  . Chronic neck pain   . Depression   . Dysphagia    functional --  takes small bites  . Fibromyalgia   . GERD (gastroesophageal reflux disease)   . Hemorrhoids   . History of ankle fracture 01/08/2012   Status post open reduction internal fixation of a left ankle trimalleolar fracture by Dr. Iona Hansen on 01/10/2012.    Marland Kitchen History of benign thyroid tumor   . History of diabetes mellitus, type II    was diet controlled -- per pt pcp she was no longer diabetic  . History of hiatal hernia   . History of TIA (transient ischemic attack)    02-27-2009  no residual  . HTN (hypertension)   . Hyperlipidemia   . Hypothyroidism, postsurgical   . Lumbar radicular pain    R>L legs  . Migraine headache   . Mild intermittent asthma with allergic rhinitis without complication   . PONV (postoperative nausea and vomiting)   . Rotator cuff syndrome of right shoulder   . Urge urinary incontinence    refractory  . Wears glasses    There were no vitals taken for this visit.  Opioid Risk Score:   Fall Risk Score:  `1  Depression screen PHQ 2/9  Depression screen [facility] 2/9 10/17/2018 06/22/2018 05/23/2018 03/19/2018 12/26/2017 04/14/2017 03/21/2017  Decreased Interest 1 1 1 1 1 1 3   Down, Depressed, Hopeless 1 1 1 1 1 1 3   PHQ - 2 Score 2 2 2 2 2 2 6   Altered sleeping - - - - - - -  Tired, decreased energy - - - - - - -  Change in appetite - - - - - - -  Feeling bad or failure about yourself  - - - - - - -  Trouble concentrating - - - - -  - -  Moving slowly or fidgety/restless - - - - - - -  Suicidal thoughts - - - - - - -  PHQ-9 Score - - - - - - -  Some recent data might be hidden  Review of Systems     Objective:   Physical Exam Vitals signs and nursing note reviewed.  Constitutional:      Appearance: Normal appearance.  Neck:     Musculoskeletal: Normal range of motion and neck supple.  Cardiovascular:     Rate and Rhythm: Normal rate and regular rhythm.     Pulses: Normal pulses.     Heart sounds: Normal heart sounds.  Pulmonary:     Effort: Pulmonary effort is normal.     Breath sounds: Normal breath sounds.  Musculoskeletal:     Comments: Normal Muscle Bulk and Muscle Testing Reveals:  Upper Extremities: Full ROM and Muscle Strength 5/5 Right AC Joint Tenderness  Thoracic Paraspinal Tenderness: T-2-T-3 Mainly Right Side Lower Extremities: Full ROM and Muscle Strength 5/5 Arises from chair with ease Narrow Based  Gait   Skin:    General: Skin is warm and dry.  Neurological:     Mental Status: She is alert and oriented to person, place, and time.  Psychiatric:        Mood and Affect: Mood normal.        Behavior: Behavior normal.           Assessment & Plan:  1.History of fibromyalgia.Continue currentMedicationRegimen: HEP as Tolerated and Continue Gabapentin.12/24/2018 2. Chronicmidline thoracic back pain/low back pain/ Lumbosacral Spondylosis/ Lumbar Facet Arthropathy: Continue HEP as Tolerated. Continue to Monitor.12/24/2018 Refilled:Hydrocodone 7.5/325mg  one tablet twice a day as needed for pain #75. We will continue the opioid monitoring program, this consists of regular clinic visits, examinations, urine drug screen, pill counts as well as use of [place] Controlled Substance Reporting System. Continue Neurontin. 3.Cervicalgia/Cervical Radiculopathy:Continue withmedication regimen withGabapentin: 12/24/2018 4. Anxiety and depression.Continue Effexor  andKlonopin Psychiatry Prescribing.12/24/2018 5. Left Ankle OA: Orthopedist Following.12/24/2018 6. Bilateral Chronic knee Pain:No complaints today.Continue HEP Program. Continue to monitor.12/24/2018 7. Bilateral Carpal Tunnel Syndrome: No complaints today. Continue wearing splints. Continue to monitor. 12/24/2018 8. Polyarthralgia: Continue to Alternate Heat and Ice Therapy. Continue with current medication regime. Continue to Monitor.12/24/2018  F/U in 72month

## 2018-12-25 ENCOUNTER — Telehealth: Payer: Self-pay

## 2018-12-25 ENCOUNTER — Telehealth: Payer: Self-pay | Admitting: Registered Nurse

## 2018-12-25 DIAGNOSIS — M797 Fibromyalgia: Secondary | ICD-10-CM

## 2018-12-25 DIAGNOSIS — M47816 Spondylosis without myelopathy or radiculopathy, lumbar region: Secondary | ICD-10-CM

## 2018-12-25 NOTE — Telephone Encounter (Signed)
Patient called and stated she found her pills and she has #15 left

## 2018-12-25 NOTE — Telephone Encounter (Signed)
Talked to the daughter, Christa See, with patient in the background and stated that the patient found the pills. The number of pills in the bottle was 15. Daughter stated that she wanted to see if she can come today to show pills. Gave me number to call....that number is [phone]

## 2018-12-26 MED ORDER — HYDROCODONE-ACETAMINOPHEN 7.5-325 MG PO TABS: 1.0000 | ORAL_TABLET | Freq: Three times a day (TID) | ORAL | 0 refills | Status: DC | PRN

## 2018-12-26 NOTE — Telephone Encounter (Signed)
Placed a call to [name], no answer. Left message to return the call on her home, cell and daughter's voicemail is full. Unable to leave a message.

## 2018-12-26 NOTE — Telephone Encounter (Signed)
This provider spoke with Ms. Sahm, she found her Hydrocodone, she states she has 15 pills. New prescription e-scribed today with a fill date for 12/28/2018, she verbalizes understanding.

## 2018-12-27 DIAGNOSIS — N3011 Interstitial cystitis (chronic) with hematuria: Secondary | ICD-10-CM | POA: Diagnosis not present

## 2019-01-14 ENCOUNTER — Other Ambulatory Visit: Payer: Self-pay | Admitting: Physical Medicine & Rehabilitation

## 2019-01-14 DIAGNOSIS — M797 Fibromyalgia: Secondary | ICD-10-CM

## 2019-01-14 DIAGNOSIS — M47816 Spondylosis without myelopathy or radiculopathy, lumbar region: Secondary | ICD-10-CM

## 2019-01-22 ENCOUNTER — Other Ambulatory Visit: Payer: Self-pay | Admitting: Nurse Practitioner

## 2019-01-22 ENCOUNTER — Other Ambulatory Visit: Payer: Self-pay | Admitting: Registered Nurse

## 2019-01-22 DIAGNOSIS — M797 Fibromyalgia: Secondary | ICD-10-CM

## 2019-01-22 DIAGNOSIS — F418 Other specified anxiety disorders: Secondary | ICD-10-CM

## 2019-01-22 DIAGNOSIS — Z139 Encounter for screening, unspecified: Secondary | ICD-10-CM

## 2019-01-23 DIAGNOSIS — F411 Generalized anxiety disorder: Secondary | ICD-10-CM | POA: Diagnosis not present

## 2019-01-23 DIAGNOSIS — R69 Illness, unspecified: Secondary | ICD-10-CM | POA: Diagnosis not present

## 2019-01-29 ENCOUNTER — Telehealth: Payer: Self-pay | Admitting: Physical Medicine & Rehabilitation

## 2019-01-29 DIAGNOSIS — M797 Fibromyalgia: Secondary | ICD-10-CM

## 2019-01-29 DIAGNOSIS — M47816 Spondylosis without myelopathy or radiculopathy, lumbar region: Secondary | ICD-10-CM

## 2019-01-29 MED ORDER — HYDROCODONE-ACETAMINOPHEN 7.5-325 MG PO TABS: 1.0000 | ORAL_TABLET | Freq: Three times a day (TID) | ORAL | 0 refills | Status: DC | PRN

## 2019-01-29 NOTE — Telephone Encounter (Signed)
Patient calling to speak with [name].  Patient states she ran out of pain medications on Friday.  She was told she could take 3 a day.  Please call patient at [phone].

## 2019-01-29 NOTE — Telephone Encounter (Signed)
Returned her call, no answer. Left message to return the call. PMP was reviewed. Last prescription was filled on 12/28/2018. Hydrocodone e-scribed. Ms. General is aware of the above.She verbalizes understanding.

## 2019-02-04 ENCOUNTER — Encounter: Payer: Medicare HMO | Admitting: Registered Nurse

## 2019-02-12 ENCOUNTER — Ambulatory Visit (HOSPITAL_COMMUNITY)
Admission: RE | Admit: 2019-02-12 | Discharge: 2019-02-12 | Disposition: A | Discharge status: Home / Self Care | Payer: Medicare HMO | Source: Ambulatory Visit | Attending: Registered Nurse | Admitting: Registered Nurse

## 2019-02-12 ENCOUNTER — Encounter: Payer: Self-pay | Admitting: Registered Nurse

## 2019-02-12 ENCOUNTER — Encounter: Payer: Medicare HMO | Attending: Physical Medicine and Rehabilitation | Admitting: Registered Nurse

## 2019-02-12 ENCOUNTER — Other Ambulatory Visit: Payer: Self-pay

## 2019-02-12 VITALS — BP 153/87 | HR 66 | Temp 97.3°F | Resp 16 | Ht 66.0 in | Wt 208.6 lb

## 2019-02-12 DIAGNOSIS — M2550 Pain in unspecified joint: Secondary | ICD-10-CM

## 2019-02-12 DIAGNOSIS — M25562 Pain in left knee: Secondary | ICD-10-CM

## 2019-02-12 DIAGNOSIS — M19011 Primary osteoarthritis, right shoulder: Secondary | ICD-10-CM | POA: Diagnosis not present

## 2019-02-12 DIAGNOSIS — M7062 Trochanteric bursitis, left hip: Secondary | ICD-10-CM

## 2019-02-12 DIAGNOSIS — M47817 Spondylosis without myelopathy or radiculopathy, lumbosacral region: Secondary | ICD-10-CM | POA: Diagnosis not present

## 2019-02-12 DIAGNOSIS — M5416 Radiculopathy, lumbar region: Secondary | ICD-10-CM | POA: Diagnosis not present

## 2019-02-12 DIAGNOSIS — M797 Fibromyalgia: Secondary | ICD-10-CM

## 2019-02-12 DIAGNOSIS — Z5181 Encounter for therapeutic drug level monitoring: Secondary | ICD-10-CM | POA: Diagnosis not present

## 2019-02-12 DIAGNOSIS — Z79899 Other long term (current) drug therapy: Secondary | ICD-10-CM | POA: Diagnosis not present

## 2019-02-12 DIAGNOSIS — M47816 Spondylosis without myelopathy or radiculopathy, lumbar region: Secondary | ICD-10-CM

## 2019-02-12 DIAGNOSIS — M7918 Myalgia, other site: Secondary | ICD-10-CM

## 2019-02-12 DIAGNOSIS — G894 Chronic pain syndrome: Secondary | ICD-10-CM

## 2019-02-12 DIAGNOSIS — S8992XA Unspecified injury of left lower leg, initial encounter: Secondary | ICD-10-CM | POA: Diagnosis not present

## 2019-02-12 DIAGNOSIS — Z79891 Long term (current) use of opiate analgesic: Secondary | ICD-10-CM

## 2019-02-12 DIAGNOSIS — G8929 Other chronic pain: Secondary | ICD-10-CM

## 2019-02-12 MED ORDER — HYDROCODONE-ACETAMINOPHEN 7.5-325 MG PO TABS: 1.0000 | ORAL_TABLET | Freq: Three times a day (TID) | ORAL | 0 refills | Status: DC | PRN

## 2019-02-12 NOTE — Progress Notes (Signed)
Subjective:    Patient ID: [name] [name], female    DOB: June 04, 1946, 73 y.o.   MRN: [id number]  HPI: [name] [name] is a 74 y.o. female who returns for follow up appointment for chronic pain and medication refill. She states her pain is located in her lower back radiating into her left hip and left lower extremity and left knee pain. Also reports generalize joint pain and  increase intensity of left knee pain, denies falling. Will order X-ray, she verbalizes understanding. She  rates her pain 7. her current exercise regime is walking, she was encouraged to increase HEP as tolerated, she verbalizes understanding.   [name] [name] states a few weeks ago she was having chest pain, also reports she did not seek medical attention. Today, she denies chest pain or SOB. She was instructed if this occurs again to call her 911, PCP, and to  seek medical attention at Emergency room, she verbalizes understanding.   [name] [name] equivalent is 22.50 MME.  Last UDS was Performed on 08/20/2018, it was consistent.   Pain Inventory Average Pain 7 Pain Right Now 7 My pain is constant, burning and dull  In the last 24 hours, has pain interfered with the following? General activity 8 Relation with others 6 Enjoyment of life 5 What TIME of day is your pain at its worst? daytime Sleep (in general) Fair  Pain is worse with: sitting and some activites Pain improves with: rest and medication Relief from Meds: 6  Mobility use a cane ability to climb steps?  yes do you drive?  no  Function retired  Neuro/Psych bladder control problems bowel control problems weakness tingling trouble walking depression anxiety  Prior Studies Any changes since last visit?  no  Physicians involved in your care Any changes since last visit?  no   Family History  Problem Relation Age of Onset  . Cancer Other        breast -- grandmother  . Cancer Other        lung -- uncles (3)  . Leukemia Sister   .  Diabetes Father   . Coronary artery disease Father   . Diabetes Mother    Social History   Socioeconomic History  . Marital status: Married    Spouse name: Not on file  . Number of children: Not on file  . Years of education: Not on file  . Highest education level: Not on file  Occupational History  . Not on file  Social Needs  . Financial resource strain: Not on file  . Food insecurity    Worry: Not on file    Inability: Not on file  . Transportation needs    Medical: Not on file    Non-medical: Not on file  Tobacco Use  . Smoking status: Never Smoker  . Smokeless tobacco: Never Used  Substance and Sexual Activity  . Alcohol use: No  . Drug use: No  . Sexual activity: Not on file  Lifestyle  . Physical activity    Days per week: Not on file    Minutes per session: Not on file  . Stress: Not on file  Relationships  . Social Herbalist on phone: Not on file    Gets together: Not on file    Attends religious service: Not on file    Active member of club or organization: Not on file    Attends meetings of clubs or organizations: Not on file  Relationship status: Not on file  Other Topics Concern  . Not on file  Social History Narrative  . Not on file   Past Surgical History:  Procedure Laterality Date  . CARDIAC CATHETERIZATION  01-17-2003  dr Darnell Level brodie   normal coronary arteries and LVF,  ef 60%  . EXCISION MORTON'S NEUROMA  2002   bilateral feet  . EXCISION OF BREAST BIOPSY Right 1990's   benign  . INTERSTIM IMPLANT PLACEMENT N/A 02/10/2015   Procedure: Barrie Lyme IMPLANT FIRST STAGE;  Surgeon: Bjorn Loser, MD;  Location: [facility];  Service: Urology;  Laterality: N/A;  . INTERSTIM IMPLANT PLACEMENT N/A 02/10/2015   Procedure: Barrie Lyme IMPLANT SECOND STAGE;  Surgeon: Bjorn Loser, MD;  Location: [facility];  Service: Urology;  Laterality: N/A;  . LAPAROSCOPIC CHOLECYSTECTOMY  2000  . NEGATIVE SLEEP STUDY   2000   per pt  . ORIF ANKLE FRACTURE  01/10/2012   Procedure: OPEN REDUCTION INTERNAL FIXATION (ORIF) ANKLE FRACTURE;  Surgeon: Sanjuana Kava, MD;  Location: [facility];  Service: Orthopedics;  Laterality: Left;  . TOTAL THYROIDECTOMY  1985   benign tumor  . TRANSTHORACIC ECHOCARDIOGRAM  03-02-2009   normal echo,  ef 55-60%  . VAGINAL HYSTERECTOMY  1984  . WRIST SURGERY Left 2000   Past Medical History:  Diagnosis Date  . Anxiety   . Cervical radiculopathy   . Chronic back pain    R > L  . Chronic neck pain   . Depression   . Dysphagia    functional --  takes small bites  . Fibromyalgia   . GERD (gastroesophageal reflux disease)   . Hemorrhoids   . History of ankle fracture 01/08/2012   Status post open reduction internal fixation of a left ankle trimalleolar fracture by Dr. Iona Hansen on 01/10/2012.    Marland Kitchen History of benign thyroid tumor   . History of diabetes mellitus, type II    was diet controlled -- per pt pcp she was no longer diabetic  . History of hiatal hernia   . History of TIA (transient ischemic attack)    02-27-2009  no residual  . HTN (hypertension)   . Hyperlipidemia   . Hypothyroidism, postsurgical   . Lumbar radicular pain    R>L legs  . Migraine headache   . Mild intermittent asthma with allergic rhinitis without complication   . PONV (postoperative nausea and vomiting)   . Rotator cuff syndrome of right shoulder   . Urge urinary incontinence    refractory  . Wears glasses    There were no vitals taken for this visit.  Opioid Risk Score:   Fall Risk Score:  `1  Depression screen PHQ 2/9  Depression screen [facility] 2/9 10/17/2018 06/22/2018 05/23/2018 03/19/2018 12/26/2017 04/14/2017 03/21/2017  Decreased Interest 1 1 1 1 1 1 3   Down, Depressed, Hopeless 1 1 1 1 1 1 3   PHQ - 2 Score 2 2 2 2 2 2 6   Altered sleeping - - - - - - -  Tired, decreased energy - - - - - - -  Change in appetite - - - - - - -  Feeling bad or failure about yourself  - - - - - - -   Trouble concentrating - - - - - - -  Moving slowly or fidgety/restless - - - - - - -  Suicidal thoughts - - - - - - -  PHQ-9 Score - - - - - - -  Some recent data might be hidden     Review of Systems  Constitutional: Positive for chills, fever and unexpected weight change.  HENT: Negative.   Eyes: Negative.   Respiratory: Positive for shortness of breath and wheezing.   Cardiovascular: Positive for leg swelling.  Gastrointestinal: Positive for abdominal pain, constipation and diarrhea.  Endocrine:       High/low bloodsugar  Genitourinary: Positive for difficulty urinating and dysuria.  Musculoskeletal: Positive for arthralgias, back pain, gait problem and myalgias.  Skin: Positive for rash.  Allergic/Immunologic: Negative.   Hematological: Negative.   Psychiatric/Behavioral: Positive for dysphoric mood. The patient is nervous/anxious.        Objective:   Physical Exam Vitals signs and nursing note reviewed.  Constitutional:      Appearance: Normal appearance.  Neck:     Musculoskeletal: Neck supple.  Cardiovascular:     Rate and Rhythm: Normal rate and regular rhythm.     Pulses: Normal pulses.     Heart sounds: Normal heart sounds.  Pulmonary:     Effort: Pulmonary effort is normal.     Breath sounds: Normal breath sounds.  Musculoskeletal:     Comments: Normal Muscle Bulk and Muscle Testing Reveals:  Upper Extremities: Full ROM and Muscle Strength 5/5  Lumbar Hypersensitivity Left Greater Trochanter Tenderness Lower Extremities: Full ROM and Muscle Strength 5/5 Left Lower Extremity Flexion Produces Pain into Patella Arises from Table slowly Narrow Based  Gait   Skin:    General: Skin is warm and dry.  Neurological:     Mental Status: She is alert and oriented to person, place, and time.  Psychiatric:        Mood and Affect: Mood normal.        Behavior: Behavior normal.           Assessment & Plan:  1.History of fibromyalgia.Continue  currentMedicationRegimen: HEP as Tolerated and Continue Gabapentin.02/12/2019 2. Chronicmidline thoracic back pain/low back pain/ Lumbosacral Spondylosis/ Lumbar Facet Arthropathy: Continue HEP as Tolerated. Continue to Monitor.02/12/2019 Refilled:Hydrocodone 7.5/325mg  one tablet twice a day as needed for pain, may take an ectra tablet when pain is sever.  #75. We will continue the opioid monitoring program, this consists of regular clinic visits, examinations, urine drug screen, pill counts as well as use of [place] Controlled Substance Reporting System. Continue Neurontin. 3.Cervicalgia/Cervical Radiculopathy:Continue withmedication regimen withGabapentin: 02/12/2019 4. Anxiety and depression.Continue Effexor andKlonopin Psychiatry Prescribing.02/12/2019 5. Left Ankle OA: No complaints today. Orthopedist Following.02/12/2019 6. Bilateral Chronic knee Pain/ Acute Left Knee Pain: RX: Knee X-ray/.Continue HEP Program. Continue to monitor.02/12/2019 7. Bilateral Carpal Tunnel Syndrome: No complaints today. Continue wearing splints. Continue to monitor. 02/12/2019 8. Polyarthralgia: Continue to Alternate Heat and Ice Therapy. Continue with current medication regime. Continue to Monitor.02/12/2019  15 minutes of face to face patient care time was spent during this visit. All questions were encouraged and answered.  F/U in 5month

## 2019-02-14 DIAGNOSIS — N3941 Urge incontinence: Secondary | ICD-10-CM | POA: Diagnosis not present

## 2019-02-14 DIAGNOSIS — N3011 Interstitial cystitis (chronic) with hematuria: Secondary | ICD-10-CM | POA: Diagnosis not present

## 2019-02-20 DIAGNOSIS — Z1231 Encounter for screening mammogram for malignant neoplasm of breast: Secondary | ICD-10-CM | POA: Diagnosis not present

## 2019-03-14 DIAGNOSIS — F4310 Post-traumatic stress disorder, unspecified: Secondary | ICD-10-CM | POA: Diagnosis not present

## 2019-03-14 DIAGNOSIS — F3132 Bipolar disorder, current episode depressed, moderate: Secondary | ICD-10-CM | POA: Diagnosis not present

## 2019-03-14 DIAGNOSIS — R69 Illness, unspecified: Secondary | ICD-10-CM | POA: Diagnosis not present

## 2019-03-14 DIAGNOSIS — I10 Essential (primary) hypertension: Secondary | ICD-10-CM | POA: Diagnosis not present

## 2019-03-14 DIAGNOSIS — F411 Generalized anxiety disorder: Secondary | ICD-10-CM | POA: Diagnosis not present

## 2019-03-14 DIAGNOSIS — F5105 Insomnia due to other mental disorder: Secondary | ICD-10-CM | POA: Diagnosis not present

## 2019-03-19 ENCOUNTER — Encounter: Payer: Medicare HMO | Admitting: Registered Nurse

## 2019-03-26 ENCOUNTER — Encounter: Payer: Medicare HMO | Attending: Physical Medicine and Rehabilitation | Admitting: Registered Nurse

## 2019-03-26 ENCOUNTER — Encounter: Payer: Self-pay | Admitting: Registered Nurse

## 2019-03-26 ENCOUNTER — Other Ambulatory Visit: Payer: Self-pay

## 2019-03-26 VITALS — BP 140/81 | HR 75 | Temp 98.9°F | Ht 66.0 in | Wt 210.0 lb

## 2019-03-26 DIAGNOSIS — Z79891 Long term (current) use of opiate analgesic: Secondary | ICD-10-CM

## 2019-03-26 DIAGNOSIS — M797 Fibromyalgia: Secondary | ICD-10-CM

## 2019-03-26 DIAGNOSIS — M7918 Myalgia, other site: Secondary | ICD-10-CM

## 2019-03-26 DIAGNOSIS — Z5181 Encounter for therapeutic drug level monitoring: Secondary | ICD-10-CM

## 2019-03-26 DIAGNOSIS — M5416 Radiculopathy, lumbar region: Secondary | ICD-10-CM

## 2019-03-26 DIAGNOSIS — M47817 Spondylosis without myelopathy or radiculopathy, lumbosacral region: Secondary | ICD-10-CM

## 2019-03-26 DIAGNOSIS — G894 Chronic pain syndrome: Secondary | ICD-10-CM | POA: Diagnosis not present

## 2019-03-26 DIAGNOSIS — Z79899 Other long term (current) drug therapy: Secondary | ICD-10-CM | POA: Diagnosis not present

## 2019-03-26 DIAGNOSIS — M47816 Spondylosis without myelopathy or radiculopathy, lumbar region: Secondary | ICD-10-CM | POA: Diagnosis not present

## 2019-03-26 DIAGNOSIS — M2550 Pain in unspecified joint: Secondary | ICD-10-CM

## 2019-03-26 DIAGNOSIS — M19011 Primary osteoarthritis, right shoulder: Secondary | ICD-10-CM | POA: Diagnosis not present

## 2019-03-26 MED ORDER — HYDROCODONE-ACETAMINOPHEN 7.5-325 MG PO TABS: 1.0000 | ORAL_TABLET | Freq: Three times a day (TID) | ORAL | 0 refills | Status: DC | PRN

## 2019-03-26 NOTE — Progress Notes (Signed)
Subjective:    Patient ID: [name] [name], female    DOB: 1946/05/25, 73 y.o.   MRN: [id number]  HPI: BREELYN [name] is a 73 y.o. female who returns for follow up appointment for chronic pain and medication refill. She states her pain is located in her lower back and bilateral lower extremity pain with tingling and burning L>R. She rates her pain 9. Her  current exercise regime is walking and performing stretching exercises.  [name] Morphine equivalent is 22.50 MME. She  is also prescribed Clonazepam by Dr. Jessy Oto .We have discussed the black box warning of using opioids and benzodiazepines. I highlighted the dangers of using these drugs together and discussed the adverse events including respiratory suppression, overdose, cognitive impairment and importance of compliance with current regimen. We will continue to monitor and adjust as indicated.  She  is being closely monitored and under the care of her psychiatrist Dr. Jessy Oto.  Last UDS was Performed on 08/20/2018, it was consistent.    Pain Inventory Average Pain 8-9 Pain Right Now 9 My pain is constant, burning and aching  In the last 24 hours, has pain interfered with the following? General activity 7 Relation with others 3 Enjoyment of life 8 What TIME of day is your pain at its worst? all day Sleep (in general) Fair  Pain is worse with: walking, inactivity, standing and some activites Pain improves with: medication Relief from Meds: n/a  Mobility use a cane ability to climb steps?  yes do you drive?  yes Do you have any goals in this area?  yes  Function I need assistance with the following:  household duties and shopping Do you have any goals in this area?  yes  Neuro/Psych weakness tremor trouble walking spasms depression anxiety  Prior Studies Any changes since last visit?  yes x-rays  Physicians involved in your care Any changes since last visit?  no   Family History  Problem Relation Age of Onset  .  Cancer Other        breast -- grandmother  . Cancer Other        lung -- uncles (3)  . Leukemia Sister   . Diabetes Father   . Coronary artery disease Father   . Diabetes Mother    Social History   Socioeconomic History  . Marital status: Married    Spouse name: Not on file  . Number of children: Not on file  . Years of education: Not on file  . Highest education level: Not on file  Occupational History  . Not on file  Social Needs  . Financial resource strain: Not on file  . Food insecurity    Worry: Not on file    Inability: Not on file  . Transportation needs    Medical: Not on file    Non-medical: Not on file  Tobacco Use  . Smoking status: Never Smoker  . Smokeless tobacco: Never Used  Substance and Sexual Activity  . Alcohol use: No  . Drug use: No  . Sexual activity: Not on file  Lifestyle  . Physical activity    Days per week: Not on file    Minutes per session: Not on file  . Stress: Not on file  Relationships  . Social Herbalist on phone: Not on file    Gets together: Not on file    Attends religious service: Not on file    Active member of club or organization:  Not on file    Attends meetings of clubs or organizations: Not on file    Relationship status: Not on file  Other Topics Concern  . Not on file  Social History Narrative  . Not on file   Past Surgical History:  Procedure Laterality Date  . CARDIAC CATHETERIZATION  01-17-2003  dr Darnell Level brodie   normal coronary arteries and LVF,  ef 60%  . EXCISION MORTON'S NEUROMA  2002   bilateral feet  . EXCISION OF BREAST BIOPSY Right 1990's   benign  . INTERSTIM IMPLANT PLACEMENT N/A 02/10/2015   Procedure: Barrie Lyme IMPLANT FIRST STAGE;  Surgeon: Bjorn Loser, MD;  Location: [facility];  Service: Urology;  Laterality: N/A;  . INTERSTIM IMPLANT PLACEMENT N/A 02/10/2015   Procedure: Barrie Lyme IMPLANT SECOND STAGE;  Surgeon: Bjorn Loser, MD;  Location[facility];  Service: Urology;  Laterality: N/A;  . LAPAROSCOPIC CHOLECYSTECTOMY  2000  . NEGATIVE SLEEP STUDY  2000   per pt  . ORIF ANKLE FRACTURE  01/10/2012   Procedure: OPEN REDUCTION INTERNAL FIXATION (ORIF) ANKLE FRACTURE;  Surgeon: Sanjuana Kava, MD;  Location: [facility];  Service: Orthopedics;  Laterality: Left;  . TOTAL THYROIDECTOMY  1985   benign tumor  . TRANSTHORACIC ECHOCARDIOGRAM  03-02-2009   normal echo,  ef 55-60%  . VAGINAL HYSTERECTOMY  1984  . WRIST SURGERY Left 2000   Past Medical History:  Diagnosis Date  . Anxiety   . Cervical radiculopathy   . Chronic back pain    R > L  . Chronic neck pain   . Depression   . Dysphagia    functional --  takes small bites  . Fibromyalgia   . GERD (gastroesophageal reflux disease)   . Hemorrhoids   . History of ankle fracture 01/08/2012   Status post open reduction internal fixation of a left ankle trimalleolar fracture by Dr. Iona Hansen on 01/10/2012.    Marland Kitchen History of benign thyroid tumor   . History of diabetes mellitus, type II    was diet controlled -- per pt pcp she was no longer diabetic  . History of hiatal hernia   . History of TIA (transient ischemic attack)    02-27-2009  no residual  . HTN (hypertension)   . Hyperlipidemia   . Hypothyroidism, postsurgical   . Lumbar radicular pain    R>L legs  . Migraine headache   . Mild intermittent asthma with allergic rhinitis without complication   . PONV (postoperative nausea and vomiting)   . Rotator cuff syndrome of right shoulder   . Urge urinary incontinence    refractory  . Wears glasses    Temp 98.9 F (37.2 C) (Oral)   Ht 5\' 6"  (1.676 m)   Wt 210 lb (95.3 kg)   BMI 33.89 kg/m   Opioid Risk Score:   Fall Risk Score:  `1  Depression screen PHQ 2/9  Depression screen [facility] 2/9 10/17/2018 06/22/2018 05/23/2018 03/19/2018 12/26/2017 04/14/2017 03/21/2017  Decreased Interest 1 1 1 1 1 1 3   Down, Depressed, Hopeless 1 1 1 1 1 1 3   PHQ - 2 Score 2 2 2 2 2 2  6   Altered sleeping - - - - - - -  Tired, decreased energy - - - - - - -  Change in appetite - - - - - - -  Feeling bad or failure about yourself  - - - - - - -  Trouble concentrating - - - - - - -  Moving slowly or fidgety/restless - - - - - - -  Suicidal thoughts - - - - - - -  PHQ-9 Score - - - - - - -  Some recent data might be hidden    Review of Systems  Constitutional: Positive for activity change and fever.  HENT: Negative.   Eyes: Negative.   Respiratory: Positive for apnea.   Cardiovascular: Negative.   Gastrointestinal: Positive for constipation.  Genitourinary: Negative.   Musculoskeletal: Positive for gait problem and joint swelling.  Skin: Positive for rash.  Allergic/Immunologic: Negative.   Neurological: Positive for tremors and weakness.  Hematological: Negative.   Psychiatric/Behavioral: Positive for dysphoric mood. The patient is nervous/anxious.   All other systems reviewed and are negative.      Objective:   Physical Exam Constitutional:      Appearance: Normal appearance.  Neck:     Musculoskeletal: Normal range of motion and neck supple.  Cardiovascular:     Rate and Rhythm: Normal rate and regular rhythm.     Pulses: Normal pulses.     Heart sounds: Normal heart sounds.  Pulmonary:     Effort: Pulmonary effort is normal.     Breath sounds: Normal breath sounds.  Musculoskeletal:     Comments: Normal Muscle Bulk and Muscle Testing Reveals:  Upper Extremities: Full ROM and Muscle Strength 5/5  Lumbar Paraspinal Tenderness: L-4-L-5  Lower Extremities: Full ROM and Muscle Strength 5/5 Arises from table with ease Narrow Based  Gait   Skin:    General: Skin is warm and dry.  Neurological:     Mental Status: She is alert and oriented to person, place, and time.  Psychiatric:        Mood and Affect: Mood normal.        Behavior: Behavior normal.           Assessment & Plan:  1.History of fibromyalgia.Continue currentMedicationRegimen:  HEP as Tolerated and Continue Gabapentin.03/26/2019 2. Chronicmidline thoracic back pain/low back pain/ Lumbosacral Spondylosis/ Lumbar Facet Arthropathy: Continue HEP as Tolerated. Continue to Monitor.03/26/2019 Refilled:Hydrocodone 7.5/325mg  one tablet twice a day as needed for pain, may take an ectra tablet when pain is sever.  #75. We will continue the opioid monitoring program, this consists of regular clinic visits, examinations, urine drug screen, pill counts as well as use of [place] Controlled Substance Reporting System. Continue Neurontin. 3.Cervicalgia/Cervical Radiculopathy:No complaints today.Continue withmedication regimen withGabapentin: 03/26/2019 4. Anxiety and depression.Continue Effexor andKlonopin Psychiatry Prescribing.03/26/2019 5. Left Ankle OA: No complaints today. Orthopedist Following.03/26/2019 6. Bilateral Chronic knee Pain: Continue HEP Program. Continue to monitor.03/26/2019 7. Bilateral Carpal Tunnel Syndrome: No complaints today. Continue wearing splints. Continue to monitor. 03/26/2019 8. Polyarthralgia: Continue to Alternate Heat and Ice Therapy. Continue with current medication regime. Continue to Monitor.03/26/2019  15 minutes of face to face patient care time was spent during this visit. All questions were encouraged and answered.  F/U in 66month

## 2019-03-28 DIAGNOSIS — H5203 Hypermetropia, bilateral: Secondary | ICD-10-CM | POA: Diagnosis not present

## 2019-03-28 DIAGNOSIS — E119 Type 2 diabetes mellitus without complications: Secondary | ICD-10-CM | POA: Diagnosis not present

## 2019-04-01 ENCOUNTER — Telehealth: Payer: Self-pay

## 2019-04-01 NOTE — Telephone Encounter (Signed)
Patient called requesting the results from her current knee x-rays.

## 2019-04-02 ENCOUNTER — Telehealth: Payer: Self-pay | Admitting: Registered Nurse

## 2019-04-02 NOTE — Telephone Encounter (Signed)
Return [name] call again, no answer. Left message to return the call.

## 2019-04-02 NOTE — Telephone Encounter (Signed)
Return Ms. Lissa Merlin call, no answer. Left message to return the call.

## 2019-04-18 DIAGNOSIS — R300 Dysuria: Secondary | ICD-10-CM | POA: Diagnosis not present

## 2019-04-18 DIAGNOSIS — N3011 Interstitial cystitis (chronic) with hematuria: Secondary | ICD-10-CM | POA: Diagnosis not present

## 2019-04-25 ENCOUNTER — Encounter: Payer: Medicare HMO | Attending: Physical Medicine and Rehabilitation | Admitting: Registered Nurse

## 2019-04-25 ENCOUNTER — Encounter: Payer: Self-pay | Admitting: Registered Nurse

## 2019-04-25 ENCOUNTER — Other Ambulatory Visit: Payer: Self-pay

## 2019-04-25 VITALS — BP 109/72 | HR 65 | Temp 97.7°F | Ht 66.0 in | Wt 208.0 lb

## 2019-04-25 DIAGNOSIS — M2550 Pain in unspecified joint: Secondary | ICD-10-CM | POA: Diagnosis not present

## 2019-04-25 DIAGNOSIS — Z79899 Other long term (current) drug therapy: Secondary | ICD-10-CM | POA: Insufficient documentation

## 2019-04-25 DIAGNOSIS — G894 Chronic pain syndrome: Secondary | ICD-10-CM

## 2019-04-25 DIAGNOSIS — Z79891 Long term (current) use of opiate analgesic: Secondary | ICD-10-CM | POA: Diagnosis not present

## 2019-04-25 DIAGNOSIS — Z5181 Encounter for therapeutic drug level monitoring: Secondary | ICD-10-CM | POA: Insufficient documentation

## 2019-04-25 DIAGNOSIS — M47816 Spondylosis without myelopathy or radiculopathy, lumbar region: Secondary | ICD-10-CM | POA: Diagnosis not present

## 2019-04-25 DIAGNOSIS — M19011 Primary osteoarthritis, right shoulder: Secondary | ICD-10-CM | POA: Diagnosis not present

## 2019-04-25 DIAGNOSIS — M797 Fibromyalgia: Secondary | ICD-10-CM

## 2019-04-25 DIAGNOSIS — M47817 Spondylosis without myelopathy or radiculopathy, lumbosacral region: Secondary | ICD-10-CM

## 2019-04-25 MED ORDER — GABAPENTIN 300 MG PO CAPS: 300.0000 mg | ORAL_CAPSULE | Freq: Three times a day (TID) | ORAL | 2 refills | Status: DC

## 2019-04-25 NOTE — Progress Notes (Signed)
Subjective:    Patient ID: [name] [name], female    DOB: 03/08/1946, 73 y.o.   MRN: [id number]  HPI: [name] [name] is a 73 y.o. female who returns for follow up appointment for chronic pain and medication refill. She states her pain is located in her left lower extremity. She rates her pain 5. Her current exercise regime is walking and performing stretching exercises.  [name] [name] states she have been having constipation and believes it was related to the hydrocodone, she began weaning herself off the hydrocodone. She would like to continue to weaned off the hydrocodone. She states she had a good bowel movement today. She states her PCP is aware of the above and we discussed the above in detail with [name] [name], they verbalize understanding. Also instructed if this occurs again to follow up with PCP and gastroenterologist, they verbalize understanding.   [name] [name] Morphine equivalent is 22.50 MME.  UDS was ordered today.    Pain Inventory Average Pain 6 Pain Right Now 5 My pain is intermittent, sharp, burning and tingling  In the last 24 hours, has pain interfered with the following? General activity 5 Relation with others 4 Enjoyment of life 7 What TIME of day is your pain at its worst? daytime and night Sleep (in general) Fair  Pain is worse with: sitting Pain improves with: rest Relief from Meds: 5  Mobility use a cane how many minutes can you walk? 15 ability to climb steps?  yes do you drive?  yes  Function Do you have any goals in this area?  yes  Neuro/Psych bladder control problems weakness tingling spasms depression anxiety  Prior Studies x-rays  Physicians involved in your care Urologist   Family History  Problem Relation Age of Onset  . Cancer Other        breast -- grandmother  . Cancer Other        lung -- uncles (3)  . Leukemia Sister   . Diabetes Father   . Coronary artery disease Father   . Diabetes Mother    Social History    Socioeconomic History  . Marital status: Married    Spouse name: Not on file  . Number of children: Not on file  . Years of education: Not on file  . Highest education level: Not on file  Occupational History  . Not on file  Social Needs  . Financial resource strain: Not on file  . Food insecurity    Worry: Not on file    Inability: Not on file  . Transportation needs    Medical: Not on file    Non-medical: Not on file  Tobacco Use  . Smoking status: Never Smoker  . Smokeless tobacco: Never Used  Substance and Sexual Activity  . Alcohol use: No  . Drug use: No  . Sexual activity: Not on file  Lifestyle  . Physical activity    Days per week: Not on file    Minutes per session: Not on file  . Stress: Not on file  Relationships  . Social Herbalist on phone: Not on file    Gets together: Not on file    Attends religious service: Not on file    Active member of club or organization: Not on file    Attends meetings of clubs or organizations: Not on file    Relationship status: Not on file  Other Topics Concern  . Not on file  Social History Narrative  . Not on file   Past Surgical History:  Procedure Laterality Date  . CARDIAC CATHETERIZATION  01-17-2003  dr Darnell Level brodie   normal coronary arteries and LVF,  ef 60%  . EXCISION MORTON'S NEUROMA  2002   bilateral feet  . EXCISION OF BREAST BIOPSY Right 1990's   benign  . INTERSTIM IMPLANT PLACEMENT N/A 02/10/2015   Procedure: Barrie Lyme IMPLANT FIRST STAGE;  Surgeon: Bjorn Loser, MD;  Location: [facility];  Service: Urology;  Laterality: N/A;  . INTERSTIM IMPLANT PLACEMENT N/A 02/10/2015   Procedure: Barrie Lyme IMPLANT SECOND STAGE;  Surgeon: Bjorn Loser, MD;  Location: [facility];  Service: Urology;  Laterality: N/A;  . LAPAROSCOPIC CHOLECYSTECTOMY  2000  . NEGATIVE SLEEP STUDY  2000   per pt  . ORIF ANKLE FRACTURE  01/10/2012   Procedure: OPEN REDUCTION INTERNAL FIXATION  (ORIF) ANKLE FRACTURE;  Surgeon: Sanjuana Kava, MD;  Location: [facility];  Service: Orthopedics;  Laterality: Left;  . TOTAL THYROIDECTOMY  1985   benign tumor  . TRANSTHORACIC ECHOCARDIOGRAM  03-02-2009   normal echo,  ef 55-60%  . VAGINAL HYSTERECTOMY  1984  . WRIST SURGERY Left 2000   Past Medical History:  Diagnosis Date  . Anxiety   . Cervical radiculopathy   . Chronic back pain    R > L  . Chronic neck pain   . Depression   . Dysphagia    functional --  takes small bites  . Fibromyalgia   . GERD (gastroesophageal reflux disease)   . Hemorrhoids   . History of ankle fracture 01/08/2012   Status post open reduction internal fixation of a left ankle trimalleolar fracture by Dr. Iona Hansen on 01/10/2012.    Marland Kitchen History of benign thyroid tumor   . History of diabetes mellitus, type II    was diet controlled -- per pt pcp she was no longer diabetic  . History of hiatal hernia   . History of TIA (transient ischemic attack)    02-27-2009  no residual  . HTN (hypertension)   . Hyperlipidemia   . Hypothyroidism, postsurgical   . Lumbar radicular pain    R>L legs  . Migraine headache   . Mild intermittent asthma with allergic rhinitis without complication   . PONV (postoperative nausea and vomiting)   . Rotator cuff syndrome of right shoulder   . Urge urinary incontinence    refractory  . Wears glasses    BP 109/72   Pulse 65   Temp 97.7 F (36.5 C)   Ht 5\' 6"  (1.676 m)   Wt 208 lb (94.3 kg)   SpO2 96%   BMI 33.57 kg/m   Opioid Risk Score:   Fall Risk Score:  `1  Depression screen PHQ 2/9  Depression screen [facility] 2/9 10/17/2018 06/22/2018 05/23/2018 03/19/2018 12/26/2017 04/14/2017 03/21/2017  Decreased Interest 1 1 1 1 1 1 3   Down, Depressed, Hopeless 1 1 1 1 1 1 3   PHQ - 2 Score 2 2 2 2 2 2 6   Altered sleeping - - - - - - -  Tired, decreased energy - - - - - - -  Change in appetite - - - - - - -  Feeling bad or failure about yourself  - - - - - - -  Trouble  concentrating - - - - - - -  Moving slowly or fidgety/restless - - - - - - -  Suicidal thoughts - - - - - - -  PHQ-9 Score - - - - - - -  Some recent data might be hidden    Review of Systems  Constitutional: Positive for appetite change.  Respiratory: Positive for cough.   Gastrointestinal: Positive for constipation. Negative for diarrhea.  Genitourinary: Positive for dysuria.  Neurological: Positive for weakness.  Psychiatric/Behavioral: Positive for dysphoric mood. The patient is nervous/anxious.   All other systems reviewed and are negative.      Objective:   Physical Exam Vitals signs and nursing note reviewed.  Constitutional:      Appearance: Normal appearance.  Neck:     Musculoskeletal: Normal range of motion and neck supple.  Cardiovascular:     Rate and Rhythm: Normal rate and regular rhythm.     Pulses: Normal pulses.     Heart sounds: Normal heart sounds.  Pulmonary:     Effort: Pulmonary effort is normal.     Breath sounds: Normal breath sounds.  Musculoskeletal:     Comments: Normal Muscle Bulk and Muscle Testing Reveals:  Upper Extremities: Full ROM and Muscle Strength 5/5 Thoracic Paraspinal Tenderness: T-1-T-3 Lower Extremities: Full ROM and Muscle Strength 5/5 Left Lower Extremity Flexion Produces Pain into her left lower extremity. Arises from Table with ease Narrow Based Gait   Skin:    General: Skin is warm and dry.  Neurological:     Mental Status: She is alert and oriented to person, place, and time.  Psychiatric:        Mood and Affect: Mood normal.        Behavior: Behavior normal.           Assessment & Plan:  1.History of fibromyalgia.Continue currentMedicationRegimen: HEP as Tolerated and Continue Gabapentin.04/25/2019 2. Chronicmidline thoracic back pain/low back pain/ Lumbosacral Spondylosis/ Lumbar Facet Arthropathy: Continue HEP as Tolerated. Continue to Monitor.04/25/2019 Continue to wean the Hydrocodone 7.5/325mg  one  tablet daily as needed for pain. Call office or send My-Chart message regarding the above in a week, she verbalizes understanding.  We will continue the opioid monitoring program, this consists of regular clinic visits, examinations, urine drug screen, pill counts as well as use of [place] Controlled Substance Reporting System. Continue Neurontin. 3.Cervicalgia/Cervical Radiculopathy:No complaints today.Continue withmedication regimen withGabapentin: 04/25/2019 4. Anxiety and depression.Continue Effexor andKlonopin Psychiatry Prescribing.04/25/2019 5. Left Ankle OA:No complaints today.Orthopedist Following.04/25/2019 6. Bilateral Chronic knee Pain: Continue HEP Program. Continue to monitor.04/25/2019 7. Bilateral Carpal Tunnel Syndrome: No complaints today. Continue wearing splints. Continue to monitor. 04/25/2019 8. Polyarthralgia: Continue to Alternate Heat and Ice Therapy. Continue with current medication regime. Continue to Monitor.04/25/2019  58minutes of face to face patient care time was spent during this visit. All questions were encouraged and answered.  F/U in 45months

## 2019-04-25 NOTE — Patient Instructions (Signed)
Call or send My- Chart Message Regarding You're Pain.   My Chart: [phone]   No Hydrocodone prescription given today: We will continue with the slow weaning.

## 2019-04-30 ENCOUNTER — Telehealth: Payer: Self-pay

## 2019-04-30 NOTE — Telephone Encounter (Signed)
Patient called requesting for [name] to call back. I tried calling pt back no answer.

## 2019-04-30 NOTE — Telephone Encounter (Signed)
Returned [name] [name] call regarding her medication, no answer. Left message to return the call.

## 2019-04-30 NOTE — Telephone Encounter (Signed)
Patient called to have [name] [name] call her back.  She states that she can't come off her pain medications.

## 2019-05-02 ENCOUNTER — Telehealth: Payer: Self-pay | Admitting: *Deleted

## 2019-05-02 DIAGNOSIS — N3011 Interstitial cystitis (chronic) with hematuria: Secondary | ICD-10-CM | POA: Diagnosis not present

## 2019-05-02 NOTE — Telephone Encounter (Signed)
Error

## 2019-05-02 NOTE — Telephone Encounter (Signed)
Patient called back asking or [name] to discuss pain meds

## 2019-05-02 NOTE — Telephone Encounter (Signed)
Return [name] call, no answer. Left message to return the call.

## 2019-05-09 ENCOUNTER — Telehealth: Payer: Self-pay | Admitting: Registered Nurse

## 2019-05-09 DIAGNOSIS — M47816 Spondylosis without myelopathy or radiculopathy, lumbar region: Secondary | ICD-10-CM

## 2019-05-09 DIAGNOSIS — N3011 Interstitial cystitis (chronic) with hematuria: Secondary | ICD-10-CM | POA: Diagnosis not present

## 2019-05-09 DIAGNOSIS — M797 Fibromyalgia: Secondary | ICD-10-CM

## 2019-05-09 MED ORDER — HYDROCODONE-ACETAMINOPHEN 7.5-325 MG PO TABS: 1.0000 | ORAL_TABLET | Freq: Three times a day (TID) | ORAL | 0 refills | Status: DC | PRN

## 2019-05-09 NOTE — Telephone Encounter (Signed)
[name] [name] called office stating she wants to resumed her hydrocodone, her pain has increased in intensity. Her appointment was changed to December. Her daughter verbalizes understanding.

## 2019-05-09 NOTE — Telephone Encounter (Signed)
Mrs Kojima was with her daughter who made the call to our office and she is requesting a refill on her pain medication. She has called multiple times but the return calls have been going to her husbands number and she is not getting the call.  Her number to be called is the [phone]. I have changed it to be the main number so all calls will go to her.  Please refill her hydrocodone.

## 2019-05-15 DIAGNOSIS — Z7189 Other specified counseling: Secondary | ICD-10-CM | POA: Diagnosis not present

## 2019-05-16 DIAGNOSIS — N390 Urinary tract infection, site not specified: Secondary | ICD-10-CM | POA: Diagnosis not present

## 2019-05-17 DIAGNOSIS — F3132 Bipolar disorder, current episode depressed, moderate: Secondary | ICD-10-CM | POA: Diagnosis not present

## 2019-05-17 DIAGNOSIS — F4310 Post-traumatic stress disorder, unspecified: Secondary | ICD-10-CM | POA: Diagnosis not present

## 2019-05-17 DIAGNOSIS — R69 Illness, unspecified: Secondary | ICD-10-CM | POA: Diagnosis not present

## 2019-05-17 DIAGNOSIS — F411 Generalized anxiety disorder: Secondary | ICD-10-CM | POA: Diagnosis not present

## 2019-05-17 DIAGNOSIS — F5105 Insomnia due to other mental disorder: Secondary | ICD-10-CM | POA: Diagnosis not present

## 2019-05-23 DIAGNOSIS — N3011 Interstitial cystitis (chronic) with hematuria: Secondary | ICD-10-CM | POA: Diagnosis not present

## 2019-05-28 DIAGNOSIS — Z7189 Other specified counseling: Secondary | ICD-10-CM | POA: Diagnosis not present

## 2019-05-28 DIAGNOSIS — Z0000 Encounter for general adult medical examination without abnormal findings: Secondary | ICD-10-CM | POA: Diagnosis not present

## 2019-05-30 DIAGNOSIS — N3011 Interstitial cystitis (chronic) with hematuria: Secondary | ICD-10-CM | POA: Diagnosis not present

## 2019-06-18 DIAGNOSIS — F411 Generalized anxiety disorder: Secondary | ICD-10-CM | POA: Diagnosis not present

## 2019-06-18 DIAGNOSIS — R69 Illness, unspecified: Secondary | ICD-10-CM | POA: Diagnosis not present

## 2019-07-01 ENCOUNTER — Other Ambulatory Visit: Payer: Self-pay

## 2019-07-01 ENCOUNTER — Encounter: Payer: Self-pay | Admitting: Registered Nurse

## 2019-07-01 ENCOUNTER — Encounter: Payer: Medicare HMO | Attending: Physical Medicine and Rehabilitation | Admitting: Registered Nurse

## 2019-07-01 DIAGNOSIS — M7918 Myalgia, other site: Secondary | ICD-10-CM | POA: Diagnosis not present

## 2019-07-01 DIAGNOSIS — M47817 Spondylosis without myelopathy or radiculopathy, lumbosacral region: Secondary | ICD-10-CM | POA: Diagnosis not present

## 2019-07-01 DIAGNOSIS — G8929 Other chronic pain: Secondary | ICD-10-CM

## 2019-07-01 DIAGNOSIS — M19011 Primary osteoarthritis, right shoulder: Secondary | ICD-10-CM | POA: Insufficient documentation

## 2019-07-01 DIAGNOSIS — M546 Pain in thoracic spine: Secondary | ICD-10-CM | POA: Diagnosis not present

## 2019-07-01 DIAGNOSIS — Z5181 Encounter for therapeutic drug level monitoring: Secondary | ICD-10-CM | POA: Diagnosis not present

## 2019-07-01 DIAGNOSIS — M47816 Spondylosis without myelopathy or radiculopathy, lumbar region: Secondary | ICD-10-CM | POA: Insufficient documentation

## 2019-07-01 DIAGNOSIS — M2550 Pain in unspecified joint: Secondary | ICD-10-CM | POA: Diagnosis not present

## 2019-07-01 DIAGNOSIS — Z79891 Long term (current) use of opiate analgesic: Secondary | ICD-10-CM | POA: Diagnosis not present

## 2019-07-01 DIAGNOSIS — Z79899 Other long term (current) drug therapy: Secondary | ICD-10-CM | POA: Insufficient documentation

## 2019-07-01 DIAGNOSIS — G894 Chronic pain syndrome: Secondary | ICD-10-CM | POA: Diagnosis not present

## 2019-07-01 DIAGNOSIS — M797 Fibromyalgia: Secondary | ICD-10-CM | POA: Diagnosis not present

## 2019-07-01 MED ORDER — HYDROCODONE-ACETAMINOPHEN 7.5-325 MG PO TABS: 1.0000 | ORAL_TABLET | Freq: Three times a day (TID) | ORAL | 0 refills | Status: DC | PRN

## 2019-07-01 MED ORDER — GABAPENTIN 300 MG PO CAPS: 300.0000 mg | ORAL_CAPSULE | Freq: Three times a day (TID) | ORAL | 2 refills | Status: DC

## 2019-07-01 NOTE — Progress Notes (Signed)
Subjective:    Patient ID: [name] [name], female    DOB: October 23, 1945, 73 y.o.   MRN: [id number]  HPI: [name] [name] is a 73 y.o. female whose appointment was changed to a virtual office visit to reduce the risk of exposure to the COVID-19 virus and to help [name] remain healthy and safe. The virtual visit will also provide continuity of care. [name] understanding and agrees to virtual visit. She states her pain is located in her mid- back mainly right and upper- lower back on the left side radiating into her left lower extremity. Also reports generalized joint pain all over. She rates her pain 7. Her current exercise regime is walking and performing stretching exercises.  Ms. Writer Morphine equivalent is 22.50 MME. She  is also prescribed Clonazepam by Dr. Jessy Oto. We have discussed the black box warning of using opioids and benzodiazepines. I highlighted the dangers of using these drugs together and discussed the adverse events including respiratory suppression, overdose, cognitive impairment and importance of compliance with current regimen. We will continue to monitor and adjust as indicated.  She is being closely monitored and under the care of her psychiatrist Dr. Jessy Oto.  Last UDS was Performed on 08/20/2018, it was consistent.    Pain Inventory Average Pain 6 Pain Right Now 7 My pain is constant and hurts and burns  In the last 24 hours, has pain interfered with the following? General activity 7 Relation with others 4 Enjoyment of life 5 What TIME of day is your pain at its worst? night Sleep (in general) Fair  Pain is worse with: walking, inactivity and some activites Pain improves with: therapy/exercise and medication Relief from Meds: 7  Mobility use a cane ability to climb steps?  yes do you drive?  no  Function not employed: date last employed . I need assistance with the following:  household duties and shopping  Neuro/Psych bladder control  problems weakness tingling depression anxiety  Prior Studies Any changes since last visit?  no  Physicians involved in your care Any changes since last visit?  no   Family History  Problem Relation Age of Onset  . Cancer Other        breast -- grandmother  . Cancer Other        lung -- uncles (3)  . Leukemia Sister   . Diabetes Father   . Coronary artery disease Father   . Diabetes Mother    Social History   Socioeconomic History  . Marital status: Married    Spouse name: Not on file  . Number of children: Not on file  . Years of education: Not on file  . Highest education level: Not on file  Occupational History  . Not on file  Tobacco Use  . Smoking status: Never Smoker  . Smokeless tobacco: Never Used  Substance and Sexual Activity  . Alcohol use: No  . Drug use: No  . Sexual activity: Not on file  Other Topics Concern  . Not on file  Social History Narrative  . Not on file   Social Determinants of Health   Financial Resource Strain:   . Difficulty of Paying Living Expenses: Not on file  Food Insecurity:   . Worried About Charity fundraiser in the Last Year: Not on file  . Ran Out of Food in the Last Year: Not on file  Transportation Needs:   . Lack of Transportation (Medical): Not on file  .  Lack of Transportation (Non-Medical): Not on file  Physical Activity:   . Days of Exercise per Week: Not on file  . Minutes of Exercise per Session: Not on file  Stress:   . Feeling of Stress : Not on file  Social Connections:   . Frequency of Communication with Friends and Family: Not on file  . Frequency of Social Gatherings with Friends and Family: Not on file  . Attends Religious Services: Not on file  . Active Member of Clubs or Organizations: Not on file  . Attends Archivist Meetings: Not on file  . Marital Status: Not on file   Past Surgical History:  Procedure Laterality Date  . CARDIAC CATHETERIZATION  01-17-2003  dr Darnell Level brodie    normal coronary arteries and LVF,  ef 60%  . EXCISION MORTON'S NEUROMA  2002   bilateral feet  . EXCISION OF BREAST BIOPSY Right 1990's   benign  . INTERSTIM IMPLANT PLACEMENT N/A 02/10/2015   Procedure: Barrie Lyme IMPLANT FIRST STAGE;  Surgeon: Bjorn Loser, MD;  Location: [facility];  Service: Urology;  Laterality: N/A;  . INTERSTIM IMPLANT PLACEMENT N/A 02/10/2015   Procedure: Barrie Lyme IMPLANT SECOND STAGE;  Surgeon: Bjorn Loser, MD;  Location: [facility];  Service: Urology;  Laterality: N/A;  . LAPAROSCOPIC CHOLECYSTECTOMY  2000  . NEGATIVE SLEEP STUDY  2000   per pt  . ORIF ANKLE FRACTURE  01/10/2012   Procedure: OPEN REDUCTION INTERNAL FIXATION (ORIF) ANKLE FRACTURE;  Surgeon: Sanjuana Kava, MD;  Location: [facility];  Service: Orthopedics;  Laterality: Left;  . TOTAL THYROIDECTOMY  1985   benign tumor  . TRANSTHORACIC ECHOCARDIOGRAM  03-02-2009   normal echo,  ef 55-60%  . VAGINAL HYSTERECTOMY  1984  . WRIST SURGERY Left 2000   Past Medical History:  Diagnosis Date  . Anxiety   . Cervical radiculopathy   . Chronic back pain    R > L  . Chronic neck pain   . Depression   . Dysphagia    functional --  takes small bites  . Fibromyalgia   . GERD (gastroesophageal reflux disease)   . Hemorrhoids   . History of ankle fracture 01/08/2012   Status post open reduction internal fixation of a left ankle trimalleolar fracture by Dr. Iona Hansen on 01/10/2012.    Marland Kitchen History of benign thyroid tumor   . History of diabetes mellitus, type II    was diet controlled -- per pt pcp she was no longer diabetic  . History of hiatal hernia   . History of TIA (transient ischemic attack)    02-27-2009  no residual  . HTN (hypertension)   . Hyperlipidemia   . Hypothyroidism, postsurgical   . Lumbar radicular pain    R>L legs  . Migraine headache   . Mild intermittent asthma with allergic rhinitis without complication   . PONV (postoperative nausea and  vomiting)   . Rotator cuff syndrome of right shoulder   . Urge urinary incontinence    refractory  . Wears glasses    There were no vitals taken for this visit.  Opioid Risk Score:   Fall Risk Score:  `1  Depression screen PHQ 2/9  Depression screen [facility] 2/9 07/01/2019 10/17/2018 06/22/2018 05/23/2018 03/19/2018 12/26/2017 04/14/2017  Decreased Interest 1 1 1 1 1 1 1   Down, Depressed, Hopeless 1 1 1 1 1 1 1   PHQ - 2 Score 2 2 2 2 2 2 2   Altered sleeping - - - - - - -  Tired, decreased energy - - - - - - -  Change in appetite - - - - - - -  Feeling bad or failure about yourself  - - - - - - -  Trouble concentrating - - - - - - -  Moving slowly or fidgety/restless - - - - - - -  Suicidal thoughts - - - - - - -  PHQ-9 Score - - - - - - -  Some recent data might be hidden   Review of Systems  Constitutional: Positive for diaphoresis and unexpected weight change.  HENT: Negative.   Eyes: Negative.   Respiratory: Positive for cough.   Cardiovascular: Positive for leg swelling.  Gastrointestinal: Positive for constipation.  Endocrine: Negative.   Genitourinary:       Bladder control  Musculoskeletal: Positive for arthralgias, back pain and myalgias.  Skin: Negative.   Allergic/Immunologic: Negative.   Neurological: Positive for weakness.       Tingling  Hematological: Negative.   Psychiatric/Behavioral: Positive for dysphoric mood. The patient is nervous/anxious.   All other systems reviewed and are negative.      Objective:   Physical Exam Vitals and nursing note reviewed.  Musculoskeletal:     Comments: No Physical Exam: Virtual Visit           Assessment & Plan:  1.History of fibromyalgia.Continue currentMedicationRegimen: HEP as Tolerated and Continue Gabapentin.07/01/2019 2. Chronicmidline thoracic back pain/low back pain/ Lumbosacral Spondylosis/ Lumbar Facet Arthropathy: Continue HEP as Tolerated. Continue to Monitor.07/01/2019 Refilled: Hydrocodone  7.5/325mg  one tablet three times a day as  needed for pain #80.  We will continue the opioid monitoring program, this consists of regular clinic visits, examinations, urine drug screen, pill counts as well as use of [place] Controlled Substance Reporting System. Continue Neurontin. 3.Cervicalgia/Cervical Radiculopathy:No complaints today.Continue withmedication regimen withGabapentin: 07/01/2019 4. Anxiety and depression.Continue Effexor andKlonopin Psychiatry Prescribing.07/01/2019 5. Left Ankle OA:No complaints today.Orthopedist Following.07/01/2019 6. Bilateral Chronic knee Pain:No complaints today.Continue HEP Program. Continue to monitor.07/01/2019 7. Bilateral Carpal Tunnel Syndrome: No complaints today. Continue wearing splints. Continue to monitor. 07/01/2019 8. Polyarthralgia: Continue to Alternate Heat and Ice Therapy. Continue with current medication regime. Continue to Monitor.07/01/2019  F/U in 1 month.   Virtual Visit Established Patient Location: In Her Home Time: 10 Minutes

## 2019-07-16 DIAGNOSIS — N3011 Interstitial cystitis (chronic) with hematuria: Secondary | ICD-10-CM | POA: Diagnosis not present

## 2019-07-16 DIAGNOSIS — R300 Dysuria: Secondary | ICD-10-CM | POA: Diagnosis not present

## 2019-07-22 ENCOUNTER — Other Ambulatory Visit: Payer: Self-pay

## 2019-07-22 ENCOUNTER — Ambulatory Visit: Payer: Medicare HMO | Admitting: Registered Nurse

## 2019-07-22 ENCOUNTER — Encounter: Payer: Medicare HMO | Attending: Physical Medicine and Rehabilitation | Admitting: Registered Nurse

## 2019-07-22 ENCOUNTER — Encounter: Payer: Self-pay | Admitting: Registered Nurse

## 2019-07-22 VITALS — BP 140/82 | Ht 66.0 in | Wt 208.0 lb

## 2019-07-22 DIAGNOSIS — G894 Chronic pain syndrome: Secondary | ICD-10-CM

## 2019-07-22 DIAGNOSIS — M5416 Radiculopathy, lumbar region: Secondary | ICD-10-CM

## 2019-07-22 DIAGNOSIS — M47817 Spondylosis without myelopathy or radiculopathy, lumbosacral region: Secondary | ICD-10-CM

## 2019-07-22 DIAGNOSIS — M797 Fibromyalgia: Secondary | ICD-10-CM

## 2019-07-22 DIAGNOSIS — Z79891 Long term (current) use of opiate analgesic: Secondary | ICD-10-CM | POA: Diagnosis not present

## 2019-07-22 DIAGNOSIS — Z79899 Other long term (current) drug therapy: Secondary | ICD-10-CM | POA: Insufficient documentation

## 2019-07-22 DIAGNOSIS — M545 Low back pain, unspecified: Secondary | ICD-10-CM

## 2019-07-22 DIAGNOSIS — M19011 Primary osteoarthritis, right shoulder: Secondary | ICD-10-CM | POA: Insufficient documentation

## 2019-07-22 DIAGNOSIS — M546 Pain in thoracic spine: Secondary | ICD-10-CM

## 2019-07-22 DIAGNOSIS — M7918 Myalgia, other site: Secondary | ICD-10-CM | POA: Diagnosis not present

## 2019-07-22 DIAGNOSIS — M2550 Pain in unspecified joint: Secondary | ICD-10-CM

## 2019-07-22 DIAGNOSIS — M47816 Spondylosis without myelopathy or radiculopathy, lumbar region: Secondary | ICD-10-CM | POA: Diagnosis not present

## 2019-07-22 DIAGNOSIS — Z5181 Encounter for therapeutic drug level monitoring: Secondary | ICD-10-CM | POA: Diagnosis not present

## 2019-07-22 DIAGNOSIS — G8929 Other chronic pain: Secondary | ICD-10-CM

## 2019-07-22 DIAGNOSIS — M25552 Pain in left hip: Secondary | ICD-10-CM

## 2019-07-22 MED ORDER — HYDROCODONE-ACETAMINOPHEN 7.5-325 MG PO TABS: 1.0000 | ORAL_TABLET | Freq: Three times a day (TID) | ORAL | 0 refills | Status: DC | PRN

## 2019-07-22 NOTE — Progress Notes (Signed)
Subjective:    Patient ID: [name] [name], female    DOB: July 27, 1945, 74 y.o.   MRN: [id number]  HPI: [name] [name] is a 74 y.o. female whose appointment was changed to a virtual office visit to reduce the risk of exposure to the COVID-19 virus and to help [name] [name] remain healthy and safe. The virtual visit will also provide continuity of care. [name] [name] agrees with tele-health visit and she verbalizes understanding.  She states her pain is located in her mid- lower back pain radiating into her left hip and left lower extremity. Also reports increase intensity of pain in her mid- lower back and left hip pain, we will order X-rays today, she verbalizes understanding. She rates her pain 7. Her current exercise regime is walking and performing stretching exercises.  [name] [name] Morphine equivalent is 23.08   MME.  She  is also prescribed Clonazepam by Dr. Jessy Oto. We have discussed the black box warning of using opioids and benzodiazepines. I highlighted the dangers of using these drugs together and discussed the adverse events including respiratory suppression, overdose, cognitive impairment and importance of compliance with current regimen. We will continue to monitor and adjust as indicated.   She  is being closely monitored and under the care of her psychiatrist Dr. Jessy Oto.    Last UDS was Performed on 08/20/2018, it was consistent.   Lillia Dallas CMA asked the Health and History Questions. This provider and [name] verified we were speaking with the correct person using two identifiers.   Pain Inventory Average Pain 8 Pain Right Now 7 My pain is constant and burning  In the last 24 hours, has pain interfered with the following? General activity 7 Relation with others 7 Enjoyment of life 7 What TIME of day is your pain at its worst? night Sleep (in general) Fair  Pain is worse with: walking, inactivity and some activites Pain improves with: therapy/exercise and medication Relief from  Meds: 7  Mobility use a cane ability to climb steps?  yes do you drive?  no  Function retired I need assistance with the following:  household duties and shopping  Neuro/Psych bladder control problems weakness tingling depression anxiety  Prior Studies   Physicians involved in your care Urologist   Family History  Problem Relation Age of Onset  . Cancer Other        breast -- grandmother  . Cancer Other        lung -- uncles (3)  . Leukemia Sister   . Diabetes Father   . Coronary artery disease Father   . Diabetes Mother    Social History   Socioeconomic History  . Marital status: Married    Spouse name: Not on file  . Number of children: Not on file  . Years of education: Not on file  . Highest education level: Not on file  Occupational History  . Not on file  Tobacco Use  . Smoking status: Never Smoker  . Smokeless tobacco: Never Used  Substance and Sexual Activity  . Alcohol use: No  . Drug use: No  . Sexual activity: Not on file  Other Topics Concern  . Not on file  Social History Narrative  . Not on file   Social Determinants of Health   Financial Resource Strain:   . Difficulty of Paying Living Expenses: Not on file  Food Insecurity:   . Worried About Charity fundraiser in the Last Year: Not on file  .  Ran Out of Food in the Last Year: Not on file  Transportation Needs:   . Lack of Transportation (Medical): Not on file  . Lack of Transportation (Non-Medical): Not on file  Physical Activity:   . Days of Exercise per Week: Not on file  . Minutes of Exercise per Session: Not on file  Stress:   . Feeling of Stress : Not on file  Social Connections:   . Frequency of Communication with Friends and Family: Not on file  . Frequency of Social Gatherings with Friends and Family: Not on file  . Attends Religious Services: Not on file  . Active Member of Clubs or Organizations: Not on file  . Attends Archivist Meetings: Not on file    . Marital Status: Not on file   Past Surgical History:  Procedure Laterality Date  . CARDIAC CATHETERIZATION  01-17-2003  dr Darnell Level brodie   normal coronary arteries and LVF,  ef 60%  . EXCISION MORTON'S NEUROMA  2002   bilateral feet  . EXCISION OF BREAST BIOPSY Right 1990's   benign  . INTERSTIM IMPLANT PLACEMENT N/A 02/10/2015   Procedure: Barrie Lyme IMPLANT FIRST STAGE;  Surgeon: Bjorn Loser, MD;  Location: [facility];  Service: Urology;  Laterality: N/A;  . INTERSTIM IMPLANT PLACEMENT N/A 02/10/2015   Procedure: Barrie Lyme IMPLANT SECOND STAGE;  Surgeon: Bjorn Loser, MD;  Location: [facility];  Service: Urology;  Laterality: N/A;  . LAPAROSCOPIC CHOLECYSTECTOMY  2000  . NEGATIVE SLEEP STUDY  2000   per pt  . ORIF ANKLE FRACTURE  01/10/2012   Procedure: OPEN REDUCTION INTERNAL FIXATION (ORIF) ANKLE FRACTURE;  Surgeon: Sanjuana Kava, MD;  Location: [facility];  Service: Orthopedics;  Laterality: Left;  . TOTAL THYROIDECTOMY  1985   benign tumor  . TRANSTHORACIC ECHOCARDIOGRAM  03-02-2009   normal echo,  ef 55-60%  . VAGINAL HYSTERECTOMY  1984  . WRIST SURGERY Left 2000   Past Medical History:  Diagnosis Date  . Anxiety   . Cervical radiculopathy   . Chronic back pain    R > L  . Chronic neck pain   . Depression   . Dysphagia    functional --  takes small bites  . Fibromyalgia   . GERD (gastroesophageal reflux disease)   . Hemorrhoids   . History of ankle fracture 01/08/2012   Status post open reduction internal fixation of a left ankle trimalleolar fracture by Dr. Iona Hansen on 01/10/2012.    Marland Kitchen History of benign thyroid tumor   . History of diabetes mellitus, type II    was diet controlled -- per pt pcp she was no longer diabetic  . History of hiatal hernia   . History of TIA (transient ischemic attack)    02-27-2009  no residual  . HTN (hypertension)   . Hyperlipidemia   . Hypothyroidism, postsurgical   . Lumbar radicular pain     R>L legs  . Migraine headache   . Mild intermittent asthma with allergic rhinitis without complication   . PONV (postoperative nausea and vomiting)   . Rotator cuff syndrome of right shoulder   . Urge urinary incontinence    refractory  . Wears glasses    BP 140/82   Ht 5\' 6"  (1.676 m)   Wt 208 lb (94.3 kg)   BMI 33.57 kg/m   Opioid Risk Score:   Fall Risk Score:  `1  Depression screen PHQ 2/9  Depression screen [facility] 2/9 07/01/2019 10/17/2018  06/22/2018 05/23/2018 03/19/2018 12/26/2017 04/14/2017  Decreased Interest 1 1 1 1 1 1 1   Down, Depressed, Hopeless 1 1 1 1 1 1 1   PHQ - 2 Score 2 2 2 2 2 2 2   Altered sleeping - - - - - - -  Tired, decreased energy - - - - - - -  Change in appetite - - - - - - -  Feeling bad or failure about yourself  - - - - - - -  Trouble concentrating - - - - - - -  Moving slowly or fidgety/restless - - - - - - -  Suicidal thoughts - - - - - - -  PHQ-9 Score - - - - - - -  Some recent data might be hidden    Review of Systems  Gastrointestinal: Positive for constipation.  Genitourinary: Positive for dyspareunia, frequency, pelvic pain and urgency. Negative for dysuria.  Musculoskeletal: Positive for gait problem and joint swelling.       Hip pain Knee pain Ankle pain  All other systems reviewed and are negative.      Objective:   Physical Exam Vitals and nursing note reviewed.  Musculoskeletal:     Comments: No Physical Exam: Virtual Visit           Assessment & Plan:  1.History of fibromyalgia.Continue currentMedicationRegimen: HEP as Tolerated and Continue Gabapentin.07/22/2019. 2. Chronicmidline thoracic back pain/low back pain/ Lumbosacral Spondylosis/ Lumbar Facet Arthropathy: Continue HEP as Tolerated. Continue to Monitor.07/22/2019 Refilled: Hydrocodone 7.5/325mg  one tabletthree times a day as  needed for pain #80.  We will continue the opioid monitoring program, this consists of regular clinic visits, examinations,  urine drug screen, pill counts as well as use of [place] Controlled Substance Reporting System. Continue Neurontin. 3.Cervicalgia/Cervical Radiculopathy:No complaints today.Continue withmedication regimen withGabapentin: 07/22/2019 4. Anxiety and depression.Continue Effexor andKlonopin Psychiatry Prescribing.07/22/2019 5. Left Ankle OA:No complaints today.Orthopedist Following.07/22/2019 6. Bilateral Chronic knee Pain:No complaints today.Continue HEP Program. Continue to monitor.07/22/2019 7. Bilateral Carpal Tunnel Syndrome: No complaints today. Continue wearing splints. Continue to monitor.07/22/2019. 8. Polyarthralgia: Continue to Alternate Heat and Ice Therapy. Continue with current medication regime. Continue to Monitor.07/22/2019 9. Acute Exacerbation of chronic low back pain/ Thoracic Back Pain: RX: Thoracic and Lumbar X-ray 10. Acute Left Hip Pain: X-ray   F/U in 1 month.   Virtual Visit Telephone Call Established Patient Location of Patient : In Her Home Location of Provider: In Office Time: 15 Minutes

## 2019-07-23 ENCOUNTER — Ambulatory Visit (HOSPITAL_COMMUNITY)
Admission: RE | Admit: 2019-07-23 | Discharge: 2019-07-23 | Disposition: A | Discharge status: Home / Self Care | Payer: Medicare HMO | Source: Ambulatory Visit | Attending: Registered Nurse | Admitting: Registered Nurse

## 2019-07-23 ENCOUNTER — Other Ambulatory Visit: Payer: Self-pay

## 2019-07-23 DIAGNOSIS — G8929 Other chronic pain: Secondary | ICD-10-CM | POA: Diagnosis present

## 2019-07-23 DIAGNOSIS — M25552 Pain in left hip: Secondary | ICD-10-CM | POA: Diagnosis not present

## 2019-07-23 DIAGNOSIS — M545 Low back pain: Secondary | ICD-10-CM | POA: Insufficient documentation

## 2019-07-23 DIAGNOSIS — M546 Pain in thoracic spine: Secondary | ICD-10-CM | POA: Diagnosis not present

## 2019-08-14 DIAGNOSIS — M62838 Other muscle spasm: Secondary | ICD-10-CM | POA: Diagnosis not present

## 2019-08-14 DIAGNOSIS — M47812 Spondylosis without myelopathy or radiculopathy, cervical region: Secondary | ICD-10-CM | POA: Diagnosis not present

## 2019-08-14 DIAGNOSIS — K219 Gastro-esophageal reflux disease without esophagitis: Secondary | ICD-10-CM | POA: Diagnosis not present

## 2019-08-14 DIAGNOSIS — G629 Polyneuropathy, unspecified: Secondary | ICD-10-CM | POA: Diagnosis not present

## 2019-08-14 DIAGNOSIS — Z79899 Other long term (current) drug therapy: Secondary | ICD-10-CM | POA: Diagnosis not present

## 2019-08-14 DIAGNOSIS — M129 Arthropathy, unspecified: Secondary | ICD-10-CM | POA: Diagnosis not present

## 2019-08-14 DIAGNOSIS — M4726 Other spondylosis with radiculopathy, lumbar region: Secondary | ICD-10-CM | POA: Diagnosis not present

## 2019-08-14 DIAGNOSIS — E559 Vitamin D deficiency, unspecified: Secondary | ICD-10-CM | POA: Diagnosis not present

## 2019-08-16 DIAGNOSIS — N3941 Urge incontinence: Secondary | ICD-10-CM | POA: Diagnosis not present

## 2019-08-16 DIAGNOSIS — R300 Dysuria: Secondary | ICD-10-CM | POA: Diagnosis not present

## 2019-08-16 DIAGNOSIS — N3011 Interstitial cystitis (chronic) with hematuria: Secondary | ICD-10-CM | POA: Diagnosis not present

## 2019-08-20 DIAGNOSIS — R7301 Impaired fasting glucose: Secondary | ICD-10-CM | POA: Diagnosis not present

## 2019-08-20 DIAGNOSIS — I10 Essential (primary) hypertension: Secondary | ICD-10-CM | POA: Diagnosis not present

## 2019-08-20 DIAGNOSIS — Z79899 Other long term (current) drug therapy: Secondary | ICD-10-CM | POA: Diagnosis not present

## 2019-08-20 DIAGNOSIS — F411 Generalized anxiety disorder: Secondary | ICD-10-CM | POA: Diagnosis not present

## 2019-08-20 DIAGNOSIS — Z0000 Encounter for general adult medical examination without abnormal findings: Secondary | ICD-10-CM | POA: Diagnosis not present

## 2019-08-20 DIAGNOSIS — Z23 Encounter for immunization: Secondary | ICD-10-CM | POA: Diagnosis not present

## 2019-08-20 DIAGNOSIS — R69 Illness, unspecified: Secondary | ICD-10-CM | POA: Diagnosis not present

## 2019-08-20 DIAGNOSIS — F5105 Insomnia due to other mental disorder: Secondary | ICD-10-CM | POA: Diagnosis not present

## 2019-08-20 DIAGNOSIS — E782 Mixed hyperlipidemia: Secondary | ICD-10-CM | POA: Diagnosis not present

## 2019-08-20 DIAGNOSIS — F4310 Post-traumatic stress disorder, unspecified: Secondary | ICD-10-CM | POA: Diagnosis not present

## 2019-08-20 DIAGNOSIS — E039 Hypothyroidism, unspecified: Secondary | ICD-10-CM | POA: Diagnosis not present

## 2019-08-20 DIAGNOSIS — F3131 Bipolar disorder, current episode depressed, mild: Secondary | ICD-10-CM | POA: Diagnosis not present

## 2019-08-27 ENCOUNTER — Ambulatory Visit: Payer: Medicare HMO | Admitting: Registered Nurse

## 2019-08-29 DIAGNOSIS — F411 Generalized anxiety disorder: Secondary | ICD-10-CM | POA: Diagnosis not present

## 2019-08-29 DIAGNOSIS — R69 Illness, unspecified: Secondary | ICD-10-CM | POA: Diagnosis not present

## 2019-09-11 DIAGNOSIS — M62838 Other muscle spasm: Secondary | ICD-10-CM | POA: Diagnosis not present

## 2019-09-11 DIAGNOSIS — Z79899 Other long term (current) drug therapy: Secondary | ICD-10-CM | POA: Diagnosis not present

## 2019-09-11 DIAGNOSIS — M4726 Other spondylosis with radiculopathy, lumbar region: Secondary | ICD-10-CM | POA: Diagnosis not present

## 2019-09-11 DIAGNOSIS — G629 Polyneuropathy, unspecified: Secondary | ICD-10-CM | POA: Diagnosis not present

## 2019-09-11 DIAGNOSIS — M47812 Spondylosis without myelopathy or radiculopathy, cervical region: Secondary | ICD-10-CM | POA: Diagnosis not present

## 2019-09-20 DIAGNOSIS — M546 Pain in thoracic spine: Secondary | ICD-10-CM | POA: Diagnosis not present

## 2019-09-20 DIAGNOSIS — M545 Low back pain: Secondary | ICD-10-CM | POA: Diagnosis not present

## 2019-09-20 DIAGNOSIS — R251 Tremor, unspecified: Secondary | ICD-10-CM | POA: Diagnosis not present

## 2019-09-26 ENCOUNTER — Other Ambulatory Visit: Payer: Self-pay | Admitting: Orthopedic Surgery

## 2019-09-26 DIAGNOSIS — M545 Low back pain, unspecified: Secondary | ICD-10-CM

## 2019-10-02 DIAGNOSIS — Z0100 Encounter for examination of eyes and vision without abnormal findings: Secondary | ICD-10-CM | POA: Diagnosis not present

## 2019-10-03 DIAGNOSIS — R69 Illness, unspecified: Secondary | ICD-10-CM | POA: Diagnosis not present

## 2019-10-03 DIAGNOSIS — F411 Generalized anxiety disorder: Secondary | ICD-10-CM | POA: Diagnosis not present

## 2019-10-11 ENCOUNTER — Ambulatory Visit
Admission: RE | Admit: 2019-10-11 | Discharge: 2019-10-11 | Disposition: A | Discharge status: Home / Self Care | Payer: Medicare HMO | Source: Ambulatory Visit | Attending: Orthopedic Surgery | Admitting: Orthopedic Surgery

## 2019-10-11 DIAGNOSIS — M5126 Other intervertebral disc displacement, lumbar region: Secondary | ICD-10-CM | POA: Diagnosis not present

## 2019-10-11 DIAGNOSIS — M545 Low back pain, unspecified: Secondary | ICD-10-CM

## 2019-10-11 DIAGNOSIS — M79604 Pain in right leg: Secondary | ICD-10-CM | POA: Diagnosis not present

## 2019-10-14 DIAGNOSIS — M62838 Other muscle spasm: Secondary | ICD-10-CM | POA: Diagnosis not present

## 2019-10-14 DIAGNOSIS — Z79899 Other long term (current) drug therapy: Secondary | ICD-10-CM | POA: Diagnosis not present

## 2019-10-14 DIAGNOSIS — M4726 Other spondylosis with radiculopathy, lumbar region: Secondary | ICD-10-CM | POA: Diagnosis not present

## 2019-10-14 DIAGNOSIS — M47812 Spondylosis without myelopathy or radiculopathy, cervical region: Secondary | ICD-10-CM | POA: Diagnosis not present

## 2019-10-14 DIAGNOSIS — M549 Dorsalgia, unspecified: Secondary | ICD-10-CM | POA: Diagnosis not present

## 2019-10-21 DIAGNOSIS — M5136 Other intervertebral disc degeneration, lumbar region: Secondary | ICD-10-CM | POA: Diagnosis not present

## 2019-10-21 DIAGNOSIS — M479 Spondylosis, unspecified: Secondary | ICD-10-CM | POA: Diagnosis not present

## 2019-10-21 DIAGNOSIS — M545 Low back pain: Secondary | ICD-10-CM | POA: Diagnosis not present

## 2019-10-22 ENCOUNTER — Ambulatory Visit: Payer: Medicare HMO | Admitting: Neurology

## 2019-10-22 ENCOUNTER — Encounter: Payer: Self-pay | Admitting: Neurology

## 2019-10-22 ENCOUNTER — Other Ambulatory Visit: Payer: Self-pay

## 2019-10-22 VITALS — BP 110/80 | HR 65 | Temp 97.0°F | Ht 65.5 in | Wt 208.0 lb

## 2019-10-22 DIAGNOSIS — Z9181 History of falling: Secondary | ICD-10-CM | POA: Diagnosis not present

## 2019-10-22 DIAGNOSIS — R269 Unspecified abnormalities of gait and mobility: Secondary | ICD-10-CM

## 2019-10-22 DIAGNOSIS — R251 Tremor, unspecified: Secondary | ICD-10-CM | POA: Diagnosis not present

## 2019-10-22 MED ORDER — PRIMIDONE 50 MG PO TABS: 50.0000 mg | ORAL_TABLET | Freq: Three times a day (TID) | ORAL | 3 refills | Status: DC

## 2019-10-22 NOTE — Progress Notes (Signed)
[facility]  PATIENT: [name] [name] DOB: 01-10-1946  REFERRING DOCTOR OR PCP: Vicenta Aly, FNP SOURCE: Patient, notes from primary care, notes in epic, information about InterStim device, MRI of the cervical spine from 2013  _________________________________   HISTORICAL  CHIEF COMPLAINT:  Chief Complaint  Patient presents with  . New Patient (Initial Visit)    RM 12, alone. Paper referral from Vicenta Aly, FNP for right hand tremor/body shakes. Ambulates with cane. She has had these sx for the last 4 months.     HISTORY OF PRESENT ILLNESS:  I had the pleasure of seeing patient, [name] [name], at [facility] for neurologic consultation regarding her tremor/shakes and worsening gait.  She is a 74 year old woman who noted tremors in her right > left hand and more difficulty with gait.    She notes fine tasks like painting her nails, keyboarding holding a utensil bring the tremor out more.    She notes shaking in her head and neck and sometimes in her body when she wakes up.    She notes upper body jerks when she is drowsy.   She also notes facial movements when she is tired.    She notes reduced gait.   She feels balance is more of a problem than strength.  She hits the walls at times as she walks and notes falling more to the right.    She ha no recent falls.   Steps are normal sized.   She feels her turning is more off balanced..    She has a bladder stimulation device and notes frequency.    Currently sees Dr. Harlow Asa in [facility] and Dr. Matilde Sprang at [place].     She has the [employer]  A couple years ago, she reports a fall in [place] hurting her left side.   She fell without any reason -- no slip or trip.       I reviewed the report and images from the MRI of the cervical spine from 08/23/2011.  There are degenerative changes, worse at C6-C7 but no spinal cord changes or significant spinal stenosis  REVIEW OF  SYSTEMS: Constitutional: She reports fevers, chills, weight gain, fatigue, insomnia Eyes: She reports eye pain. Ear, nose and throat: She reports hearing loss with tinnitus on the left. Cardiovascular: No chest pain, palpitations Respiratory:She reports shortness of breath, cough and snoring GastrointestinaI: No nausea, vomiting, diarrhea, abdominal pain, fecal incontinence Genitourinary:She has incontinence and has an InterStim device.  Musculoskeletal:She reports neck pain, back pain, myalgias and joint pain. Integumentary: She reports pruritus and mild.   Neurological: as above Psychiatric: She sees psychiatry for bipolar disorder. Endocrine: She reports feeling hot or cold at times and increased thirst. Hematologic/Lymphatic: No anemia, purpura, petechiae. Allergic/Immunologic: No itchy/runny eyes, nasal congestion, recent allergic reactions, rashes  ALLERGIES: Allergies  Allergen Reactions  . Diclofenac Sodium Anaphylaxis and Swelling    "almost died" caused neck to swell.   . Diclofenac Sodium Anaphylaxis and Swelling    "almost died" caused neck to swell.   . Nsaids Anaphylaxis  . Penicillins Swelling and Rash    Did it involve swelling of the face/tongue/throat, SOB, or low BP? Y Did it involve sudden or severe rash/hives, skin peeling, or any reaction on the inside of your mouth or nose? Y Did you need to seek medical attention at a hospital or doctor's office? n When did it last happen?more than 10 years ago If all above answers are "NO", may  proceed with cephalosporin use.   . Relafen [Nabumetone] Anaphylaxis and Swelling  . Other Other (See Comments) and Swelling    Honey causes throat to swell. Strawberries cause a rash.  "turnes red, swells, and pulls skin off" "turnes red, swells, and pulls skin off"  . Adhesive [Tape] Swelling    "turnes red, swells, and pulls skin off"  . Codeine Nausea And Vomiting    severe  . Ex-Lax [Senna] Other (See Comments)    Pt  states "my fingers turned black"  . Trazodone And Nefazodone Other (See Comments)    Possible akathisia  . Neosporin [Neomycin-Bacitracin Zn-Polymyx] Rash    HOME MEDICATIONS:  Current Outpatient Medications:  .  albuterol (PROVENTIL HFA;VENTOLIN HFA) 108 (90 Base) MCG/ACT inhaler, Inhale 2 puffs into the lungs every 4 (four) hours as needed for wheezing or shortness of breath. , Disp: , Rfl:  .  aspirin 81 MG chewable tablet, Chew 81 mg by mouth daily., Disp: , Rfl:  .  azelastine (ASTELIN) 0.1 % nasal spray, Place 2 sprays into both nostrils 2 (two) times daily. , Disp: , Rfl:  .  ciprofloxacin (CIPRO) 500 MG tablet, Take 500 mg by mouth 2 (two) times daily., Disp: , Rfl:  .  clonazePAM (KLONOPIN) 0.5 MG tablet, Take 0.5 mg by mouth daily as needed for anxiety., Disp: , Rfl:  .  gabapentin (NEURONTIN) 300 MG capsule, Take 1 capsule (300 mg total) by mouth 3 (three) times daily., Disp: 90 capsule, Rfl: 2 .  HYDROcodone-acetaminophen (NORCO) 7.5-325 MG tablet, Take 1 tablet by mouth 3 (three) times daily as needed for moderate pain., Disp: 80 tablet, Rfl: 0 .  levothyroxine (SYNTHROID, LEVOTHROID) 100 MCG tablet, TAKE 1 TABLET(100 MCG) BY MOUTH DAILY BEFORE BREAKFAST (Patient taking differently: Take 100 mcg by mouth daily before breakfast. ), Disp: 30 tablet, Rfl: 5 .  losartan (COZAAR) 100 MG tablet, Take 100 mg by mouth daily. , Disp: , Rfl:  .  magnesium hydroxide (MILK OF MAGNESIA) 400 MG/5ML suspension, Take 30 mLs by mouth daily as needed for mild constipation., Disp: , Rfl:  .  metoprolol tartrate (LOPRESSOR) 25 MG tablet, Take 25 mg by mouth 2 (two) times daily. , Disp: , Rfl:  .  naloxone (NARCAN) nasal spray 4 mg/0.1 mL, Place into the nose. As directed, Disp: , Rfl:  .  omeprazole (PRILOSEC) 40 MG capsule, Take 1 capsule (40 mg total) by mouth 2 (two) times daily., Disp: 180 capsule, Rfl: 0 .  polyethylene glycol (MIRALAX / GLYCOLAX) packet, Take 17 g by mouth daily as needed for  mild constipation., Disp: , Rfl:  .  pravastatin (PRAVACHOL) 20 MG tablet, TAKE 1 TABLET (20 MG TOTAL) BY MOUTH DAILY., Disp: 90 tablet, Rfl: 3 .  QUEtiapine Fumarate (SEROQUEL XR) 150 MG 24 hr tablet, Take 150 mg by mouth at bedtime., Disp: , Rfl:  .  venlafaxine (EFFEXOR) 100 MG tablet, TAKE ONE TABLET BY MOUTH DAILY THREE TIMES A DAY, Disp: 90 tablet, Rfl: 0 .  lamoTRIgine (LAMICTAL) 150 MG tablet, Take 1 tablet by mouth 2 (two) times daily. , Disp: , Rfl:  .  primidone (MYSOLINE) 50 MG tablet, Take 1 tablet (50 mg total) by mouth 3 (three) times daily., Disp: 90 tablet, Rfl: 3  PAST MEDICAL HISTORY: Past Medical History:  Diagnosis Date  . Anxiety   . Cervical radiculopathy   . Chronic back pain    R > L  . Chronic neck pain   . Depression   .  Dysphagia    functional --  takes small bites  . Fibromyalgia   . GERD (gastroesophageal reflux disease)   . Hemorrhoids   . History of ankle fracture 01/08/2012   Status post open reduction internal fixation of a left ankle trimalleolar fracture by Dr. Iona Hansen on 01/10/2012.    Marland Kitchen History of benign thyroid tumor   . History of diabetes mellitus, type II    was diet controlled -- per pt pcp she was no longer diabetic  . History of hiatal hernia   . History of TIA (transient ischemic attack)    02-27-2009  no residual  . HTN (hypertension)   . Hyperlipidemia   . Hypothyroidism, postsurgical   . Lumbar radicular pain    R>L legs  . Migraine headache   . Mild intermittent asthma with allergic rhinitis without complication   . PONV (postoperative nausea and vomiting)   . Rotator cuff syndrome of right shoulder   . Urge urinary incontinence    refractory  . Wears glasses     PAST SURGICAL HISTORY: Past Surgical History:  Procedure Laterality Date  . CARDIAC CATHETERIZATION  01-17-2003  dr Darnell Level brodie   normal coronary arteries and LVF,  ef 60%  . EXCISION MORTON'S NEUROMA  2002   bilateral feet  . EXCISION OF BREAST  BIOPSY Right 1990's   benign  . INTERSTIM IMPLANT PLACEMENT N/A 02/10/2015   Procedure: Barrie Lyme IMPLANT FIRST STAGE;  Surgeon: Bjorn Loser, MD;  Location: [facility];  Service: Urology;  Laterality: N/A;  . INTERSTIM IMPLANT PLACEMENT N/A 02/10/2015   Procedure: Barrie Lyme IMPLANT SECOND STAGE;  Surgeon: Bjorn Loser, MD;  Location: [facility];  Service: Urology;  Laterality: N/A;  . LAPAROSCOPIC CHOLECYSTECTOMY  2000  . NEGATIVE SLEEP STUDY  2000   per pt  . ORIF ANKLE FRACTURE  01/10/2012   Procedure: OPEN REDUCTION INTERNAL FIXATION (ORIF) ANKLE FRACTURE;  Surgeon: Sanjuana Kava, MD;  Location: [facility];  Service: Orthopedics;  Laterality: Left;  . TOTAL THYROIDECTOMY  1985   benign tumor  . TRANSTHORACIC ECHOCARDIOGRAM  03-02-2009   normal echo,  ef 55-60%  . VAGINAL HYSTERECTOMY  1984  . WRIST SURGERY Left 2000    FAMILY HISTORY: Family History  Problem Relation Age of Onset  . Cancer Other        breast -- grandmother  . Cancer Other        lung -- uncles (3)  . Leukemia Sister   . Diabetes Father   . Coronary artery disease Father   . Diabetes Mother   . Dementia Mother     SOCIAL HISTORY:  Social History   Socioeconomic History  . Marital status: Married    Spouse name: Dior Schieffer  . Number of children: 2  . Years of education: 56  . Highest education level: Not on file  Occupational History  . Occupation: Retired  Tobacco Use  . Smoking status: Never Smoker  . Smokeless tobacco: Never Used  Substance and Sexual Activity  . Alcohol use: No  . Drug use: No  . Sexual activity: Not on file  Other Topics Concern  . Not on file  Social History Narrative   Lives with Irianna Messano (husband) and Francene Finders (grandson)   Caffeine use: 7 cups per day   Social Determinants of Health   Financial Resource Strain:   . Difficulty of Paying Living Expenses:   Food Insecurity:   . Worried About Estate manager/land agent  of Food in the Last  Year:   . [place] in the Last Year:   Transportation Needs:   . Lack of Transportation (Medical):   Marland Kitchen Lack of Transportation (Non-Medical):   Physical Activity:   . Days of Exercise per Week:   . Minutes of Exercise per Session:   Stress:   . Feeling of Stress :   Social Connections:   . Frequency of Communication with Friends and Family:   . Frequency of Social Gatherings with Friends and Family:   . Attends Religious Services:   . Active Member of Clubs or Organizations:   . Attends Archivist Meetings:   Marland Kitchen Marital Status:   Intimate Partner Violence:   . Fear of Current or Ex-Partner:   . Emotionally Abused:   Marland Kitchen Physically Abused:   . Sexually Abused:      PHYSICAL EXAM  Vitals:   10/22/19 1036  BP: 110/80  Pulse: 65  Temp: (!) 97 F (36.1 C)  SpO2: 98%  Weight: 208 lb (94.3 kg)  Height: 5' 5.5" (1.664 m)    Body mass index is 34.09 kg/m.   General: The patient is well-developed and well-nourished and in no acute distress  HEENT:  Head is Bruno/AT.  Sclera are anicteric.  Funduscopic exam shows normal optic discs and retinal vessels.  Neck: No carotid bruits are noted.  The neck is nontender.  Cardiovascular: The heart has a regular rate and rhythm with a normal S1 and S2. There were no murmurs, gallops or rubs.    Skin: Extremities are without rash or  edema.  Musculoskeletal:  Back is nontender  Neurologic Exam  Mental status: The patient is alert and oriented x 3 at the time of the examination. The patient has apparent normal recent and remote memory, with an apparently normal attention span and concentration ability.   Speech is normal.  Cranial nerves: Extraocular movements are full. Pupils are equal, round, and reactive to light and accomodation.  Visual fields are full.  Facial symmetry is present. There is good facial sensation to soft touch bilaterally.Facial strength is normal.  Trapezius and sternocleidomastoid strength is normal.  No dysarthria is noted.  The tongue is midline, and the patient has symmetric elevation of the soft palate. No obvious hearing deficits are noted.  Motor:  She has a fine tremor in hands, mildly worse with intention.  Spirals show some angulations.   Muscle bulk is normal.   Tone is normal. Strength is  5 / 5 in all 4 extremities.   Sensory: Sensory testing is intact to pinprick, soft touch and vibration sensation in the arms.  She reports mildly reduced vibration sensation in the feet.  Coordination: Cerebellar testing reveals good finger-nose-finger and heel-to-shin bilaterally.  Gait and station: Station is normal.   Gait is slightly wide. Tandem gait is wide . Romberg is negative.  She has retropulsion with posture disturbed.    Reflexes: Deep tendon reflexes are 3 at knees with crossed adductors and 2 at ankles .   Plantar responses are flexor.    DIAGNOSTIC DATA (LABS, IMAGING, TESTING) - I reviewed patient records, labs, notes, testing and imaging myself where available.  Lab Results  Component Value Date   WBC 5.7 07/25/2018   HGB 12.7 07/25/2018   HCT 41.0 07/25/2018   MCV 96.5 07/25/2018   PLT 313 07/25/2018      Component Value Date/Time   NA 137 07/25/2018 1417   NA 141 06/15/2015  0954   K 4.3 07/25/2018 1417   CL 102 07/25/2018 1417   CO2 26 07/25/2018 1417   GLUCOSE 106 (H) 07/25/2018 1417   BUN 15 07/25/2018 1417   BUN 15 06/15/2015 0954   CREATININE 0.74 07/25/2018 1417   CREATININE 0.67 09/18/2014 1145   CALCIUM 9.1 07/25/2018 1417   PROT 7.7 07/25/2018 1417   ALBUMIN 4.9 07/25/2018 1417   AST 20 07/25/2018 1417   ALT 21 07/25/2018 1417   ALKPHOS 98 07/25/2018 1417   BILITOT 0.7 07/25/2018 1417   GFRNONAA >60 07/25/2018 1417   GFRNONAA >89 09/18/2014 1145   GFRAA >60 07/25/2018 1417   GFRAA >89 09/18/2014 1145   Lab Results  Component Value Date   CHOL 170 09/18/2014   HDL 63 09/18/2014   LDLCALC 81 09/18/2014   TRIG 128 09/18/2014   CHOLHDL 2.7  09/18/2014   Lab Results  Component Value Date   HGBA1C 5.8 02/27/2015   Lab Results  Component Value Date   VITAMINB12 506 12/30/2009   Lab Results  Component Value Date   TSH 2.680 06/15/2015       ASSESSMENT AND PLAN  Gait disturbance  Tremor  Risk for falls  In summary, Ms. Devol is a 74 year old woman who has noted tremors over the last several months, occasional whole body shakes and also has had progressive worsening with her gait.  The tremor that she experiences is likely a mild benign essential tremor.  I will have her try low-dose Mysoline to see if it helps the intensity.  If she gets no benefit she should stop.  As she is also on hydrocodone, I would be reluctant to raise the dose of Mysoline further.  I am not sure what is causing her worsening gait but she does have retropulsion on exam.  I most concerned about the possibility of a cervical spine process.  Imaging is difficult because she has an InterStim device.  We spent a fair amount of time determining that her InterStim device can be used with in an MRI, as long as it is in MRI mode.  After reviewing online manuals, I was able to get into the menu of her device but she did not have an icon for "MRI mode" although all of the other features appear to be active.  She has an appointment to see urology next week and I have asked her to discuss this further with them.  We will only be able to do an MRI of the cervical spine if the InterStim device is able to be placed into MRI mode.  She will call back after her appointment if she is able to adjust the device to be MRI safe.  She will return to see me in 2 to 3 months or sooner for new or worsening neurologic symptoms.     Emillie Chasen A. Felecia Shelling, MD, [facility] [id number], [id number] PM Certified in Neurology, Clinical Neurophysiology, Sleep Medicine and Neuroimaging  [facility] [address] [place], [place] [address] [phone]

## 2019-10-23 DIAGNOSIS — I10 Essential (primary) hypertension: Secondary | ICD-10-CM | POA: Diagnosis not present

## 2019-10-23 DIAGNOSIS — R69 Illness, unspecified: Secondary | ICD-10-CM | POA: Diagnosis not present

## 2019-10-23 DIAGNOSIS — F411 Generalized anxiety disorder: Secondary | ICD-10-CM | POA: Diagnosis not present

## 2019-10-23 DIAGNOSIS — F4310 Post-traumatic stress disorder, unspecified: Secondary | ICD-10-CM | POA: Diagnosis not present

## 2019-10-23 DIAGNOSIS — F5105 Insomnia due to other mental disorder: Secondary | ICD-10-CM | POA: Diagnosis not present

## 2019-10-24 DIAGNOSIS — L981 Factitial dermatitis: Secondary | ICD-10-CM | POA: Diagnosis not present

## 2019-10-24 DIAGNOSIS — L281 Prurigo nodularis: Secondary | ICD-10-CM | POA: Diagnosis not present

## 2019-10-28 DIAGNOSIS — M5416 Radiculopathy, lumbar region: Secondary | ICD-10-CM | POA: Diagnosis not present

## 2019-10-31 DIAGNOSIS — M5416 Radiculopathy, lumbar region: Secondary | ICD-10-CM | POA: Diagnosis not present

## 2019-11-04 DIAGNOSIS — M5416 Radiculopathy, lumbar region: Secondary | ICD-10-CM | POA: Diagnosis not present

## 2019-11-05 DIAGNOSIS — R69 Illness, unspecified: Secondary | ICD-10-CM | POA: Diagnosis not present

## 2019-11-05 DIAGNOSIS — F411 Generalized anxiety disorder: Secondary | ICD-10-CM | POA: Diagnosis not present

## 2019-11-07 DIAGNOSIS — M5416 Radiculopathy, lumbar region: Secondary | ICD-10-CM | POA: Diagnosis not present

## 2019-11-11 DIAGNOSIS — Z79899 Other long term (current) drug therapy: Secondary | ICD-10-CM | POA: Diagnosis not present

## 2019-11-11 DIAGNOSIS — M47812 Spondylosis without myelopathy or radiculopathy, cervical region: Secondary | ICD-10-CM | POA: Diagnosis not present

## 2019-11-11 DIAGNOSIS — M62838 Other muscle spasm: Secondary | ICD-10-CM | POA: Diagnosis not present

## 2019-11-11 DIAGNOSIS — M4726 Other spondylosis with radiculopathy, lumbar region: Secondary | ICD-10-CM | POA: Diagnosis not present

## 2019-11-11 DIAGNOSIS — M129 Arthropathy, unspecified: Secondary | ICD-10-CM | POA: Diagnosis not present

## 2019-11-11 DIAGNOSIS — E559 Vitamin D deficiency, unspecified: Secondary | ICD-10-CM | POA: Diagnosis not present

## 2019-11-11 DIAGNOSIS — M549 Dorsalgia, unspecified: Secondary | ICD-10-CM | POA: Diagnosis not present

## 2019-11-13 DIAGNOSIS — M5416 Radiculopathy, lumbar region: Secondary | ICD-10-CM | POA: Diagnosis not present

## 2019-11-15 DIAGNOSIS — N3010 Interstitial cystitis (chronic) without hematuria: Secondary | ICD-10-CM | POA: Diagnosis not present

## 2019-11-15 DIAGNOSIS — R350 Frequency of micturition: Secondary | ICD-10-CM | POA: Diagnosis not present

## 2019-11-15 DIAGNOSIS — N390 Urinary tract infection, site not specified: Secondary | ICD-10-CM | POA: Diagnosis not present

## 2019-11-15 DIAGNOSIS — R309 Painful micturition, unspecified: Secondary | ICD-10-CM | POA: Diagnosis not present

## 2019-11-15 DIAGNOSIS — R300 Dysuria: Secondary | ICD-10-CM | POA: Diagnosis not present

## 2019-11-15 DIAGNOSIS — R3915 Urgency of urination: Secondary | ICD-10-CM | POA: Diagnosis not present

## 2019-11-15 DIAGNOSIS — R3129 Other microscopic hematuria: Secondary | ICD-10-CM | POA: Diagnosis not present

## 2019-11-20 DIAGNOSIS — M5416 Radiculopathy, lumbar region: Secondary | ICD-10-CM | POA: Diagnosis not present

## 2019-11-22 DIAGNOSIS — M5416 Radiculopathy, lumbar region: Secondary | ICD-10-CM | POA: Diagnosis not present

## 2019-11-27 DIAGNOSIS — M5416 Radiculopathy, lumbar region: Secondary | ICD-10-CM | POA: Diagnosis not present

## 2019-11-29 DIAGNOSIS — N3011 Interstitial cystitis (chronic) with hematuria: Secondary | ICD-10-CM | POA: Diagnosis not present

## 2019-12-02 DIAGNOSIS — M5416 Radiculopathy, lumbar region: Secondary | ICD-10-CM | POA: Diagnosis not present

## 2019-12-11 DIAGNOSIS — M4726 Other spondylosis with radiculopathy, lumbar region: Secondary | ICD-10-CM | POA: Diagnosis not present

## 2019-12-11 DIAGNOSIS — M62838 Other muscle spasm: Secondary | ICD-10-CM | POA: Diagnosis not present

## 2019-12-11 DIAGNOSIS — M549 Dorsalgia, unspecified: Secondary | ICD-10-CM | POA: Diagnosis not present

## 2019-12-11 DIAGNOSIS — Z79899 Other long term (current) drug therapy: Secondary | ICD-10-CM | POA: Diagnosis not present

## 2019-12-11 DIAGNOSIS — M47812 Spondylosis without myelopathy or radiculopathy, cervical region: Secondary | ICD-10-CM | POA: Diagnosis not present

## 2019-12-12 DIAGNOSIS — M5416 Radiculopathy, lumbar region: Secondary | ICD-10-CM | POA: Diagnosis not present

## 2019-12-17 DIAGNOSIS — M5416 Radiculopathy, lumbar region: Secondary | ICD-10-CM | POA: Diagnosis not present

## 2019-12-24 DIAGNOSIS — M5416 Radiculopathy, lumbar region: Secondary | ICD-10-CM | POA: Diagnosis not present

## 2019-12-26 ENCOUNTER — Telehealth: Payer: Self-pay | Admitting: Neurology

## 2019-12-26 ENCOUNTER — Other Ambulatory Visit: Payer: Self-pay

## 2019-12-26 ENCOUNTER — Ambulatory Visit: Payer: Medicare HMO | Admitting: Neurology

## 2019-12-26 ENCOUNTER — Encounter: Payer: Self-pay | Admitting: Neurology

## 2019-12-26 VITALS — BP 130/70 | HR 64 | Ht 65.0 in | Wt 206.0 lb

## 2019-12-26 DIAGNOSIS — R292 Abnormal reflex: Secondary | ICD-10-CM

## 2019-12-26 DIAGNOSIS — R269 Unspecified abnormalities of gait and mobility: Secondary | ICD-10-CM | POA: Diagnosis not present

## 2019-12-26 DIAGNOSIS — R251 Tremor, unspecified: Secondary | ICD-10-CM | POA: Diagnosis not present

## 2019-12-26 DIAGNOSIS — R3915 Urgency of urination: Secondary | ICD-10-CM | POA: Diagnosis not present

## 2019-12-26 DIAGNOSIS — Z9181 History of falling: Secondary | ICD-10-CM

## 2019-12-26 MED ORDER — CARBIDOPA-LEVODOPA 25-100 MG PO TABS: ORAL_TABLET | ORAL | 3 refills | Status: DC

## 2019-12-26 NOTE — Telephone Encounter (Signed)
[employer] medicare order sent to [facility]. They will obtain the auth and reach out to the patient to schedule.  °

## 2019-12-26 NOTE — Progress Notes (Signed)
[facility]  PATIENT: [name] [name] DOB: 01-Aug-1945  REFERRING DOCTOR OR PCP: Vicenta Aly, FNP SOURCE: Patient, notes from primary care, notes in epic, information about InterStim device, MRI of the cervical spine from 2013  _________________________________   HISTORICAL  CHIEF COMPLAINT:  Chief Complaint  Patient presents with  . Follow-up    Rm 13 alone here or 2 month f/u pt reports some improvement since last visit. Gabapentin is helping with her sx     HISTORY OF PRESENT ILLNESS:  [name] [name] is a 74 y.o. woman with tremor/shakes and worsening gait.   Update 12/26/2019:    She feels her tremors are getting worse.   She feels she is shaking more head to toe.  She lays down and takes a clonazepem when the tremor is worse. Mysoline was not well tolerated though the jerking in her shoulders was a little better.     She notes her gait is slightly better than the last visit --- but she still feels off balanced and veers to the right though.    She has had a few falls this year.    She is noting more fatigue.      She notes neck pain and stiffness.  She was unable to get an MRi She has an Interstim device and she reports it is just CT compatible not MRI compatible.     FROM 10/22/2019: She is a 74 year old woman who noted tremors in her right > left hand and more difficulty with gait.    She notes fine tasks like painting her nails, keyboarding holding a utensil bring the tremor out more.    She notes shaking in her head and neck and sometimes in her body when she wakes up.    She notes upper body jerks when she is drowsy.   She also notes facial movements when she is tired.    She notes reduced gait.   She feels balance is more of a problem than strength.  She hits the walls at times as she walks and notes falling more to the right.    She ha no recent falls.   Steps are normal sized.   She feels her turning is more off balanced..    She has a bladder  stimulation device and notes frequency.    Currently sees Dr. Harlow Asa in [facility] and Dr. Matilde Sprang at [place].     She has the [employer]  A couple years ago, she reports a fall in [place] hurting her left side.   She fell without any reason -- no slip or trip.       I reviewed the report and images from the MRI of the cervical spine from 08/23/2011.  There are degenerative changes, worse at C6-C7 but no spinal cord changes or significant spinal stenosis  REVIEW OF SYSTEMS: Constitutional: She reports fevers, chills, weight gain, fatigue, insomnia Eyes: She reports eye pain. Ear, nose and throat: She reports hearing loss with tinnitus on the left. Cardiovascular: No chest pain, palpitations Respiratory:She reports shortness of breath, cough and snoring GastrointestinaI: No nausea, vomiting, diarrhea, abdominal pain, fecal incontinence Genitourinary:She has incontinence and has an InterStim device.  Musculoskeletal:She reports neck pain, back pain, myalgias and joint pain. Integumentary: She reports pruritus and mild.   Neurological: as above Psychiatric: She sees psychiatry for bipolar disorder. Endocrine: She reports feeling hot or cold at times and increased thirst. Hematologic/Lymphatic: No anemia, purpura, petechiae. Allergic/Immunologic: No itchy/runny eyes,  nasal congestion, recent allergic reactions, rashes  ALLERGIES: Allergies  Allergen Reactions  . Diclofenac Sodium Anaphylaxis and Swelling    "almost died" caused neck to swell.   . Diclofenac Sodium Anaphylaxis and Swelling    "almost died" caused neck to swell.   . Nsaids Anaphylaxis  . Penicillins Swelling and Rash    Did it involve swelling of the face/tongue/throat, SOB, or low BP? Y Did it involve sudden or severe rash/hives, skin peeling, or any reaction on the inside of your mouth or nose? Y Did you need to seek medical attention at a hospital or doctor's office? n When did it last happen?more than 10  years ago If all above answers are "NO", may proceed with cephalosporin use.   . Relafen [Nabumetone] Anaphylaxis and Swelling  . Other Other (See Comments) and Swelling    Honey causes throat to swell. Strawberries cause a rash.  "turnes red, swells, and pulls skin off" "turnes red, swells, and pulls skin off"  . Adhesive [Tape] Swelling    "turnes red, swells, and pulls skin off"  . Codeine Nausea And Vomiting    severe  . Ex-Lax [Senna] Other (See Comments)    Pt states "my fingers turned black"  . Trazodone And Nefazodone Other (See Comments)    Possible akathisia  . Neosporin [Neomycin-Bacitracin Zn-Polymyx] Rash    HOME MEDICATIONS:  Current Outpatient Medications:  .  albuterol (PROVENTIL HFA;VENTOLIN HFA) 108 (90 Base) MCG/ACT inhaler, Inhale 2 puffs into the lungs every 4 (four) hours as needed for wheezing or shortness of breath. , Disp: , Rfl:  .  aspirin 81 MG chewable tablet, Chew 81 mg by mouth daily., Disp: , Rfl:  .  azelastine (ASTELIN) 0.1 % nasal spray, Place 2 sprays into both nostrils 2 (two) times daily. , Disp: , Rfl:  .  ciprofloxacin (CIPRO) 500 MG tablet, Take 500 mg by mouth 2 (two) times daily., Disp: , Rfl:  .  clonazePAM (KLONOPIN) 0.5 MG tablet, Take 0.5 mg by mouth daily as needed for anxiety., Disp: , Rfl:  .  gabapentin (NEURONTIN) 300 MG capsule, Take 1 capsule (300 mg total) by mouth 3 (three) times daily. (Patient taking differently: Take 300 mg by mouth 3 (three) times daily. 800 mg at bed time 4 400 mg 3 times a day), Disp: 90 capsule, Rfl: 2 .  HYDROcodone-acetaminophen (NORCO) 7.5-325 MG tablet, Take 1 tablet by mouth 3 (three) times daily as needed for moderate pain., Disp: 80 tablet, Rfl: 0 .  levothyroxine (SYNTHROID, LEVOTHROID) 100 MCG tablet, TAKE 1 TABLET(100 MCG) BY MOUTH DAILY BEFORE BREAKFAST (Patient taking differently: Take 100 mcg by mouth daily before breakfast. ), Disp: 30 tablet, Rfl: 5 .  losartan (COZAAR) 100 MG tablet, Take 100  mg by mouth daily. , Disp: , Rfl:  .  magnesium hydroxide (MILK OF MAGNESIA) 400 MG/5ML suspension, Take 30 mLs by mouth daily as needed for mild constipation., Disp: , Rfl:  .  metoprolol tartrate (LOPRESSOR) 25 MG tablet, Take 25 mg by mouth 2 (two) times daily. , Disp: , Rfl:  .  naloxone (NARCAN) nasal spray 4 mg/0.1 mL, Place into the nose. As directed, Disp: , Rfl:  .  omeprazole (PRILOSEC) 40 MG capsule, Take 1 capsule (40 mg total) by mouth 2 (two) times daily., Disp: 180 capsule, Rfl: 0 .  polyethylene glycol (MIRALAX / GLYCOLAX) packet, Take 17 g by mouth daily as needed for mild constipation., Disp: , Rfl:  .  pravastatin (PRAVACHOL) 20  MG tablet, TAKE 1 TABLET (20 MG TOTAL) BY MOUTH DAILY., Disp: 90 tablet, Rfl: 3 .  primidone (MYSOLINE) 50 MG tablet, Take 1 tablet (50 mg total) by mouth 3 (three) times daily., Disp: 90 tablet, Rfl: 3 .  QUEtiapine Fumarate (SEROQUEL XR) 150 MG 24 hr tablet, Take 150 mg by mouth at bedtime., Disp: , Rfl:  .  venlafaxine (EFFEXOR) 100 MG tablet, TAKE ONE TABLET BY MOUTH DAILY THREE TIMES A DAY, Disp: 90 tablet, Rfl: 0 .  carbidopa-levodopa (SINEMET IR) 25-100 MG tablet, One po tid, Disp: 90 tablet, Rfl: 3 .  lamoTRIgine (LAMICTAL) 150 MG tablet, Take 1 tablet by mouth 2 (two) times daily. , Disp: , Rfl:   PAST MEDICAL HISTORY: Past Medical History:  Diagnosis Date  . Anxiety   . Cervical radiculopathy   . Chronic back pain    R > L  . Chronic neck pain   . Depression   . Dysphagia    functional --  takes small bites  . Fibromyalgia   . GERD (gastroesophageal reflux disease)   . Hemorrhoids   . History of ankle fracture 01/08/2012   Status post open reduction internal fixation of a left ankle trimalleolar fracture by Dr. Iona Hansen on 01/10/2012.    Marland Kitchen History of benign thyroid tumor   . History of diabetes mellitus, type II    was diet controlled -- per pt pcp she was no longer diabetic  . History of hiatal hernia   . History of TIA  (transient ischemic attack)    02-27-2009  no residual  . HTN (hypertension)   . Hyperlipidemia   . Hypothyroidism, postsurgical   . Lumbar radicular pain    R>L legs  . Migraine headache   . Mild intermittent asthma with allergic rhinitis without complication   . PONV (postoperative nausea and vomiting)   . Rotator cuff syndrome of right shoulder   . Urge urinary incontinence    refractory  . Wears glasses     PAST SURGICAL HISTORY: Past Surgical History:  Procedure Laterality Date  . CARDIAC CATHETERIZATION  01-17-2003  dr Darnell Level brodie   normal coronary arteries and LVF,  ef 60%  . EXCISION MORTON'S NEUROMA  2002   bilateral feet  . EXCISION OF BREAST BIOPSY Right 1990's   benign  . INTERSTIM IMPLANT PLACEMENT N/A 02/10/2015   Procedure: Barrie Lyme IMPLANT FIRST STAGE;  Surgeon: Bjorn Loser, MD;  Location: [facility];  Service: Urology;  Laterality: N/A;  . INTERSTIM IMPLANT PLACEMENT N/A 02/10/2015   Procedure: Barrie Lyme IMPLANT SECOND STAGE;  Surgeon: Bjorn Loser, MD;  Location[facility];  Service: Urology;  Laterality: N/A;  . LAPAROSCOPIC CHOLECYSTECTOMY  2000  . NEGATIVE SLEEP STUDY  2000   per pt  . ORIF ANKLE FRACTURE  01/10/2012   Procedure: OPEN REDUCTION INTERNAL FIXATION (ORIF) ANKLE FRACTURE;  Surgeon: Sanjuana Kava, MD;  Location: [facility];  Service: Orthopedics;  Laterality: Left;  . TOTAL THYROIDECTOMY  1985   benign tumor  . TRANSTHORACIC ECHOCARDIOGRAM  03-02-2009   normal echo,  ef 55-60%  . VAGINAL HYSTERECTOMY  1984  . WRIST SURGERY Left 2000    FAMILY HISTORY: Family History  Problem Relation Age of Onset  . Cancer Other        breast -- grandmother  . Cancer Other        lung -- uncles (3)  . Leukemia Sister   . Diabetes Father   . Coronary artery  disease Father   . Diabetes Mother   . Dementia Mother     SOCIAL HISTORY:  Social History   Socioeconomic History  . Marital status: Married    Spouse  name: Jailah Willis  . Number of children: 2  . Years of education: 19  . Highest education level: Not on file  Occupational History  . Occupation: Retired  Tobacco Use  . Smoking status: Never Smoker  . Smokeless tobacco: Never Used  Substance and Sexual Activity  . Alcohol use: No  . Drug use: No  . Sexual activity: Not on file  Other Topics Concern  . Not on file  Social History Narrative   Lives with Tasia Liz (husband) and Francene Finders (grandson)   Caffeine use: 7 cups per day   Social Determinants of Health   Financial Resource Strain:   . Difficulty of Paying Living Expenses:   Food Insecurity:   . Worried About Charity fundraiser in the Last Year:   . Arboriculturist in the Last Year:   Transportation Needs:   . Film/video editor (Medical):   Marland Kitchen Lack of Transportation (Non-Medical):   Physical Activity:   . Days of Exercise per Week:   . Minutes of Exercise per Session:   Stress:   . Feeling of Stress :   Social Connections:   . Frequency of Communication with Friends and Family:   . Frequency of Social Gatherings with Friends and Family:   . Attends Religious Services:   . Active Member of Clubs or Organizations:   . Attends Archivist Meetings:   Marland Kitchen Marital Status:   Intimate Partner Violence:   . Fear of Current or Ex-Partner:   . Emotionally Abused:   Marland Kitchen Physically Abused:   . Sexually Abused:      PHYSICAL EXAM  Vitals:   12/26/19 1506  BP: 130/70  Pulse: 64  SpO2: 95%  Weight: 206 lb (93.4 kg)  Height: 5\' 5"  (1.651 m)    Body mass index is 34.28 kg/m.   General: The patient is well-developed and well-nourished and in no acute distress  HEENT:  Head is [place]/AT.  Sclera are anicteric.     Skin: Extremities are without rash or  edema.  Musculoskeletal:  Back is nontender  Neurologic Exam  Mental status: The patient is alert and oriented x 3 at the time of the examination. The patient has apparent normal recent and remote  memory, with an apparently normal attention span and concentration ability.   Speech is normal.  Cranial nerves: Extraocular movements are full   Facial symmetry is present. There is good facial sensation to soft touch bilaterally.Facial strength is normal.  Trapezius and sternocleidomastoid strength is normal. No dysarthria is noted.   . No obvious hearing deficits are noted.  Motor:  She has a fine tremor in hands, mildly worse with intention.  Spirals show some angulations.   Muscle bulk is normal.   Tone is normal. Strength is  5 / 5 in all 4 extremities.   Sensory: Sensory testing is intact to pinprick, soft touch and vibration sensation in the arms.  She reports mildly reduced vibration sensation in the feet.  Coordination: Cerebellar testing reveals good finger-nose-finger and heel-to-shin bilaterally.  Gait and station: Station is normal.   Gait is slightly wide. Tandem gait is wide .She can turn in 3 steps Romberg is negative.  She has retropulsion with posture disturbed.    Reflexes: Deep tendon  reflexes are 3 at knees but no crossed adductors today and 2 at ankles, no clonus .   Plantar responses are flexor.    DIAGNOSTIC DATA (LABS, IMAGING, TESTING) - I reviewed patient records, labs, notes, testing and imaging myself where available.  Lab Results  Component Value Date   WBC 5.7 07/25/2018   HGB 12.7 07/25/2018   HCT 41.0 07/25/2018   MCV 96.5 07/25/2018   PLT 313 07/25/2018      Component Value Date/Time   NA 137 07/25/2018 1417   NA 141 06/15/2015 0954   K 4.3 07/25/2018 1417   CL 102 07/25/2018 1417   CO2 26 07/25/2018 1417   GLUCOSE 106 (H) 07/25/2018 1417   BUN 15 07/25/2018 1417   BUN 15 06/15/2015 0954   CREATININE 0.74 07/25/2018 1417   CREATININE 0.67 09/18/2014 1145   CALCIUM 9.1 07/25/2018 1417   PROT 7.7 07/25/2018 1417   ALBUMIN 4.9 07/25/2018 1417   AST 20 07/25/2018 1417   ALT 21 07/25/2018 1417   ALKPHOS 98 07/25/2018 1417   BILITOT 0.7  07/25/2018 1417   GFRNONAA >60 07/25/2018 1417   GFRNONAA >89 09/18/2014 1145   GFRAA >60 07/25/2018 1417   GFRAA >89 09/18/2014 1145   Lab Results  Component Value Date   CHOL 170 09/18/2014   HDL 63 09/18/2014   LDLCALC 81 09/18/2014   TRIG 128 09/18/2014   CHOLHDL 2.7 09/18/2014   Lab Results  Component Value Date   HGBA1C 5.8 02/27/2015   Lab Results  Component Value Date   VITAMINB12 506 12/30/2009   Lab Results  Component Value Date   TSH 2.680 06/15/2015       ASSESSMENT AND PLAN  Tremor - Plan: CT HEAD WO CONTRAST  Gait disturbance - Plan: CT HEAD WO CONTRAST, CT CERVICAL SPINE WO CONTRAST  Hyperreflexia - Plan: CT CERVICAL SPINE WO CONTRAST  Risk for falls  Urinary urgency  1.   Tremor is unusual.  It is fairly rapid and she has no cogwheel rigidity c/w BET but she has significant postural instability, therefore I can;t r/o PD or parkinsonian syndrome.  She is already on metoprolol.   I'm reluctant to try clonazepam as she is already on hydrocodone.   I will have her do a trial of Sinemet to see if any benefit.   She should stop if no benefit.    2.   Due to gait imbalance, urinary urgency and mild hyperreflexia will check C-spine CT, head CT --- she is unable to have an MRI 3.   If no improvement and etiology uncertain, will advise her to see a movement disorder specialist.   4.   rtc 4 months   Cyra Spader A. Felecia Shelling, MD, [facility] 7/98/9211, 9:41 PM Certified in Neurology, Clinical Neurophysiology, Sleep Medicine and Neuroimaging  [facility] [address], [place], [place] [address] [phone]

## 2019-12-31 DIAGNOSIS — M5416 Radiculopathy, lumbar region: Secondary | ICD-10-CM | POA: Diagnosis not present

## 2020-01-08 DIAGNOSIS — M7712 Lateral epicondylitis, left elbow: Secondary | ICD-10-CM | POA: Diagnosis not present

## 2020-01-08 DIAGNOSIS — Z79899 Other long term (current) drug therapy: Secondary | ICD-10-CM | POA: Diagnosis not present

## 2020-01-08 DIAGNOSIS — M546 Pain in thoracic spine: Secondary | ICD-10-CM | POA: Diagnosis not present

## 2020-01-08 DIAGNOSIS — M7711 Lateral epicondylitis, right elbow: Secondary | ICD-10-CM | POA: Diagnosis not present

## 2020-01-08 DIAGNOSIS — M47812 Spondylosis without myelopathy or radiculopathy, cervical region: Secondary | ICD-10-CM | POA: Diagnosis not present

## 2020-01-17 DIAGNOSIS — R69 Illness, unspecified: Secondary | ICD-10-CM | POA: Diagnosis not present

## 2020-01-17 DIAGNOSIS — F411 Generalized anxiety disorder: Secondary | ICD-10-CM | POA: Diagnosis not present

## 2020-01-21 DIAGNOSIS — F411 Generalized anxiety disorder: Secondary | ICD-10-CM | POA: Diagnosis not present

## 2020-01-21 DIAGNOSIS — R69 Illness, unspecified: Secondary | ICD-10-CM | POA: Diagnosis not present

## 2020-01-21 DIAGNOSIS — F4310 Post-traumatic stress disorder, unspecified: Secondary | ICD-10-CM | POA: Diagnosis not present

## 2020-01-21 DIAGNOSIS — I10 Essential (primary) hypertension: Secondary | ICD-10-CM | POA: Diagnosis not present

## 2020-01-21 DIAGNOSIS — F5105 Insomnia due to other mental disorder: Secondary | ICD-10-CM | POA: Diagnosis not present

## 2020-01-22 ENCOUNTER — Ambulatory Visit
Admission: RE | Admit: 2020-01-22 | Discharge: 2020-01-22 | Disposition: A | Discharge status: Home / Self Care | Payer: Medicare HMO | Source: Ambulatory Visit | Attending: Neurology | Admitting: Neurology

## 2020-01-22 ENCOUNTER — Other Ambulatory Visit: Payer: Self-pay

## 2020-01-22 DIAGNOSIS — R251 Tremor, unspecified: Secondary | ICD-10-CM

## 2020-01-22 DIAGNOSIS — R269 Unspecified abnormalities of gait and mobility: Secondary | ICD-10-CM

## 2020-01-22 DIAGNOSIS — R292 Abnormal reflex: Secondary | ICD-10-CM

## 2020-01-22 DIAGNOSIS — M4802 Spinal stenosis, cervical region: Secondary | ICD-10-CM | POA: Diagnosis not present

## 2020-01-23 DIAGNOSIS — N3941 Urge incontinence: Secondary | ICD-10-CM | POA: Diagnosis not present

## 2020-02-07 DIAGNOSIS — Z79899 Other long term (current) drug therapy: Secondary | ICD-10-CM | POA: Diagnosis not present

## 2020-02-07 DIAGNOSIS — M47812 Spondylosis without myelopathy or radiculopathy, cervical region: Secondary | ICD-10-CM | POA: Diagnosis not present

## 2020-02-07 DIAGNOSIS — M7711 Lateral epicondylitis, right elbow: Secondary | ICD-10-CM | POA: Diagnosis not present

## 2020-02-07 DIAGNOSIS — M546 Pain in thoracic spine: Secondary | ICD-10-CM | POA: Diagnosis not present

## 2020-02-07 DIAGNOSIS — M7712 Lateral epicondylitis, left elbow: Secondary | ICD-10-CM | POA: Diagnosis not present

## 2020-02-20 DIAGNOSIS — L818 Other specified disorders of pigmentation: Secondary | ICD-10-CM | POA: Diagnosis not present

## 2020-02-20 DIAGNOSIS — L821 Other seborrheic keratosis: Secondary | ICD-10-CM | POA: Diagnosis not present

## 2020-02-20 DIAGNOSIS — L820 Inflamed seborrheic keratosis: Secondary | ICD-10-CM | POA: Diagnosis not present

## 2020-03-04 DIAGNOSIS — F5105 Insomnia due to other mental disorder: Secondary | ICD-10-CM | POA: Diagnosis not present

## 2020-03-04 DIAGNOSIS — I10 Essential (primary) hypertension: Secondary | ICD-10-CM | POA: Diagnosis not present

## 2020-03-04 DIAGNOSIS — F4310 Post-traumatic stress disorder, unspecified: Secondary | ICD-10-CM | POA: Diagnosis not present

## 2020-03-04 DIAGNOSIS — F411 Generalized anxiety disorder: Secondary | ICD-10-CM | POA: Diagnosis not present

## 2020-03-04 DIAGNOSIS — R69 Illness, unspecified: Secondary | ICD-10-CM | POA: Diagnosis not present

## 2020-03-05 DIAGNOSIS — R69 Illness, unspecified: Secondary | ICD-10-CM | POA: Diagnosis not present

## 2020-03-05 DIAGNOSIS — F411 Generalized anxiety disorder: Secondary | ICD-10-CM | POA: Diagnosis not present

## 2020-03-06 DIAGNOSIS — M7711 Lateral epicondylitis, right elbow: Secondary | ICD-10-CM | POA: Diagnosis not present

## 2020-03-06 DIAGNOSIS — M7712 Lateral epicondylitis, left elbow: Secondary | ICD-10-CM | POA: Diagnosis not present

## 2020-03-06 DIAGNOSIS — M47812 Spondylosis without myelopathy or radiculopathy, cervical region: Secondary | ICD-10-CM | POA: Diagnosis not present

## 2020-03-06 DIAGNOSIS — M546 Pain in thoracic spine: Secondary | ICD-10-CM | POA: Diagnosis not present

## 2020-03-06 DIAGNOSIS — Z79899 Other long term (current) drug therapy: Secondary | ICD-10-CM | POA: Diagnosis not present

## 2020-03-13 ENCOUNTER — Telehealth: Payer: Self-pay | Admitting: Neurology

## 2020-03-13 NOTE — Telephone Encounter (Signed)
Pt called wanting to discuss a new concern that has developed with RN. Pt states that she has been having issues walking for a long period of time recently. She is having to go home and lay down to recover. Please advise.

## 2020-03-17 NOTE — Telephone Encounter (Addendum)
Called [phone] and LVM asking pt call office back. Called [phone] and LVM for pt to call office back.

## 2020-03-17 NOTE — Telephone Encounter (Signed)
Dr. Felecia Shelling- please advise. I reviewed pt chart and saw your result note. Did you want to proceed with ordering a CT myelogram?

## 2020-03-17 NOTE — Telephone Encounter (Signed)
Please see if we can get her in sometime in the next week for further evaluation.

## 2020-03-18 NOTE — Telephone Encounter (Signed)
Called pt at [phone] . Was able to speak with her. Scheduled work in appt for 03/19/20 at [facility] with Dr. Felecia Shelling. Asked that she check in by 830am/845am. She verbalized understanding.

## 2020-03-19 ENCOUNTER — Encounter: Payer: Self-pay | Admitting: Neurology

## 2020-03-19 ENCOUNTER — Ambulatory Visit: Payer: Medicare HMO | Admitting: Neurology

## 2020-03-19 ENCOUNTER — Other Ambulatory Visit: Payer: Self-pay

## 2020-03-19 VITALS — BP 163/88 | HR 75 | Ht 65.0 in | Wt 210.0 lb

## 2020-03-19 DIAGNOSIS — Z9181 History of falling: Secondary | ICD-10-CM | POA: Diagnosis not present

## 2020-03-19 DIAGNOSIS — R251 Tremor, unspecified: Secondary | ICD-10-CM

## 2020-03-19 DIAGNOSIS — M4802 Spinal stenosis, cervical region: Secondary | ICD-10-CM | POA: Insufficient documentation

## 2020-03-19 DIAGNOSIS — R269 Unspecified abnormalities of gait and mobility: Secondary | ICD-10-CM | POA: Diagnosis not present

## 2020-03-19 DIAGNOSIS — R3915 Urgency of urination: Secondary | ICD-10-CM | POA: Diagnosis not present

## 2020-03-19 DIAGNOSIS — R69 Illness, unspecified: Secondary | ICD-10-CM | POA: Diagnosis not present

## 2020-03-19 NOTE — Progress Notes (Signed)
[facility]  PATIENT: [name] [name] DOB: [birth year]  REFERRING DOCTOR OR PCP: Vicenta Aly, FNP SOURCE: Patient, notes from primary care, notes in epic, information about InterStim device, MRI of the cervical spine from 2013  _________________________________   HISTORICAL  CHIEF COMPLAINT:  Chief Complaint  Patient presents with  . Follow-up    Rm 13, alone. Last seen 12/26/19. Having difficulty with walking longer distances.   . Gait Problem    Ambulates with cane. Fell last night but she was able to break her fall on her bed. She has no injuries from fall.    HISTORY OF PRESENT ILLNESS:  [name] [name] is a 74 y.o. woman with tremor/shakes and worsening gait.  Update March 19, 2020: She is still having difficulty with her gait.  She ambulates with a cane.  Balance is especiallypoor when she stands up.   Last night, she fell.  She did not injure herself and she was able to break the fall with the bed.  She has had 2 falls this month and one last month.   Gait difficulties are worse when she tries to go longer distances.  Tremors in the arms improved on carbidopa/levodopa 25/100 po tid.   Tremors are most noticeable around bedtime.  She notes more tremor in the mouth area.  Sometimes she feels she has trouble talking.     Due to an older Bladder stim device cannot get MRI.    CT cervical spine 7/15/02021 showed "Cervical disc and facet degeneration with moderate spinal stenosis at C5-6 and mild spinal stenosis at C3-4 and C6-7." and multilevel foraminal narrowing (moderate to the right at C3C4, C4C5, moderate to left at C6C7.      Tremor/gait history from early 10/22/2019 She noted tremors in her right > left hand and more difficulty with gait around age 17.    She notes fine tasks like painting her nails, keyboarding holding a utensil bring the tremor out more.    She notes shaking in her head and neck and sometimes in her body when she wakes up.     She notes upper body jerks when she is drowsy.   She also notes facial movements when she is tired.    She notes reduced gait.   She feels balance is more of a problem than strength.  She hits the walls at times as she walks and notes falling more to the right.    She ha no recent falls.   Steps are normal sized.   She feels her turning is more off balanced..    A couple years ago, she reports a fall in [place] hurting her left side.   She fell without any reason -- no slip or trip.       Data: MRI of the cervical spine from 08/23/2011 showed:Marland Kitchen  There are degenerative changes, worse at C6-C7 but no spinal cord changes or significant spinal stenosis  CT cervical spine 7/15/02021 showed "Cervical disc and facet degeneration with moderate spinal stenosis at C5-6 and mild spinal stenosis at C3-4 and C6-7." and multilevel foraminal narrowing (moderate to the right at C3C4, C4C5, moderate to left at C6C7.     CT head 01/23/2020 showed mild chronic microvascular ischemic changes.    REVIEW OF SYSTEMS: Constitutional: She reports fevers, chills, weight gain, fatigue, insomnia Eyes: She reports eye pain. Ear, nose and throat: She reports hearing loss with tinnitus on the left. Cardiovascular: No chest pain, palpitations Respiratory:She reports shortness of  breath, cough and snoring GastrointestinaI: No nausea, vomiting, diarrhea, abdominal pain, fecal incontinence Genitourinary:She has incontinence and has an InterStim device.  Musculoskeletal:She reports neck pain, back pain, myalgias and joint pain. Integumentary: She reports pruritus and mild.   Neurological: as above Psychiatric: She sees psychiatry for bipolar disorder. Endocrine: She reports feeling hot or cold at times and increased thirst. Hematologic/Lymphatic: No anemia, purpura, petechiae. Allergic/Immunologic: No itchy/runny eyes, nasal congestion, recent allergic reactions, rashes  ALLERGIES: Allergies  Allergen Reactions  .  Diclofenac Sodium Anaphylaxis and Swelling    "almost died" caused neck to swell.   . Diclofenac Sodium Anaphylaxis and Swelling    "almost died" caused neck to swell.   . Nsaids Anaphylaxis  . Penicillins Swelling and Rash    Did it involve swelling of the face/tongue/throat, SOB, or low BP? Y Did it involve sudden or severe rash/hives, skin peeling, or any reaction on the inside of your mouth or nose? Y Did you need to seek medical attention at a hospital or doctor's office? n When did it last happen?more than 10 years ago If all above answers are "NO", may proceed with cephalosporin use.   . Relafen [Nabumetone] Anaphylaxis and Swelling  . Other Other (See Comments) and Swelling    Honey causes throat to swell. Strawberries cause a rash.  "turnes red, swells, and pulls skin off" "turnes red, swells, and pulls skin off"  . Adhesive [Tape] Swelling    "turnes red, swells, and pulls skin off"  . Codeine Nausea And Vomiting    severe  . Ex-Lax [Senna] Other (See Comments)    Pt states "my fingers turned black"  . Trazodone And Nefazodone Other (See Comments)    Possible akathisia  . Neosporin [Neomycin-Bacitracin Zn-Polymyx] Rash    HOME MEDICATIONS:  Current Outpatient Medications:  .  albuterol (PROVENTIL HFA;VENTOLIN HFA) 108 (90 Base) MCG/ACT inhaler, Inhale 2 puffs into the lungs every 4 (four) hours as needed for wheezing or shortness of breath. , Disp: , Rfl:  .  aspirin 81 MG chewable tablet, Chew 81 mg by mouth daily., Disp: , Rfl:  .  azelastine (ASTELIN) 0.1 % nasal spray, Place 2 sprays into both nostrils 2 (two) times daily. , Disp: , Rfl:  .  carbidopa-levodopa (SINEMET IR) 25-100 MG tablet, One po tid, Disp: 90 tablet, Rfl: 3 .  ciprofloxacin (CIPRO) 500 MG tablet, Take 500 mg by mouth 2 (two) times daily., Disp: , Rfl:  .  clonazePAM (KLONOPIN) 0.5 MG tablet, Take 0.5 mg by mouth daily as needed for anxiety., Disp: , Rfl:  .  gabapentin (NEURONTIN) 300 MG  capsule, Take 1 capsule (300 mg total) by mouth 3 (three) times daily. (Patient taking differently: Take 300 mg by mouth 3 (three) times daily. 800 mg at bed time 4 400 mg 3 times a day), Disp: 90 capsule, Rfl: 2 .  HYDROcodone-acetaminophen (NORCO) 7.5-325 MG tablet, Take 1 tablet by mouth 3 (three) times daily as needed for moderate pain., Disp: 80 tablet, Rfl: 0 .  levothyroxine (SYNTHROID, LEVOTHROID) 100 MCG tablet, TAKE 1 TABLET(100 MCG) BY MOUTH DAILY BEFORE BREAKFAST (Patient taking differently: Take 100 mcg by mouth daily before breakfast. ), Disp: 30 tablet, Rfl: 5 .  losartan (COZAAR) 100 MG tablet, Take 100 mg by mouth daily. , Disp: , Rfl:  .  magnesium hydroxide (MILK OF MAGNESIA) 400 MG/5ML suspension, Take 30 mLs by mouth daily as needed for mild constipation., Disp: , Rfl:  .  metoprolol tartrate (LOPRESSOR) 25  MG tablet, Take 25 mg by mouth 2 (two) times daily. , Disp: , Rfl:  .  naloxone (NARCAN) nasal spray 4 mg/0.1 mL, Place into the nose. As directed, Disp: , Rfl:  .  omeprazole (PRILOSEC) 40 MG capsule, Take 1 capsule (40 mg total) by mouth 2 (two) times daily., Disp: 180 capsule, Rfl: 0 .  polyethylene glycol (MIRALAX / GLYCOLAX) packet, Take 17 g by mouth daily as needed for mild constipation., Disp: , Rfl:  .  pravastatin (PRAVACHOL) 20 MG tablet, TAKE 1 TABLET (20 MG TOTAL) BY MOUTH DAILY., Disp: 90 tablet, Rfl: 3 .  primidone (MYSOLINE) 50 MG tablet, Take 1 tablet (50 mg total) by mouth 3 (three) times daily., Disp: 90 tablet, Rfl: 3 .  QUEtiapine Fumarate (SEROQUEL XR) 150 MG 24 hr tablet, Take 150 mg by mouth at bedtime., Disp: , Rfl:  .  venlafaxine (EFFEXOR) 100 MG tablet, TAKE ONE TABLET BY MOUTH DAILY THREE TIMES A DAY, Disp: 90 tablet, Rfl: 0 .  lamoTRIgine (LAMICTAL) 150 MG tablet, Take 1 tablet by mouth 2 (two) times daily. , Disp: , Rfl:   PAST MEDICAL HISTORY: Past Medical History:  Diagnosis Date  . Anxiety   . Cervical radiculopathy   . Chronic back pain     R > L  . Chronic neck pain   . Depression   . Dysphagia    functional --  takes small bites  . Fibromyalgia   . GERD (gastroesophageal reflux disease)   . Hemorrhoids   . History of ankle fracture 01/08/2012   Status post open reduction internal fixation of a left ankle trimalleolar fracture by Dr. Iona Hansen on 01/10/2012.    Marland Kitchen History of benign thyroid tumor   . History of diabetes mellitus, type II    was diet controlled -- per pt pcp she was no longer diabetic  . History of hiatal hernia   . History of TIA (transient ischemic attack)    02-27-2009  no residual  . HTN (hypertension)   . Hyperlipidemia   . Hypothyroidism, postsurgical   . Lumbar radicular pain    R>L legs  . Migraine headache   . Mild intermittent asthma with allergic rhinitis without complication   . PONV (postoperative nausea and vomiting)   . Rotator cuff syndrome of right shoulder   . Urge urinary incontinence    refractory  . Wears glasses     PAST SURGICAL HISTORY: Past Surgical History:  Procedure Laterality Date  . CARDIAC CATHETERIZATION  01-17-2003  dr Darnell Level brodie   normal coronary arteries and LVF,  ef 60%  . EXCISION MORTON'S NEUROMA  2002   bilateral feet  . EXCISION OF BREAST BIOPSY Right 1990's   benign  . INTERSTIM IMPLANT PLACEMENT N/A 02/10/2015   Procedure: Barrie Lyme IMPLANT FIRST STAGE;  Surgeon: Bjorn Loser, MD;  Location: [facility];  Service: Urology;  Laterality: N/A;  . INTERSTIM IMPLANT PLACEMENT N/A 02/10/2015   Procedure: Barrie Lyme IMPLANT SECOND STAGE;  Surgeon: Bjorn Loser, MD;  Location: [facility];  Service: Urology;  Laterality: N/A;  . LAPAROSCOPIC CHOLECYSTECTOMY  2000  . NEGATIVE SLEEP STUDY  2000   per pt  . ORIF ANKLE FRACTURE  01/10/2012   Procedure: OPEN REDUCTION INTERNAL FIXATION (ORIF) ANKLE FRACTURE;  Surgeon: Sanjuana Kava, MD;  Location: [facility];  Service: Orthopedics;  Laterality: Left;  . TOTAL THYROIDECTOMY   1985   benign tumor  . TRANSTHORACIC ECHOCARDIOGRAM  03-02-2009   normal  echo,  ef 55-60%  . VAGINAL HYSTERECTOMY  1984  . WRIST SURGERY Left 2000    FAMILY HISTORY: Family History  Problem Relation Age of Onset  . Cancer Other        breast -- grandmother  . Cancer Other        lung -- uncles (3)  . Leukemia Sister   . Diabetes Father   . Coronary artery disease Father   . Diabetes Mother   . Dementia Mother     SOCIAL HISTORY:  Social History   Socioeconomic History  . Marital status: Married    Spouse name: Rosan Calbert  . Number of children: 2  . Years of education: 49  . Highest education level: Not on file  Occupational History  . Occupation: Retired  Tobacco Use  . Smoking status: Never Smoker  . Smokeless tobacco: Never Used  Substance and Sexual Activity  . Alcohol use: No  . Drug use: No  . Sexual activity: Not on file  Other Topics Concern  . Not on file  Social History Narrative   Lives with Veeda Virgo (husband) and Francene Finders (grandson)   Caffeine use: 7 cups per day   Social Determinants of Health   Financial Resource Strain:   . Difficulty of Paying Living Expenses: Not on file  Food Insecurity:   . Worried About Charity fundraiser in the Last Year: Not on file  . Ran Out of Food in the Last Year: Not on file  Transportation Needs:   . Lack of Transportation (Medical): Not on file  . Lack of Transportation (Non-Medical): Not on file  Physical Activity:   . Days of Exercise per Week: Not on file  . Minutes of Exercise per Session: Not on file  Stress:   . Feeling of Stress : Not on file  Social Connections:   . Frequency of Communication with Friends and Family: Not on file  . Frequency of Social Gatherings with Friends and Family: Not on file  . Attends Religious Services: Not on file  . Active Member of Clubs or Organizations: Not on file  . Attends Archivist Meetings: Not on file  . Marital Status: Not on file   Intimate Partner Violence:   . Fear of Current or Ex-Partner: Not on file  . Emotionally Abused: Not on file  . Physically Abused: Not on file  . Sexually Abused: Not on file     PHYSICAL EXAM  Vitals:   03/19/20 0852  BP: (!) 163/88  Pulse: 75  Weight: 210 lb (95.3 kg)  Height: 5\' 5"  (1.651 m)    Body mass index is 34.95 kg/m.   General: The patient is well-developed and well-nourished and in no acute distress  HEENT:  Head is [place]/AT.  Sclera are anicteric.     Skin: Extremities are without rash or edema.   Neurologic Exam  Mental status: The patient is alert and oriented x 3 at the time of the examination. The patient has apparent normal recent and remote memory, with an apparently normal attention span and concentration ability.   Speech is normal.  Cranial nerves: Extraocular movements are full   Facial symmetry is present.  Facial strength is normal.  No dysarthria is noted.   . No obvious hearing deficits are noted.  Motor:  She has a 5-6 hz tremor in hands, mildly worse with intention.  Some distractability of the tremor. Some angulations with writing and spirals.   Muscle  bulk is normal.   Tone is normal. Strength is  5 / 5 in all 4 extremities.   Sensory: Sensory testing is intact to pinprick, soft touch and vibration sensation in the arms.  She reports mildly reduced vibration sensation in the feet.  Coordination: Cerebellar testing reveals good finger-nose-finger and heel-to-shin bilaterally.  Gait and station: Station is normal.   Gait is just slightly wide. Tandem gait is wide .She can turn in 3 steps Romberg is negative.  She has retropulsion with posture disturbed.    Reflexes: Deep tendon reflexes are 3 at knees but no crossed adductors today and 2 at ankles, no clonus .   Plantar responses are flexor.    DIAGNOSTIC DATA (LABS, IMAGING, TESTING) - I reviewed patient records, labs, notes, testing and imaging myself where available.  Lab Results   Component Value Date   WBC 5.7 07/25/2018   HGB 12.7 07/25/2018   HCT 41.0 07/25/2018   MCV 96.5 07/25/2018   PLT 313 07/25/2018      Component Value Date/Time   NA 137 07/25/2018 1417   NA 141 06/15/2015 0954   K 4.3 07/25/2018 1417   CL 102 07/25/2018 1417   CO2 26 07/25/2018 1417   GLUCOSE 106 (H) 07/25/2018 1417   BUN 15 07/25/2018 1417   BUN 15 06/15/2015 0954   CREATININE 0.74 07/25/2018 1417   CREATININE 0.67 09/18/2014 1145   CALCIUM 9.1 07/25/2018 1417   PROT 7.7 07/25/2018 1417   ALBUMIN 4.9 07/25/2018 1417   AST 20 07/25/2018 1417   ALT 21 07/25/2018 1417   ALKPHOS 98 07/25/2018 1417   BILITOT 0.7 07/25/2018 1417   GFRNONAA >60 07/25/2018 1417   GFRNONAA >89 09/18/2014 1145   GFRAA >60 07/25/2018 1417   GFRAA >89 09/18/2014 1145   Lab Results  Component Value Date   CHOL 170 09/18/2014   HDL 63 09/18/2014   LDLCALC 81 09/18/2014   TRIG 128 09/18/2014   CHOLHDL 2.7 09/18/2014   Lab Results  Component Value Date   HGBA1C 5.8 02/27/2015   Lab Results  Component Value Date   VITAMINB12 506 12/30/2009   Lab Results  Component Value Date   TSH 2.680 06/15/2015       ASSESSMENT AND PLAN  Tremor  Gait disturbance  Risk for falls  Urinary urgency  Cervical spinal stenosis  1.   Tremor is unusual.  Might have PD/BET overlap.  Also some distractability.  She is already on metoprolol.  She had a benefit from Sinemet and will continue. 2.   Has moderate spinal stenosis.   3 knee DTRs but no crossed adductors , babinski or other definite long tract sign.   3.   I discussed that I do not specialize in movement disorders and am not sure if anotehr medication may help her better.    Due to mixed nature of symptoms, we will set up referral to see a movement disorder specialist.   4.   rtc 6 months   Alanta Scobey A. Felecia Shelling, MD, [facility] 11/15/8467, 6:29 AM Certified in Neurology, Clinical Neurophysiology, Sleep Medicine and Neuroimaging  [facility] [address], [place], [place] [address] [phone]

## 2020-03-20 DIAGNOSIS — F411 Generalized anxiety disorder: Secondary | ICD-10-CM | POA: Diagnosis not present

## 2020-03-20 DIAGNOSIS — R69 Illness, unspecified: Secondary | ICD-10-CM | POA: Diagnosis not present

## 2020-03-23 DIAGNOSIS — M25561 Pain in right knee: Secondary | ICD-10-CM | POA: Diagnosis not present

## 2020-03-23 DIAGNOSIS — M1712 Unilateral primary osteoarthritis, left knee: Secondary | ICD-10-CM | POA: Diagnosis not present

## 2020-03-23 DIAGNOSIS — M1711 Unilateral primary osteoarthritis, right knee: Secondary | ICD-10-CM | POA: Diagnosis not present

## 2020-03-23 DIAGNOSIS — M25562 Pain in left knee: Secondary | ICD-10-CM | POA: Diagnosis not present

## 2020-03-30 DIAGNOSIS — E119 Type 2 diabetes mellitus without complications: Secondary | ICD-10-CM | POA: Diagnosis not present

## 2020-03-30 DIAGNOSIS — H524 Presbyopia: Secondary | ICD-10-CM | POA: Diagnosis not present

## 2020-03-30 DIAGNOSIS — H2513 Age-related nuclear cataract, bilateral: Secondary | ICD-10-CM | POA: Diagnosis not present

## 2020-04-03 DIAGNOSIS — Z79899 Other long term (current) drug therapy: Secondary | ICD-10-CM | POA: Diagnosis not present

## 2020-04-03 DIAGNOSIS — M7711 Lateral epicondylitis, right elbow: Secondary | ICD-10-CM | POA: Diagnosis not present

## 2020-04-03 DIAGNOSIS — M47812 Spondylosis without myelopathy or radiculopathy, cervical region: Secondary | ICD-10-CM | POA: Diagnosis not present

## 2020-04-03 DIAGNOSIS — M129 Arthropathy, unspecified: Secondary | ICD-10-CM | POA: Diagnosis not present

## 2020-04-03 DIAGNOSIS — G894 Chronic pain syndrome: Secondary | ICD-10-CM | POA: Diagnosis not present

## 2020-04-03 DIAGNOSIS — E559 Vitamin D deficiency, unspecified: Secondary | ICD-10-CM | POA: Diagnosis not present

## 2020-04-03 DIAGNOSIS — M7712 Lateral epicondylitis, left elbow: Secondary | ICD-10-CM | POA: Diagnosis not present

## 2020-04-22 DIAGNOSIS — I10 Essential (primary) hypertension: Secondary | ICD-10-CM | POA: Diagnosis not present

## 2020-04-22 DIAGNOSIS — N9089 Other specified noninflammatory disorders of vulva and perineum: Secondary | ICD-10-CM | POA: Diagnosis not present

## 2020-04-22 DIAGNOSIS — N952 Postmenopausal atrophic vaginitis: Secondary | ICD-10-CM | POA: Diagnosis not present

## 2020-04-23 ENCOUNTER — Other Ambulatory Visit: Payer: Self-pay | Admitting: Neurology

## 2020-04-23 DIAGNOSIS — N3941 Urge incontinence: Secondary | ICD-10-CM | POA: Diagnosis not present

## 2020-04-23 DIAGNOSIS — N3011 Interstitial cystitis (chronic) with hematuria: Secondary | ICD-10-CM | POA: Diagnosis not present

## 2020-04-29 DIAGNOSIS — Z79899 Other long term (current) drug therapy: Secondary | ICD-10-CM | POA: Diagnosis not present

## 2020-04-29 DIAGNOSIS — K625 Hemorrhage of anus and rectum: Secondary | ICD-10-CM | POA: Diagnosis not present

## 2020-04-29 DIAGNOSIS — K219 Gastro-esophageal reflux disease without esophagitis: Secondary | ICD-10-CM | POA: Diagnosis not present

## 2020-04-29 DIAGNOSIS — R1310 Dysphagia, unspecified: Secondary | ICD-10-CM | POA: Diagnosis not present

## 2020-04-29 DIAGNOSIS — K5909 Other constipation: Secondary | ICD-10-CM | POA: Diagnosis not present

## 2020-05-06 ENCOUNTER — Ambulatory Visit: Payer: Medicare HMO | Admitting: Neurology

## 2020-05-06 DIAGNOSIS — R69 Illness, unspecified: Secondary | ICD-10-CM | POA: Diagnosis not present

## 2020-05-06 DIAGNOSIS — I10 Essential (primary) hypertension: Secondary | ICD-10-CM | POA: Diagnosis not present

## 2020-05-06 DIAGNOSIS — F5105 Insomnia due to other mental disorder: Secondary | ICD-10-CM | POA: Diagnosis not present

## 2020-05-06 DIAGNOSIS — F411 Generalized anxiety disorder: Secondary | ICD-10-CM | POA: Diagnosis not present

## 2020-05-06 DIAGNOSIS — F4310 Post-traumatic stress disorder, unspecified: Secondary | ICD-10-CM | POA: Diagnosis not present

## 2020-05-07 DIAGNOSIS — R69 Illness, unspecified: Secondary | ICD-10-CM | POA: Diagnosis not present

## 2020-05-20 DIAGNOSIS — K219 Gastro-esophageal reflux disease without esophagitis: Secondary | ICD-10-CM | POA: Diagnosis not present

## 2020-05-20 DIAGNOSIS — R1319 Other dysphagia: Secondary | ICD-10-CM | POA: Diagnosis not present

## 2020-05-20 DIAGNOSIS — Z01818 Encounter for other preprocedural examination: Secondary | ICD-10-CM | POA: Diagnosis not present

## 2020-05-20 DIAGNOSIS — Z1211 Encounter for screening for malignant neoplasm of colon: Secondary | ICD-10-CM | POA: Diagnosis not present

## 2020-05-21 DIAGNOSIS — M85852 Other specified disorders of bone density and structure, left thigh: Secondary | ICD-10-CM | POA: Diagnosis not present

## 2020-05-21 DIAGNOSIS — Z780 Asymptomatic menopausal state: Secondary | ICD-10-CM | POA: Diagnosis not present

## 2020-05-21 DIAGNOSIS — Z13820 Encounter for screening for osteoporosis: Secondary | ICD-10-CM | POA: Diagnosis not present

## 2020-05-21 DIAGNOSIS — Z1231 Encounter for screening mammogram for malignant neoplasm of breast: Secondary | ICD-10-CM | POA: Diagnosis not present

## 2020-05-25 DIAGNOSIS — Z01818 Encounter for other preprocedural examination: Secondary | ICD-10-CM | POA: Diagnosis not present

## 2020-05-25 DIAGNOSIS — K219 Gastro-esophageal reflux disease without esophagitis: Secondary | ICD-10-CM | POA: Diagnosis not present

## 2020-05-25 DIAGNOSIS — R1319 Other dysphagia: Secondary | ICD-10-CM | POA: Diagnosis not present

## 2020-05-25 DIAGNOSIS — Z1211 Encounter for screening for malignant neoplasm of colon: Secondary | ICD-10-CM | POA: Diagnosis not present

## 2020-05-28 DIAGNOSIS — Z01818 Encounter for other preprocedural examination: Secondary | ICD-10-CM | POA: Diagnosis not present

## 2020-05-28 DIAGNOSIS — Z1211 Encounter for screening for malignant neoplasm of colon: Secondary | ICD-10-CM | POA: Diagnosis not present

## 2020-06-01 DIAGNOSIS — K2970 Gastritis, unspecified, without bleeding: Secondary | ICD-10-CM | POA: Diagnosis not present

## 2020-06-01 DIAGNOSIS — K2270 Barrett's esophagus without dysplasia: Secondary | ICD-10-CM | POA: Diagnosis not present

## 2020-06-01 DIAGNOSIS — K900 Celiac disease: Secondary | ICD-10-CM | POA: Diagnosis not present

## 2020-06-01 DIAGNOSIS — A048 Other specified bacterial intestinal infections: Secondary | ICD-10-CM | POA: Diagnosis not present

## 2020-06-02 DIAGNOSIS — Z79899 Other long term (current) drug therapy: Secondary | ICD-10-CM | POA: Diagnosis not present

## 2020-06-02 DIAGNOSIS — K5909 Other constipation: Secondary | ICD-10-CM | POA: Diagnosis not present

## 2020-06-02 DIAGNOSIS — M8580 Other specified disorders of bone density and structure, unspecified site: Secondary | ICD-10-CM | POA: Diagnosis not present

## 2020-06-02 DIAGNOSIS — R21 Rash and other nonspecific skin eruption: Secondary | ICD-10-CM | POA: Diagnosis not present

## 2020-06-02 DIAGNOSIS — G20 Parkinson's disease: Secondary | ICD-10-CM | POA: Diagnosis not present

## 2020-06-05 DIAGNOSIS — K200 Eosinophilic esophagitis: Secondary | ICD-10-CM | POA: Diagnosis not present

## 2020-06-10 DIAGNOSIS — N952 Postmenopausal atrophic vaginitis: Secondary | ICD-10-CM | POA: Diagnosis not present

## 2020-06-10 DIAGNOSIS — N9089 Other specified noninflammatory disorders of vulva and perineum: Secondary | ICD-10-CM | POA: Diagnosis not present

## 2020-06-10 DIAGNOSIS — I10 Essential (primary) hypertension: Secondary | ICD-10-CM | POA: Diagnosis not present

## 2020-07-15 DIAGNOSIS — K2270 Barrett's esophagus without dysplasia: Secondary | ICD-10-CM | POA: Diagnosis not present

## 2020-07-15 DIAGNOSIS — K219 Gastro-esophageal reflux disease without esophagitis: Secondary | ICD-10-CM | POA: Diagnosis not present

## 2020-07-15 DIAGNOSIS — Z8371 Family history of colonic polyps: Secondary | ICD-10-CM | POA: Diagnosis not present

## 2020-07-15 DIAGNOSIS — K5909 Other constipation: Secondary | ICD-10-CM | POA: Diagnosis not present

## 2020-07-16 DIAGNOSIS — F3131 Bipolar disorder, current episode depressed, mild: Secondary | ICD-10-CM | POA: Diagnosis not present

## 2020-07-16 DIAGNOSIS — F5105 Insomnia due to other mental disorder: Secondary | ICD-10-CM | POA: Diagnosis not present

## 2020-07-16 DIAGNOSIS — F4310 Post-traumatic stress disorder, unspecified: Secondary | ICD-10-CM | POA: Diagnosis not present

## 2020-07-16 DIAGNOSIS — F411 Generalized anxiety disorder: Secondary | ICD-10-CM | POA: Diagnosis not present

## 2020-07-24 DIAGNOSIS — Z6833 Body mass index (BMI) 33.0-33.9, adult: Secondary | ICD-10-CM | POA: Diagnosis not present

## 2020-07-24 DIAGNOSIS — Z23 Encounter for immunization: Secondary | ICD-10-CM | POA: Diagnosis not present

## 2020-07-24 DIAGNOSIS — Z79899 Other long term (current) drug therapy: Secondary | ICD-10-CM | POA: Diagnosis not present

## 2020-07-24 DIAGNOSIS — G20 Parkinson's disease: Secondary | ICD-10-CM | POA: Diagnosis not present

## 2020-07-24 DIAGNOSIS — G894 Chronic pain syndrome: Secondary | ICD-10-CM | POA: Diagnosis not present

## 2020-07-24 DIAGNOSIS — M7711 Lateral epicondylitis, right elbow: Secondary | ICD-10-CM | POA: Diagnosis not present

## 2020-07-28 DIAGNOSIS — F3131 Bipolar disorder, current episode depressed, mild: Secondary | ICD-10-CM | POA: Diagnosis not present

## 2020-08-21 DIAGNOSIS — Z6833 Body mass index (BMI) 33.0-33.9, adult: Secondary | ICD-10-CM | POA: Diagnosis not present

## 2020-08-21 DIAGNOSIS — M7712 Lateral epicondylitis, left elbow: Secondary | ICD-10-CM | POA: Diagnosis not present

## 2020-08-21 DIAGNOSIS — G20 Parkinson's disease: Secondary | ICD-10-CM | POA: Diagnosis not present

## 2020-08-21 DIAGNOSIS — G894 Chronic pain syndrome: Secondary | ICD-10-CM | POA: Diagnosis not present

## 2020-08-21 DIAGNOSIS — E559 Vitamin D deficiency, unspecified: Secondary | ICD-10-CM | POA: Diagnosis not present

## 2020-08-21 DIAGNOSIS — Z79899 Other long term (current) drug therapy: Secondary | ICD-10-CM | POA: Diagnosis not present

## 2020-08-21 DIAGNOSIS — M7711 Lateral epicondylitis, right elbow: Secondary | ICD-10-CM | POA: Diagnosis not present

## 2020-08-24 ENCOUNTER — Emergency Department (HOSPITAL_COMMUNITY)
Admission: EM | Admit: 2020-08-24 | Discharge: 2020-08-24 | Disposition: A | Discharge status: Home / Self Care | Payer: Medicare HMO | Attending: Emergency Medicine | Admitting: Emergency Medicine

## 2020-08-24 ENCOUNTER — Other Ambulatory Visit: Payer: Self-pay

## 2020-08-24 ENCOUNTER — Emergency Department (HOSPITAL_COMMUNITY): Payer: Medicare HMO

## 2020-08-24 ENCOUNTER — Encounter (HOSPITAL_COMMUNITY): Payer: Self-pay

## 2020-08-24 DIAGNOSIS — E039 Hypothyroidism, unspecified: Secondary | ICD-10-CM | POA: Insufficient documentation

## 2020-08-24 DIAGNOSIS — Z85850 Personal history of malignant neoplasm of thyroid: Secondary | ICD-10-CM | POA: Diagnosis not present

## 2020-08-24 DIAGNOSIS — Z79899 Other long term (current) drug therapy: Secondary | ICD-10-CM | POA: Insufficient documentation

## 2020-08-24 DIAGNOSIS — I700 Atherosclerosis of aorta: Secondary | ICD-10-CM | POA: Diagnosis not present

## 2020-08-24 DIAGNOSIS — R600 Localized edema: Secondary | ICD-10-CM | POA: Insufficient documentation

## 2020-08-24 DIAGNOSIS — J4520 Mild intermittent asthma, uncomplicated: Secondary | ICD-10-CM | POA: Diagnosis not present

## 2020-08-24 DIAGNOSIS — Z7982 Long term (current) use of aspirin: Secondary | ICD-10-CM | POA: Diagnosis not present

## 2020-08-24 DIAGNOSIS — R0602 Shortness of breath: Secondary | ICD-10-CM | POA: Diagnosis not present

## 2020-08-24 DIAGNOSIS — R059 Cough, unspecified: Secondary | ICD-10-CM | POA: Insufficient documentation

## 2020-08-24 DIAGNOSIS — I10 Essential (primary) hypertension: Secondary | ICD-10-CM | POA: Diagnosis not present

## 2020-08-24 DIAGNOSIS — E119 Type 2 diabetes mellitus without complications: Secondary | ICD-10-CM | POA: Insufficient documentation

## 2020-08-24 DIAGNOSIS — N3090 Cystitis, unspecified without hematuria: Secondary | ICD-10-CM | POA: Diagnosis not present

## 2020-08-24 DIAGNOSIS — N3000 Acute cystitis without hematuria: Secondary | ICD-10-CM | POA: Diagnosis not present

## 2020-08-24 LAB — URINALYSIS, ROUTINE W REFLEX MICROSCOPIC
Bilirubin Urine: NEGATIVE
Glucose, UA: NEGATIVE mg/dL
Ketones, ur: NEGATIVE mg/dL
Nitrite: POSITIVE — AB
Protein, ur: NEGATIVE mg/dL
Specific Gravity, Urine: 1.005 (ref 1.005–1.030)
WBC, UA: >50 WBC/hpf — ABNORMAL HIGH (ref 0–5)
pH: 6 (ref 5.0–8.0)

## 2020-08-24 LAB — BASIC METABOLIC PANEL
Anion gap: 12 (ref 5–15)
BUN: 9 mg/dL (ref 8–23)
CO2: 25 mmol/L (ref 22–32)
Calcium: 9.2 mg/dL (ref 8.9–10.3)
Chloride: 104 mmol/L (ref 98–111)
Creatinine, Ser: 0.75 mg/dL (ref 0.44–1.00)
GFR, Estimated: >60 mL/min (ref >60)
Glucose, Bld: 95 mg/dL (ref 70–99)
Potassium: 4.1 mmol/L (ref 3.5–5.1)
Sodium: 141 mmol/L (ref 135–145)

## 2020-08-24 LAB — CBC
HCT: 37.2 % (ref 36.0–46.0)
Hemoglobin: 11.7 g/dL — ABNORMAL LOW (ref 12.0–15.0)
MCH: 29.9 pg (ref 26.0–34.0)
MCHC: 31.5 g/dL (ref 30.0–36.0)
MCV: 95.1 fL (ref 80.0–100.0)
Platelets: 250 10*3/uL (ref 150–400)
RBC: 3.91 MIL/uL (ref 3.87–5.11)
RDW: 13.6 % (ref 11.5–15.5)
WBC: 5.3 10*3/uL (ref 4.0–10.5)
nRBC: 0 % (ref 0.0–0.2)

## 2020-08-24 LAB — TROPONIN I (HIGH SENSITIVITY): Troponin I (High Sensitivity): 3 ng/L (ref <18)

## 2020-08-24 LAB — BRAIN NATRIURETIC PEPTIDE: B Natriuretic Peptide: 54.9 pg/mL (ref 0.0–100.0)

## 2020-08-24 LAB — D-DIMER, QUANTITATIVE: D-Dimer, Quant: 0.52 ug/mL-FEU — ABNORMAL HIGH (ref 0.00–0.50)

## 2020-08-24 MED ORDER — FOSFOMYCIN TROMETHAMINE 3 G PO PACK
3.0000 g | PACK | Freq: Once | ORAL | Status: AC
  Administered 2020-08-24: 3 g via ORAL
  Filled 2020-08-24: qty 3

## 2020-08-24 NOTE — ED Triage Notes (Signed)
Pt endorses SHOB x2 months and stated that she noticed lower extremity swelling over the last few days. Pt called PCP and was told to come here to be evaluated. Pt denies chest pain, upper back pain, and N/V/D.

## 2020-08-24 NOTE — ED Provider Notes (Signed)
[facility] DEPT Provider Note   CSN: [id number] Arrival date & time: 08/24/20  1649     History Chief Complaint  Patient presents with  . Shortness of Breath  . Leg Swelling    [name] [name] is a 75 y.o. female.  The history is provided by the patient and medical records. No language interpreter was used.  Shortness of Breath  [name] [name] is a 75 y.o. female who presents to the Emergency Department complaining of sob.  She presents to the ED complaining of two months of sob and one week of RLE edema.  Sob is gradually worsening.  Has a cough in the morning, productive of white sputum.  Does have significant reflux, worsening over the last few days.  No nausea/vomiting, abdominal pain.    Denies associated chest pain, fever.    No hx/o DVT/PE.  Does not take estrogen.   No recent surgeries.  No known sick contacts.   Has been vaccinated for COVID 19.     Past Medical History:  Diagnosis Date  . Anxiety   . Cervical radiculopathy   . Chronic back pain    R > L  . Chronic neck pain   . Depression   . Dysphagia    functional --  takes small bites  . Fibromyalgia   . GERD (gastroesophageal reflux disease)   . Hemorrhoids   . History of ankle fracture 01/08/2012   Status post open reduction internal fixation of a left ankle trimalleolar fracture by Dr. Iona Hansen on 01/10/2012.    Marland Kitchen History of benign thyroid tumor   . History of diabetes mellitus, type II    was diet controlled -- per pt pcp she was no longer diabetic  . History of hiatal hernia   . History of TIA (transient ischemic attack)    02-27-2009  no residual  . HTN (hypertension)   . Hyperlipidemia   . Hypothyroidism, postsurgical   . Lumbar radicular pain    R>L legs  . Migraine headache   . Mild intermittent asthma with allergic rhinitis without complication   . PONV (postoperative nausea and vomiting)   . Rotator cuff syndrome of right shoulder   . Urge urinary  incontinence    refractory  . Wears glasses     Patient Active Problem List   Diagnosis Date Noted  . Cervical spinal stenosis 03/19/2020  . Hyperreflexia 12/26/2019  . Urinary urgency 12/26/2019  . Tremor 10/22/2019  . Palpitations 06/15/2015  . Risk for falls 03/23/2015  . Depression with anxiety 08/26/2014  . Lumbosacral spondylosis without myelopathy 08/20/2013  . Lumbar facet arthropathy 08/20/2013  . Tension headache 08/19/2013  . Healthcare maintenance 08/19/2013  . Greater trochanteric bursitis of left hip 02/12/2013  . Osteoarthritis of right hand 11/12/2012  . Biceps tendinitis on right 11/12/2012  . Gait disturbance 10/16/2012  . Cervical radiculopathy at C7 left 10/14/2011  . Rotator cuff syndrome 10/14/2011  . HYPERCHOLESTEROLEMIA 11/14/2007  . Migraine without aura 10/25/2006  . Thoracic back pain 10/25/2006  . Fibromyalgia 10/25/2006  . Hypothyroidism 04/11/2006  . Essential hypertension 04/11/2006  . Gastroesophageal reflux disease 04/11/2006    Past Surgical History:  Procedure Laterality Date  . CARDIAC CATHETERIZATION  01-17-2003  dr Darnell Level brodie   normal coronary arteries and LVF,  ef 60%  . EXCISION MORTON'S NEUROMA  2002   bilateral feet  . EXCISION OF BREAST BIOPSY Right 1990's   benign  . INTERSTIM IMPLANT PLACEMENT  N/A 02/10/2015   Procedure: Barrie Lyme IMPLANT FIRST STAGE;  Surgeon: Bjorn Loser, MD;  Location: [facility];  Service: Urology;  Laterality: N/A;  . INTERSTIM IMPLANT PLACEMENT N/A 02/10/2015   Procedure: Barrie Lyme IMPLANT SECOND STAGE;  Surgeon: Bjorn Loser, MD;  Location: [facility];  Service: Urology;  Laterality: N/A;  . LAPAROSCOPIC CHOLECYSTECTOMY  2000  . NEGATIVE SLEEP STUDY  2000   per pt  . ORIF ANKLE FRACTURE  01/10/2012   Procedure: OPEN REDUCTION INTERNAL FIXATION (ORIF) ANKLE FRACTURE;  Surgeon: Sanjuana Kava, MD;  Location: [facility];  Service: Orthopedics;  Laterality: Left;  . TOTAL  THYROIDECTOMY  1985   benign tumor  . TRANSTHORACIC ECHOCARDIOGRAM  03-02-2009   normal echo,  ef 55-60%  . VAGINAL HYSTERECTOMY  1984  . WRIST SURGERY Left 2000     OB History   No obstetric history on file.     Family History  Problem Relation Age of Onset  . Cancer Other        breast -- grandmother  . Cancer Other        lung -- uncles (3)  . Leukemia Sister   . Diabetes Father   . Coronary artery disease Father   . Diabetes Mother   . Dementia Mother     Social History   Tobacco Use  . Smoking status: Never Smoker  . Smokeless tobacco: Never Used  Substance Use Topics  . Alcohol use: No  . Drug use: No    Home Medications Prior to Admission medications   Medication Sig Start Date End Date Taking? Authorizing Provider  albuterol (PROVENTIL HFA;VENTOLIN HFA) 108 (90 Base) MCG/ACT inhaler Inhale 2 puffs into the lungs every 4 (four) hours as needed for wheezing or shortness of breath.  03/24/17   [provider]  aspirin 81 MG chewable tablet Chew 81 mg by mouth daily.    [provider]  azelastine (ASTELIN) 0.1 % nasal spray Place 2 sprays into both nostrils 2 (two) times daily.  05/08/18   [provider]  carbidopa-levodopa (SINEMET IR) 25-100 MG tablet TAKE ONE TABLET BY MOUTH THREE TIMES A DAY 04/23/20   Sater, Nanine Means, MD  ciprofloxacin (CIPRO) 500 MG tablet Take 500 mg by mouth 2 (two) times daily.    [provider]  clonazePAM (KLONOPIN) 0.5 MG tablet Take 0.5 mg by mouth daily as needed for anxiety.    Atha Starks, MD  gabapentin (NEURONTIN) 300 MG capsule Take 1 capsule (300 mg total) by mouth 3 (three) times daily. Patient taking differently: Take 300 mg by mouth 3 (three) times daily. 800 mg at bed time 4 400 mg 3 times a day 07/01/19   Bayard Hugger, NP  HYDROcodone-acetaminophen (NORCO) 7.5-325 MG tablet Take 1 tablet by mouth 3 (three) times daily as needed for moderate pain. 07/22/19   Bayard Hugger, NP   lamoTRIgine (LAMICTAL) 150 MG tablet Take 1 tablet by mouth 2 (two) times daily.  05/17/19 08/15/19  [provider]  levothyroxine (SYNTHROID, LEVOTHROID) 100 MCG tablet TAKE 1 TABLET(100 MCG) BY MOUTH DAILY BEFORE BREAKFAST Patient taking differently: Take 100 mcg by mouth daily before breakfast.  01/13/16   Axel Filler, MD  losartan (COZAAR) 100 MG tablet Take 100 mg by mouth daily.  03/24/17   [provider]  magnesium hydroxide (MILK OF MAGNESIA) 400 MG/5ML suspension Take 30 mLs by mouth daily as needed for mild constipation.    [provider]  metoprolol tartrate (LOPRESSOR) 25 MG tablet Take 25 mg by mouth 2 (two) times daily.  03/24/17   [provider]  naloxone [facility]) nasal spray 4 mg/0.1 mL Place into the nose. As directed 05/17/19   [provider]  omeprazole (PRILOSEC) 40 MG capsule Take 1 capsule (40 mg total) by mouth 2 (two) times daily. 11/23/15   Oval Linsey, MD  polyethylene glycol [facility] Floria Raveling) packet Take 17 g by mouth daily as needed for mild constipation.    [provider]  pravastatin (PRAVACHOL) 20 MG tablet TAKE 1 TABLET (20 MG TOTAL) BY MOUTH DAILY. 03/23/15   Axel Filler, MD  primidone (MYSOLINE) 50 MG tablet Take 1 tablet (50 mg total) by mouth 3 (three) times daily. 10/22/19   Sater, Nanine Means, MD  QUEtiapine Fumarate (SEROQUEL XR) 150 MG 24 hr tablet Take 150 mg by mouth at bedtime. 06/15/19   [provider]  venlafaxine (EFFEXOR) 100 MG tablet TAKE ONE TABLET BY MOUTH DAILY THREE TIMES A DAY 10/22/18   Bayard Hugger, NP    Allergies    Diclofenac sodium, Diclofenac sodium, Nsaids, Penicillins, Relafen [nabumetone], Other, Adhesive [tape], Codeine, Ex-lax [senna], Trazodone and nefazodone, and Neosporin [neomycin-bacitracin zn-polymyx]  Review of Systems   Review of Systems  Respiratory: Positive for shortness of breath.   All other systems reviewed and are  negative.   Physical Exam Updated Vital Signs BP 104/66 (BP Location: Right Arm)   Pulse 62   Temp 98.1 F (36.7 C) (Oral)   Resp 13   SpO2 98%   Physical Exam Vitals and nursing note reviewed.  Constitutional:      Appearance: She is well-developed and well-nourished.  HENT:     Head: Normocephalic and atraumatic.  Cardiovascular:     Rate and Rhythm: Normal rate and regular rhythm.     Heart sounds: No murmur heard.   Pulmonary:     Effort: Pulmonary effort is normal. No respiratory distress.     Breath sounds: Normal breath sounds.  Abdominal:     Palpations: Abdomen is soft.     Tenderness: There is no abdominal tenderness. There is no guarding or rebound.  Musculoskeletal:        General: No tenderness or edema.     Comments: 1+ pitting edema to BLE.  2+ DP pulses bilaterally.    Skin:    General: Skin is warm and dry.  Neurological:     Mental Status: She is alert and oriented to person, place, and time.  Psychiatric:        Mood and Affect: Mood and affect normal.        Behavior: Behavior normal.     ED Results / Procedures / Treatments   Labs (all labs ordered are listed, but only abnormal results are displayed) Labs Reviewed  CBC - Abnormal; Notable for the following components:      Result Value   Hemoglobin 11.7 (*)    All other components within normal limits  D-DIMER, QUANTITATIVE (NOT AT [facility]) - Abnormal; Notable for the following components:   D-Dimer, Quant 0.52 (*)    All other components within normal limits  URINALYSIS, ROUTINE W REFLEX MICROSCOPIC - Abnormal; Notable for the following components:   APPearance HAZY (*)    Hgb urine dipstick SMALL (*)    Nitrite POSITIVE (*)    Leukocytes,Ua LARGE (*)    WBC, UA >50 (*)    Bacteria, UA RARE (*)  All other components within normal limits  URINE CULTURE  BASIC METABOLIC PANEL  BRAIN NATRIURETIC PEPTIDE  TROPONIN I (HIGH SENSITIVITY)    EKG EKG Interpretation  Date/Time:  Monday  August 24 2020 16:56:57 EST Ventricular Rate:  66 PR Interval:    QRS Duration: 113 QT Interval:  417 QTC Calculation: 437 R Axis:   41 Text Interpretation: Sinus rhythm Low voltage, extremity and precordial leads Consider anterolateral infarct Minimal ST depression, anterolateral leads Baseline wander in lead(s) III V1 V2 Confirmed by Quintella Reichert [phone]) on 08/24/2020 6:33:45 PM   Radiology DG Chest 2 View  Result Date: 08/24/2020 CLINICAL DATA:  Shortness breath EXAM: CHEST - 2 VIEW COMPARISON:  11/23/2011 FINDINGS: The heart size and mediastinal contours are within normal limits. Atherosclerotic calcification of the aortic knob. No focal airspace consolidation, pleural effusion, or pneumothorax. The visualized skeletal structures are unremarkable. IMPRESSION: No active cardiopulmonary disease. Electronically Signed   By: Davina Poke D.O.   On: 08/24/2020 17:21    Procedures Procedures   Medications Ordered in ED Medications  fosfomycin (MONUROL) packet 3 g (has no administration in time range)    ED Course  I have reviewed the triage vital signs and the nursing notes.  Pertinent labs & imaging results that were available during my care of the patient were reviewed by me and considered in my medical decision making (see chart for details).    MDM Rules/Calculators/A&P                         Patient here for evaluation of two months of progressive shortness of breath, worse in the mornings. She has associated reflux. Chest x-ray with no evidence of active pneumonia. She is clear lungs with no respiratory distress on evaluation. BNP is within normal limits. CBC with mild anemia, not felt to be contributing to her symptoms. D dimer is negative based off of age adjustment, presentation is not consistent with PE or DVT. UA is concerning for UTI, she is asymptomatic at this time. She does have a history of recurrent cystitis, will treat this with one-time dose of fosphomycin.  Presentation is not consistent with sepsis, serious bacterial infection, infected kidney stone. Discussed with patient that shortness of breath is likely secondary to her ongoing reflux. Discussed changes in diet, elevating the head of her bed. She is currently on Prilosec BID. Discussed continuing this medication. Discussed importance of not eating right after eating this medication for optimal effect. Discussed PCP follow-up and return precautions.  Final Clinical Impression(s) / ED Diagnoses Final diagnoses:  Shortness of breath  Cystitis    Rx / DC Orders ED Discharge Orders    None       Quintella Reichert, MD 08/24/20 2027

## 2020-08-24 NOTE — ED Notes (Signed)
Patients O2 was 99 while lying down. Patient ambulated and O2 went to 96

## 2020-08-26 LAB — URINE CULTURE: Culture: >=100000 — AB

## 2020-08-27 DIAGNOSIS — N3941 Urge incontinence: Secondary | ICD-10-CM | POA: Diagnosis not present

## 2020-09-04 DIAGNOSIS — F411 Generalized anxiety disorder: Secondary | ICD-10-CM | POA: Diagnosis not present

## 2020-09-04 DIAGNOSIS — F5105 Insomnia due to other mental disorder: Secondary | ICD-10-CM | POA: Diagnosis not present

## 2020-09-04 DIAGNOSIS — F3131 Bipolar disorder, current episode depressed, mild: Secondary | ICD-10-CM | POA: Diagnosis not present

## 2020-09-04 DIAGNOSIS — F4310 Post-traumatic stress disorder, unspecified: Secondary | ICD-10-CM | POA: Diagnosis not present

## 2020-09-04 DIAGNOSIS — I10 Essential (primary) hypertension: Secondary | ICD-10-CM | POA: Diagnosis not present

## 2020-09-10 DIAGNOSIS — N390 Urinary tract infection, site not specified: Secondary | ICD-10-CM | POA: Diagnosis not present

## 2020-09-13 ENCOUNTER — Other Ambulatory Visit: Payer: Self-pay | Admitting: Neurology

## 2020-09-17 ENCOUNTER — Ambulatory Visit: Payer: Medicare HMO | Admitting: Neurology

## 2020-09-17 ENCOUNTER — Encounter: Payer: Self-pay | Admitting: Neurology

## 2020-09-17 ENCOUNTER — Other Ambulatory Visit: Payer: Self-pay

## 2020-09-17 VITALS — BP 144/85 | HR 72 | Ht 65.0 in | Wt 211.0 lb

## 2020-09-17 DIAGNOSIS — M4802 Spinal stenosis, cervical region: Secondary | ICD-10-CM | POA: Diagnosis not present

## 2020-09-17 DIAGNOSIS — R269 Unspecified abnormalities of gait and mobility: Secondary | ICD-10-CM | POA: Diagnosis not present

## 2020-09-17 DIAGNOSIS — Z9181 History of falling: Secondary | ICD-10-CM | POA: Diagnosis not present

## 2020-09-17 DIAGNOSIS — R251 Tremor, unspecified: Secondary | ICD-10-CM | POA: Diagnosis not present

## 2020-09-17 NOTE — Progress Notes (Signed)
[facility]  PATIENT: [name] [name] DOB: 05/10/1946  REFERRING DOCTOR OR PCP: Vicenta Aly, FNP SOURCE: Patient, notes from primary care, notes in epic, information about InterStim device, MRI of the cervical spine from 2013  _________________________________   HISTORICAL  CHIEF COMPLAINT:  Chief Complaint  Patient presents with  . Follow-up    RM 13. Last seen 03/19/2020. On metoprolol and sinemet for tremor. Uses cane. Has leg swelling and breathing issues that started two weeks ago. Went to ED and they found bladder infection and treated her for that. She still has ongoing sx for swelling/breathing issues. Has not seen PCP for this. Also complains about sleeping and pain issues are not well controlled. Would like to discuss meds she is on and what could be making her more sedated.    HISTORY OF PRESENT ILLNESS:  [name] [name] is a 75 y.o. woman with tremor/shakes and worsening gait.  Update 09/17/2020 She is unsteady on her feet and has had a few falls.   She usually falls backwards.  She uses a cane.    Balance is especially poor when she first stands up.  She needs to take a few steps before she can fully stand up.     Gait difficulties are worse when she tries to go longer distances.  Tremors in the arms improved on carbidopa/levodopa 25/100 po tid.     Tremors are most noticeable around bedtime.  She notes more tremor in the mouth area and head has tremor.     She is sleepy.  Of note she is on oxycodone 5 mg bid and was on Belbuca but stopped.   She is better  Belbuca  She is noting more swelling in her legs.      Due to an older Bladder stim device, she is unable to get an MRI.    She sees Pain Management for left leg pain, back and neck pain ([place])  Tremor/gait history from early 10/22/2019 She noted tremors in her right > left hand and more difficulty with gait around age 59.    She notes fine tasks like painting her nails, keyboarding holding  a utensil bring the tremor out more.    She notes shaking in her head and neck and sometimes in her body when she wakes up.    She notes upper body jerks when she is drowsy.   She also notes facial movements when she is tired.    She notes reduced gait.   She feels balance is more of a problem than strength.  She hits the walls at times as she walks and notes falling more to the right.    She ha no recent falls.   Steps are normal sized.   She feels her turning is more off balanced..    A couple years ago, she reports a fall in [place] hurting her left side.   She fell without any reason -- no slip or trip.       Data: MRI of the cervical spine from 08/23/2011 showed:Marland Kitchen  There are degenerative changes, worse at C6-C7 but no spinal cord changes or significant spinal stenosis  CT cervical spine 7/15/02021 showed "Cervical disc and facet degeneration with moderate spinal stenosis at C5-6 and mild spinal stenosis at C3-4 and C6-7." and multilevel foraminal narrowing (moderate to the right at C3C4, C4C5, moderate to left at C6C7.     CT head 01/23/2020 showed mild chronic microvascular ischemic changes.    REVIEW  OF SYSTEMS: Constitutional: She reports fevers, chills, weight gain, fatigue, insomnia Eyes: She reports eye pain. Ear, nose and throat: She reports hearing loss with tinnitus on the left. Cardiovascular: No chest pain, palpitations Respiratory:She reports shortness of breath, cough and snoring GastrointestinaI: No nausea, vomiting, diarrhea, abdominal pain, fecal incontinence Genitourinary:She has incontinence and has an InterStim device.  Musculoskeletal:She reports neck pain, back pain, myalgias and joint pain. Integumentary: She reports pruritus and mild.   Neurological: as above Psychiatric: She sees psychiatry for bipolar disorder. Endocrine: She reports feeling hot or cold at times and increased thirst. Hematologic/Lymphatic: No anemia, purpura, petechiae. Allergic/Immunologic: No  itchy/runny eyes, nasal congestion, recent allergic reactions, rashes  ALLERGIES: Allergies  Allergen Reactions  . Diclofenac Sodium Anaphylaxis and Swelling    "almost died" caused neck to swell.   . Diclofenac Sodium Anaphylaxis and Swelling    "almost died" caused neck to swell.   . Nsaids Anaphylaxis  . Penicillins Swelling and Rash    Did it involve swelling of the face/tongue/throat, SOB, or low BP? Y Did it involve sudden or severe rash/hives, skin peeling, or any reaction on the inside of your mouth or nose? Y Did you need to seek medical attention at a hospital or doctor's office? n When did it last happen?more than 10 years ago If all above answers are "NO", may proceed with cephalosporin use.   . Relafen [Nabumetone] Anaphylaxis and Swelling  . Other Other (See Comments) and Swelling    Honey causes throat to swell. Strawberries cause a rash.  "turnes red, swells, and pulls skin off" "turnes red, swells, and pulls skin off"  . Adhesive [Tape] Swelling    "turnes red, swells, and pulls skin off"  . Codeine Nausea And Vomiting    severe  . Ex-Lax [Senna] Other (See Comments)    Pt states "my fingers turned black"  . Trazodone And Nefazodone Other (See Comments)    Possible akathisia  . Neosporin [Neomycin-Bacitracin Zn-Polymyx] Rash    HOME MEDICATIONS:  Current Outpatient Medications:  .  albuterol (PROVENTIL HFA;VENTOLIN HFA) 108 (90 Base) MCG/ACT inhaler, Inhale 2 puffs into the lungs every 4 (four) hours as needed for wheezing or shortness of breath. , Disp: , Rfl:  .  ARIPiprazole (ABILIFY) 15 MG tablet, Take 15 mg by mouth daily., Disp: , Rfl:  .  aspirin 325 MG tablet, Take 325 mg by mouth daily. Takes 1/4 tablet daily, Disp: , Rfl:  .  azelastine (ASTELIN) 0.1 % nasal spray, Place 2 sprays into both nostrils 2 (two) times daily. , Disp: , Rfl:  .  CALCIUM PO, Take 600 mg by mouth daily., Disp: , Rfl:  .  carbidopa-levodopa (SINEMET IR) 25-100 MG tablet,  TAKE ONE TABLET BY MOUTH THREE TIMES A DAY, Disp: 90 tablet, Rfl: 3 .  clonazePAM (KLONOPIN) 0.5 MG tablet, Take 0.5 mg by mouth daily as needed for anxiety., Disp: , Rfl:  .  famotidine (PEPCID) 20 MG tablet, Take 20 mg by mouth daily., Disp: , Rfl:  .  lamoTRIgine (LAMICTAL) 150 MG tablet, Take 1 tablet by mouth 2 (two) times daily. , Disp: , Rfl:  .  levothyroxine (SYNTHROID, LEVOTHROID) 100 MCG tablet, TAKE 1 TABLET(100 MCG) BY MOUTH DAILY BEFORE BREAKFAST (Patient taking differently: Take 100 mcg by mouth daily before breakfast.), Disp: 30 tablet, Rfl: 5 .  losartan (COZAAR) 100 MG tablet, Take 100 mg by mouth daily. , Disp: , Rfl:  .  metoprolol tartrate (LOPRESSOR) 25 MG tablet, Take 25  mg by mouth 2 (two) times daily. , Disp: , Rfl:  .  omeprazole (PRILOSEC) 40 MG capsule, Take 1 capsule (40 mg total) by mouth 2 (two) times daily., Disp: 180 capsule, Rfl: 0 .  oxyCODONE-acetaminophen (PERCOCET/ROXICET) 5-325 MG tablet, Take 1 tablet by mouth in the morning and at bedtime., Disp: , Rfl:  .  polyethylene glycol (MIRALAX / GLYCOLAX) packet, Take 17 g by mouth daily as needed for mild constipation., Disp: , Rfl:  .  pravastatin (PRAVACHOL) 40 MG tablet, Take 40 mg by mouth at bedtime., Disp: , Rfl:  .  pregabalin (LYRICA) 150 MG capsule, Take 150 mg by mouth in the morning, at noon, and at bedtime., Disp: , Rfl:  .  primidone (MYSOLINE) 50 MG tablet, Take 1 tablet (50 mg total) by mouth 3 (three) times daily., Disp: 90 tablet, Rfl: 3 .  QUEtiapine Fumarate (SEROQUEL XR) 150 MG 24 hr tablet, Take 150 mg by mouth at bedtime., Disp: , Rfl:  .  traZODone (DESYREL) 100 MG tablet, Take 100 mg by mouth at bedtime., Disp: , Rfl:  .  venlafaxine (EFFEXOR) 100 MG tablet, TAKE ONE TABLET BY MOUTH DAILY THREE TIMES A DAY, Disp: 90 tablet, Rfl: 0 .  VITAMIN D PO, Take 50,000 Units by mouth once a week., Disp: , Rfl:   PAST MEDICAL HISTORY: Past Medical History:  Diagnosis Date  . Anxiety   . Cervical  radiculopathy   . Chronic back pain    R > L  . Chronic neck pain   . Depression   . Dysphagia    functional --  takes small bites  . Fibromyalgia   . GERD (gastroesophageal reflux disease)   . Hemorrhoids   . History of ankle fracture 01/08/2012   Status post open reduction internal fixation of a left ankle trimalleolar fracture by Dr. Iona Hansen on 01/10/2012.    Marland Kitchen History of benign thyroid tumor   . History of diabetes mellitus, type II    was diet controlled -- per pt pcp she was no longer diabetic  . History of hiatal hernia   . History of TIA (transient ischemic attack)    02-27-2009  no residual  . HTN (hypertension)   . Hyperlipidemia   . Hypothyroidism, postsurgical   . Lumbar radicular pain    R>L legs  . Migraine headache   . Mild intermittent asthma with allergic rhinitis without complication   . PONV (postoperative nausea and vomiting)   . Rotator cuff syndrome of right shoulder   . Urge urinary incontinence    refractory  . Wears glasses     PAST SURGICAL HISTORY: Past Surgical History:  Procedure Laterality Date  . CARDIAC CATHETERIZATION  01-17-2003  dr Darnell Level brodie   normal coronary arteries and LVF,  ef 60%  . EXCISION MORTON'S NEUROMA  2002   bilateral feet  . EXCISION OF BREAST BIOPSY Right 1990's   benign  . INTERSTIM IMPLANT PLACEMENT N/A 02/10/2015   Procedure: Barrie Lyme IMPLANT FIRST STAGE;  Surgeon: Bjorn Loser, MD;  Location: [facility];  Service: Urology;  Laterality: N/A;  . INTERSTIM IMPLANT PLACEMENT N/A 02/10/2015   Procedure: Barrie Lyme IMPLANT SECOND STAGE;  Surgeon: Bjorn Loser, MD;  Location: [facility];  Service: Urology;  Laterality: N/A;  . LAPAROSCOPIC CHOLECYSTECTOMY  2000  . NEGATIVE SLEEP STUDY  2000   per pt  . ORIF ANKLE FRACTURE  01/10/2012   Procedure: OPEN REDUCTION INTERNAL FIXATION (ORIF) ANKLE FRACTURE;  Surgeon: Sanjuana Kava, MD;  Location: [facility];  Service: Orthopedics;   Laterality: Left;  . TOTAL THYROIDECTOMY  1985   benign tumor  . TRANSTHORACIC ECHOCARDIOGRAM  03-02-2009   normal echo,  ef 55-60%  . VAGINAL HYSTERECTOMY  1984  . WRIST SURGERY Left 2000    FAMILY HISTORY: Family History  Problem Relation Age of Onset  . Cancer Other        breast -- grandmother  . Cancer Other        lung -- uncles (3)  . Leukemia Sister   . Diabetes Father   . Coronary artery disease Father   . Diabetes Mother   . Dementia Mother     SOCIAL HISTORY:  Social History   Socioeconomic History  . Marital status: Married    Spouse name: Jameca Chumley  . Number of children: 2  . Years of education: 30  . Highest education level: Not on file  Occupational History  . Occupation: Retired  Tobacco Use  . Smoking status: Never Smoker  . Smokeless tobacco: Never Used  Substance and Sexual Activity  . Alcohol use: No  . Drug use: No  . Sexual activity: Not on file  Other Topics Concern  . Not on file  Social History Narrative   Lives with Jashayla Glatfelter (husband) and Francene Finders (grandson)   Caffeine use: 7 cups per day   Social Determinants of Health   Financial Resource Strain: Not on file  Food Insecurity: Not on file  Transportation Needs: Not on file  Physical Activity: Not on file  Stress: Not on file  Social Connections: Not on file  Intimate Partner Violence: Not on file     PHYSICAL EXAM  Vitals:   09/17/20 1042  BP: (!) 144/85  Pulse: 72  SpO2: 98%  Weight: 211 lb (95.7 kg)  Height: 5\' 5"  (1.651 m)    Body mass index is 35.11 kg/m.   General: The patient is well-developed and well-nourished and in no acute distress  HEENT:  Head is [place]/AT.  Sclera are anicteric.     Skin: Extremities are without rash or edema.   Neurologic Exam  Mental status: The patient is alert and oriented x 3 at the time of the examination. The patient has apparent normal recent and remote memory, with an apparently normal attention span and  concentration ability.   Speech is normal.  Cranial nerves: Extraocular movements are full   Facial symmetry is present.  Facial strength is normal.  No dysarthria is noted.   . No obvious hearing deficits are noted.  Motor: There is a 5 to 6 Hz tremor in the hands that worsens with intention.   Muscle bulk is normal.   Tone is normal. Strength is  5 / 5 in all 4 extremities.   Sensory: Sensory testing is intact to pinprick, soft touch and vibration sensation in the arms.  She reports mildly reduced vibration sensation in the feet.  Coordination: Cerebellar testing reveals good finger-nose-finger and heel-to-shin bilaterally.  Gait and station: Station is normal.   Gait is just slightly wide. Tandem gait is wide .She can turn in 3 steps Romberg is negative.  She has retropulsion with posture disturbed.    Reflexes: Deep tendon reflexes are 3 at knees but no crossed adductors today and 2 at ankles, no clonus .   Plantar responses are flexor.    DIAGNOSTIC DATA (LABS, IMAGING, TESTING) - I reviewed patient records, labs, notes, testing and imaging  myself where available.  Lab Results  Component Value Date   WBC 5.3 08/24/2020   HGB 11.7 (L) 08/24/2020   HCT 37.2 08/24/2020   MCV 95.1 08/24/2020   PLT 250 08/24/2020      Component Value Date/Time   NA 141 08/24/2020 1758   NA 141 06/15/2015 0954   K 4.1 08/24/2020 1758   CL 104 08/24/2020 1758   CO2 25 08/24/2020 1758   GLUCOSE 95 08/24/2020 1758   BUN 9 08/24/2020 1758   BUN 15 06/15/2015 0954   CREATININE 0.75 08/24/2020 1758   CREATININE 0.67 09/18/2014 1145   CALCIUM 9.2 08/24/2020 1758   PROT 7.7 07/25/2018 1417   ALBUMIN 4.9 07/25/2018 1417   AST 20 07/25/2018 1417   ALT 21 07/25/2018 1417   ALKPHOS 98 07/25/2018 1417   BILITOT 0.7 07/25/2018 1417   GFRNONAA >60 08/24/2020 1758   GFRNONAA >89 09/18/2014 1145   GFRAA >60 07/25/2018 1417   GFRAA >89 09/18/2014 1145   Lab Results  Component Value Date   CHOL 170  09/18/2014   HDL 63 09/18/2014   LDLCALC 81 09/18/2014   TRIG 128 09/18/2014   CHOLHDL 2.7 09/18/2014   Lab Results  Component Value Date   HGBA1C 5.8 02/27/2015   Lab Results  Component Value Date   VITAMINB12 506 12/30/2009   Lab Results  Component Value Date   TSH 2.680 06/15/2015       ASSESSMENT AND PLAN  Tremor  Gait disturbance  Risk for falls  Cervical spinal stenosis  1.   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   She had a benefit from Sinemet and will continue.  She also thinks the primidone helped the tremor a little bit.  Since she has had extreme sleepiness (improved since stopping Belbuca), I will have her reduce the primidone to 100 mg a day instead of 150 mg a day.  I also advised her to reduce the pregabalin from 3 pills to 2 pills a day.  Hopefully these changes will help her be more alert during the day.  Further changes in medication could be considered based on response. 2.   She has moderate spinal stenosis but does not have long track signs..   3.   I have discussed that I do not specialize in movement disorders and am not sure if another medication /procedure may help her better.    We discussed I would be happy to refer to a Movement Disorder specialist.   For now she would like to stray with [place].   4.   rtc 6 months   Shaquitta Burbridge A. Felecia Shelling, MD, [facility] 0/86/5784, 69:62 PM Certified in Neurology, Clinical Neurophysiology, Sleep Medicine and Neuroimaging  [facility] [address] [place], [place] [address] [phone]

## 2020-09-22 DIAGNOSIS — N390 Urinary tract infection, site not specified: Secondary | ICD-10-CM | POA: Diagnosis not present

## 2020-09-25 DIAGNOSIS — G894 Chronic pain syndrome: Secondary | ICD-10-CM | POA: Diagnosis not present

## 2020-09-25 DIAGNOSIS — M7711 Lateral epicondylitis, right elbow: Secondary | ICD-10-CM | POA: Diagnosis not present

## 2020-09-25 DIAGNOSIS — Z6833 Body mass index (BMI) 33.0-33.9, adult: Secondary | ICD-10-CM | POA: Diagnosis not present

## 2020-09-25 DIAGNOSIS — G20 Parkinson's disease: Secondary | ICD-10-CM | POA: Diagnosis not present

## 2020-09-25 DIAGNOSIS — M7712 Lateral epicondylitis, left elbow: Secondary | ICD-10-CM | POA: Diagnosis not present

## 2020-09-25 DIAGNOSIS — Z79899 Other long term (current) drug therapy: Secondary | ICD-10-CM | POA: Diagnosis not present

## 2020-09-30 DIAGNOSIS — H2513 Age-related nuclear cataract, bilateral: Secondary | ICD-10-CM | POA: Diagnosis not present

## 2020-10-01 DIAGNOSIS — F3132 Bipolar disorder, current episode depressed, moderate: Secondary | ICD-10-CM | POA: Diagnosis not present

## 2020-10-01 DIAGNOSIS — I10 Essential (primary) hypertension: Secondary | ICD-10-CM | POA: Diagnosis not present

## 2020-10-01 DIAGNOSIS — G894 Chronic pain syndrome: Secondary | ICD-10-CM | POA: Diagnosis not present

## 2020-10-01 DIAGNOSIS — E782 Mixed hyperlipidemia: Secondary | ICD-10-CM | POA: Diagnosis not present

## 2020-10-01 DIAGNOSIS — E039 Hypothyroidism, unspecified: Secondary | ICD-10-CM | POA: Diagnosis not present

## 2020-10-01 DIAGNOSIS — Z0000 Encounter for general adult medical examination without abnormal findings: Secondary | ICD-10-CM | POA: Diagnosis not present

## 2020-10-01 DIAGNOSIS — R7301 Impaired fasting glucose: Secondary | ICD-10-CM | POA: Diagnosis not present

## 2020-10-01 DIAGNOSIS — Z79899 Other long term (current) drug therapy: Secondary | ICD-10-CM | POA: Diagnosis not present

## 2020-10-01 DIAGNOSIS — R251 Tremor, unspecified: Secondary | ICD-10-CM | POA: Diagnosis not present

## 2020-10-15 DIAGNOSIS — G894 Chronic pain syndrome: Secondary | ICD-10-CM | POA: Diagnosis not present

## 2020-10-15 DIAGNOSIS — Z79899 Other long term (current) drug therapy: Secondary | ICD-10-CM | POA: Diagnosis not present

## 2020-10-15 DIAGNOSIS — F3132 Bipolar disorder, current episode depressed, moderate: Secondary | ICD-10-CM | POA: Diagnosis not present

## 2020-10-15 DIAGNOSIS — R251 Tremor, unspecified: Secondary | ICD-10-CM | POA: Diagnosis not present

## 2020-10-15 DIAGNOSIS — E039 Hypothyroidism, unspecified: Secondary | ICD-10-CM | POA: Diagnosis not present

## 2020-10-15 DIAGNOSIS — E782 Mixed hyperlipidemia: Secondary | ICD-10-CM | POA: Diagnosis not present

## 2020-10-15 DIAGNOSIS — R7301 Impaired fasting glucose: Secondary | ICD-10-CM | POA: Diagnosis not present

## 2020-10-15 DIAGNOSIS — I10 Essential (primary) hypertension: Secondary | ICD-10-CM | POA: Diagnosis not present

## 2020-10-15 DIAGNOSIS — G4719 Other hypersomnia: Secondary | ICD-10-CM | POA: Diagnosis not present

## 2020-10-22 DIAGNOSIS — F3131 Bipolar disorder, current episode depressed, mild: Secondary | ICD-10-CM | POA: Diagnosis not present

## 2020-10-26 DIAGNOSIS — Z79899 Other long term (current) drug therapy: Secondary | ICD-10-CM | POA: Diagnosis not present

## 2020-10-26 DIAGNOSIS — G20 Parkinson's disease: Secondary | ICD-10-CM | POA: Diagnosis not present

## 2020-10-26 DIAGNOSIS — Z6833 Body mass index (BMI) 33.0-33.9, adult: Secondary | ICD-10-CM | POA: Diagnosis not present

## 2020-10-26 DIAGNOSIS — M7712 Lateral epicondylitis, left elbow: Secondary | ICD-10-CM | POA: Diagnosis not present

## 2020-10-26 DIAGNOSIS — M7711 Lateral epicondylitis, right elbow: Secondary | ICD-10-CM | POA: Diagnosis not present

## 2020-10-26 DIAGNOSIS — G894 Chronic pain syndrome: Secondary | ICD-10-CM | POA: Diagnosis not present

## 2020-11-18 DIAGNOSIS — H2513 Age-related nuclear cataract, bilateral: Secondary | ICD-10-CM | POA: Diagnosis not present

## 2020-11-24 DIAGNOSIS — G20 Parkinson's disease: Secondary | ICD-10-CM | POA: Diagnosis not present

## 2020-11-24 DIAGNOSIS — Z6833 Body mass index (BMI) 33.0-33.9, adult: Secondary | ICD-10-CM | POA: Diagnosis not present

## 2020-11-24 DIAGNOSIS — G894 Chronic pain syndrome: Secondary | ICD-10-CM | POA: Diagnosis not present

## 2020-11-24 DIAGNOSIS — M7712 Lateral epicondylitis, left elbow: Secondary | ICD-10-CM | POA: Diagnosis not present

## 2020-11-24 DIAGNOSIS — Z79899 Other long term (current) drug therapy: Secondary | ICD-10-CM | POA: Diagnosis not present

## 2020-11-24 DIAGNOSIS — M7711 Lateral epicondylitis, right elbow: Secondary | ICD-10-CM | POA: Diagnosis not present

## 2020-12-02 DIAGNOSIS — K5909 Other constipation: Secondary | ICD-10-CM | POA: Diagnosis not present

## 2020-12-02 DIAGNOSIS — G20 Parkinson's disease: Secondary | ICD-10-CM | POA: Diagnosis not present

## 2020-12-02 DIAGNOSIS — K219 Gastro-esophageal reflux disease without esophagitis: Secondary | ICD-10-CM | POA: Diagnosis not present

## 2020-12-02 DIAGNOSIS — K2270 Barrett's esophagus without dysplasia: Secondary | ICD-10-CM | POA: Diagnosis not present

## 2020-12-17 DIAGNOSIS — H2512 Age-related nuclear cataract, left eye: Secondary | ICD-10-CM | POA: Diagnosis not present

## 2020-12-17 DIAGNOSIS — H25812 Combined forms of age-related cataract, left eye: Secondary | ICD-10-CM | POA: Diagnosis not present

## 2020-12-22 DIAGNOSIS — N3011 Interstitial cystitis (chronic) with hematuria: Secondary | ICD-10-CM | POA: Diagnosis not present

## 2020-12-24 DIAGNOSIS — F5105 Insomnia due to other mental disorder: Secondary | ICD-10-CM | POA: Diagnosis not present

## 2020-12-24 DIAGNOSIS — I10 Essential (primary) hypertension: Secondary | ICD-10-CM | POA: Diagnosis not present

## 2020-12-24 DIAGNOSIS — F411 Generalized anxiety disorder: Secondary | ICD-10-CM | POA: Diagnosis not present

## 2020-12-24 DIAGNOSIS — F3132 Bipolar disorder, current episode depressed, moderate: Secondary | ICD-10-CM | POA: Diagnosis not present

## 2020-12-24 DIAGNOSIS — F4310 Post-traumatic stress disorder, unspecified: Secondary | ICD-10-CM | POA: Diagnosis not present

## 2020-12-31 DIAGNOSIS — F3132 Bipolar disorder, current episode depressed, moderate: Secondary | ICD-10-CM | POA: Diagnosis not present

## 2021-01-10 ENCOUNTER — Other Ambulatory Visit: Payer: Self-pay | Admitting: Neurology

## 2021-01-13 DIAGNOSIS — Z6834 Body mass index (BMI) 34.0-34.9, adult: Secondary | ICD-10-CM | POA: Diagnosis not present

## 2021-01-13 DIAGNOSIS — G894 Chronic pain syndrome: Secondary | ICD-10-CM | POA: Diagnosis not present

## 2021-01-13 DIAGNOSIS — G20 Parkinson's disease: Secondary | ICD-10-CM | POA: Diagnosis not present

## 2021-01-13 DIAGNOSIS — M7712 Lateral epicondylitis, left elbow: Secondary | ICD-10-CM | POA: Diagnosis not present

## 2021-01-13 DIAGNOSIS — Z79899 Other long term (current) drug therapy: Secondary | ICD-10-CM | POA: Diagnosis not present

## 2021-01-13 DIAGNOSIS — M7711 Lateral epicondylitis, right elbow: Secondary | ICD-10-CM | POA: Diagnosis not present

## 2021-02-02 DIAGNOSIS — N3011 Interstitial cystitis (chronic) with hematuria: Secondary | ICD-10-CM | POA: Diagnosis not present

## 2021-02-16 DIAGNOSIS — Z6834 Body mass index (BMI) 34.0-34.9, adult: Secondary | ICD-10-CM | POA: Diagnosis not present

## 2021-02-16 DIAGNOSIS — Z79899 Other long term (current) drug therapy: Secondary | ICD-10-CM | POA: Diagnosis not present

## 2021-02-16 DIAGNOSIS — M7712 Lateral epicondylitis, left elbow: Secondary | ICD-10-CM | POA: Diagnosis not present

## 2021-02-16 DIAGNOSIS — G894 Chronic pain syndrome: Secondary | ICD-10-CM | POA: Diagnosis not present

## 2021-02-16 DIAGNOSIS — Z20822 Contact with and (suspected) exposure to covid-19: Secondary | ICD-10-CM | POA: Diagnosis not present

## 2021-02-16 DIAGNOSIS — G20 Parkinson's disease: Secondary | ICD-10-CM | POA: Diagnosis not present

## 2021-02-16 DIAGNOSIS — M7711 Lateral epicondylitis, right elbow: Secondary | ICD-10-CM | POA: Diagnosis not present

## 2021-02-25 DIAGNOSIS — F5105 Insomnia due to other mental disorder: Secondary | ICD-10-CM | POA: Diagnosis not present

## 2021-02-25 DIAGNOSIS — F411 Generalized anxiety disorder: Secondary | ICD-10-CM | POA: Diagnosis not present

## 2021-02-25 DIAGNOSIS — F3131 Bipolar disorder, current episode depressed, mild: Secondary | ICD-10-CM | POA: Diagnosis not present

## 2021-03-02 DIAGNOSIS — F3131 Bipolar disorder, current episode depressed, mild: Secondary | ICD-10-CM | POA: Diagnosis not present

## 2021-03-04 DIAGNOSIS — N3011 Interstitial cystitis (chronic) with hematuria: Secondary | ICD-10-CM | POA: Diagnosis not present

## 2021-03-11 DIAGNOSIS — N3011 Interstitial cystitis (chronic) with hematuria: Secondary | ICD-10-CM | POA: Diagnosis not present

## 2021-03-19 DIAGNOSIS — Z79899 Other long term (current) drug therapy: Secondary | ICD-10-CM | POA: Diagnosis not present

## 2021-03-19 DIAGNOSIS — M7711 Lateral epicondylitis, right elbow: Secondary | ICD-10-CM | POA: Diagnosis not present

## 2021-03-19 DIAGNOSIS — Z6834 Body mass index (BMI) 34.0-34.9, adult: Secondary | ICD-10-CM | POA: Diagnosis not present

## 2021-03-19 DIAGNOSIS — M7712 Lateral epicondylitis, left elbow: Secondary | ICD-10-CM | POA: Diagnosis not present

## 2021-03-19 DIAGNOSIS — R030 Elevated blood-pressure reading, without diagnosis of hypertension: Secondary | ICD-10-CM | POA: Diagnosis not present

## 2021-03-19 DIAGNOSIS — G894 Chronic pain syndrome: Secondary | ICD-10-CM | POA: Diagnosis not present

## 2021-03-19 DIAGNOSIS — G20 Parkinson's disease: Secondary | ICD-10-CM | POA: Diagnosis not present

## 2021-03-25 ENCOUNTER — Ambulatory Visit: Payer: Medicare HMO | Admitting: Neurology

## 2021-03-25 ENCOUNTER — Telehealth: Payer: Self-pay | Admitting: Neurology

## 2021-03-25 ENCOUNTER — Encounter: Payer: Self-pay | Admitting: Neurology

## 2021-03-25 VITALS — BP 144/88 | HR 60 | Ht 66.0 in | Wt 210.0 lb

## 2021-03-25 DIAGNOSIS — N3011 Interstitial cystitis (chronic) with hematuria: Secondary | ICD-10-CM | POA: Diagnosis not present

## 2021-03-25 DIAGNOSIS — M4802 Spinal stenosis, cervical region: Secondary | ICD-10-CM

## 2021-03-25 DIAGNOSIS — Z9181 History of falling: Secondary | ICD-10-CM

## 2021-03-25 DIAGNOSIS — R269 Unspecified abnormalities of gait and mobility: Secondary | ICD-10-CM

## 2021-03-25 DIAGNOSIS — R251 Tremor, unspecified: Secondary | ICD-10-CM

## 2021-03-25 MED ORDER — PRIMIDONE 50 MG PO TABS: 50.0000 mg | ORAL_TABLET | Freq: Three times a day (TID) | ORAL | 5 refills | Status: DC

## 2021-03-25 NOTE — Addendum Note (Signed)
Addended by: Arlice Colt A on: 03/25/2021 11:51 AM   Modules accepted: Orders

## 2021-03-25 NOTE — Progress Notes (Signed)
[facility]  PATIENT: [name] [name] DOB: Jul 02, 1946  REFERRING DOCTOR OR PCP: Vicenta Aly, FNP SOURCE: Patient, notes from primary care, notes in epic, information about InterStim device, MRI of the cervical spine from 2013  _________________________________   HISTORICAL  CHIEF COMPLAINT:  Chief Complaint  Patient presents with   Follow-up    Rm 2, w husband. Here for 6 month tremor f/u. Pt reports tremors have been worse, in L arm and hand. Pt concerned with medication SE.    HISTORY OF PRESENT ILLNESS:  [name] [name] is a 75 y.o. woman with tremor/shakes and worsening gait.  Update 03/25/2021 She notes more tremor in the mouth area and head has tremor.     She feels the tremor is better with primidone 50 mg po tid and Sinemet 25/100 tid and metoprolol 25 mg po bid.      She takes clonazepam for anxiety as needed and she notes the tremor is better when she takes it.     She tolerates these medications well.   She is better than last year.    Tremor is worse with intention and emotion (being upset)  She is unsteady on her feet but no recent fall.     Due to an older Bladder stim device, she is unable to get an MRI.    She sees Pain Management for left leg pain, back and neck pain ([place]).  She is on oxycodone 5 mg bid for her back pain with benefit  Tremor/gait history from early 10/22/2019 She noted tremors in her right > left hand and more difficulty with gait around age 55.    She notes fine tasks like painting her nails, keyboarding holding a utensil bring the tremor out more.    She notes shaking in her head and neck and sometimes in her body when she wakes up.    She notes upper body jerks when she is drowsy.   She also notes facial movements when she is tired.    She notes red +uced gait.   She feels balance is more of a problem than strength.  She hits the walls at times as she walks and notes falling more to the right.    She ha no recent  falls.   Steps are normal sized.   She feels her turning is more off balanced..    A couple years ago, she [place] a [place] hurting [place] left side.   She fell without any reason -- no slip or trip.       Data: MRI of the cervical spine from 08/23/2011 showed:Marland Kitchen  There are degenerative changes, worse at C6-C7 but no spinal cord changes or significant spinal stenosis  CT cervical spine 7/15/02021 showed "Cervical disc and facet degeneration with moderate spinal stenosis at C5-6 and mild spinal stenosis at C3-4 and C6-7." and multilevel foraminal narrowing (moderate to the right at C3C4, C4C5, moderate to left at C6C7.     CT head 01/23/2020 showed mild chronic microvascular ischemic changes.    REVIEW OF SYSTEMS: Constitutional: She reports fevers, chills, weight gain, fatigue, insomnia Eyes: She reports eye pain. Ear, nose and throat: She reports hearing loss with tinnitus on the left. Cardiovascular: No chest pain, palpitations Respiratory: She reports shortness of breath, cough and snoring GastrointestinaI: No nausea, vomiting, diarrhea, abdominal pain, fecal incontinence Genitourinary: She has incontinence and has an InterStim device.  Musculoskeletal: She reports neck pain, back pain, myalgias and joint pain. Integumentary:  She reports pruritus and mild.   Neurological: as above Psychiatric: She sees psychiatry for bipolar disorder. Endocrine: She reports feeling hot or cold at times and increased thirst. Hematologic/Lymphatic:  No anemia, purpura, petechiae. Allergic/Immunologic: No itchy/runny eyes, nasal congestion, recent allergic reactions, rashes  ALLERGIES: Allergies  Allergen Reactions   Diclofenac Sodium Anaphylaxis and Swelling    "almost died" caused neck to swell.    Diclofenac Sodium Anaphylaxis and Swelling    "almost died" caused neck to swell.    Nsaids Anaphylaxis   Penicillins Swelling and Rash    Did it involve swelling of the face/tongue/throat, SOB, or low  BP? Y Did it involve sudden or severe rash/hives, skin peeling, or any reaction on the inside of your mouth or nose? Y Did you need to seek medical attention at a hospital or doctor's office? n When did it last happen?   more than 10 years ago If all above answers are "NO", may proceed with cephalosporin use.    Relafen [Nabumetone] Anaphylaxis and Swelling   Other Other (See Comments) and Swelling    Honey causes throat to swell. Strawberries cause a rash.  "turnes red, swells, and pulls skin off" "turnes red, swells, and pulls skin off"   Adhesive [Tape] Swelling    "turnes red, swells, and pulls skin off"   Codeine Nausea And Vomiting    severe   Ex-Lax [Senna] Other (See Comments)    Pt states "my fingers turned black"   Trazodone And Nefazodone Other (See Comments)    Possible akathisia   Neosporin [Neomycin-Bacitracin Zn-Polymyx] Rash    HOME MEDICATIONS:  Current Outpatient Medications:    albuterol (PROVENTIL HFA;VENTOLIN HFA) 108 (90 Base) MCG/ACT inhaler, Inhale 2 puffs into the lungs every 4 (four) hours as needed for wheezing or shortness of breath. , Disp: , Rfl:    ARIPiprazole (ABILIFY) 15 MG tablet, Take 15 mg by mouth daily., Disp: , Rfl:    aspirin 325 MG tablet, Take 325 mg by mouth daily. Takes 1/4 tablet daily, Disp: , Rfl:    CALCIUM PO, Take 600 mg by mouth daily., Disp: , Rfl:    carbidopa-levodopa (SINEMET IR) 25-100 MG tablet, TAKE ONE TABLET BY MOUTH THREE TIMES A DAY, Disp: 90 tablet, Rfl: 5   clonazePAM (KLONOPIN) 0.5 MG tablet, Take 0.5 mg by mouth daily as needed for anxiety., Disp: , Rfl:    famotidine (PEPCID) 20 MG tablet, Take 20 mg by mouth daily., Disp: , Rfl:    levothyroxine (SYNTHROID, LEVOTHROID) 100 MCG tablet, TAKE 1 TABLET(100 MCG) BY MOUTH DAILY BEFORE BREAKFAST (Patient taking differently: Take 100 mcg by mouth daily before breakfast.), Disp: 30 tablet, Rfl: 5   losartan (COZAAR) 100 MG tablet, Take 100 mg by mouth daily. , Disp: , Rfl:     metoprolol tartrate (LOPRESSOR) 25 MG tablet, Take 25 mg by mouth 2 (two) times daily. , Disp: , Rfl:    omeprazole (PRILOSEC) 40 MG capsule, Take 1 capsule (40 mg total) by mouth 2 (two) times daily., Disp: 180 capsule, Rfl: 0   oxyCODONE-acetaminophen (PERCOCET/ROXICET) 5-325 MG tablet, Take 1 tablet by mouth in the morning and at bedtime., Disp: , Rfl:    polyethylene glycol (MIRALAX / GLYCOLAX) packet, Take 17 g by mouth daily as needed for mild constipation., Disp: , Rfl:    pravastatin (PRAVACHOL) 40 MG tablet, Take 40 mg by mouth at bedtime., Disp: , Rfl:    pregabalin (LYRICA) 150 MG capsule, Take 150 mg by mouth  in the morning, at noon, and at bedtime., Disp: , Rfl:    QUEtiapine Fumarate (SEROQUEL XR) 150 MG 24 hr tablet, Take 150 mg by mouth at bedtime., Disp: , Rfl:    traZODone (DESYREL) 100 MG tablet, Take 100 mg by mouth at bedtime., Disp: , Rfl:    venlafaxine (EFFEXOR) 100 MG tablet, TAKE ONE TABLET BY MOUTH DAILY THREE TIMES A DAY, Disp: 90 tablet, Rfl: 0   VITAMIN D PO, Take 50,000 Units by mouth once a week., Disp: , Rfl:    lamoTRIgine (LAMICTAL) 150 MG tablet, Take 1 tablet by mouth 2 (two) times daily. , Disp: , Rfl:    primidone (MYSOLINE) 50 MG tablet, Take 1 tablet (50 mg total) by mouth 3 (three) times daily., Disp: 90 tablet, Rfl: 5  PAST MEDICAL HISTORY: Past Medical History:  Diagnosis Date   Anxiety    Cervical radiculopathy    Chronic back pain    R > L   Chronic neck pain    Depression    Dysphagia    functional --  takes small bites   Fibromyalgia    GERD (gastroesophageal reflux disease)    Hemorrhoids    History of ankle fracture 01/08/2012   Status post open reduction internal fixation of a left ankle trimalleolar fracture by Dr. Iona Hansen on 01/10/2012.     History of benign thyroid tumor    History of diabetes mellitus, type II    was diet controlled -- per pt pcp she was no longer diabetic   History of hiatal hernia    History of TIA  (transient ischemic attack)    02-27-2009  no residual   HTN (hypertension)    Hyperlipidemia    Hypothyroidism, postsurgical    Lumbar radicular pain    R>L legs   Migraine headache    Mild intermittent asthma with allergic rhinitis without complication    PONV (postoperative nausea and vomiting)    Rotator cuff syndrome of right shoulder    Urge urinary incontinence    refractory   Wears glasses     PAST SURGICAL HISTORY: Past Surgical History:  Procedure Laterality Date   CARDIAC CATHETERIZATION  01-17-2003  dr Darnell Level brodie   normal coronary arteries and LVF,  ef 60%   EXCISION MORTON'S NEUROMA  2002   bilateral feet   EXCISION OF BREAST BIOPSY Right 1990's   benign   INTERSTIM IMPLANT PLACEMENT N/A 02/10/2015   Procedure: Barrie Lyme IMPLANT FIRST STAGE;  Surgeon: Bjorn Loser, MD;  Location[facility];  Service: Urology;  Laterality: N/A;   INTERSTIM IMPLANT PLACEMENT N/A 02/10/2015   Procedure: Barrie Lyme IMPLANT SECOND STAGE;  Surgeon: Bjorn Loser, MD;  Location: [facility];  Service: Urology;  Laterality: N/A;   LAPAROSCOPIC CHOLECYSTECTOMY  2000   NEGATIVE SLEEP STUDY  2000   per pt   ORIF ANKLE FRACTURE  01/10/2012   Procedure: OPEN REDUCTION INTERNAL FIXATION (ORIF) ANKLE FRACTURE;  Surgeon: Sanjuana Kava, MD;  Location: [facility];  Service: Orthopedics;  Laterality: Left;   TOTAL THYROIDECTOMY  1985   benign tumor   TRANSTHORACIC ECHOCARDIOGRAM  03-02-2009   normal echo,  ef 55-60%   VAGINAL HYSTERECTOMY  1984   WRIST SURGERY Left 2000    FAMILY HISTORY: Family History  Problem Relation Age of Onset   Cancer Other        breast -- grandmother   Cancer Other        lung -- uncles (  3)   Leukemia Sister    Diabetes Father    Coronary artery disease Father    Diabetes Mother    Dementia Mother     SOCIAL HISTORY:  Social History   Socioeconomic History   Marital status: Married    Spouse name: Kassandra Halloran   Number of  children: 2   Years of education: 12   Highest education level: Not on file  Occupational History   Occupation: Retired  Tobacco Use   Smoking status: Never   Smokeless tobacco: Never  Substance and Sexual Activity   Alcohol use: No   Drug use: No   Sexual activity: Not on file  Other Topics Concern   Not on file  Social History Narrative   Lives with Tarasa Steck (husband) and Interior and spatial designer (grandson)   Caffeine use: 7 cups per day   Social Determinants of Health   Financial Resource Strain: Not on file  Food Insecurity: Not on file  Transportation Needs: Not on file  Physical Activity: Not on file  Stress: Not on file  Social Connections: Not on file  Intimate Partner Violence: Not on file     PHYSICAL EXAM  Vitals:   03/25/21 1037  BP: (!) 144/88  Pulse: 60  Weight: 210 lb (95.3 kg)  Height: '5\' 6"'$  (1.676 m)    Body mass index is 33.89 kg/m.   General: The patient is well-developed and well-nourished and in no acute distress  HEENT:  Head is [place]/AT.  Sclera are anicteric.     Skin: Extremities are without rash or edema.   Neurologic Exam  Mental status: The patient is alert and oriented x 3 at the time of the examination. The patient has apparent normal recent and remote memory, with an apparently normal attention span and concentration ability.   Speech is normal.  Cranial nerves: Extraocular movements are full   Facial symmetry is present.  Facial strength is normal.  No dysarthria is noted.   . No obvious hearing deficits are noted.  Motor: There is a 5 to 6 Hz tremor in the hands that worsens with intention.   Muscle bulk is normal.   Tone is normal. Strength is  5 / 5 in all 4 extremities.   Sensory: Sensory testing is intact to touch and vib  Coordination: Cerebellar testing reveals good finger-nose-finger and heel-to-shin bilaterally.  Gait and station: Station is normal.   Gait is just slightly wide. Tandem gait is moderately wide .She can turn in  3 steps Romberg is negative.  She has retropulsion with posture disturbed.    Reflexes: Deep tendon reflexes are 3 at knees .  N ankle clonus .      DIAGNOSTIC DATA (LABS, IMAGING, TESTING) - I reviewed patient records, labs, notes, testing and imaging myself where available.  Lab Results  Component Value Date   WBC 5.3 08/24/2020   HGB 11.7 (L) 08/24/2020   HCT 37.2 08/24/2020   MCV 95.1 08/24/2020   PLT 250 08/24/2020      Component Value Date/Time   NA 141 08/24/2020 1758   NA 141 06/15/2015 0954   K 4.1 08/24/2020 1758   CL 104 08/24/2020 1758   CO2 25 08/24/2020 1758   GLUCOSE 95 08/24/2020 1758   BUN 9 08/24/2020 1758   BUN 15 06/15/2015 0954   CREATININE 0.75 08/24/2020 1758   CREATININE 0.67 09/18/2014 1145   CALCIUM 9.2 08/24/2020 1758   PROT 7.7 07/25/2018 1417   ALBUMIN 4.9  07/25/2018 1417   AST 20 07/25/2018 1417   ALT 21 07/25/2018 1417   ALKPHOS 98 07/25/2018 1417   BILITOT 0.7 07/25/2018 1417   GFRNONAA >60 08/24/2020 1758   GFRNONAA >89 09/18/2014 1145   GFRAA >60 07/25/2018 1417   GFRAA >89 09/18/2014 1145   Lab Results  Component Value Date   CHOL 170 09/18/2014   HDL 63 09/18/2014   LDLCALC 81 09/18/2014   TRIG 128 09/18/2014   CHOLHDL 2.7 09/18/2014   Lab Results  Component Value Date   HGBA1C 5.8 02/27/2015   Lab Results  Component Value Date   [id number] 12/30/2009   Lab Results  Component Value Date   TSH 2.680 06/15/2015       ASSESSMENT AND PLAN  Tremor  Gait disturbance  Risk for falls  Cervical spinal stenosis  1.   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   We will get a DATScan if possible to help determine whether she has PD or BET.   Figuring this out will help [place] optimize her therapy.  2.   She had a benefit from Sinemet and will continue along with primidone 150 mg a day. And metoprolol 3.   She has moderate spinal stenosis but does not have long track  signs..   4.    We discussed I would be happy to refer to a Movement Disorder specialist.   For now she would like to stray with [place].   5.   rtc 6 months   Mariaelena Cade A. Felecia Shelling, MD, [facility] [id number], [id number] AM Certified in Neurology, Clinical Neurophysiology, Sleep Medicine and Neuroimaging  [facility] [address], [place], [place] [address] [phone]

## 2021-03-25 NOTE — Telephone Encounter (Signed)
Pending faxed all the information to [employer] who obtains the authroziaton.

## 2021-03-29 NOTE — Telephone Encounter (Signed)
No Josem Kaufmann is require through [facility]. Sent Amber a message she will reach out to the patient to schedule.

## 2021-03-31 DIAGNOSIS — E039 Hypothyroidism, unspecified: Secondary | ICD-10-CM | POA: Diagnosis not present

## 2021-03-31 DIAGNOSIS — J4520 Mild intermittent asthma, uncomplicated: Secondary | ICD-10-CM | POA: Diagnosis not present

## 2021-03-31 DIAGNOSIS — G894 Chronic pain syndrome: Secondary | ICD-10-CM | POA: Diagnosis not present

## 2021-03-31 DIAGNOSIS — K219 Gastro-esophageal reflux disease without esophagitis: Secondary | ICD-10-CM | POA: Diagnosis not present

## 2021-03-31 DIAGNOSIS — F3132 Bipolar disorder, current episode depressed, moderate: Secondary | ICD-10-CM | POA: Diagnosis not present

## 2021-03-31 DIAGNOSIS — K5909 Other constipation: Secondary | ICD-10-CM | POA: Diagnosis not present

## 2021-03-31 DIAGNOSIS — I10 Essential (primary) hypertension: Secondary | ICD-10-CM | POA: Diagnosis not present

## 2021-04-01 DIAGNOSIS — N3011 Interstitial cystitis (chronic) with hematuria: Secondary | ICD-10-CM | POA: Diagnosis not present

## 2021-04-06 DIAGNOSIS — F3131 Bipolar disorder, current episode depressed, mild: Secondary | ICD-10-CM | POA: Diagnosis not present

## 2021-04-19 DIAGNOSIS — Z0000 Encounter for general adult medical examination without abnormal findings: Secondary | ICD-10-CM | POA: Diagnosis not present

## 2021-04-19 DIAGNOSIS — R030 Elevated blood-pressure reading, without diagnosis of hypertension: Secondary | ICD-10-CM | POA: Diagnosis not present

## 2021-04-19 DIAGNOSIS — G20 Parkinson's disease: Secondary | ICD-10-CM | POA: Diagnosis not present

## 2021-04-19 DIAGNOSIS — Z6834 Body mass index (BMI) 34.0-34.9, adult: Secondary | ICD-10-CM | POA: Diagnosis not present

## 2021-04-19 DIAGNOSIS — M7711 Lateral epicondylitis, right elbow: Secondary | ICD-10-CM | POA: Diagnosis not present

## 2021-04-19 DIAGNOSIS — G894 Chronic pain syndrome: Secondary | ICD-10-CM | POA: Diagnosis not present

## 2021-04-19 DIAGNOSIS — Z9181 History of falling: Secondary | ICD-10-CM | POA: Diagnosis not present

## 2021-04-19 DIAGNOSIS — Z79899 Other long term (current) drug therapy: Secondary | ICD-10-CM | POA: Diagnosis not present

## 2021-04-19 DIAGNOSIS — M7712 Lateral epicondylitis, left elbow: Secondary | ICD-10-CM | POA: Diagnosis not present

## 2021-04-22 DIAGNOSIS — N3011 Interstitial cystitis (chronic) with hematuria: Secondary | ICD-10-CM | POA: Diagnosis not present

## 2021-04-27 ENCOUNTER — Encounter (HOSPITAL_COMMUNITY): Admission: RE | Admit: 2021-04-27 | Payer: Medicare HMO | Source: Ambulatory Visit

## 2021-04-27 ENCOUNTER — Encounter (HOSPITAL_COMMUNITY): Payer: Medicare HMO

## 2021-05-18 ENCOUNTER — Encounter (HOSPITAL_COMMUNITY)
Admission: RE | Admit: 2021-05-18 | Discharge: 2021-05-18 | Disposition: A | Discharge status: Home / Self Care | Payer: Medicare HMO | Source: Ambulatory Visit | Attending: Neurology | Admitting: Neurology

## 2021-05-18 ENCOUNTER — Other Ambulatory Visit: Payer: Self-pay

## 2021-05-18 DIAGNOSIS — R269 Unspecified abnormalities of gait and mobility: Secondary | ICD-10-CM | POA: Diagnosis present

## 2021-05-18 DIAGNOSIS — R251 Tremor, unspecified: Secondary | ICD-10-CM | POA: Insufficient documentation

## 2021-05-18 MED ORDER — POTASSIUM IODIDE (ANTIDOTE) 130 MG PO TABS
ORAL_TABLET | ORAL | Status: AC
  Administered 2021-05-18: 130 mg via ORAL
  Filled 2021-05-18: qty 1

## 2021-05-18 MED ORDER — IOFLUPANE I 123 185 MBQ/2.5ML IV SOLN
4.0000 | Freq: Once | INTRAVENOUS | Status: AC | PRN
  Administered 2021-05-18: 4 via INTRAVENOUS

## 2021-05-18 MED ORDER — POTASSIUM IODIDE (ANTIDOTE) 130 MG PO TABS: 130.0000 mg | ORAL_TABLET | Freq: Once | ORAL | Status: AC

## 2021-06-10 ENCOUNTER — Telehealth: Payer: Self-pay | Admitting: Neurology

## 2021-06-10 NOTE — Telephone Encounter (Signed)
Patient had her DaTscan on November 8th at [facility], she stated that her insurance is saying a prior authorization is require. I called her back and informed her that I have all the documentation that they stated no authorization is require. She asked if I mail the information to her. I stated that I will mail it at and I verified her mailing address with her.

## 2021-07-21 ENCOUNTER — Other Ambulatory Visit: Payer: Self-pay | Admitting: Neurology

## 2021-08-19 IMAGING — DX DG LUMBAR SPINE 2-3V
3 series · 3 of 3 positions shown · non-contrast
Comparison: [DATE]

CLINICAL DATA: 73 y.o female. Chronic midline thoracic back
pain/low back pain/ Lumbosacral Spondylosis/ Lumbar Facet
Arthropathy

EXAM:
LUMBAR SPINE - 2-3 VIEW

[l-spine ap]
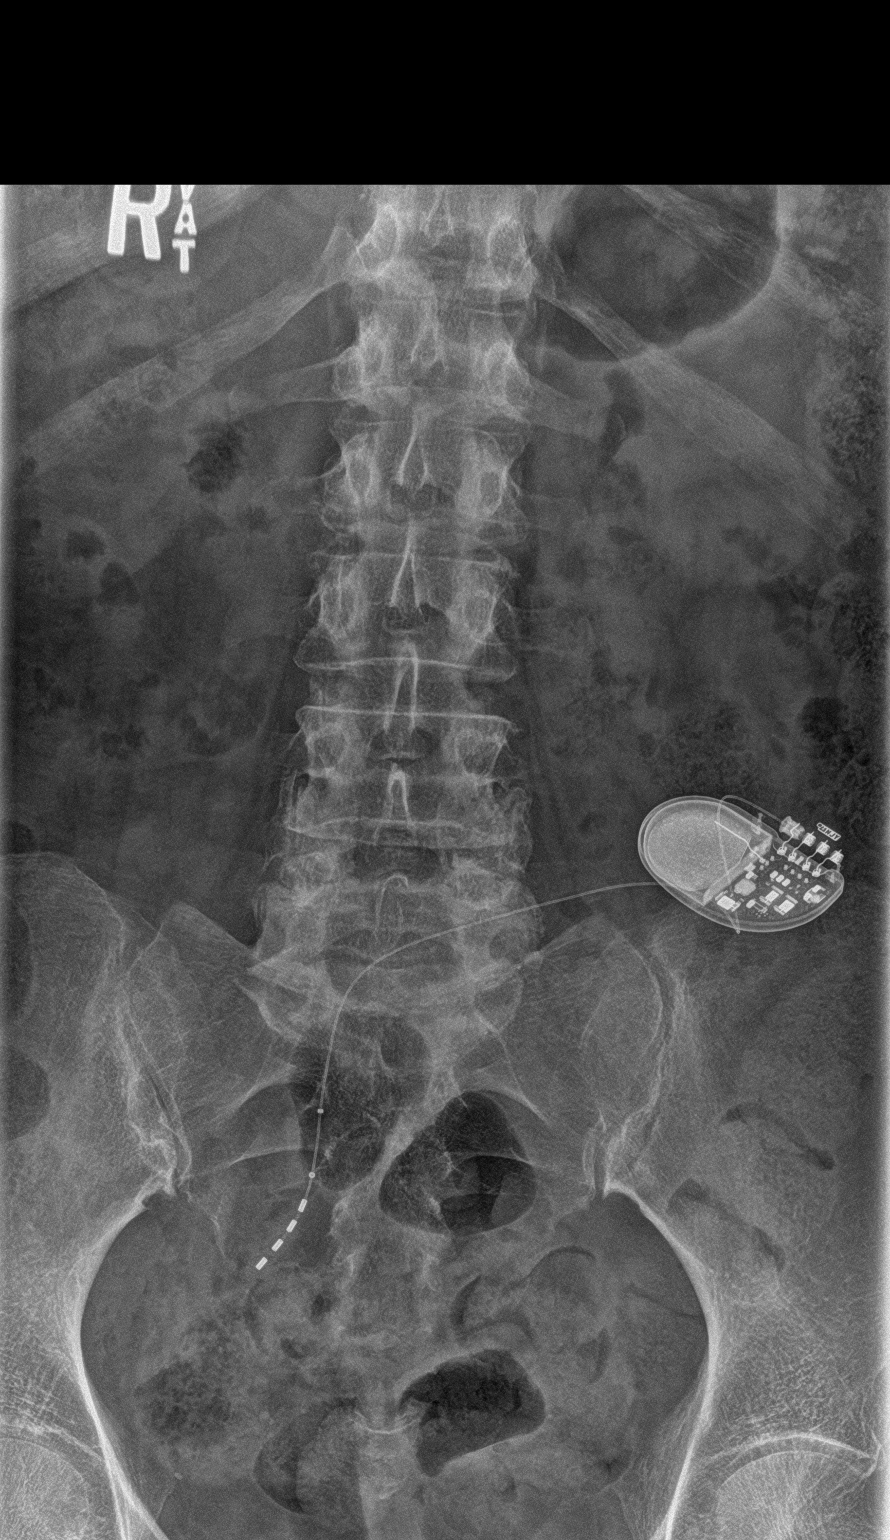

[l-spine lat]
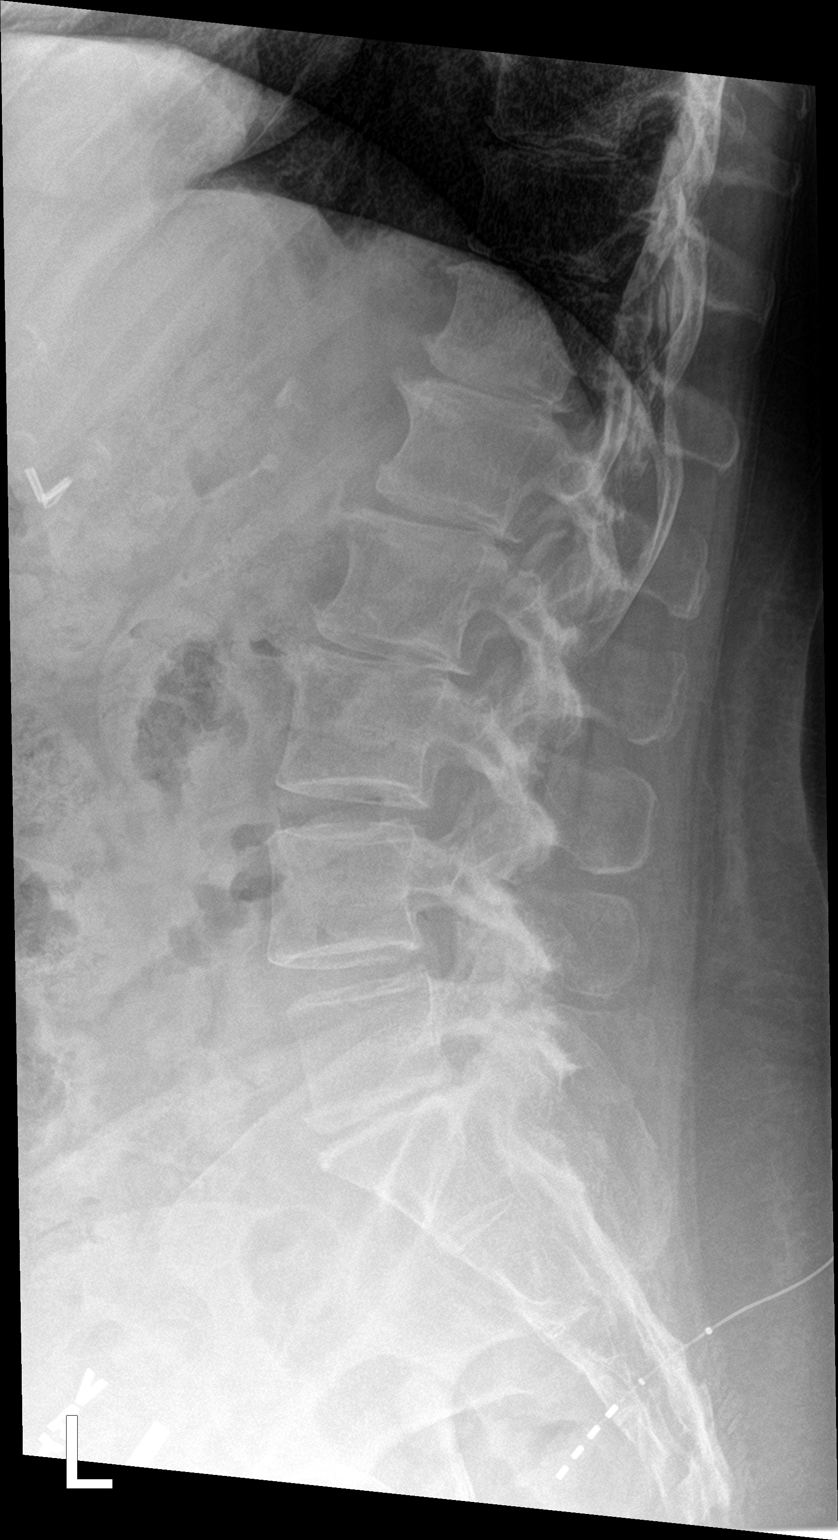

[l-spine spot]
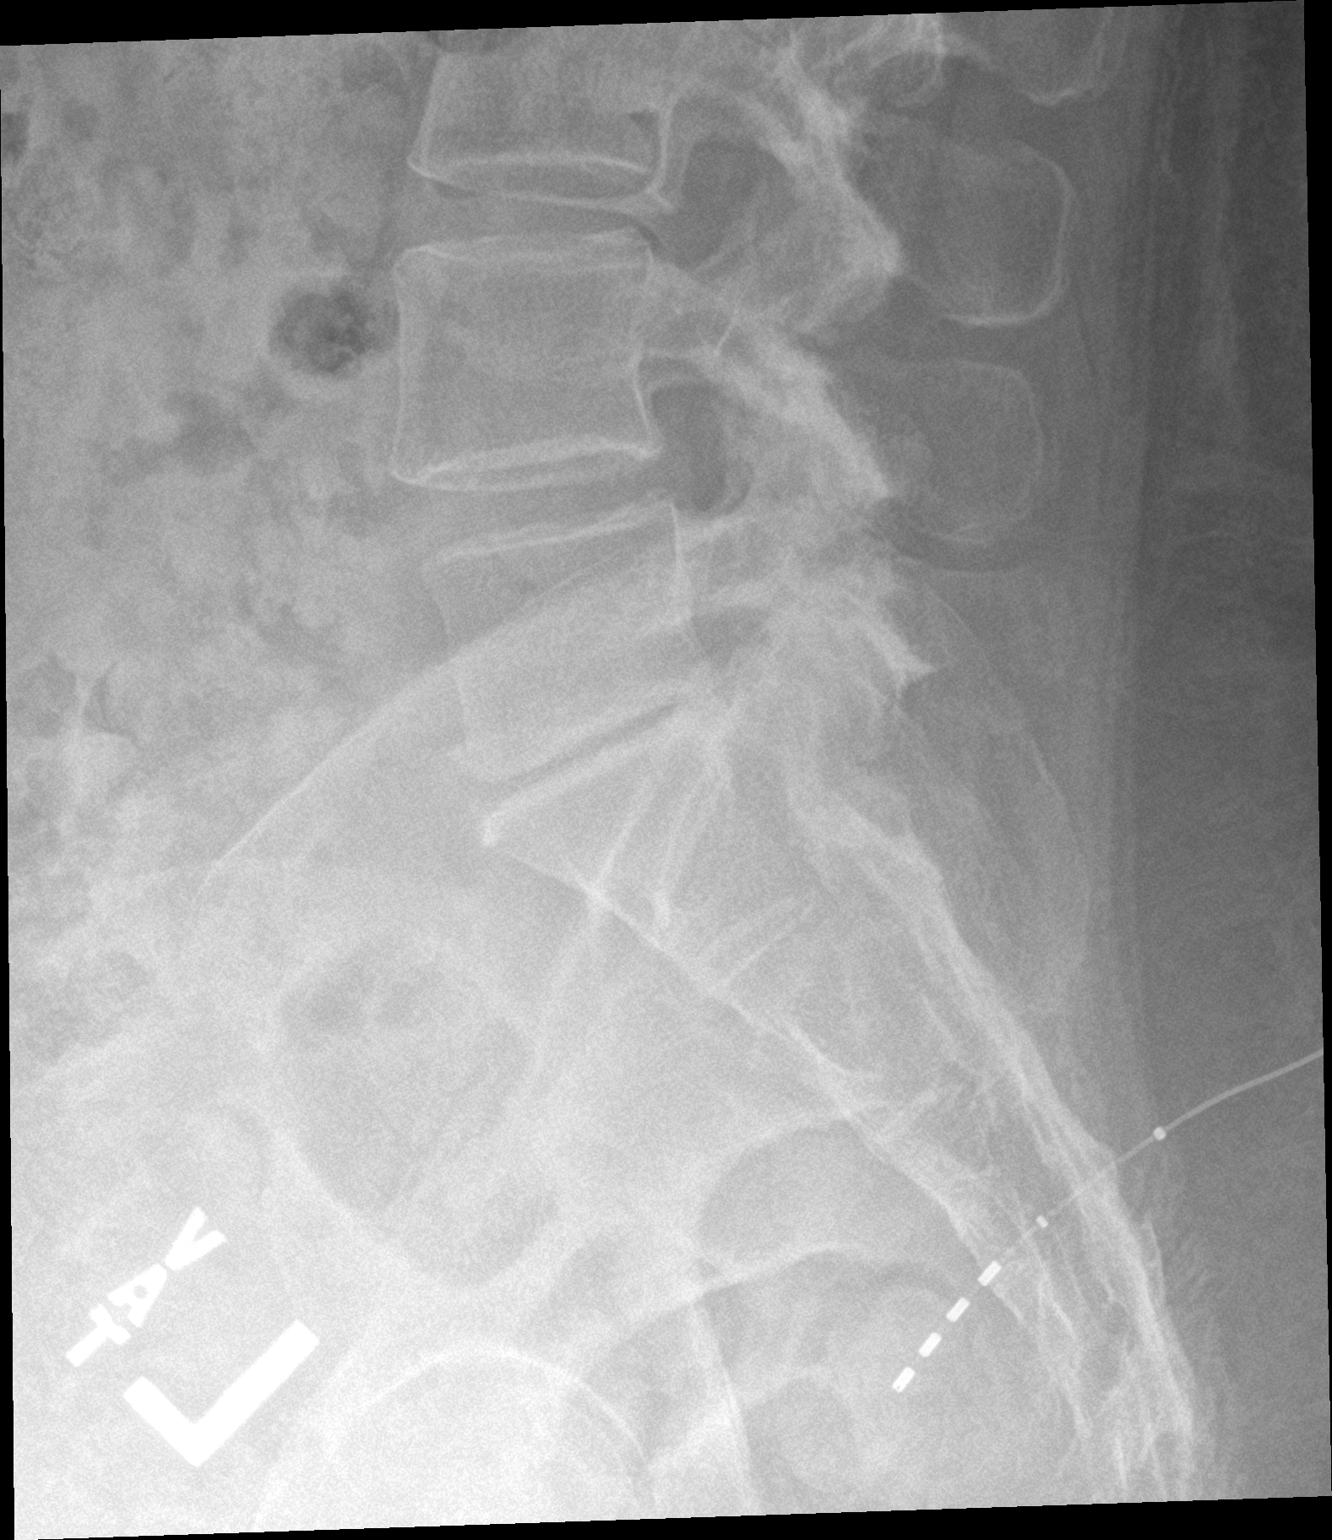

[3 of 3 positions shown; findings below may reference images not displayed]

FINDINGS: No fracture. Slight anterolisthesis of L4 on L5. No other
spondylolisthesis.

Mild loss of disc height at T12-L1 with mild to moderate loss of
disc height at L1-L2, L2-L3 and moderate loss of disc height at
L5-S1. Mild loss of disc height at L4-L5. Small endplate osteophytes
are noted most evident in the lower thoracic spine and upper lumbar
spine.

Stimulator device lead projects anterior to the right sacrum.
IMPRESSION: 1. No fracture or acute finding.
2. Degenerative changes as detailed, without change from prior exam.

## 2021-08-19 IMAGING — DX DG THORACIC SPINE 2V
3 series · 3 of 3 positions shown · non-contrast
Comparison: 12/01/2004

CLINICAL DATA: 73 y.o female. Chronic midline thoracic back
pain/low back pain/ Lumbosacral Spondylosis/ Lumbar Facet
Arthropathy

EXAM:
THORACIC SPINE 2 VIEWS

[t-spine ap]
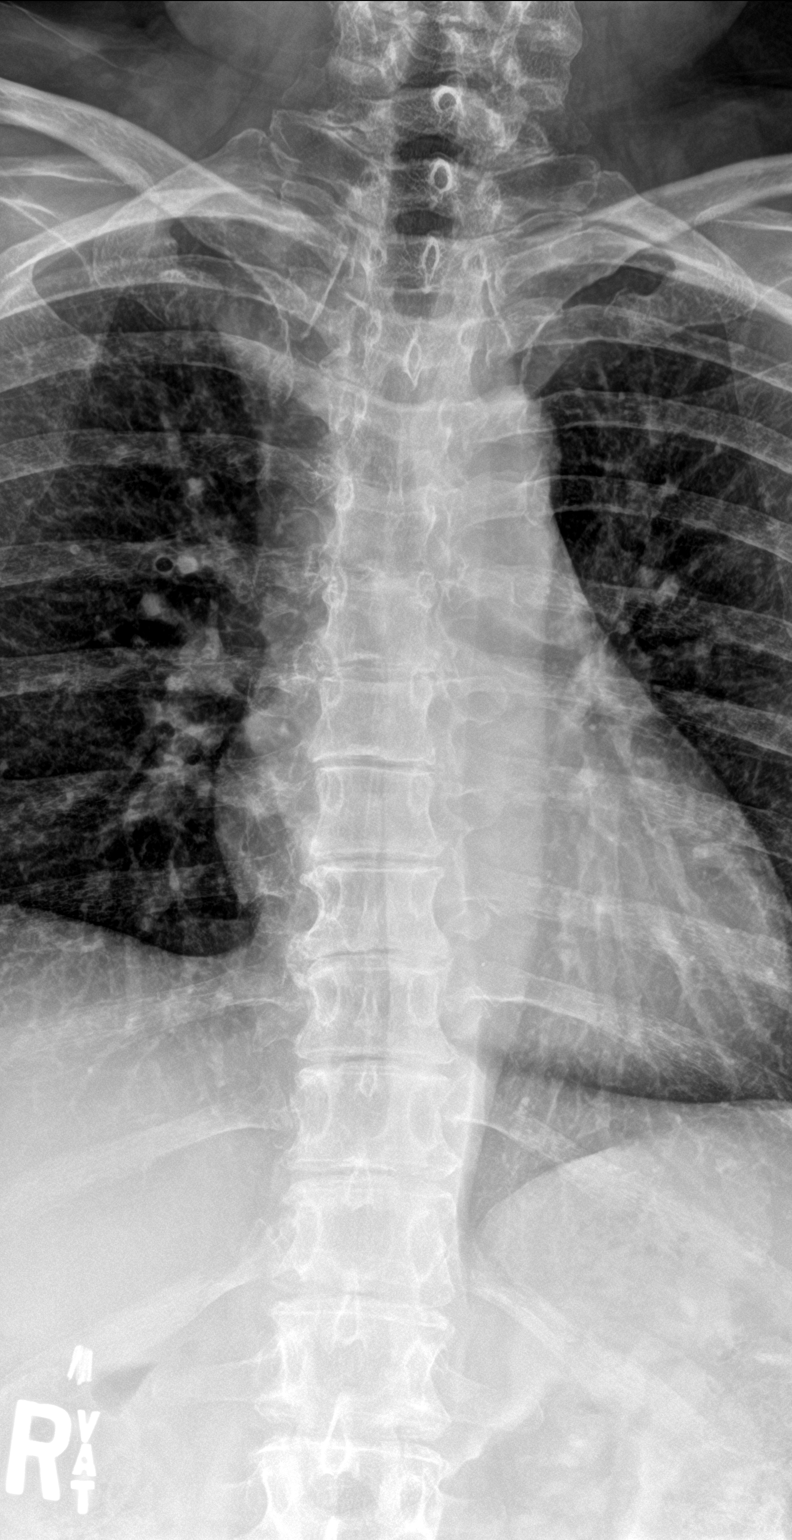

[t-spine lat]
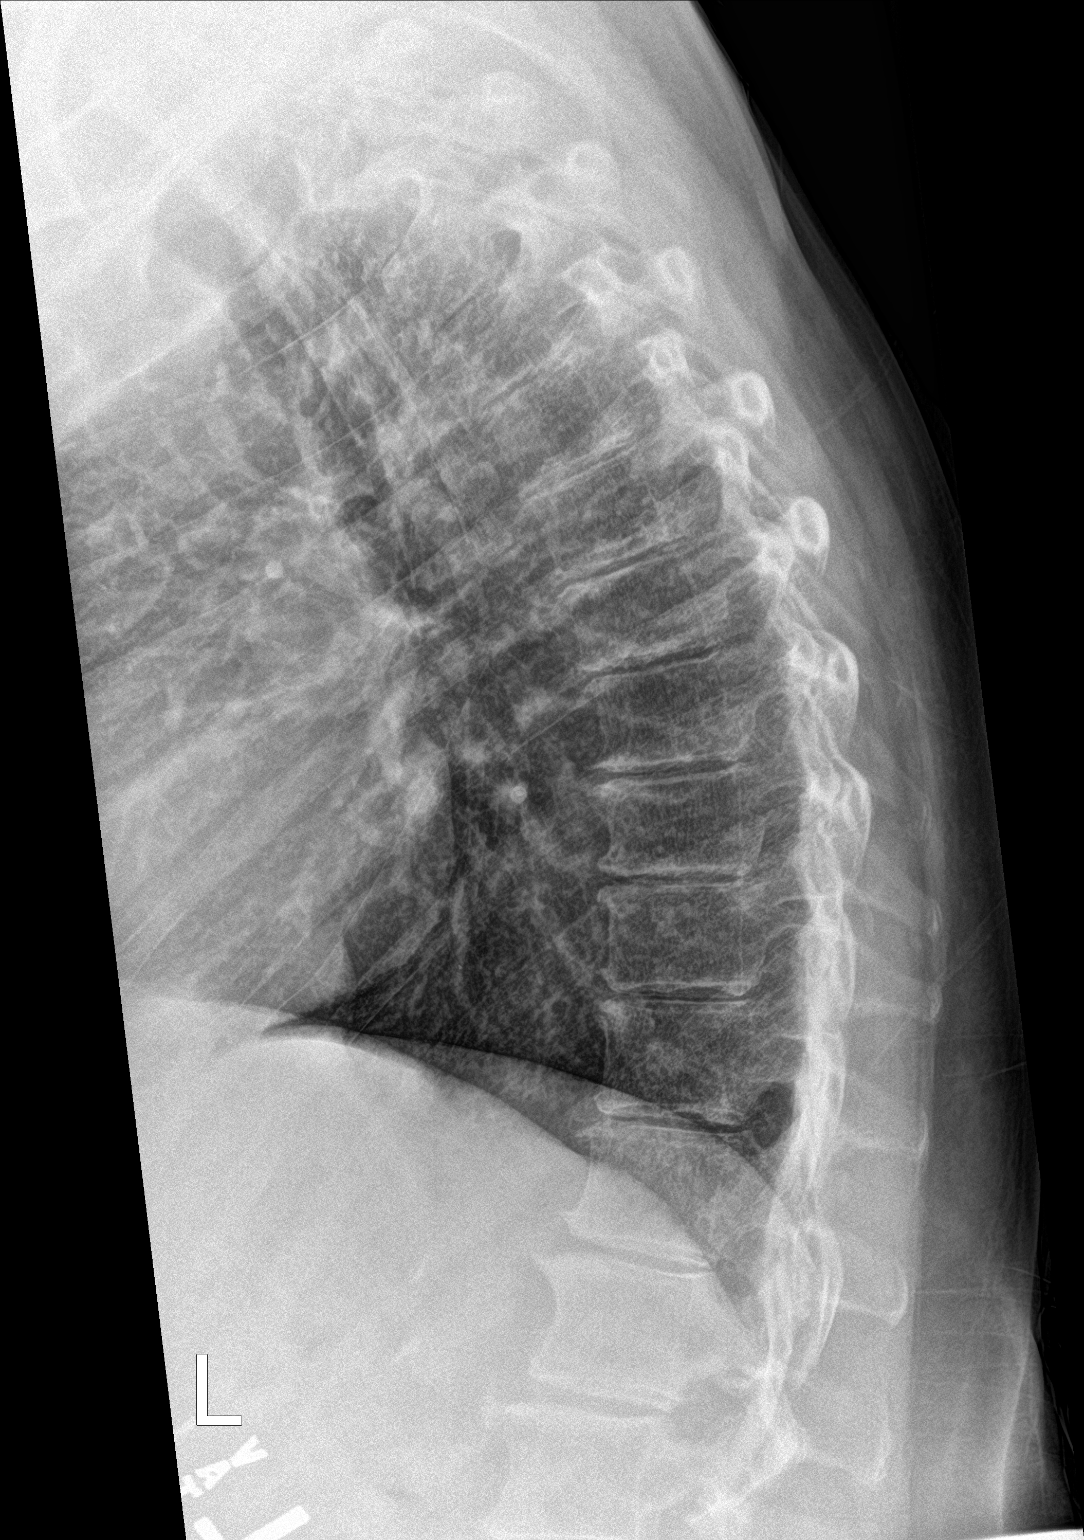

[t-spine swimmers]
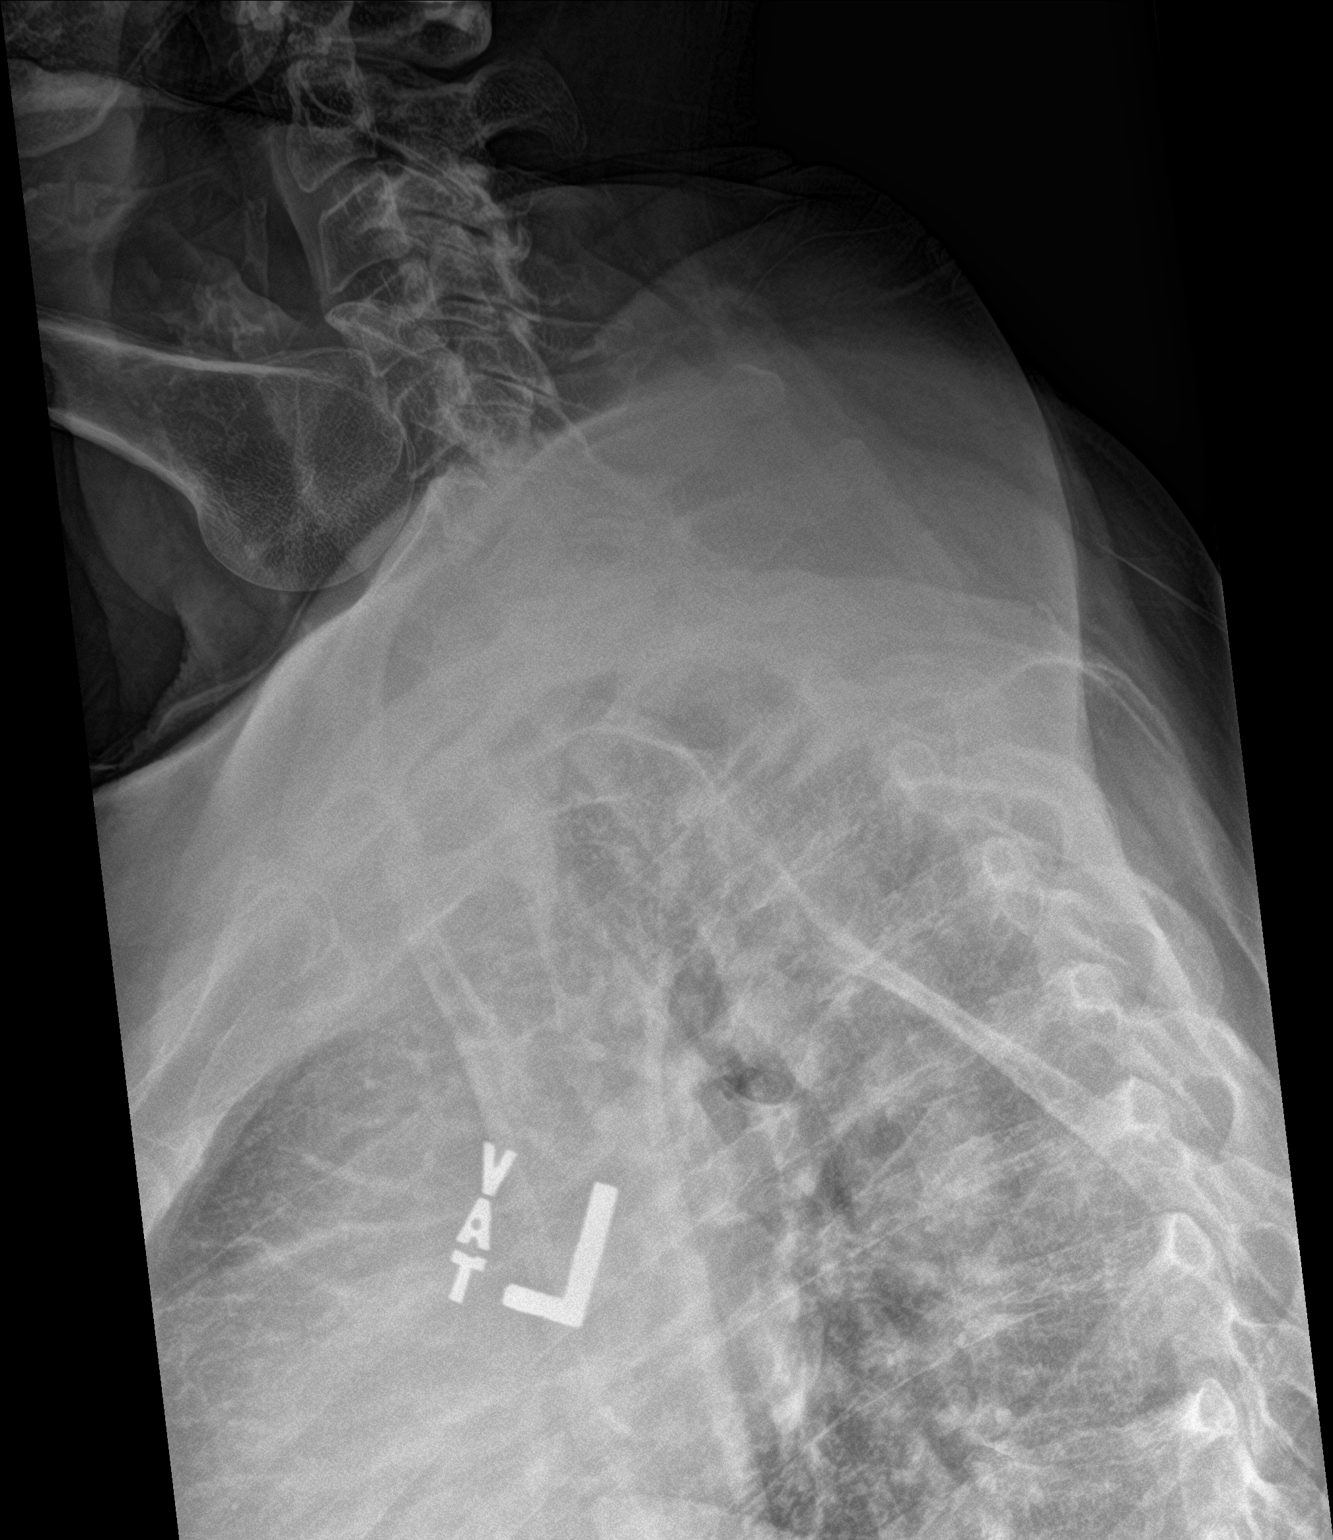

[3 of 3 positions shown; findings below may reference images not displayed]

FINDINGS: No fracture, bone lesion or spondylolisthesis.

Mild disc degenerative changes throughout the thoracic spine
reflected by mild loss of disc height and small endplate osteophytes
and mild endplate sclerosis.

Soft tissues are unremarkable.
IMPRESSION: 1. No fracture or acute finding.
2. Mild disc degenerative changes mildly progressed in the lower
lumbar spine compared to the prior exam.

## 2021-09-23 ENCOUNTER — Encounter: Payer: Self-pay | Admitting: Neurology

## 2021-09-23 ENCOUNTER — Ambulatory Visit: Payer: Medicare HMO | Admitting: Neurology

## 2021-09-23 VITALS — BP 159/81 | HR 71 | Ht 66.0 in | Wt 207.0 lb

## 2021-09-23 DIAGNOSIS — R269 Unspecified abnormalities of gait and mobility: Secondary | ICD-10-CM | POA: Diagnosis not present

## 2021-09-23 DIAGNOSIS — Z9181 History of falling: Secondary | ICD-10-CM

## 2021-09-23 DIAGNOSIS — M542 Cervicalgia: Secondary | ICD-10-CM | POA: Diagnosis not present

## 2021-09-23 DIAGNOSIS — M4802 Spinal stenosis, cervical region: Secondary | ICD-10-CM

## 2021-09-23 DIAGNOSIS — R251 Tremor, unspecified: Secondary | ICD-10-CM | POA: Diagnosis not present

## 2021-09-23 DIAGNOSIS — G4489 Other headache syndrome: Secondary | ICD-10-CM | POA: Diagnosis not present

## 2021-09-23 MED ORDER — METOPROLOL SUCCINATE ER 50 MG PO TB24: 50.0000 mg | ORAL_TABLET | Freq: Every day | ORAL | 11 refills | Status: DC

## 2021-09-23 NOTE — Progress Notes (Addendum)
? ?[facility] ? ?PATIENT: [name] [name] ?DOB: 1946/02/10 ? ?REFERRING DOCTOR OR PCP: [name] Aly, FNP ?SOURCE: Patient, notes from primary care, notes in epic, information about InterStim device, MRI of the cervical spine from 2013 ? ?_________________________________ ? ? ?HISTORICAL ? ?CHIEF COMPLAINT:  ?Chief Complaint  ?Patient presents with  ? Follow-up  ?  Pt with husband- rm 1. Here to follow up from [place] she had 05/18/2021.  ? ? ?HISTORY OF PRESENT ILLNESS:  ?[name] [name] is a 76 y.o. woman with tremor/shakes and worsening gait. ? ?Update 03/25/2021 ?Since the last visit, she has had a DATScan.    It showed "Normal Ioflupane scan. No reduced radiotracer activity in basal ganglia to suggest Parkinson's syndrome pathology."    ? ?She feel she is doing about the same as she had at the last visit.     ? ?She notes more tremor in the mouth area and head has tremor.     She feels the tremor is better with primidone 50 mg po tid and Sinemet 25/100 tid and metoprolol 25 mg po bid.      She also takes clonazepam for anxiety as needed and she notes the tremor is better when she takes it.     She tolerates these medications well.   She is better than last year.   ? ?Tremor is worse with intention and emotion (being upset).  She feels the 25 mg daily metoprolol has helped some (never went up to ycodone 5 mg bid for her back pain with benefit ? ?Tremor/gait history from early 10/22/2019 ?She noted tremors in her right > left hand and more difficulty with gait around age 53.    She notes fine tasks like painting her nails, keyboarding holding a utensil bring the tremor out more.    She notes shaking in her head and neck and sometimes in her body when she wakes up.    She notes upper body jerks when she is drowsy.   She also notes facial movements when she is tired.   ? ?She notes red ?+uced gait.   She feels balance is more of a problem than strength.  She hits the walls at times as she walks and  notes falling more to the right.    She ha no recent falls.   Steps are normal sized.   She feels her turning is more off balanced..    A couple years ago, she reports a fall in [place] hurting her left side.   She fell without any reason -- no slip or trip.      ? ?Data: ?MRI of the cervical spine from 08/23/2011 showed:Marland Kitchen  There are degenerative changes, worse at C6-C7 but no spinal cord changes or significant spinal stenosis ? ?CT cervical spine 7/15/02021 showed "Cervical disc and facet degeneration with moderate spinal ?stenosis at C5-6 and mild spinal stenosis at C3-4 and C6-7." and multilevel foraminal narrowing (moderate to the right at C3C4, C4C5, moderate to left at C6C7.     CT head 01/23/2020 showed mild chronic microvascular ischemic changes.   ? ?Nuclear medicine DaTscan 05/18/2021 was normal ? ? ?REVIEW OF SYSTEMS: ?Constitutional: She reports fevers, chills, weight gain, fatigue, insomnia ?Eyes: She reports eye pain. ?Ear, nose and throat: She reports hearing loss with tinnitus on the left. ?Cardiovascular: No chest pain, palpitations ?Respiratory: She reports shortness of breath, cough and snoring GastrointestinaI: No nausea, vomiting, diarrhea, abdominal pain, fecal incontinence ?Genitourinary: She has incontinence and has  an InterStim device.  Musculoskeletal: She reports neck pain, back pain, myalgias and joint pain. ?Integumentary: She reports pruritus and mild.   ?Neurological: as above ?Psychiatric: She sees psychiatry for bipolar disorder. ?Endocrine: She reports feeling hot or cold at times and increased thirst. ?Hematologic/Lymphatic:  No anemia, purpura, petechiae. ?Allergic/Immunologic: No itchy/runny eyes, nasal congestion, recent allergic reactions, rashes ? ?ALLERGIES: ?Allergies  ?Allergen Reactions  ? Diclofenac Sodium Anaphylaxis and Swelling  ?  "almost died" caused neck to swell.   ? Diclofenac Sodium Anaphylaxis and Swelling  ?  "almost died" caused neck to swell.   ? Nsaids  Anaphylaxis  ? Penicillins Swelling and Rash  ?  Did it involve swelling of the face/tongue/throat, SOB, or low BP? Y ?Did it involve sudden or severe rash/hives, skin peeling, or any reaction on the inside of your mouth or nose? Y ?Did you need to seek medical attention at a hospital or doctor's office? n ?When did it last happen?   more than 10 years ago ?If all above answers are ?NO?, may proceed with cephalosporin use. ?  ? Relafen [Nabumetone] Anaphylaxis and Swelling  ? Other Other (See Comments) and Swelling  ?  Honey causes throat to swell. Strawberries cause a rash.  ?"turnes red, swells, and pulls skin off" ?"turnes red, swells, and pulls skin off"  ? Adhesive [Tape] Swelling  ?  "turnes red, swells, and pulls skin off"  ? Codeine Nausea And Vomiting  ?  severe  ? Ex-Lax [Senna] Other (See Comments)  ?  Pt states "my fingers turned black"  ? Trazodone And Nefazodone Other (See Comments)  ?  Possible akathisia  ? Neosporin [Neomycin-Bacitracin Zn-Polymyx] Rash  ? ? ?HOME MEDICATIONS: ? ?Current Outpatient Medications:  ?  albuterol (PROVENTIL HFA;VENTOLIN HFA) 108 (90 Base) MCG/ACT inhaler, Inhale 2 puffs into the lungs every 4 (four) hours as needed for wheezing or shortness of breath. , Disp: , Rfl:  ?  ARIPiprazole (ABILIFY) 15 MG tablet, Take 15 mg by mouth daily., Disp: , Rfl:  ?  aspirin 325 MG tablet, Take 325 mg by mouth daily. Takes 1/4 tablet daily, Disp: , Rfl:  ?  CALCIUM PO, Take 600 mg by mouth daily., Disp: , Rfl:  ?  carbidopa-levodopa (SINEMET IR) 25-100 MG tablet, TAKE ONE TABLET BY MOUTH THREE TIMES A DAY, Disp: 90 tablet, Rfl: 5 ?  clonazePAM (KLONOPIN) 0.5 MG tablet, Take 0.5 mg by mouth daily as needed for anxiety., Disp: , Rfl:  ?  famotidine (PEPCID) 20 MG tablet, Take 20 mg by mouth daily., Disp: , Rfl:  ?  levothyroxine (SYNTHROID, LEVOTHROID) 100 MCG tablet, TAKE 1 TABLET(100 MCG) BY MOUTH DAILY BEFORE BREAKFAST (Patient taking differently: Take 100 mcg by mouth daily before  breakfast.), Disp: 30 tablet, Rfl: 5 ?  losartan (COZAAR) 100 MG tablet, Take 100 mg by mouth daily. , Disp: , Rfl:  ?  metoprolol succinate (TOPROL XL) 50 MG 24 hr tablet, Take 1 tablet (50 mg total) by mouth daily. Take with or immediately following a meal., Disp: 30 tablet, Rfl: 11 ?  omeprazole (PRILOSEC) 40 MG capsule, Take 1 capsule (40 mg total) by mouth 2 (two) times daily., Disp: 180 capsule, Rfl: 0 ?  oxyCODONE-acetaminophen (PERCOCET/ROXICET) 5-325 MG tablet, Take 1 tablet by mouth in the morning and at bedtime., Disp: , Rfl:  ?  polyethylene glycol (MIRALAX / GLYCOLAX) packet, Take 17 g by mouth daily as needed for mild constipation., Disp: , Rfl:  ?  pravastatin (PRAVACHOL)  40 MG tablet, Take 40 mg by mouth at bedtime., Disp: , Rfl:  ?  pregabalin (LYRICA) 150 MG capsule, Take 150 mg by mouth in the morning, at noon, and at bedtime., Disp: , Rfl:  ?  primidone (MYSOLINE) 50 MG tablet, Take 1 tablet (50 mg total) by mouth 3 (three) times daily., Disp: 90 tablet, Rfl: 5 ?  QUEtiapine Fumarate (SEROQUEL XR) 150 MG 24 hr tablet, Take 150 mg by mouth at bedtime., Disp: , Rfl:  ?  traZODone (DESYREL) 100 MG tablet, Take 100 mg by mouth at bedtime., Disp: , Rfl:  ?  venlafaxine (EFFEXOR) 100 MG tablet, TAKE ONE TABLET BY MOUTH DAILY THREE TIMES A DAY, Disp: 90 tablet, Rfl: 0 ?  lamoTRIgine (LAMICTAL) 150 MG tablet, Take 1 tablet by mouth 2 (two) times daily. , Disp: , Rfl:  ? ?PAST MEDICAL HISTORY: ?Past Medical History:  ?Diagnosis Date  ? Anxiety   ? Cervical radiculopathy   ? Chronic back pain   ? R > L  ? Chronic neck pain   ? Depression   ? Dysphagia   ? functional --  takes small bites  ? Fibromyalgia   ? GERD (gastroesophageal reflux disease)   ? Hemorrhoids   ? History of ankle fracture 01/08/2012  ? Status post open reduction internal fixation of a left ankle trimalleolar fracture by Dr. Iona Hansen on 01/10/2012.    ? History of benign thyroid tumor   ? History of diabetes mellitus, type II   ?  was diet controlled -- per pt pcp she was no longer diabetic  ? History of hiatal hernia   ? History of TIA (transient ischemic attack)   ? 02-27-2009  no residual  ? HTN (hypertension)   ? Hyperlipidemia   ? Hypoth

## 2021-09-23 NOTE — Patient Instructions (Signed)
Stop the Sinemet ?  Increase the Metoprolol (new prescription sent in) ?

## 2022-03-30 ENCOUNTER — Ambulatory Visit: Payer: Medicare HMO | Admitting: Neurology

## 2022-03-30 ENCOUNTER — Encounter: Payer: Self-pay | Admitting: Neurology

## 2022-03-30 VITALS — BP 115/86 | HR 84 | Ht 65.0 in | Wt 213.8 lb

## 2022-03-30 DIAGNOSIS — R269 Unspecified abnormalities of gait and mobility: Secondary | ICD-10-CM | POA: Diagnosis not present

## 2022-03-30 DIAGNOSIS — R251 Tremor, unspecified: Secondary | ICD-10-CM | POA: Diagnosis not present

## 2022-03-30 DIAGNOSIS — G4489 Other headache syndrome: Secondary | ICD-10-CM

## 2022-03-30 DIAGNOSIS — Z9181 History of falling: Secondary | ICD-10-CM

## 2022-03-30 DIAGNOSIS — M4802 Spinal stenosis, cervical region: Secondary | ICD-10-CM | POA: Diagnosis not present

## 2022-03-30 DIAGNOSIS — M542 Cervicalgia: Secondary | ICD-10-CM

## 2022-03-30 MED ORDER — CLONAZEPAM 0.5 MG PO TABS: 0.5000 mg | ORAL_TABLET | Freq: Two times a day (BID) | ORAL | 2 refills | Status: DC

## 2022-03-30 NOTE — Progress Notes (Signed)
[facility]  PATIENT: [name] [name] DOB: 1945-08-26  REFERRING DOCTOR OR PCP: Vicenta Aly, FNP SOURCE: Patient, notes from primary care, notes in epic, information about InterStim device, MRI of the cervical spine from 2013  _________________________________   HISTORICAL  CHIEF COMPLAINT:  Chief Complaint  Patient presents with   Follow-up    Pt in room #2 with husband. Pt here today for f/u tremors.    HISTORY OF PRESENT ILLNESS:  [name] [name] is a 76 y.o. woman with tremor/shakes and worsening gait.  Update 03/25/2021 She has had a DATScan.    It showed "Normal Ioflupane scan. No reduced radiotracer activity in basal ganglia to suggest Parkinson's syndrome pathology."     I stopped her Sinemet and started metoprolol 50 mg daily.   She feels her tremor is doing better  She denies any s.e. No lightheadedness.     Besides the hands, she has tremor in the lips  ,head.  The metoprolol has especially helped the face   Besides metoprolol she is on clonazepam bid.   She had been on primidone but sopped - unclear it was helping,         She also takes clonazepam for anxiety as needed and she notes the tremor is better when she takes it.    Tremor is worse with intention and emotion (being upset).     Tremor/gait history from early 10/22/2019 She noted tremors in her right > left hand and more difficulty with gait around age 52.    She notes fine tasks like painting her nails, keyboarding holding a utensil bring the tremor out more.    She notes shaking in her head and neck and sometimes in her body when she wakes up.    She notes upper body jerks when she is drowsy.   She also notes facial movements when she is tired.    She notes red +uced gait.   She feels balance is more of a problem than strength.  She hits the walls at times as she walks and notes falling more to the right.    She ha no recent falls.   Steps are normal sized.   She feels her turning is more  off balanced..    A couple years ago, she reports a fall in [place] hurting her left side.   She fell without any reason -- no slip or trip.       Data: MRI of the cervical spine from 08/23/2011 showed:Marland Kitchen  There are degenerative changes, worse at C6-C7 but no spinal cord changes or significant spinal stenosis  CT cervical spine 7/15/02021 showed "Cervical disc and facet degeneration with moderate spinal stenosis at C5-6 and mild spinal stenosis at C3-4 and C6-7." and multilevel foraminal narrowing (moderate to the right at C3C4, C4C5, moderate to left at C6C7.     CT head 01/23/2020 showed mild chronic microvascular ischemic changes.    Nuclear medicine DaTscan 05/18/2021 was normal   REVIEW OF SYSTEMS: Constitutional: She reports fevers, chills, weight gain, fatigue, insomnia Eyes: She reports eye pain. Ear, nose and throat: She reports hearing loss with tinnitus on the left. Cardiovascular: No chest pain, palpitations Respiratory: She reports shortness of breath, cough and snoring GastrointestinaI: No nausea, vomiting, diarrhea, abdominal pain, fecal incontinence Genitourinary: She has incontinence and has an InterStim device.  Musculoskeletal: She reports neck pain, back pain, myalgias and joint pain. Integumentary: She reports pruritus and mild.   Neurological: as above Psychiatric: She  sees psychiatry for bipolar disorder. Endocrine: She reports feeling hot or cold at times and increased thirst. Hematologic/Lymphatic:  No anemia, purpura, petechiae. Allergic/Immunologic: No itchy/runny eyes, nasal congestion, recent allergic reactions, rashes  ALLERGIES: Allergies  Allergen Reactions   Diclofenac Sodium Anaphylaxis and Swelling    "almost died" caused neck to swell.    Diclofenac Sodium Anaphylaxis and Swelling    "almost died" caused neck to swell.    Nsaids Anaphylaxis   Penicillins Swelling and Rash    Did it involve swelling of the face/tongue/throat, SOB, or low BP? Y Did it  involve sudden or severe rash/hives, skin peeling, or any reaction on the inside of your mouth or nose? Y Did you need to seek medical attention at a hospital or doctor's office? n When did it last happen?   more than 10 years ago If all above answers are "NO", may proceed with cephalosporin use.    Relafen [Nabumetone] Anaphylaxis and Swelling   Other Other (See Comments) and Swelling    Honey causes throat to swell. Strawberries cause a rash.  "turnes red, swells, and pulls skin off" "turnes red, swells, and pulls skin off"   Adhesive [Tape] Swelling    "turnes red, swells, and pulls skin off"   Codeine Nausea And Vomiting    severe   Ex-Lax [Senna] Other (See Comments)    Pt states "my fingers turned black"   Trazodone And Nefazodone Other (See Comments)    Possible akathisia   Neosporin [Neomycin-Bacitracin Zn-Polymyx] Rash    HOME MEDICATIONS:  Current Outpatient Medications:    albuterol (PROVENTIL HFA;VENTOLIN HFA) 108 (90 Base) MCG/ACT inhaler, Inhale 2 puffs into the lungs every 4 (four) hours as needed for wheezing or shortness of breath. , Disp: , Rfl:    ARIPiprazole (ABILIFY) 15 MG tablet, Take 15 mg by mouth daily., Disp: , Rfl:    aspirin 325 MG tablet, Take 325 mg by mouth daily. Takes 1/4 tablet daily, Disp: , Rfl:    CALCIUM PO, Take 600 mg by mouth daily., Disp: , Rfl:    carbidopa-levodopa (SINEMET IR) 25-100 MG tablet, TAKE ONE TABLET BY MOUTH THREE TIMES A DAY, Disp: 90 tablet, Rfl: 5   famotidine (PEPCID) 20 MG tablet, Take 20 mg by mouth daily., Disp: , Rfl:    levothyroxine (SYNTHROID, LEVOTHROID) 100 MCG tablet, TAKE 1 TABLET(100 MCG) BY MOUTH DAILY BEFORE BREAKFAST (Patient taking differently: Take 100 mcg by mouth daily before breakfast.), Disp: 30 tablet, Rfl: 5   losartan (COZAAR) 100 MG tablet, Take 100 mg by mouth daily. , Disp: , Rfl:    metoprolol succinate (TOPROL XL) 50 MG 24 hr tablet, Take 1 tablet (50 mg total) by mouth daily. Take with or  immediately following a meal., Disp: 30 tablet, Rfl: 11   omeprazole (PRILOSEC) 40 MG capsule, Take 1 capsule (40 mg total) by mouth 2 (two) times daily., Disp: 180 capsule, Rfl: 0   oxyCODONE-acetaminophen (PERCOCET/ROXICET) 5-325 MG tablet, Take 1 tablet by mouth in the morning and at bedtime., Disp: , Rfl:    polyethylene glycol (MIRALAX / GLYCOLAX) packet, Take 17 g by mouth daily as needed for mild constipation., Disp: , Rfl:    pravastatin (PRAVACHOL) 40 MG tablet, Take 40 mg by mouth at bedtime., Disp: , Rfl:    pregabalin (LYRICA) 150 MG capsule, Take 150 mg by mouth in the morning, at noon, and at bedtime., Disp: , Rfl:    primidone (MYSOLINE) 50 MG tablet, Take 1 tablet (50  mg total) by mouth 3 (three) times daily., Disp: 90 tablet, Rfl: 5   QUEtiapine Fumarate (SEROQUEL XR) 150 MG 24 hr tablet, Take 150 mg by mouth at bedtime., Disp: , Rfl:    traZODone (DESYREL) 100 MG tablet, Take 100 mg by mouth at bedtime., Disp: , Rfl:    venlafaxine (EFFEXOR) 100 MG tablet, TAKE ONE TABLET BY MOUTH DAILY THREE TIMES A DAY, Disp: 90 tablet, Rfl: 0   clonazePAM (KLONOPIN) 0.5 MG tablet, Take 1 tablet (0.5 mg total) by mouth 2 (two) times daily., Disp: 60 tablet, Rfl: 2   lamoTRIgine (LAMICTAL) 150 MG tablet, Take 1 tablet by mouth 2 (two) times daily. , Disp: , Rfl:   PAST MEDICAL HISTORY: Past Medical History:  Diagnosis Date   Anxiety    Cervical radiculopathy    Chronic back pain    R > L   Chronic neck pain    Depression    Dysphagia    functional --  takes small bites   Fibromyalgia    GERD (gastroesophageal reflux disease)    Hemorrhoids    History of ankle fracture 01/08/2012   Status post open reduction internal fixation of a left ankle trimalleolar fracture by Dr. Iona Hansen on 01/10/2012.     History of benign thyroid tumor    History of diabetes mellitus, type II    was diet controlled -- per pt pcp she was no longer diabetic   History of hiatal hernia    History of TIA  (transient ischemic attack)    02-27-2009  no residual   HTN (hypertension)    Hyperlipidemia    Hypothyroidism, postsurgical    Lumbar radicular pain    R>L legs   Migraine headache    Mild intermittent asthma with allergic rhinitis without complication    PONV (postoperative nausea and vomiting)    Rotator cuff syndrome of right shoulder    Urge urinary incontinence    refractory   Wears glasses     PAST SURGICAL HISTORY: Past Surgical History:  Procedure Laterality Date   CARDIAC CATHETERIZATION  01-17-2003  dr Darnell Level brodie   normal coronary arteries and LVF,  ef 60%   EXCISION MORTON'S NEUROMA  2002   bilateral feet   EXCISION OF BREAST BIOPSY Right 1990's   benign   INTERSTIM IMPLANT PLACEMENT N/A 02/10/2015   Procedure: Barrie Lyme IMPLANT FIRST STAGE;  Surgeon: Bjorn Loser, MD;  [facility];  Service: Urology;  Laterality: N/A;   INTERSTIM IMPLANT PLACEMENT N/A 02/10/2015   Procedure: Barrie Lyme IMPLANT SECOND STAGE;  Surgeon: Bjorn Loser, MD;  Location: [facility];  Service: Urology;  Laterality: N/A;   LAPAROSCOPIC CHOLECYSTECTOMY  2000   NEGATIVE SLEEP STUDY  2000   per pt   ORIF ANKLE FRACTURE  01/10/2012   Procedure: OPEN REDUCTION INTERNAL FIXATION (ORIF) ANKLE FRACTURE;  Surgeon: Sanjuana Kava, MD;  Location: [facility];  Service: Orthopedics;  Laterality: Left;   TOTAL THYROIDECTOMY  1985   benign tumor   TRANSTHORACIC ECHOCARDIOGRAM  03-02-2009   normal echo,  ef 55-60%   VAGINAL HYSTERECTOMY  1984   WRIST SURGERY Left 2000    FAMILY HISTORY: Family History  Problem Relation Age of Onset   Cancer Other        breast -- grandmother   Cancer Other        lung -- uncles (39)   Leukemia Sister    Diabetes Father    Coronary artery disease  Father    Diabetes Mother    Dementia Mother     SOCIAL HISTORY:  Social History   Socioeconomic History   Marital status: Married    Spouse name: John Vasconcelos   Number of  children: 2   Years of education: 12   Highest education level: Not on file  Occupational History   Occupation: Retired  Tobacco Use   Smoking status: Never   Smokeless tobacco: Never  Substance and Sexual Activity   Alcohol use: No   Drug use: No   Sexual activity: Not on file  Other Topics Concern   Not on file  Social History Narrative   Lives with Jeryn Bertoni (husband) and Interior and spatial designer (grandson)   Caffeine use: 7 cups per day   Social Determinants of Health   Financial Resource Strain: Not on file  Food Insecurity: Not on file  Transportation Needs: Not on file  Physical Activity: Not on file  Stress: Not on file  Social Connections: Not on file  Intimate Partner Violence: Not on file     PHYSICAL EXAM  Vitals:   03/30/22 1114  BP: 115/86  Pulse: 84  Weight: 213 lb 12.8 oz (97 kg)  Height: '5\' 5"'$  (1.651 m)    Body mass index is 35.58 kg/m.   General: The patient is well-developed and well-nourished and in no acute distress  HEENT:  Head is [place]/AT.  Sclera are anicteric.   She is tender over the right occiput/splenius capitis muscle  Skin: Extremities are without rash or edema.   Neurologic Exam  Mental status: The patient is alert and oriented x 3 at the time of the examination. The patient has apparent normal recent and remote memory, with an apparently normal attention span and concentration ability.   Speech is normal.  Cranial nerves: Extraocular movements are full   Facial symmetry is present.  Facial strength is normal.  No dysarthria is noted.   . No obvious hearing deficits are noted.  Motor: There is a 5 to 6 Hz tremor in the hands and facethat worsens with intention.   Muscle bulk is normal.   Tone is normal. Strength is  5 / 5 in all 4 extremities.   Sensory: Sensory testing is intact to touch and vib  Coordination: Cerebellar testing reveals good finger-nose-finger and heel-to-shin bilaterally.  Gait and station: Station is normal.   Gait  is just slightly wide. Tandem gait is moderately wide .she is able to turn 180 degrees and 3 steps. Mild retropulsion with posture disturbed.  Romberg is negative.  Reflexes: Deep tendon reflexes are 3 at knees .  No ankle clonus .      DIAGNOSTIC DATA (LABS, IMAGING, TESTING) - I reviewed patient records, labs, notes, testing and imaging myself where available.  Lab Results  Component Value Date   WBC 5.3 08/24/2020   HGB 11.7 (L) 08/24/2020   HCT 37.2 08/24/2020   MCV 95.1 08/24/2020   PLT 250 08/24/2020      Component Value Date/Time   NA 141 08/24/2020 1758   NA 141 06/15/2015 0954   K 4.1 08/24/2020 1758   CL 104 08/24/2020 1758   CO2 25 08/24/2020 1758   GLUCOSE 95 08/24/2020 1758   BUN 9 08/24/2020 1758   BUN 15 06/15/2015 0954   CREATININE 0.75 08/24/2020 1758   CREATININE 0.67 09/18/2014 1145   CALCIUM 9.2 08/24/2020 1758   PROT 7.7 07/25/2018 1417   ALBUMIN 4.9 07/25/2018 1417   AST  20 07/25/2018 1417   ALT 21 07/25/2018 1417   ALKPHOS 98 07/25/2018 1417   BILITOT 0.7 07/25/2018 1417   GFRNONAA >60 08/24/2020 1758   GFRNONAA >89 09/18/2014 1145   GFRAA >60 07/25/2018 1417   GFRAA >89 09/18/2014 1145   Lab Results  Component Value Date   CHOL 170 09/18/2014   HDL 63 09/18/2014   LDLCALC 81 09/18/2014   TRIG 128 09/18/2014   CHOLHDL 2.7 09/18/2014   Lab Results  Component Value Date   HGBA1C 5.8 02/27/2015   Lab Results  Component Value Date   EXNTZGYF74 944 12/30/2009   Lab Results  Component Value Date   TSH 2.680 06/15/2015       ASSESSMENT AND PLAN  Tremor  Gait disturbance  Risk for falls  Cervical spinal stenosis  Neck pain  Other headache syndrome  1.   Her movement disorder is difficult to classify.  However, the DATScan was negative.    She has responded better to BET med's than PD med's.   2.    Continue  metoprolol to XL 50 mg   Increase clonazepam to 0.5 mg po bid (regular rather than prn) 3.   She has moderate  spinal stenosis but does not have long track signs..   4.    We discussed I would be happy to refer to a Movement Disorder specialist as this is not my subspecialty   For now she would like to stray with [place].   5.    Bilateral splenius capitus injection with 80 mg Depo-Medrol in 3 cc Marcaine using sterile technique.   She tolerated the injection well and pain was much better afterwards.    6.    rtc 6 month   Rafe Mackowski A. Felecia Shelling, MD, [facility] 9/67/5916, 3:84 PM Certified in Neurology, Clinical Neurophysiology, Sleep Medicine and Neuroimaging  [facility] [address] [place], [place] [address] [phone]

## 2022-09-28 ENCOUNTER — Ambulatory Visit (INDEPENDENT_AMBULATORY_CARE_PROVIDER_SITE_OTHER): Payer: Medicare HMO | Admitting: Neurology

## 2022-09-28 ENCOUNTER — Encounter: Payer: Self-pay | Admitting: Neurology

## 2022-09-28 VITALS — BP 139/85 | HR 75 | Ht 63.0 in | Wt 190.5 lb

## 2022-09-28 DIAGNOSIS — M4802 Spinal stenosis, cervical region: Secondary | ICD-10-CM

## 2022-09-28 DIAGNOSIS — Z9181 History of falling: Secondary | ICD-10-CM | POA: Diagnosis not present

## 2022-09-28 DIAGNOSIS — R251 Tremor, unspecified: Secondary | ICD-10-CM

## 2022-09-28 DIAGNOSIS — G250 Essential tremor: Secondary | ICD-10-CM

## 2022-09-28 DIAGNOSIS — R269 Unspecified abnormalities of gait and mobility: Secondary | ICD-10-CM | POA: Diagnosis not present

## 2022-09-28 DIAGNOSIS — G4489 Other headache syndrome: Secondary | ICD-10-CM

## 2022-09-28 DIAGNOSIS — M542 Cervicalgia: Secondary | ICD-10-CM

## 2022-09-28 MED ORDER — METOPROLOL SUCCINATE ER 50 MG PO TB24: 50.0000 mg | ORAL_TABLET | Freq: Every day | ORAL | 4 refills | Status: DC

## 2022-09-28 MED ORDER — CLONAZEPAM 0.5 MG PO TABS: 0.5000 mg | ORAL_TABLET | Freq: Two times a day (BID) | ORAL | 5 refills | Status: DC

## 2022-09-28 NOTE — Progress Notes (Signed)
[facility]  PATIENT: [name] [name] DOB: 25-May-1946  REFERRING DOCTOR OR PCP: Vicenta Aly, FNP SOURCE: Patient, notes from primary care, notes in epic, information about InterStim device, MRI of the cervical spine from 2013  _________________________________   HISTORICAL  CHIEF COMPLAINT:  Chief Complaint  Patient presents with   Room 10    Pt is here with her Husband. Pt states that things have been going pretty good since last appointment. Pt's husband states that pt had a tremor event last night when she was standing up talking to her Daughters. Pt's husband states that pt's balance has been okay. Pt states no new falls recently. Pt's husband states that pt's swallowing has improved.     HISTORY OF PRESENT ILLNESS:  [name] [name] is a 77 y.o. woman with tremor/shakes and worsening gait.  Update 09/28/2022 She has had a DATScan.    It showed "Normal Ioflupane scan. No reduced radiotracer activity in basal ganglia to suggest Parkinson's syndrome pathology."     After the DATscan, we started metoprolol 50 mg daily.  This helped some and the addition of clonazepma helped further.     She had been on primidone but sopped - unclear it was helping,       The tremor is hands, lips  ,head.   Gait is mildly reduced.   No falls.   Mild difficulty rising from a chair.   Tremor is worse with intention and emotion (being upset).     Anxiety is also better on thee clonazepam  Her occipital HA improved with the splenius capitus trigger point injections  - much better x several months and still better now than last visit.     Tremor/gait history from early 10/22/2019 She noted tremors in her right > left hand and more difficulty with gait around age 77.    She notes fine tasks like painting her nails, keyboarding holding a utensil bring the tremor out more.    She notes shaking in her head and neck and sometimes in her body when she wakes up.    She notes upper body  jerks when she is drowsy.   She also notes facial movements when she is tired.    She notes red +uced gait.   She feels balance is more of a problem than strength.  She hits the walls at times as she walks and notes falling more to the right.    She ha no recent falls.   Steps are normal sized.   She feels her turning is more off balanced..    A couple years ago, she reports a fall in [place] hurting her left side.   She fell without any reason -- no slip or trip.       Data: MRI of the cervical spine from 08/23/2011 showed:Marland Kitchen  There are degenerative changes, worse at C6-C7 but no spinal cord changes or significant spinal stenosis  CT cervical spine 7/15/02021 showed "Cervical disc and facet degeneration with moderate spinal stenosis at C5-6 and mild spinal stenosis at C3-4 and C6-7." and multilevel foraminal narrowing (moderate to the right at C3C4, C4C5, moderate to left at C6C7.     CT head 01/23/2020 showed mild chronic microvascular ischemic changes.    Nuclear medicine DaTscan 05/18/2021 was normal   REVIEW OF SYSTEMS: Constitutional: She reports fevers, chills, weight gain, fatigue, insomnia Eyes: She reports eye pain. Ear, nose and throat: She reports hearing loss with tinnitus on the left. Cardiovascular: No  chest pain, palpitations Respiratory: She reports shortness of breath, cough and snoring GastrointestinaI: No nausea, vomiting, diarrhea, abdominal pain, fecal incontinence Genitourinary: She has incontinence and has an InterStim device.  Musculoskeletal: She reports neck pain, back pain, myalgias and joint pain. Integumentary: She reports pruritus and mild.   Neurological: as above Psychiatric: She sees psychiatry for bipolar disorder. Endocrine: She reports feeling hot or cold at times and increased thirst. Hematologic/Lymphatic:  No anemia, purpura, petechiae. Allergic/Immunologic: No itchy/runny eyes, nasal congestion, recent allergic reactions, rashes  ALLERGIES: Allergies   Allergen Reactions   Diclofenac Sodium Anaphylaxis and Swelling    "almost died" caused neck to swell.    Diclofenac Sodium Anaphylaxis and Swelling    "almost died" caused neck to swell.    Nsaids Anaphylaxis   Penicillins Swelling and Rash    Did it involve swelling of the face/tongue/throat, SOB, or low BP? Y Did it involve sudden or severe rash/hives, skin peeling, or any reaction on the inside of your mouth or nose? Y Did you need to seek medical attention at a hospital or doctor's office? n When did it last happen?   more than 10 years ago If all above answers are "NO", may proceed with cephalosporin use.    Relafen [Nabumetone] Anaphylaxis and Swelling   Other Other (See Comments) and Swelling    Honey causes throat to swell. Strawberries cause a rash.  "turnes red, swells, and pulls skin off" "turnes red, swells, and pulls skin off"   Adhesive [Tape] Swelling    "turnes red, swells, and pulls skin off"   Codeine Nausea And Vomiting    severe   Ex-Lax [Senna] Other (See Comments)    Pt states "my fingers turned black"   Trazodone And Nefazodone Other (See Comments)    Possible akathisia   Neosporin [Neomycin-Bacitracin Zn-Polymyx] Rash    HOME MEDICATIONS:  Current Outpatient Medications:    albuterol (PROVENTIL HFA;VENTOLIN HFA) 108 (90 Base) MCG/ACT inhaler, Inhale 2 puffs into the lungs every 4 (four) hours as needed for wheezing or shortness of breath. , Disp: , Rfl:    ARIPiprazole (ABILIFY) 15 MG tablet, Take 15 mg by mouth daily., Disp: , Rfl:    aspirin 325 MG tablet, Take 325 mg by mouth daily. Takes 1/4 tablet daily, Disp: , Rfl:    CALCIUM PO, Take 600 mg by mouth daily., Disp: , Rfl:    carbidopa-levodopa (SINEMET IR) 25-100 MG tablet, TAKE ONE TABLET BY MOUTH THREE TIMES A DAY, Disp: 90 tablet, Rfl: 5   famotidine (PEPCID) 20 MG tablet, Take 20 mg by mouth daily., Disp: , Rfl:    levothyroxine (SYNTHROID, LEVOTHROID) 100 MCG tablet, TAKE 1 TABLET(100 MCG)  BY MOUTH DAILY BEFORE BREAKFAST (Patient taking differently: Take 100 mcg by mouth daily before breakfast.), Disp: 30 tablet, Rfl: 5   losartan (COZAAR) 100 MG tablet, Take 100 mg by mouth daily. , Disp: , Rfl:    omeprazole (PRILOSEC) 40 MG capsule, Take 1 capsule (40 mg total) by mouth 2 (two) times daily., Disp: 180 capsule, Rfl: 0   oxyCODONE-acetaminophen (PERCOCET/ROXICET) 5-325 MG tablet, Take 1 tablet by mouth in the morning and at bedtime., Disp: , Rfl:    polyethylene glycol (MIRALAX / GLYCOLAX) packet, Take 17 g by mouth daily as needed for mild constipation., Disp: , Rfl:    pravastatin (PRAVACHOL) 40 MG tablet, Take 40 mg by mouth at bedtime., Disp: , Rfl:    pregabalin (LYRICA) 150 MG capsule, Take 150 mg by mouth  in the morning, at noon, and at bedtime., Disp: , Rfl:    primidone (MYSOLINE) 50 MG tablet, Take 1 tablet (50 mg total) by mouth 3 (three) times daily., Disp: 90 tablet, Rfl: 5   QUEtiapine Fumarate (SEROQUEL XR) 150 MG 24 hr tablet, Take 150 mg by mouth at bedtime., Disp: , Rfl:    traZODone (DESYREL) 100 MG tablet, Take 100 mg by mouth at bedtime., Disp: , Rfl:    venlafaxine (EFFEXOR) 100 MG tablet, TAKE ONE TABLET BY MOUTH DAILY THREE TIMES A DAY, Disp: 90 tablet, Rfl: 0   clonazePAM (KLONOPIN) 0.5 MG tablet, Take 1 tablet (0.5 mg total) by mouth 2 (two) times daily., Disp: 60 tablet, Rfl: 5   lamoTRIgine (LAMICTAL) 150 MG tablet, Take 1 tablet by mouth 2 (two) times daily. , Disp: , Rfl:    metoprolol succinate (TOPROL XL) 50 MG 24 hr tablet, Take 1 tablet (50 mg total) by mouth daily. Take with or immediately following a meal., Disp: 90 tablet, Rfl: 4  PAST MEDICAL HISTORY: Past Medical History:  Diagnosis Date   Anxiety    Cervical radiculopathy    Chronic back pain    R > L   Chronic neck pain    Depression    Dysphagia    functional --  takes small bites   Fibromyalgia    GERD (gastroesophageal reflux disease)    Hemorrhoids    History of ankle fracture  01/08/2012   Status post open reduction internal fixation of a left ankle trimalleolar fracture by Dr. Iona Hansen on 01/10/2012.     History of benign thyroid tumor    History of diabetes mellitus, type II    was diet controlled -- per pt pcp she was no longer diabetic   History of hiatal hernia    History of TIA (transient ischemic attack)    02-27-2009  no residual   HTN (hypertension)    Hyperlipidemia    Hypothyroidism, postsurgical    Lumbar radicular pain    R>L legs   Migraine headache    Mild intermittent asthma with allergic rhinitis without complication    PONV (postoperative nausea and vomiting)    Rotator cuff syndrome of right shoulder    Urge urinary incontinence    refractory   Wears glasses     PAST SURGICAL HISTORY: Past Surgical History:  Procedure Laterality Date   CARDIAC CATHETERIZATION  01-17-2003  dr Darnell Level brodie   normal coronary arteries and LVF,  ef 60%   EXCISION MORTON'S NEUROMA  2002   bilateral feet   EXCISION OF BREAST BIOPSY Right 1990's   benign   INTERSTIM IMPLANT PLACEMENT N/A 02/10/2015   Procedure: Barrie Lyme IMPLANT FIRST STAGE;  Surgeon: Bjorn Loser, MD;  Location: [facility];  Service: Urology;  Laterality: N/A;   INTERSTIM IMPLANT PLACEMENT N/A 02/10/2015   Procedure: Barrie Lyme IMPLANT SECOND STAGE;  Surgeon: Bjorn Loser, MD;  Location: [facility];  Service: Urology;  Laterality: N/A;   LAPAROSCOPIC CHOLECYSTECTOMY  2000   NEGATIVE SLEEP STUDY  2000   per pt   ORIF ANKLE FRACTURE  01/10/2012   Procedure: OPEN REDUCTION INTERNAL FIXATION (ORIF) ANKLE FRACTURE;  Surgeon: Sanjuana Kava, MD;  Location: [facility];  Service: Orthopedics;  Laterality: Left;   TOTAL THYROIDECTOMY  1985   benign tumor   TRANSTHORACIC ECHOCARDIOGRAM  03-02-2009   normal echo,  ef 55-60%   VAGINAL HYSTERECTOMY  1984   WRIST SURGERY Left 2000  FAMILY HISTORY: Family History  Problem Relation Age of Onset   Cancer  Other        breast -- grandmother   Cancer Other        lung -- uncles (61)   Leukemia Sister    Diabetes Father    Coronary artery disease Father    Diabetes Mother    Dementia Mother     SOCIAL HISTORY:  Social History   Socioeconomic History   Marital status: Married    Spouse name: Avryl Sinibaldi   Number of children: 2   Years of education: 12   Highest education level: Not on file  Occupational History   Occupation: Retired  Tobacco Use   Smoking status: Never   Smokeless tobacco: Never  Substance and Sexual Activity   Alcohol use: No   Drug use: No   Sexual activity: Not on file  Other Topics Concern   Not on file  Social History Narrative   Lives with Lakoda Crees (husband) and Interior and spatial designer (grandson)   Caffeine use: 7 cups per day   Social Determinants of Health   Financial Resource Strain: Not on file  Food Insecurity: Not on file  Transportation Needs: Not on file  Physical Activity: Not on file  Stress: Not on file  Social Connections: Not on file  Intimate Partner Violence: Not on file     PHYSICAL EXAM  Vitals:   09/28/22 1002  BP: 139/85  Pulse: 75  Weight: 190 lb 8 oz (86.4 kg)  Height: 5\' 3"  (1.6 m)     Body mass index is 33.75 kg/m.   General: The patient is well-developed and well-nourished and in no acute distress  HEENT:  Head is [place]/AT.  Sclera are anicteric.   She is tender over the right > left occiput/splenius capitis muscle  Skin: Extremities are without rash or edema.   Neurologic Exam  Mental status: The patient is alert and oriented x 3 at the time of the examination. The patient has apparent normal recent and remote memory, with an apparently normal attention span and concentration ability.   Speech is normal.  Cranial nerves: Extraocular movements are full   Facial symmetry is present.  Facial strength is normal.  No dysarthria is noted.   . No obvious hearing deficits are noted.  Motor: There is a 6 Hz tremor in the  hands and face that worsens with intention (writing, mimic utensil).   Muscle bulk is normal.   Tone is normal. Strength is  5 / 5 in all 4 extremities.   Sensory: Sensory testing is intact to touch and vib  Coordination: Cerebellar testing reveals good finger-nose-finger and heel-to-shin bilaterally.  Gait and station: Station is normal.   Gait is just slightly wide. Tandem gait is moderately wide .she is able to turn 180 degrees in 3 steps. Moderate retropulsion with posture disturbed.  Romberg is negative.  Reflexes: Deep tendon reflexes are 3 at knees .  No ankle clonus .      DIAGNOSTIC DATA (LABS, IMAGING, TESTING) - I reviewed patient records, labs, notes, testing and imaging myself where available.  Lab Results  Component Value Date   WBC 5.3 08/24/2020   HGB 11.7 (L) 08/24/2020   HCT 37.2 08/24/2020   MCV 95.1 08/24/2020   PLT 250 08/24/2020      Component Value Date/Time   NA 141 08/24/2020 1758   NA 141 06/15/2015 0954   K 4.1 08/24/2020 1758   CL 104  08/24/2020 1758   CO2 25 08/24/2020 1758   GLUCOSE 95 08/24/2020 1758   BUN 9 08/24/2020 1758   BUN 15 06/15/2015 0954   CREATININE 0.75 08/24/2020 1758   CREATININE 0.67 09/18/2014 1145   CALCIUM 9.2 08/24/2020 1758   PROT 7.7 07/25/2018 1417   ALBUMIN 4.9 07/25/2018 1417   AST 20 07/25/2018 1417   ALT 21 07/25/2018 1417   ALKPHOS 98 07/25/2018 1417   BILITOT 0.7 07/25/2018 1417   GFRNONAA >60 08/24/2020 1758   GFRNONAA >89 09/18/2014 1145   GFRAA >60 07/25/2018 1417   GFRAA >89 09/18/2014 1145   Lab Results  Component Value Date   CHOL 170 09/18/2014   HDL 63 09/18/2014   LDLCALC 81 09/18/2014   TRIG 128 09/18/2014   CHOLHDL 2.7 09/18/2014   Lab Results  Component Value Date   HGBA1C 5.8 02/27/2015   Lab Results  Component Value Date   [id number] 12/30/2009   Lab Results  Component Value Date   TSH 2.680 06/15/2015       ASSESSMENT AND PLAN  Tremor  Essential tremor  Gait  disturbance  Risk for falls  Cervical spinal stenosis  Neck pain  Other headache syndrome   1.   The DATScan was negative and tremor more c/w BET than PD (though she has retropulsion)    She has responded better to BET med's than PD med's.   2.    Continue  metoprolol to XL 50 mg  Clonazepam 0.5 mg po bid  3.   She has moderate spinal stenosis but does not have long track signs..   4.    If occipital pain worsens can do another TPI. 5.   rtc 6 month   Keeghan Bialy A. Felecia Shelling, MD, [facility] [id number], [id number] AM Certified in Neurology, Clinical Neurophysiology, Sleep Medicine and Neuroimaging  [facility] [address], [place], [place] [address] [phone]

## 2022-10-09 ENCOUNTER — Other Ambulatory Visit: Payer: Self-pay | Admitting: Neurology

## 2022-10-10 NOTE — Telephone Encounter (Signed)
Last seen on 09/28/22 Rx was sent on 09/28/22 #90 tablets with refills.

## 2023-03-27 NOTE — Progress Notes (Unsigned)
[facility]  PATIENT: [name] [name] DOB: 19-Sep-1945  REFERRING DOCTOR OR PCP: Elizabeth Palau, FNP SOURCE: Patient, notes from primary care, notes in epic, information about InterStim device, MRI of the cervical spine from 2013  _________________________________   HISTORICAL  CHIEF COMPLAINT:  No chief complaint on file.   HISTORY OF PRESENT ILLNESS:  [name] [name] is a 77 y.o. woman with tremor/shakes and worsening gait.  Update 03/28/2023 Prior visit with Dr. Epimenio Foot 09/28/2022.   DaTscan showed "Normal Ioflupane scan. No reduced radiotracer activity in basal ganglia to suggest Parkinson's syndrome pathology."     started metoprolol 50 mg daily after DaTscan.  This helped some and the addition of clonazepma helped further.     She had been on primidone but sopped - unclear it was helping,       The tremor is hands, lips  ,head.   Gait is mildly reduced.   No falls.   Mild difficulty rising from a chair.   Tremor is worse with intention and emotion (being upset).     Anxiety is also better on thee clonazepam  Her occipital HA improved with the splenius capitus trigger point injections  - much better x several months and still better now than last visit.        Tremor/gait history from early 10/22/2019 She noted tremors in her right > left hand and more difficulty with gait around age 57.    She notes fine tasks like painting her nails, keyboarding holding a utensil bring the tremor out more.    She notes shaking in her head and neck and sometimes in her body when she wakes up.    She notes upper body jerks when she is drowsy.   She also notes facial movements when she is tired.    She notes red +uced gait.   She feels balance is more of a problem than strength.  She hits the walls at times as she walks and notes falling more to the right.    She ha no recent falls.   Steps are normal sized.   She feels her turning is more off balanced..    A couple years ago,  she reports a fall in [place] hurting her left side.   She fell without any reason -- no slip or trip.       Data: MRI of the cervical spine from 08/23/2011 showed:Marland Kitchen  There are degenerative changes, worse at C6-C7 but no spinal cord changes or significant spinal stenosis  CT cervical spine 7/15/02021 showed "Cervical disc and facet degeneration with moderate spinal stenosis at C5-6 and mild spinal stenosis at C3-4 and C6-7." and multilevel foraminal narrowing (moderate to the right at C3C4, C4C5, moderate to left at C6C7.     CT head 01/23/2020 showed mild chronic microvascular ischemic changes.    Nuclear medicine DaTscan 05/18/2021 was normal   REVIEW OF SYSTEMS: Constitutional: She reports fevers, chills, weight gain, fatigue, insomnia Eyes: She reports eye pain. Ear, nose and throat: She reports hearing loss with tinnitus on the left. Cardiovascular: No chest pain, palpitations Respiratory: She reports shortness of breath, cough and snoring GastrointestinaI: No nausea, vomiting, diarrhea, abdominal pain, fecal incontinence Genitourinary: She has incontinence and has an InterStim device.  Musculoskeletal: She reports neck pain, back pain, myalgias and joint pain. Integumentary: She reports pruritus and mild.   Neurological: as above Psychiatric: She sees psychiatry for bipolar disorder. Endocrine: She reports feeling hot or cold at times and  increased thirst. Hematologic/Lymphatic:  No anemia, purpura, petechiae. Allergic/Immunologic: No itchy/runny eyes, nasal congestion, recent allergic reactions, rashes  ALLERGIES: Allergies  Allergen Reactions   Diclofenac Sodium Anaphylaxis and Swelling    "almost died" caused neck to swell.    Diclofenac Sodium Anaphylaxis and Swelling    "almost died" caused neck to swell.    Nsaids Anaphylaxis   Penicillins Swelling and Rash    Did it involve swelling of the face/tongue/throat, SOB, or low BP? Y Did it involve sudden or severe rash/hives,  skin peeling, or any reaction on the inside of your mouth or nose? Y Did you need to seek medical attention at a hospital or doctor's office? n When did it last happen?   more than 10 years ago If all above answers are "NO", may proceed with cephalosporin use.    Relafen [Nabumetone] Anaphylaxis and Swelling   Other Other (See Comments) and Swelling    Honey causes throat to swell. Strawberries cause a rash.  "turnes red, swells, and pulls skin off" "turnes red, swells, and pulls skin off"   Adhesive [Tape] Swelling    "turnes red, swells, and pulls skin off"   Codeine Nausea And Vomiting    severe   Ex-Lax [Senna] Other (See Comments)    Pt states "my fingers turned black"   Trazodone And Nefazodone Other (See Comments)    Possible akathisia   Neosporin [Neomycin-Bacitracin Zn-Polymyx] Rash    HOME MEDICATIONS:  Current Outpatient Medications:    albuterol (PROVENTIL HFA;VENTOLIN HFA) 108 (90 Base) MCG/ACT inhaler, Inhale 2 puffs into the lungs every 4 (four) hours as needed for wheezing or shortness of breath. , Disp: , Rfl:    ARIPiprazole (ABILIFY) 15 MG tablet, Take 15 mg by mouth daily., Disp: , Rfl:    aspirin 325 MG tablet, Take 325 mg by mouth daily. Takes 1/4 tablet daily, Disp: , Rfl:    CALCIUM PO, Take 600 mg by mouth daily., Disp: , Rfl:    carbidopa-levodopa (SINEMET IR) 25-100 MG tablet, TAKE ONE TABLET BY MOUTH THREE TIMES A DAY, Disp: 90 tablet, Rfl: 5   clonazePAM (KLONOPIN) 0.5 MG tablet, Take 1 tablet (0.5 mg total) by mouth 2 (two) times daily., Disp: 60 tablet, Rfl: 5   famotidine (PEPCID) 20 MG tablet, Take 20 mg by mouth daily., Disp: , Rfl:    lamoTRIgine (LAMICTAL) 150 MG tablet, Take 1 tablet by mouth 2 (two) times daily. , Disp: , Rfl:    levothyroxine (SYNTHROID, LEVOTHROID) 100 MCG tablet, TAKE 1 TABLET(100 MCG) BY MOUTH DAILY BEFORE BREAKFAST (Patient taking differently: Take 100 mcg by mouth daily before breakfast.), Disp: 30 tablet, Rfl: 5   losartan  (COZAAR) 100 MG tablet, Take 100 mg by mouth daily. , Disp: , Rfl:    metoprolol succinate (TOPROL XL) 50 MG 24 hr tablet, Take 1 tablet (50 mg total) by mouth daily. Take with or immediately following a meal., Disp: 90 tablet, Rfl: 4   omeprazole (PRILOSEC) 40 MG capsule, Take 1 capsule (40 mg total) by mouth 2 (two) times daily., Disp: 180 capsule, Rfl: 0   oxyCODONE-acetaminophen (PERCOCET/ROXICET) 5-325 MG tablet, Take 1 tablet by mouth in the morning and at bedtime., Disp: , Rfl:    polyethylene glycol (MIRALAX / GLYCOLAX) packet, Take 17 g by mouth daily as needed for mild constipation., Disp: , Rfl:    pravastatin (PRAVACHOL) 40 MG tablet, Take 40 mg by mouth at bedtime., Disp: , Rfl:    pregabalin (LYRICA) 150  MG capsule, Take 150 mg by mouth in the morning, at noon, and at bedtime., Disp: , Rfl:    primidone (MYSOLINE) 50 MG tablet, Take 1 tablet (50 mg total) by mouth 3 (three) times daily., Disp: 90 tablet, Rfl: 5   QUEtiapine Fumarate (SEROQUEL XR) 150 MG 24 hr tablet, Take 150 mg by mouth at bedtime., Disp: , Rfl:    traZODone (DESYREL) 100 MG tablet, Take 100 mg by mouth at bedtime., Disp: , Rfl:    venlafaxine (EFFEXOR) 100 MG tablet, TAKE ONE TABLET BY MOUTH DAILY THREE TIMES A DAY, Disp: 90 tablet, Rfl: 0  PAST MEDICAL HISTORY: Past Medical History:  Diagnosis Date   Anxiety    Cervical radiculopathy    Chronic back pain    R > L   Chronic neck pain    Depression    Dysphagia    functional --  takes small bites   Fibromyalgia    GERD (gastroesophageal reflux disease)    Hemorrhoids    History of ankle fracture 01/08/2012   Status post open reduction internal fixation of a left ankle trimalleolar fracture by Dr. Teola Bradley on 01/10/2012.     History of benign thyroid tumor    History of diabetes mellitus, type II    was diet controlled -- per pt pcp she was no longer diabetic   History of hiatal hernia    History of TIA (transient ischemic attack)    02-27-2009   no residual   HTN (hypertension)    Hyperlipidemia    Hypothyroidism, postsurgical    Lumbar radicular pain    R>L legs   Migraine headache    Mild intermittent asthma with allergic rhinitis without complication    PONV (postoperative nausea and vomiting)    Rotator cuff syndrome of right shoulder    Urge urinary incontinence    refractory   Wears glasses     PAST SURGICAL HISTORY: Past Surgical History:  Procedure Laterality Date   CARDIAC CATHETERIZATION  01-17-2003  dr Smitty Cords brodie   normal coronary arteries and LVF,  ef 60%   EXCISION MORTON'S NEUROMA  2002   bilateral feet   EXCISION OF BREAST BIOPSY Right 1990's   benign   INTERSTIM IMPLANT PLACEMENT N/A 02/10/2015   Procedure: Leane Platt IMPLANT FIRST STAGE;  Surgeon: Alfredo Martinez, MD;  Location: [facility];  Service: Urology;  Laterality: N/A;   INTERSTIM IMPLANT PLACEMENT N/A 02/10/2015   Procedure: Leane Platt IMPLANT SECOND STAGE;  Surgeon: Alfredo Martinez, MD;  Location: [facility];  Service: Urology;  Laterality: N/A;   LAPAROSCOPIC CHOLECYSTECTOMY  2000   NEGATIVE SLEEP STUDY  2000   per pt   ORIF ANKLE FRACTURE  01/10/2012   Procedure: OPEN REDUCTION INTERNAL FIXATION (ORIF) ANKLE FRACTURE;  Surgeon: Darreld Mclean, MD;  Location: [facility];  Service: Orthopedics;  Laterality: Left;   TOTAL THYROIDECTOMY  1985   benign tumor   TRANSTHORACIC ECHOCARDIOGRAM  03-02-2009   normal echo,  ef 55-60%   VAGINAL HYSTERECTOMY  1984   WRIST SURGERY Left 2000    FAMILY HISTORY: Family History  Problem Relation Age of Onset   Cancer Other        breast -- grandmother   Cancer Other        lung -- uncles (3)   Leukemia Sister    Diabetes Father    Coronary artery disease Father    Diabetes Mother    Dementia Mother  SOCIAL HISTORY:  Social History   Socioeconomic History   Marital status: Married    Spouse name: Galina Orea   Number of children: 2   Years of education: 12    Highest education level: Not on file  Occupational History   Occupation: Retired  Tobacco Use   Smoking status: Never   Smokeless tobacco: Never  Substance and Sexual Activity   Alcohol use: No   Drug use: No   Sexual activity: Not on file  Other Topics Concern   Not on file  Social History Narrative   Lives with Pahoua Kalp (husband) and Remo Lipps (grandson)   Caffeine use: 7 cups per day   Social Determinants of Health   Financial Resource Strain: Low Risk  (11/29/2022)   Received from [facility]   Overall Financial Resource Strain (CARDIA)    Difficulty of Paying Living Expenses: Not hard at all  Food Insecurity: No Food Insecurity (11/29/2022)   Received from [facility]   Hunger Vital Sign    Worried About Running Out of Food in the Last Year: Never true    Ran Out of Food in the Last Year: Never true  Transportation Needs: No Transportation Needs (11/29/2022)   Received from [facility] - Transportation    Lack of Transportation (Medical): No    Lack of Transportation (Non-Medical): No  Physical Activity: Inactive (11/29/2022)   Received from [facility]   Exercise Vital Sign    Days of Exercise per Week: 0 days    Minutes of Exercise per Session: 10 min  Stress: Stress Concern Present (11/29/2022)   Received from [facility] - Occupational Stress Questionnaire    Feeling of Stress : Very much  Social Connections: Socially Integrated (11/29/2022)   Received from [facility]   Social Network    How would you rate your social network (family, work, friends)?: Good participation with social networks  Intimate Partner Violence: Not At Risk (11/29/2022)   Received from [facility]   HITS    Over the last 12 months how often did your partner physically hurt you?: 1    Over the last 12 months how often did your partner insult you or talk down to you?: 1    Over the last 12 months how often did your  partner threaten you with physical harm?: 1    Over the last 12 months how often did your partner scream or curse at you?: 1     PHYSICAL EXAM  There were no vitals filed for this visit.    There is no height or weight on file to calculate BMI.   General: The patient is well-developed and well-nourished and in no acute distress  HEENT:  Head is [place]/AT.  Sclera are anicteric.   She is tender over the right > left occiput/splenius capitis muscle  Skin: Extremities are without rash or edema.   Neurologic Exam  Mental status: The patient is alert and oriented x 3 at the time of the examination. The patient has apparent normal recent and remote memory, with an apparently normal attention span and concentration ability.   Speech is normal.  Cranial nerves: Extraocular movements are full   Facial symmetry is present.  Facial strength is normal.  No dysarthria is noted.   . No obvious hearing deficits are noted.  Motor: There is a 6 Hz tremor in the hands and face that worsens with intention (writing,  mimic utensil).   Muscle bulk is normal.   Tone is normal. Strength is  5 / 5 in all 4 extremities.   Sensory: Sensory testing is intact to touch and vib  Coordination: Cerebellar testing reveals good finger-nose-finger and heel-to-shin bilaterally.  Gait and station: Station is normal.   Gait is just slightly wide. Tandem gait is moderately wide .she is able to turn 180 degrees in 3 steps. Moderate retropulsion with posture disturbed.  Romberg is negative.  Reflexes: Deep tendon reflexes are 3 at knees .  No ankle clonus .      DIAGNOSTIC DATA (LABS, IMAGING, TESTING) - I reviewed patient records, labs, notes, testing and imaging myself where available.  Lab Results  Component Value Date   WBC 5.3 08/24/2020   HGB 11.7 (L) 08/24/2020   HCT 37.2 08/24/2020   MCV 95.1 08/24/2020   PLT 250 08/24/2020      Component Value Date/Time   NA 141 08/24/2020 1758   NA 141 06/15/2015  0954   K 4.1 08/24/2020 1758   CL 104 08/24/2020 1758   CO2 25 08/24/2020 1758   GLUCOSE 95 08/24/2020 1758   BUN 9 08/24/2020 1758   BUN 15 06/15/2015 0954   CREATININE 0.75 08/24/2020 1758   CREATININE 0.67 09/18/2014 1145   CALCIUM 9.2 08/24/2020 1758   PROT 7.7 07/25/2018 1417   ALBUMIN 4.9 07/25/2018 1417   AST 20 07/25/2018 1417   ALT 21 07/25/2018 1417   ALKPHOS 98 07/25/2018 1417   BILITOT 0.7 07/25/2018 1417   GFRNONAA >60 08/24/2020 1758   GFRNONAA >89 09/18/2014 1145   GFRAA >60 07/25/2018 1417   GFRAA >89 09/18/2014 1145   Lab Results  Component Value Date   CHOL 170 09/18/2014   HDL 63 09/18/2014   LDLCALC 81 09/18/2014   TRIG 128 09/18/2014   CHOLHDL 2.7 09/18/2014   Lab Results  Component Value Date   HGBA1C 5.8 02/27/2015   Lab Results  Component Value Date   VITAMINB12 506 12/30/2009   Lab Results  Component Value Date   TSH 2.680 06/15/2015       ASSESSMENT AND PLAN  No diagnosis found.   1.   The DATScan was negative and tremor more c/w BET than PD (though she has retropulsion)    She has responded better to BET med's than PD med's.   2.    Continue  metoprolol to XL 50 mg  Clonazepam 0.5 mg po bid  3.   She has moderate spinal stenosis but does not have long track signs..   4.    If occipital pain worsens can do another TPI. 5.   rtc 6 month      I spent *** minutes of face-to-face and non-face-to-face time with patient.  This included previsit chart review, lab review, study review, order entry, electronic health record documentation, patient education and discussion regarding above diagnoses and treatment plan and answered all other questions to patient's satisfaction  Ihor Austin, [facility]  [facility] [address] [place], [place] [address]  Phone [phone] Fax [phone] Note: This document was prepared with digital dictation and possible smart phrase technology. Any transcriptional  errors that result from this process are unintentional.

## 2023-03-28 ENCOUNTER — Encounter: Payer: Self-pay | Admitting: Adult Health

## 2023-03-28 ENCOUNTER — Ambulatory Visit: Payer: Medicare HMO | Admitting: Adult Health

## 2023-03-28 VITALS — BP 118/74 | HR 81 | Ht 66.0 in | Wt 163.0 lb

## 2023-03-28 DIAGNOSIS — M4802 Spinal stenosis, cervical region: Secondary | ICD-10-CM | POA: Diagnosis not present

## 2023-03-28 DIAGNOSIS — R269 Unspecified abnormalities of gait and mobility: Secondary | ICD-10-CM

## 2023-03-28 DIAGNOSIS — G4489 Other headache syndrome: Secondary | ICD-10-CM

## 2023-03-28 DIAGNOSIS — G250 Essential tremor: Secondary | ICD-10-CM

## 2023-03-28 NOTE — Patient Instructions (Addendum)
Your Plan:  Continue metoprolol 50mg  daily and klonopin as needed - can use extra dose as needed for worsening symptoms  Follow up with your PCP regarding forearm burning sensation  Suspect difficulty remembering names is likely from underlying stressors and anxiety, some of your medications could also be contributing. Please ensure you further discuss this with your psychiatrist at follow up visit  Schedule follow up visit with Dr. Epimenio Foot for repeat trigger point injections        Thank you for coming to see [place] at [facility]. I hope we have been able to provide you high quality care today.  You may receive a patient satisfaction survey over the next few weeks. We would appreciate your feedback and comments so that we may continue to improve ourselves and the health of our patients.

## 2023-06-10 ENCOUNTER — Other Ambulatory Visit: Payer: Self-pay | Admitting: Neurology

## 2023-06-13 NOTE — Telephone Encounter (Signed)
Last seen on 03/28/23 per note " Continue  metoprolol to XL 50 mg and Clonazepam 0.5 mg po bid, takes only 1 per day, discussed trying 2nd dose as needed for worsening tremor (with increased anxiety or mood)"  Follow up scheduled on 10/04/23 Last filled on 01/13/23 #60 tablets (30 day supply) Rx pending to be signed

## 2023-09-29 ENCOUNTER — Other Ambulatory Visit: Payer: Self-pay | Admitting: Neurology

## 2023-10-02 NOTE — Telephone Encounter (Signed)
 Last seen on 03/28/23 Follow up scheduled on 10/04/23

## 2023-10-04 ENCOUNTER — Ambulatory Visit: Payer: Medicare HMO | Admitting: Neurology

## 2023-10-04 ENCOUNTER — Encounter: Payer: Self-pay | Admitting: Neurology

## 2023-10-04 VITALS — BP 127/70 | HR 71 | Ht 60.0 in | Wt 155.0 lb

## 2023-10-04 DIAGNOSIS — M542 Cervicalgia: Secondary | ICD-10-CM

## 2023-10-04 DIAGNOSIS — M4802 Spinal stenosis, cervical region: Secondary | ICD-10-CM

## 2023-10-04 DIAGNOSIS — R269 Unspecified abnormalities of gait and mobility: Secondary | ICD-10-CM | POA: Diagnosis not present

## 2023-10-04 DIAGNOSIS — R251 Tremor, unspecified: Secondary | ICD-10-CM | POA: Diagnosis not present

## 2023-10-04 DIAGNOSIS — G250 Essential tremor: Secondary | ICD-10-CM

## 2023-10-04 MED ORDER — BUPIVACAINE HCL 0.5 % IJ SOLN: 3.0000 mL | Freq: Once | INTRAMUSCULAR | Status: AC

## 2023-10-04 MED ORDER — METHYLPREDNISOLONE ACETATE 80 MG/ML IJ SUSP
80.0000 mg | Freq: Once | INTRAMUSCULAR | Status: AC
  Administered 2023-10-04: 80 mg via INTRAMUSCULAR

## 2023-10-04 MED ORDER — CLONAZEPAM 0.5 MG PO TABS: 0.5000 mg | ORAL_TABLET | Freq: Two times a day (BID) | ORAL | 3 refills | Status: DC

## 2023-10-04 NOTE — Progress Notes (Signed)
 [facility]  PATIENT: [name] [name] DOB: 1946/06/14  REFERRING DOCTOR OR PCP: Elizabeth Palau, FNP SOURCE: Patient, notes from primary care, notes in epic, information about InterStim device, MRI of the cervical spine from 2013  _________________________________   HISTORICAL  CHIEF COMPLAINT:  Chief Complaint  Patient presents with   Follow-up    Pt in 11 with husband Pt states tremors better Pt states neck pain is worse Pt states neck pain starts in right shoulder and travels to back of head Pt states 2 falls in last six months     HISTORY OF PRESENT ILLNESS:  [name] [name] is a 78 y.o. woman with tremor/shakes and worsening gait.  Update 09/28/2022 She continues to have tremors in arms and legs.   She had a DATScan that showed "Normal Ioflupane scan. No reduced radiotracer activity in basal ganglia to suggest Parkinson's syndrome pathology."     After the DATscan, we started metoprolol 50 mg daily.  Se continued clonazepam which was helping.     She had been on primidone but sopped - unclear it was helping,     Sinemet had been tried with no benefit when etiology was less clear.   She is on Abilify but no change in symptoms were noted when she started.   She has never been on a neuroleptic to her knowledge.    The tremor is hands, lips  ,head.   Gait is mildly reduced.  She fell twice to her left..   She has retropulsion Mild difficulty rising from a chair.   Tremor is worse with intention and emotion (being upset).     Anxiety is also better on thee clonazepam  Her occipital HA improved with the splenius capitus trigger point injections .  However, having more neck pain and stiffness and inquires about another shot.    Tremor/gait history from early 10/22/2019 She noted tremors in her right > left hand and more difficulty with gait around age 77.    She notes fine tasks like painting her nails, keyboarding holding a utensil bring the tremor out more.    She  notes shaking in her head and neck and sometimes in her body when she wakes up.    She notes upper body jerks when she is drowsy.   She also notes facial movements when she is tired.    She notes reduced gait.   She feels balance is more of a problem than strength.  She hits the walls at times as she walks and notes falling more to the right.    She ha no recent falls.   Steps are normal sized.   She feels her turning is more off balanced..    A couple years ago, she reports a fall in [place] hurting her left side.   She fell without any reason -- no slip or trip.       Data: MRI of the cervical spine from 08/23/2011 showed:Marland Kitchen  There are degenerative changes, worse at C6-C7 but no spinal cord changes or significant spinal stenosis  CT cervical spine 7/15/02021 showed "Cervical disc and facet degeneration with moderate spinal stenosis at C5-6 and mild spinal stenosis at C3-4 and C6-7." and multilevel foraminal narrowing (moderate to the right at C3C4, C4C5, moderate to left at C6C7.     CT head 01/23/2020 showed mild chronic microvascular ischemic changes.    Nuclear medicine DaTscan 05/18/2021 was normal   REVIEW OF SYSTEMS: Constitutional: She reports fevers, chills, weight  gain, fatigue, insomnia Eyes: She reports eye pain. Ear, nose and throat: She reports hearing loss with tinnitus on the left. Cardiovascular: No chest pain, palpitations Respiratory: She reports shortness of breath, cough and snoring GastrointestinaI: No nausea, vomiting, diarrhea, abdominal pain, fecal incontinence Genitourinary: She has incontinence and has an InterStim device.  Musculoskeletal: She reports neck pain, back pain, myalgias and joint pain. Integumentary: She reports pruritus and mild.   Neurological: as above Psychiatric: She sees psychiatry for bipolar disorder. Endocrine: She reports feeling hot or cold at times and increased thirst. Hematologic/Lymphatic:  No anemia, purpura,  petechiae. Allergic/Immunologic: No itchy/runny eyes, nasal congestion, recent allergic reactions, rashes  ALLERGIES: Allergies  Allergen Reactions   Diclofenac Sodium Anaphylaxis and Swelling    "almost died" caused neck to swell.    Diclofenac Sodium Anaphylaxis and Swelling    "almost died" caused neck to swell.    Nsaids Anaphylaxis   Penicillins Swelling and Rash    Did it involve swelling of the face/tongue/throat, SOB, or low BP? Y Did it involve sudden or severe rash/hives, skin peeling, or any reaction on the inside of your mouth or nose? Y Did you need to seek medical attention at a hospital or doctor's office? n When did it last happen?   more than 10 years ago If all above answers are "NO", may proceed with cephalosporin use.    Relafen [Nabumetone] Anaphylaxis and Swelling   Other Other (See Comments) and Swelling    Honey causes throat to swell. Strawberries cause a rash.  "turnes red, swells, and pulls skin off" "turnes red, swells, and pulls skin off"   Adhesive [Tape] Swelling    "turnes red, swells, and pulls skin off"   Codeine Nausea And Vomiting    severe   Ex-Lax [Senna] Other (See Comments)    Pt states "my fingers turned black"   Trazodone And Nefazodone Other (See Comments)    Possible akathisia   Neosporin [Neomycin-Bacitracin Zn-Polymyx] Rash    HOME MEDICATIONS:  Current Outpatient Medications:    albuterol (PROVENTIL HFA;VENTOLIN HFA) 108 (90 Base) MCG/ACT inhaler, Inhale 2 puffs into the lungs every 4 (four) hours as needed for wheezing or shortness of breath. , Disp: , Rfl:    ARIPiprazole (ABILIFY) 15 MG tablet, Take 15 mg by mouth daily., Disp: , Rfl:    aspirin 325 MG tablet, Take 325 mg by mouth daily. Takes 1/4 tablet daily, Disp: , Rfl:    CALCIUM PO, Take 600 mg by mouth daily., Disp: , Rfl:    cefdinir (OMNICEF) 300 MG capsule, Take 300 mg by mouth 2 (two) times daily., Disp: , Rfl:    estradiol (ESTRACE) 0.1 MG/GM vaginal cream, Place  1 Applicatorful vaginally daily., Disp: , Rfl:    famotidine (PEPCID) 20 MG tablet, Take 20 mg by mouth daily., Disp: , Rfl:    GEMTESA 75 MG TABS, Take 1 tablet by mouth daily., Disp: , Rfl:    levothyroxine (SYNTHROID, LEVOTHROID) 100 MCG tablet, TAKE 1 TABLET(100 MCG) BY MOUTH DAILY BEFORE BREAKFAST (Patient taking differently: Take 100 mcg by mouth daily before breakfast.), Disp: 30 tablet, Rfl: 5   losartan (COZAAR) 100 MG tablet, Take 100 mg by mouth daily. , Disp: , Rfl:    metoprolol succinate (TOPROL-XL) 50 MG 24 hr tablet, TAKE 1 TABLET BY MOUTH DAILY, Disp: 30 tablet, Rfl: 0   omeprazole (PRILOSEC) 40 MG capsule, Take 1 capsule (40 mg total) by mouth 2 (two) times daily., Disp: 180 capsule, Rfl: 0  oxyCODONE-acetaminophen (PERCOCET/ROXICET) 5-325 MG tablet, Take 1 tablet by mouth in the morning and at bedtime., Disp: , Rfl:    OZEMPIC, 1 MG/DOSE, 4 MG/3ML SOPN, Inject 3 mLs into the skin once a week., Disp: , Rfl:    polyethylene glycol (MIRALAX / GLYCOLAX) packet, Take 17 g by mouth daily as needed for mild constipation., Disp: , Rfl:    pravastatin (PRAVACHOL) 40 MG tablet, Take 40 mg by mouth at bedtime., Disp: , Rfl:    pregabalin (LYRICA) 150 MG capsule, Take 150 mg by mouth in the morning, at noon, and at bedtime., Disp: , Rfl:    PREMARIN vaginal cream, Place 1 applicator vaginally daily., Disp: , Rfl:    QUEtiapine Fumarate (SEROQUEL XR) 150 MG 24 hr tablet, Take 150 mg by mouth at bedtime., Disp: , Rfl:    traZODone (DESYREL) 100 MG tablet, Take 100 mg by mouth at bedtime., Disp: , Rfl:    venlafaxine (EFFEXOR) 100 MG tablet, TAKE ONE TABLET BY MOUTH DAILY THREE TIMES A DAY, Disp: 90 tablet, Rfl: 0   clonazePAM (KLONOPIN) 0.5 MG tablet, Take 1 tablet (0.5 mg total) by mouth 2 (two) times daily., Disp: 60 tablet, Rfl: 3   lamoTRIgine (LAMICTAL) 150 MG tablet, Take 1 tablet by mouth 2 (two) times daily. , Disp: , Rfl:   Current Facility-Administered Medications:    bupivacaine  (MARCAINE) 0.5 % (with pres) injection 3 mL, 3 mL, Infiltration, Once, Hakeem Frazzini, Pearletha Furl, MD  PAST MEDICAL HISTORY: Past Medical History:  Diagnosis Date   Anxiety    Cervical radiculopathy    Chronic back pain    R > L   Chronic neck pain    Depression    Dysphagia    functional --  takes small bites   Fibromyalgia    GERD (gastroesophageal reflux disease)    Hemorrhoids    History of ankle fracture 01/08/2012   Status post open reduction internal fixation of a left ankle trimalleolar fracture by Dr. Teola Bradley on 01/10/2012.     History of benign thyroid tumor    History of diabetes mellitus, type II    was diet controlled -- per pt pcp she was no longer diabetic   History of hiatal hernia    History of TIA (transient ischemic attack)    02-27-2009  no residual   HTN (hypertension)    Hyperlipidemia    Hypothyroidism, postsurgical    Lumbar radicular pain    R>L legs   Migraine headache    Mild intermittent asthma with allergic rhinitis without complication    PONV (postoperative nausea and vomiting)    Rotator cuff syndrome of right shoulder    Urge urinary incontinence    refractory   Wears glasses     PAST SURGICAL HISTORY: Past Surgical History:  Procedure Laterality Date   CARDIAC CATHETERIZATION  01-17-2003  dr Smitty Cords brodie   normal coronary arteries and LVF,  ef 60%   EXCISION MORTON'S NEUROMA  2002   bilateral feet   EXCISION OF BREAST BIOPSY Right 1990's   benign   INTERSTIM IMPLANT PLACEMENT N/A 02/10/2015   Procedure: Leane Platt IMPLANT FIRST STAGE;  Surgeon: Alfredo Martinez, MD;  Location: [facility];  Service: Urology;  Laterality: N/A;   INTERSTIM IMPLANT PLACEMENT N/A 02/10/2015   Procedure: Leane Platt IMPLANT SECOND STAGE;  Surgeon: Alfredo Martinez, MD;  [facility]  Service: Urology;  Laterality: N/A;   LAPAROSCOPIC CHOLECYSTECTOMY  2000   NEGATIVE SLEEP  STUDY  2000   per pt   ORIF ANKLE FRACTURE   01/10/2012   Procedure: OPEN REDUCTION INTERNAL FIXATION (ORIF) ANKLE FRACTURE;  Surgeon: Darreld Mclean, MD;  Location: [facility];  Service: Orthopedics;  Laterality: Left;   TOTAL THYROIDECTOMY  1985   benign tumor   TRANSTHORACIC ECHOCARDIOGRAM  03-02-2009   normal echo,  ef 55-60%   VAGINAL HYSTERECTOMY  1984   WRIST SURGERY Left 2000    FAMILY HISTORY: Family History  Problem Relation Age of Onset   Cancer Other        breast -- grandmother   Cancer Other        lung -- uncles (3)   Leukemia Sister    Diabetes Father    Coronary artery disease Father    Diabetes Mother    Dementia Mother     SOCIAL HISTORY:  Social History   Socioeconomic History   Marital status: Married    Spouse name: Peytin Dechert   Number of children: 2   Years of education: 12   Highest education level: Not on file  Occupational History   Occupation: Retired  Tobacco Use   Smoking status: Never   Smokeless tobacco: Never  Vaping Use   Vaping status: Not on file  Substance and Sexual Activity   Alcohol use: No   Drug use: No   Sexual activity: Not on file  Other Topics Concern   Not on file  Social History Narrative   Lives with Jenavee Laguardia (husband) and Remo Lipps (grandson)   Caffeine use: 7 cups per day   Social Drivers of Health   Financial Resource Strain: Low Risk  (09/27/2023)   Received from [name] Financial Resource Strain (CARDIA)    Difficulty of Paying Living Expenses: Not hard at all  Food Insecurity: No Food Insecurity (09/27/2023)   [facility] from [facility]   Hunger Vital Sign    Worried About Running Out of Food in the Last Year: Never true    Ran Out of Food in the Last Year: Never true  Transportation Needs: No Transportation Needs (09/27/2023)   Received from [facility] - Transportation    Lack of Transportation (Medical): No    Lack of Transportation (Non-Medical): No  Physical Activity: Unknown (11/29/2022)   Received from [facility]   Exercise Vital Sign    Days of Exercise per Week: 0 days    Minutes of Exercise per Session: Not on file  Recent Concern: Physical Activity - Inactive (11/29/2022)   Received from [facility]   Exercise Vital Sign    Days of Exercise per Week: 0 days    Minutes of Exercise per Session: 10 min  Stress: Stress Concern Present (11/29/2022)   Received from [facility] - Occupational Stress Questionnaire    Feeling of Stress : Very much  Social Connections: Socially Integrated (11/29/2022)   Received from [facility]   Social Network    How would you rate your social network (family, work, friends)?: Good participation with social networks  Intimate Partner Violence: Not At Risk (11/29/2022)   Received from [facility]   HITS    Over the last 12 months how often did your partner physically hurt you?: Never    Over the last 12 months how often did your partner insult you or talk down to you?: Never    Over the last 12 months how often  did your partner threaten you with physical harm?: Never    Over the last 12 months how often did your partner scream or curse at you?: Never     PHYSICAL EXAM  Vitals:   10/04/23 1113  BP: 127/70  Pulse: 71  Weight: 155 lb (70.3 kg)  Height: 5' (1.524 m)     Body mass index is 30.27 kg/m.   General: The patient is well-developed and well-nourished and in no acute distress  HEENT:  Head is [place]/AT.  Sclera are anicteric.   She is tender over the right > left lower cervical paraspinal muscles  Skin: Extremities are without rash or edema.   Neurologic Exam  Mental status: The patient is alert and oriented x 3 at the time of the examination. The patient has apparent normal recent and remote memory, with an apparently normal attention span and concentration ability.   Speech is normal.  Cranial nerves: Extraocular movements are full   Facial symmetry is present.  Facial strength is normal.   No dysarthria is noted.   . No obvious hearing deficits are noted.  Motor: There is a 6 Hz tremor in the hands and face that worsens with intention (writing, mimic utensil).   Muscle bulk is normal.   Tone is normal. Strength is  5 / 5 in all 4 extremities.   Sensory: Sensory testing is intact to touch and vib  Coordination: Cerebellar testing reveals good finger-nose-finger and heel-to-shin bilaterally.  Gait and station: Station is normal.   Gait is just slightly wide. Tandem gait is moderately wide .  She is able to turn 180 degrees in 3 steps which is normal for age but moderate retropulsion with posture disturbed.  Romberg is negative.  Reflexes: Deep tendon reflexes are 3 at knees .  No ankle clonus .      DIAGNOSTIC DATA (LABS, IMAGING, TESTING) - I reviewed patient records, labs, notes, testing and imaging myself where available.  Lab Results  Component Value Date   WBC 5.3 08/24/2020   HGB 11.7 (L) 08/24/2020   HCT 37.2 08/24/2020   MCV 95.1 08/24/2020   PLT 250 08/24/2020      Component Value Date/Time   NA 141 08/24/2020 1758   NA 141 06/15/2015 0954   K 4.1 08/24/2020 1758   CL 104 08/24/2020 1758   CO2 25 08/24/2020 1758   GLUCOSE 95 08/24/2020 1758   BUN 9 08/24/2020 1758   BUN 15 06/15/2015 0954   CREATININE 0.75 08/24/2020 1758   CREATININE 0.67 09/18/2014 1145   CALCIUM 9.2 08/24/2020 1758   PROT 7.7 07/25/2018 1417   ALBUMIN 4.9 07/25/2018 1417   AST 20 07/25/2018 1417   ALT 21 07/25/2018 1417   ALKPHOS 98 07/25/2018 1417   BILITOT 0.7 07/25/2018 1417   GFRNONAA >60 08/24/2020 1758   GFRNONAA >89 09/18/2014 1145   GFRAA >60 07/25/2018 1417   GFRAA >89 09/18/2014 1145   Lab Results  Component Value Date   CHOL 170 09/18/2014   HDL 63 09/18/2014   LDLCALC 81 09/18/2014   TRIG 128 09/18/2014   CHOLHDL 2.7 09/18/2014   Lab Results  Component Value Date   HGBA1C 5.8 02/27/2015   Lab Results  Component Value Date   VITAMINB12 506 12/30/2009    Lab Results  Component Value Date   TSH 2.680 06/15/2015       ASSESSMENT AND PLAN  Tremor  Gait disturbance  Neck pain - Plan: bupivacaine (MARCAINE) 0.5 % (with  pres) injection 3 mL, methylPREDNISolone acetate (DEPO-MEDROL) injection 80 mg  Essential tremor  Cervical spinal stenosis   1.   Tremor with mixed characteristics.   The DATScan was negative and tremor more c/w BET than PD (though she has retropulsion)    She has responded better to BET med's than PD med's.   2.    Continue  metoprolol to XL 50 mg  Clonazepam 0.5 mg po bid  3.   She has moderate spinal stenosis but does not have long track signs..  pain is axial.  Neck stiff at times 4.     Bilateral C5-C6 and C6-C7 paraspinal muscle injection with 80 mg Depo-Medrol in 3 cc Marcaine using sterile technique. She tolerated the injection well and pain was better afterwards.  rtc 6 month   Barbra Miner A. Epimenio Foot, MD, [facility] 10/04/2023, 11:47 AM Certified in Neurology, Clinical Neurophysiology, Sleep Medicine and Neuroimaging  [facility] [address] [place], [place] [address] [phone]

## 2023-10-31 ENCOUNTER — Other Ambulatory Visit: Payer: Self-pay

## 2023-10-31 MED ORDER — METOPROLOL SUCCINATE ER 50 MG PO TB24: 50.0000 mg | ORAL_TABLET | Freq: Every day | ORAL | 3 refills | Status: DC

## 2024-01-12 ENCOUNTER — Other Ambulatory Visit: Payer: Self-pay | Admitting: Neurology

## 2024-04-03 NOTE — Progress Notes (Unsigned)
 [facility]  PATIENT: [name] [name] DOB: 03/25/1946  REFERRING DOCTOR OR PCP: Verneita Piety, FNP SOURCE: Patient, notes from primary care, notes in epic, information about InterStim device, MRI of the cervical spine from 2013  _________________________________   HISTORICAL  CHIEF COMPLAINT:  No chief complaint on file.   HISTORY OF PRESENT ILLNESS:  [name] [name] is a 78 y.o. woman with tremor/shakes and worsening gait.  Update 04/04/2024 JM:  She continues to have tremors in arms and legs.   She had a DATScan  that showed Normal Ioflupane scan. No reduced radiotracer activity in basal ganglia to suggest Parkinson's syndrome pathology.     After the DATscan , we started metoprolol  50 mg daily.  Se continued clonazepam  which was helping.     She had been on primidone  but sopped - unclear it was helping,     Sinemet  had been tried with no benefit when etiology was less clear.   She is on Abilify but no change in symptoms were noted when she started.   She has never been on a neuroleptic to her knowledge.    The tremor is hands, lips  ,head.   Gait is mildly reduced.  She fell twice to her left..   She has retropulsion Mild difficulty rising from a chair.   Tremor is worse with intention and emotion (being upset).     Anxiety is also better on thee clonazepam   Her occipital HA improved with the splenius capitus trigger point injections .  However, having more neck pain and stiffness and inquires about another shot.    Tremor/gait history from early 10/22/2019 She noted tremors in her right > left hand and more difficulty with gait around age 63.    She notes fine tasks like painting her nails, keyboarding holding a utensil bring the tremor out more.    She notes shaking in her head and neck and sometimes in her body when she wakes up.    She notes upper body jerks when she is drowsy.   She also notes facial movements when she is tired.    She notes reduced gait.    She feels balance is more of a problem than strength.  She hits the walls at times as she walks and notes falling more to the right.    She ha no recent falls.   Steps are normal sized.   She feels her turning is more off balanced..    A couple years ago, she reports a fall in [place] hurting her left side.   She fell without any reason -- no slip or trip.       Data: MRI of the cervical spine from 08/23/2011 showed:SABRA  There are degenerative changes, worse at C6-C7 but no spinal cord changes or significant spinal stenosis  CT cervical spine 7/15/02021 showed Cervical disc and facet degeneration with moderate spinal stenosis at C5-6 and mild spinal stenosis at C3-4 and C6-7. and multilevel foraminal narrowing (moderate to the right at C3C4, C4C5, moderate to left at C6C7.     CT head 01/23/2020 showed mild chronic microvascular ischemic changes.    Nuclear medicine DaTscan  05/18/2021 was normal   REVIEW OF SYSTEMS: Constitutional: She reports fevers, chills, weight gain, fatigue, insomnia Eyes: She reports eye pain. Ear, nose and throat: She reports hearing loss with tinnitus on the left. Cardiovascular: No chest pain, palpitations Respiratory: She reports shortness of breath, cough and snoring GastrointestinaI: No nausea, vomiting, diarrhea, abdominal pain, fecal incontinence  Genitourinary: She has incontinence and has an InterStim device.  Musculoskeletal: She reports neck pain, back pain, myalgias and joint pain. Integumentary: She reports pruritus and mild.   Neurological: as above Psychiatric: She sees psychiatry for bipolar disorder. Endocrine: She reports feeling hot or cold at times and increased thirst. Hematologic/Lymphatic:  No anemia, purpura, petechiae. Allergic/Immunologic: No itchy/runny eyes, nasal congestion, recent allergic reactions, rashes  ALLERGIES: Allergies  Allergen Reactions   Diclofenac  Sodium Anaphylaxis and Swelling    almost died caused neck to swell.     Diclofenac  Sodium Anaphylaxis and Swelling    almost died caused neck to swell.    Nsaids Anaphylaxis   Penicillins Swelling and Rash    Did it involve swelling of the face/tongue/throat, SOB, or low BP? Y Did it involve sudden or severe rash/hives, skin peeling, or any reaction on the inside of your mouth or nose? Y Did you need to seek medical attention at a hospital or doctor's office? n When did it last happen?   more than 10 years ago If all above answers are "NO", may proceed with cephalosporin use.    Relafen [Nabumetone] Anaphylaxis and Swelling   Other Other (See Comments) and Swelling    Honey causes throat to swell. Strawberries cause a rash.  turnes red, swells, and pulls skin off turnes red, swells, and pulls skin off   Adhesive [Tape] Swelling    turnes red, swells, and pulls skin off   Codeine Nausea And Vomiting    severe   Ex-Lax [Senna] Other (See Comments)    Pt states my fingers turned black   Trazodone And Nefazodone Other (See Comments)    Possible akathisia   Neosporin [Neomycin-Bacitracin Zn-Polymyx] Rash    HOME MEDICATIONS:  Current Outpatient Medications:    albuterol  (PROVENTIL  HFA;VENTOLIN  HFA) 108 (90 Base) MCG/ACT inhaler, Inhale 2 puffs into the lungs every 4 (four) hours as needed for wheezing or shortness of breath. , Disp: , Rfl:    ARIPiprazole (ABILIFY) 15 MG tablet, Take 15 mg by mouth daily., Disp: , Rfl:    aspirin  325 MG tablet, Take 325 mg by mouth daily. Takes 1/4 tablet daily, Disp: , Rfl:    CALCIUM PO, Take 600 mg by mouth daily., Disp: , Rfl:    cefdinir (OMNICEF) 300 MG capsule, Take 300 mg by mouth 2 (two) times daily., Disp: , Rfl:    clonazePAM  (KLONOPIN ) 0.5 MG tablet, Take 1 tablet (0.5 mg total) by mouth 2 (two) times daily., Disp: 60 tablet, Rfl: 3   estradiol (ESTRACE) 0.1 MG/GM vaginal cream, Place 1 Applicatorful vaginally daily., Disp: , Rfl:    famotidine (PEPCID) 20 MG tablet, Take 20 mg by mouth daily., Disp:  , Rfl:    GEMTESA 75 MG TABS, Take 1 tablet by mouth daily., Disp: , Rfl:    lamoTRIgine (LAMICTAL) 150 MG tablet, Take 1 tablet by mouth 2 (two) times daily. , Disp: , Rfl:    levothyroxine  (SYNTHROID , LEVOTHROID) 100 MCG tablet, TAKE 1 TABLET(100 MCG) BY MOUTH DAILY BEFORE BREAKFAST (Patient taking differently: Take 100 mcg by mouth daily before breakfast.), Disp: 30 tablet, Rfl: 5   losartan  (COZAAR ) 100 MG tablet, Take 100 mg by mouth daily. , Disp: , Rfl:    metoprolol  succinate (TOPROL -XL) 50 MG 24 hr tablet, TAKE 1 TABLET BY MOUTH DAILY, Disp: 90 tablet, Rfl: 2   omeprazole  (PRILOSEC) 40 MG capsule, Take 1 capsule (40 mg total) by mouth 2 (two) times daily., Disp: 180 capsule, Rfl:  0   oxyCODONE -acetaminophen  (PERCOCET/ROXICET) 5-325 MG tablet, Take 1 tablet by mouth in the morning and at bedtime., Disp: , Rfl:    OZEMPIC, 1 MG/DOSE, 4 MG/3ML SOPN, Inject 3 mLs into the skin once a week., Disp: , Rfl:    polyethylene glycol (MIRALAX  / GLYCOLAX ) packet, Take 17 g by mouth daily as needed for mild constipation., Disp: , Rfl:    pravastatin  (PRAVACHOL ) 40 MG tablet, Take 40 mg by mouth at bedtime., Disp: , Rfl:    pregabalin  (LYRICA ) 150 MG capsule, Take 150 mg by mouth in the morning, at noon, and at bedtime., Disp: , Rfl:    PREMARIN vaginal cream, Place 1 applicator vaginally daily., Disp: , Rfl:    QUEtiapine Fumarate (SEROQUEL XR) 150 MG 24 hr tablet, Take 150 mg by mouth at bedtime., Disp: , Rfl:    traZODone (DESYREL) 100 MG tablet, Take 100 mg by mouth at bedtime., Disp: , Rfl:    venlafaxine  (EFFEXOR ) 100 MG tablet, TAKE ONE TABLET BY MOUTH DAILY THREE TIMES A DAY, Disp: 90 tablet, Rfl: 0  Current Facility-Administered Medications:    bupivacaine  (MARCAINE ) 0.5 % (with pres) injection 3 mL, 3 mL, Infiltration, Once, Sater, Charlie LABOR, MD  PAST MEDICAL HISTORY: Past Medical History:  Diagnosis Date   Anxiety    Cervical radiculopathy    Chronic back pain    R > L   Chronic neck  pain    Depression    Dysphagia    functional --  takes small bites   Fibromyalgia    GERD (gastroesophageal reflux disease)    Hemorrhoids    History of ankle fracture 01/08/2012   Status post open reduction internal fixation of a left ankle trimalleolar fracture by Dr. DOROTHA Lemond Stable on 01/10/2012.     History of benign thyroid  tumor    History of diabetes mellitus, type II    was diet controlled -- per pt pcp she was no longer diabetic   History of hiatal hernia    History of TIA (transient ischemic attack)    02-27-2009  no residual   HTN (hypertension)    Hyperlipidemia    Hypothyroidism, postsurgical    Lumbar radicular pain    R>L legs   Migraine headache    Mild intermittent asthma with allergic rhinitis without complication    PONV (postoperative nausea and vomiting)    Rotator cuff syndrome of right shoulder    Urge urinary incontinence    refractory   Wears glasses     PAST SURGICAL HISTORY: Past Surgical History:  Procedure Laterality Date   CARDIAC CATHETERIZATION  01-17-2003  dr wolm brodie   normal coronary arteries and LVF,  ef 60%   EXCISION MORTON'S NEUROMA  2002   bilateral feet   EXCISION OF BREAST BIOPSY Right 1990's   benign   INTERSTIM IMPLANT PLACEMENT N/A 02/10/2015   Procedure: RENNA IMPLANT FIRST STAGE;  Surgeon: Glendia Elizabeth, MD;  [facility];  Service: Urology;  Laterality: N/A;   INTERSTIM IMPLANT PLACEMENT N/A 02/10/2015   Procedure: RENNA IMPLANT SECOND STAGE;  Surgeon: Glendia Elizabeth, MD;  Location: [facility];  Service: Urology;  Laterality: N/A;   LAPAROSCOPIC CHOLECYSTECTOMY  2000   NEGATIVE SLEEP STUDY  2000   per pt   ORIF ANKLE FRACTURE  01/10/2012   Procedure: OPEN REDUCTION INTERNAL FIXATION (ORIF) ANKLE FRACTURE;  Surgeon: Lemond Stable, MD;  Location: [facility];  Service: Orthopedics;  Laterality: Left;   TOTAL  THYROIDECTOMY  1985   benign tumor   TRANSTHORACIC ECHOCARDIOGRAM   03-02-2009   normal echo,  ef 55-60%   VAGINAL HYSTERECTOMY  1984   WRIST SURGERY Left 2000    FAMILY HISTORY: Family History  Problem Relation Age of Onset   Cancer Other        breast -- grandmother   Cancer Other        lung -- uncles (3)   Leukemia Sister    Diabetes Father    Coronary artery disease Father    Diabetes Mother    Dementia Mother     SOCIAL HISTORY:  Social History   Socioeconomic History   Marital status: Married    Spouse name: Cybill Uriegas   Number of children: 2   Years of education: 12   Highest education level: Not on file  Occupational History   Occupation: Retired  Tobacco Use   Smoking status: Never   Smokeless tobacco: Never  Vaping Use   Vaping status: Not on file  Substance and Sexual Activity   Alcohol  use: No   Drug use: No   Sexual activity: Not on file  Other Topics Concern   Not on file  Social History Narrative   Lives with Keerthana Vanrossum (husband) and Maida Epps (grandson)   Caffeine use: 7 cups per day   Social Drivers of Health   Financial Resource Strain: Low Risk  (11/14/2023)   Received from [facility]   Overall Financial Resource Strain (CARDIA)    Difficulty of Paying Living Expenses: Not hard at all  Food Insecurity: No Food Insecurity (11/14/2023)   [facility]   Hunger Vital Sign    Within the past 12 months, you worried that your food would run out before you got the money to buy more.: Never true    Within the past 12 months, the food you bought just didn't last and you didn't have money to get more.: Never true  Transportation Needs: No Transportation Needs (11/14/2023)   Received from [facility] - Transportation    Lack of Transportation (Medical): No    Lack of Transportation (Non-Medical): No  Physical Activity: Insufficiently Active (11/14/2023)   Received from [facility]   Exercise Vital Sign    On average, how many days per week do you engage in moderate to strenuous  exercise (like a brisk walk)?: 1 day    On average, how many minutes do you engage in exercise at this level?: 10 min  Stress: No Stress Concern Present (11/14/2023)   Received from [facility] - Occupational Stress Questionnaire    Feeling of Stress : Only a little  Social Connections: Socially Integrated (11/14/2023)   Received from [facility]   Social Network    How would you rate your social network (family, work, friends)?: Good participation with social networks  Intimate Partner Violence: Not At Risk (11/14/2023)   Received from [facility]   HITS    Over the last 12 months how often did your partner physically hurt you?: Never    Over the last 12 months how often did your partner insult you or talk down to you?: Never    Over the last 12 months how often did your partner threaten you with physical harm?: Never    Over the last 12 months how often did your partner scream or curse at you?: Never     PHYSICAL EXAM  There were no vitals filed for this visit.    There is no height or weight on file to calculate BMI.   General: The patient is well-developed and well-nourished and in no acute distress  HEENT:  Head is [place]/AT.  Sclera are anicteric.   She is tender over the right > left lower cervical paraspinal muscles  Skin: Extremities are without rash or edema.   Neurologic Exam  Mental status: The patient is alert and oriented x 3 at the time of the examination. The patient has apparent normal recent and remote memory, with an apparently normal attention span and concentration ability.   Speech is normal.  Cranial nerves: Extraocular movements are full   Facial symmetry is present.  Facial strength is normal.  No dysarthria is noted.   . No obvious hearing deficits are noted.  Motor: There is a 6 Hz tremor in the hands and face that worsens with intention (writing, mimic utensil).   Muscle bulk is normal.   Tone is normal.  Strength is  5 / 5 in all 4 extremities.   Sensory: Sensory testing is intact to touch and vib  Coordination: Cerebellar testing reveals good finger-nose-finger and heel-to-shin bilaterally.  Gait and station: Station is normal.   Gait is just slightly wide. Tandem gait is moderately wide .  She is able to turn 180 degrees in 3 steps which is normal for age but moderate retropulsion with posture disturbed.  Romberg is negative.  Reflexes: Deep tendon reflexes are 3 at knees .  No ankle clonus .      DIAGNOSTIC DATA (LABS, IMAGING, TESTING) - I reviewed patient records, labs, notes, testing and imaging myself where available.  Lab Results  Component Value Date   WBC 5.3 08/24/2020   HGB 11.7 (L) 08/24/2020   HCT 37.2 08/24/2020   MCV 95.1 08/24/2020   PLT 250 08/24/2020      Component Value Date/Time   NA 141 08/24/2020 1758   NA 141 06/15/2015 0954   K 4.1 08/24/2020 1758   CL 104 08/24/2020 1758   CO2 25 08/24/2020 1758   GLUCOSE 95 08/24/2020 1758   BUN 9 08/24/2020 1758   BUN 15 06/15/2015 0954   CREATININE 0.75 08/24/2020 1758   CREATININE 0.67 09/18/2014 1145   CALCIUM 9.2 08/24/2020 1758   PROT 7.7 07/25/2018 1417   ALBUMIN 4.9 07/25/2018 1417   AST 20 07/25/2018 1417   ALT 21 07/25/2018 1417   ALKPHOS 98 07/25/2018 1417   BILITOT 0.7 07/25/2018 1417   GFRNONAA >60 08/24/2020 1758   GFRNONAA >89 09/18/2014 1145   GFRAA >60 07/25/2018 1417   GFRAA >89 09/18/2014 1145   Lab Results  Component Value Date   CHOL 170 09/18/2014   HDL 63 09/18/2014   LDLCALC 81 09/18/2014   TRIG 128 09/18/2014   CHOLHDL 2.7 09/18/2014   Lab Results  Component Value Date   HGBA1C 5.8 02/27/2015   Lab Results  Component Value Date   VITAMINB12 506 12/30/2009   Lab Results  Component Value Date   TSH 2.680 06/15/2015       ASSESSMENT AND PLAN  No diagnosis found.   1.   Tremor with mixed characteristics.   The DATScan  was negative and tremor more c/w BET than  PD (though she has retropulsion)    She has responded better to BET med's than PD med's.   2.    Continue  metoprolol  to XL 50 mg  Clonazepam  0.5 mg  po bid  3.   She has moderate spinal stenosis but does not have long track signs..  pain is axial.  Neck stiff at times 4.     Bilateral C5-C6 and C6-C7 paraspinal muscle injection with 80 mg Depo-Medrol  in 3 cc Marcaine  using sterile technique. She tolerated the injection well and pain was better afterwards.  rtc 6 month     I personally spent a total of *** minutes in the care of the patient today including {Time Based Coding:[id number]}.  Harlene Bogaert, AGNP-BC  [facility] [address] [place], [place] [address]  Phone [phone] Fax [phone] Note: This document was prepared with digital dictation and possible smart phrase technology. Any transcriptional errors that result from this process are unintentional.

## 2024-04-04 ENCOUNTER — Encounter: Payer: Self-pay | Admitting: Adult Health

## 2024-04-04 ENCOUNTER — Ambulatory Visit (INDEPENDENT_AMBULATORY_CARE_PROVIDER_SITE_OTHER): Admitting: Adult Health

## 2024-04-04 VITALS — BP 126/74 | HR 72 | Ht 65.0 in | Wt 152.4 lb

## 2024-04-04 DIAGNOSIS — R251 Tremor, unspecified: Secondary | ICD-10-CM

## 2024-04-04 DIAGNOSIS — M542 Cervicalgia: Secondary | ICD-10-CM

## 2024-04-04 MED ORDER — CLONAZEPAM 0.5 MG PO TABS: 0.5000 mg | ORAL_TABLET | Freq: Two times a day (BID) | ORAL | 3 refills | Status: AC

## 2024-04-04 MED ORDER — METOPROLOL SUCCINATE ER 50 MG PO TB24: 50.0000 mg | ORAL_TABLET | Freq: Every day | ORAL | 3 refills | Status: AC

## 2024-04-04 NOTE — Patient Instructions (Addendum)
 Your Plan:  Continue metoprolol  50mg  daily for tremor  Continue Klonopin  as needed  Refills provided       Follow up with Dr. Vear in 6 months or call earlier if needed     Thank you for coming to see us  at [facility]. I hope we have been able to provide you high quality care today.  You may receive a patient satisfaction survey over the next few weeks. We would appreciate your feedback and comments so that we may continue to improve ourselves and the health of our patients.

## 2024-04-29 ENCOUNTER — Ambulatory Visit (INDEPENDENT_AMBULATORY_CARE_PROVIDER_SITE_OTHER): Admitting: Neurology

## 2024-04-29 ENCOUNTER — Encounter: Payer: Self-pay | Admitting: Neurology

## 2024-04-29 VITALS — BP 130/72 | HR 74

## 2024-04-29 DIAGNOSIS — M542 Cervicalgia: Secondary | ICD-10-CM | POA: Diagnosis not present

## 2024-04-29 DIAGNOSIS — M4802 Spinal stenosis, cervical region: Secondary | ICD-10-CM | POA: Diagnosis not present

## 2024-04-29 DIAGNOSIS — R269 Unspecified abnormalities of gait and mobility: Secondary | ICD-10-CM | POA: Diagnosis not present

## 2024-04-29 DIAGNOSIS — G44229 Chronic tension-type headache, not intractable: Secondary | ICD-10-CM

## 2024-04-29 DIAGNOSIS — G250 Essential tremor: Secondary | ICD-10-CM | POA: Diagnosis not present

## 2024-04-29 MED ORDER — BETAMETHASONE SOD PHOS & ACET 6 (3-3) MG/ML IJ SUSP
12.0000 mg | Freq: Once | INTRAMUSCULAR | Status: AC
  Administered 2024-04-29: 12 mg via INTRAMUSCULAR

## 2024-04-29 NOTE — Progress Notes (Signed)
 [facility]  PATIENT: [name] [name] DOB: [birth year]  REFERRING DOCTOR OR PCP: Verneita Piety, FNP SOURCE: Patient, notes from primary care, notes in epic, information about InterStim device, MRI of the cervical spine from 2013  _________________________________   HISTORICAL  CHIEF COMPLAINT:  Chief Complaint  Patient presents with   Follow-up    Pt in room 11. Husband in room. Here for trigger point injection.    HISTORY OF PRESENT ILLNESS:  [name] [name] is a 78 y.o. woman with tremor/shakes and worsening gait.  Update 04/29/2024 She is having more pain in her neck/occipit causing a headache and some in lower neck sometimes to shoulder.   Last year her occipital HA improved with the splenius capitus trigger point injections .  However, having more neck pain and stiffness and inquires about another shot.    She continues to have tremors in arms and legs.   She had a DATScan  that showed Normal Ioflupane scan. No reduced radiotracer activity in basal ganglia to suggest Parkinson's syndrome pathology.     After the DATscan , we started metoprolol  50 mg daily and she continued clonazepam   for the tremor, with benefit.     She had been on primidone  but stopped - unclear it was helping,     Sinemet  had been tried with no benefit when etiology was less clear.   She is on Abilify but no change in symptoms were noted when she started.   She has never been on a neuroleptic to her knowledge.     Gait is mildly reduced.  She fell towards her left a fe months ago.  She has hit the wall a few times..   She has retropulsion   Mild difficulty rising from a chair.   Tremor is worse with intention and emotion (being upset).     Anxiety is also better on thee clonazepam  .   She has had more stress the last couple weeks,.  She notes some word finding difficuly    Tremor/gait history from early 10/22/2019 She noted tremors in her right > left hand and more difficulty with gait  around age 34.    She notes fine tasks like painting her nails, keyboarding holding a utensil bring the tremor out more.    She notes shaking in her head and neck and sometimes in her body when she wakes up.    She notes upper body jerks when she is drowsy.   She also notes facial movements when she is tired.    She notes reduced gait.   She feels balance is more of a problem than strength.  She hits the walls at times as she walks and notes falling more to the right.    She ha no recent falls.   Steps are normal sized.   She feels her turning is more off balanced..    A couple years ago, she reports a fall in [place] hurting her left side.   She fell without any reason -- no slip or trip.       Data: MRI of the cervical spine from 08/23/2011 showed:SABRA  There are degenerative changes, worse at C6-C7 but no spinal cord changes or significant spinal stenosis  CT cervical spine 7/15/02021 showed Cervical disc and facet degeneration with moderate spinal stenosis at C5-6 and mild spinal stenosis at C3-4 and C6-7. and multilevel foraminal narrowing (moderate to the right at C3C4, C4C5, moderate to left at C6C7.     CT head  01/23/2020 showed mild chronic microvascular ischemic changes.    Nuclear medicine DaTscan  05/18/2021 was normal   REVIEW OF SYSTEMS: Constitutional: She reports fevers, chills, weight gain, fatigue, insomnia Eyes: She reports eye pain. Ear, nose and throat: She reports hearing loss with tinnitus on the left. Cardiovascular: No chest pain, palpitations Respiratory: She reports shortness of breath, cough and snoring GastrointestinaI: No nausea, vomiting, diarrhea, abdominal pain, fecal incontinence Genitourinary: She has incontinence and has an InterStim device.  Musculoskeletal: She reports neck pain, back pain, myalgias and joint pain. Integumentary: She reports pruritus and mild.   Neurological: as above Psychiatric: She sees psychiatry for bipolar disorder. Endocrine: She reports  feeling hot or cold at times and increased thirst. Hematologic/Lymphatic:  No anemia, purpura, petechiae. Allergic/Immunologic: No itchy/runny eyes, nasal congestion, recent allergic reactions, rashes  ALLERGIES: Allergies  Allergen Reactions   Diclofenac  Sodium Anaphylaxis and Swelling    almost died caused neck to swell.    Diclofenac  Sodium Anaphylaxis and Swelling    almost died caused neck to swell.    Nsaids Anaphylaxis   Penicillins Swelling and Rash    Did it involve swelling of the face/tongue/throat, SOB, or low BP? Y Did it involve sudden or severe rash/hives, skin peeling, or any reaction on the inside of your mouth or nose? Y Did you need to seek medical attention at a hospital or doctor's office? n When did it last happen?   more than 10 years ago If all above answers are "NO", may proceed with cephalosporin use.    Relafen [Nabumetone] Anaphylaxis and Swelling   Other Other (See Comments) and Swelling    Honey causes throat to swell. Strawberries cause a rash.  turnes red, swells, and pulls skin off turnes red, swells, and pulls skin off   Adhesive [Tape] Swelling    turnes red, swells, and pulls skin off   Codeine Nausea And Vomiting    severe   Ex-Lax [Senna] Other (See Comments)    Pt states my fingers turned black   Trazodone And Nefazodone Other (See Comments)    Possible akathisia   Neosporin [Neomycin-Bacitracin Zn-Polymyx] Rash    HOME MEDICATIONS:  Current Outpatient Medications:    albuterol  (PROVENTIL  HFA;VENTOLIN  HFA) 108 (90 Base) MCG/ACT inhaler, Inhale 2 puffs into the lungs every 4 (four) hours as needed for wheezing or shortness of breath. , Disp: , Rfl:    ARIPiprazole (ABILIFY) 15 MG tablet, Take 15 mg by mouth daily., Disp: , Rfl:    aspirin  325 MG tablet, Take 325 mg by mouth daily. Takes 1/4 tablet daily, Disp: , Rfl:    CALCIUM PO, Take 600 mg by mouth daily., Disp: , Rfl:    clonazePAM  (KLONOPIN ) 0.5 MG tablet, Take 1 tablet  (0.5 mg total) by mouth 2 (two) times daily., Disp: 60 tablet, Rfl: 3   estradiol (ESTRACE) 0.1 MG/GM vaginal cream, Place 1 Applicatorful vaginally daily., Disp: , Rfl:    famotidine (PEPCID) 20 MG tablet, Take 20 mg by mouth daily., Disp: , Rfl:    GEMTESA 75 MG TABS, Take 1 tablet by mouth daily., Disp: , Rfl:    lamoTRIgine (LAMICTAL) 150 MG tablet, Take 1 tablet by mouth 2 (two) times daily. , Disp: , Rfl:    levothyroxine  (SYNTHROID , LEVOTHROID) 100 MCG tablet, TAKE 1 TABLET(100 MCG) BY MOUTH DAILY BEFORE BREAKFAST (Patient taking differently: Take 100 mcg by mouth daily before breakfast.), Disp: 30 tablet, Rfl: 5   losartan  (COZAAR ) 100 MG tablet, Take 100 mg by  mouth daily. , Disp: , Rfl:    metoprolol  succinate (TOPROL -XL) 50 MG 24 hr tablet, Take 1 tablet (50 mg total) by mouth daily., Disp: 90 tablet, Rfl: 3   omeprazole  (PRILOSEC) 40 MG capsule, Take 1 capsule (40 mg total) by mouth 2 (two) times daily., Disp: 180 capsule, Rfl: 0   oxyCODONE -acetaminophen  (PERCOCET/ROXICET) 5-325 MG tablet, Take 1 tablet by mouth in the morning and at bedtime., Disp: , Rfl:    OZEMPIC, 1 MG/DOSE, 4 MG/3ML SOPN, Inject 3 mLs into the skin once a week., Disp: , Rfl:    polyethylene glycol (MIRALAX  / GLYCOLAX ) packet, Take 17 g by mouth daily as needed for mild constipation., Disp: , Rfl:    pravastatin  (PRAVACHOL ) 40 MG tablet, Take 40 mg by mouth at bedtime., Disp: , Rfl:    pregabalin  (LYRICA ) 150 MG capsule, Take 150 mg by mouth in the morning, at noon, and at bedtime., Disp: , Rfl:    PREMARIN vaginal cream, Place 1 applicator vaginally daily., Disp: , Rfl:    QUEtiapine Fumarate (SEROQUEL XR) 150 MG 24 hr tablet, Take 150 mg by mouth at bedtime., Disp: , Rfl:    traZODone (DESYREL) 100 MG tablet, Take 100 mg by mouth at bedtime., Disp: , Rfl:    venlafaxine  (EFFEXOR ) 100 MG tablet, TAKE ONE TABLET BY MOUTH DAILY THREE TIMES A DAY, Disp: 90 tablet, Rfl: 0  Current Facility-Administered Medications:     bupivacaine  (MARCAINE ) 0.5 % (with pres) injection 3 mL, 3 mL, Infiltration, Once, Jermya Dowding, [name] LABOR, MD  PAST MEDICAL HISTORY: Past Medical History:  Diagnosis Date   Anxiety    Cervical radiculopathy    Chronic back pain    R > L   Chronic neck pain    Depression    Dysphagia    functional --  takes small bites   Fibromyalgia    GERD (gastroesophageal reflux disease)    Hemorrhoids    History of ankle fracture 01/08/2012   Status post open reduction internal fixation of a left ankle trimalleolar fracture by Dr. DOROTHA Lemond Stable on 01/10/2012.     History of benign thyroid  tumor    History of diabetes mellitus, type II    was diet controlled -- per pt pcp she was no longer diabetic   History of hiatal hernia    History of TIA (transient ischemic attack)    02-27-2009  no residual   HTN (hypertension)    Hyperlipidemia    Hypothyroidism, postsurgical    Lumbar radicular pain    R>L legs   Migraine headache    Mild intermittent asthma with allergic rhinitis without complication    PONV (postoperative nausea and vomiting)    Rotator cuff syndrome of right shoulder    Urge urinary incontinence    refractory   Wears glasses     PAST SURGICAL HISTORY: Past Surgical History:  Procedure Laterality Date   CARDIAC CATHETERIZATION  01-17-2003  dr wolm brodie   normal coronary arteries and LVF,  ef 60%   EXCISION MORTON'S NEUROMA  2002   bilateral feet   EXCISION OF BREAST BIOPSY Right 1990's   benign   INTERSTIM IMPLANT PLACEMENT N/A 02/10/2015   Procedure: RENNA IMPLANT FIRST STAGE;  Surgeon: Glendia Elizabeth, MD;  Location[facility];  Service: Urology;  Laterality: N/A;   INTERSTIM IMPLANT PLACEMENT N/A 02/10/2015   Procedure: RENNA IMPLANT SECOND STAGE;  Surgeon: Glendia Elizabeth, MD;  Location[facility];  Service: Urology;  Laterality: N/A;   LAPAROSCOPIC CHOLECYSTECTOMY  2000   NEGATIVE SLEEP STUDY  2000   per pt   ORIF ANKLE  FRACTURE  01/10/2012   Procedure: OPEN REDUCTION INTERNAL FIXATION (ORIF) ANKLE FRACTURE;  Surgeon: Lemond Stable, MD;  Location: [facility];  Service: Orthopedics;  Laterality: Left;   TOTAL THYROIDECTOMY  1985   benign tumor   TRANSTHORACIC ECHOCARDIOGRAM  03-02-2009   normal echo,  ef 55-60%   VAGINAL HYSTERECTOMY  1984   WRIST SURGERY Left 2000    FAMILY HISTORY: Family History  Problem Relation Age of Onset   Cancer Other        breast -- grandmother   Cancer Other        lung -- uncles (3)   Leukemia Sister    Diabetes Father    Coronary artery disease Father    Diabetes Mother    Dementia Mother     SOCIAL HISTORY:  Social History   Socioeconomic History   Marital status: Married    Spouse name: [name] [name]   Number of children: 2   Years of education: 12   Highest education level: Not on file  Occupational History   Occupation: Retired  Tobacco Use   Smoking status: Never   Smokeless tobacco: Never  Vaping Use   Vaping status: Not on file  Substance and Sexual Activity   Alcohol  use: No   Drug use: No   Sexual activity: Not on file  Other Topics Concern   Not on file  Social History Narrative   Lives with [name] [name] (husband) and [name] [name] (grandson)   Caffeine use: 7 cups per day   Social Drivers of Health   Financial Resource Strain: Low Risk  (02/17/2024)   Received from [facility]   Overall Financial Resource Strain (CARDIA)    How hard is it for you to pay for the very basics like food, housing, medical care, and heating?: Not hard at all  Food Insecurity: No Food Insecurity (02/17/2024)   Received from [facility]   Hunger Vital Sign    Within the past 12 months, you worried that your food would run out before you got the money to buy more.: Never true    Within the past 12 months, the food you bought just didn't last and you didn't have money to get more.: Never true  Transportation Needs: No Transportation Needs (02/17/2024)   Received from  [facility] - Transportation    In the past 12 months, has lack of transportation kept you from medical appointments or from getting medications?: No    In the past 12 months, has lack of transportation kept you from meetings, work, or from getting things needed for daily living?: No  Physical Activity: Insufficiently Active (02/17/2024)   Received from [facility]   Exercise Vital Sign    On average, how many days per week do you engage in moderate to strenuous exercise (like a brisk walk)?: 2 days    On average, how many minutes do you engage in exercise at this level?: 10 min  Stress: No Stress Concern Present (02/17/2024)   Received from [facility] - Occupational Stress Questionnaire    Do you feel stress - tense, restless, nervous, or anxious, or unable to sleep at night because your mind is troubled all the time - these days?: Only a little  Social Connections: Socially Integrated (02/17/2024)   Received from  [facility]   Social Network    How would you rate your social network (family, work, friends)?: Good participation with social networks  Intimate Partner Violence: Not At Risk (02/17/2024)   Received from [facility]   HITS    Over the last 12 months how often did your partner physically hurt you?: Never    Over the last 12 months how often did your partner insult you or talk down to you?: Never    Over the last 12 months how often did your partner threaten you with physical harm?: Never    Over the last 12 months how often did your partner scream or curse at you?: Never     PHYSICAL EXAM  Vitals:   04/29/24 1311  BP: 130/72  Pulse: 74  SpO2: 96%     There is no height or weight on file to calculate BMI.   General: The patient is well-developed and well-nourished and in no acute distress  HEENT:  Head is [place]/AT.  Sclera are anicteric.   She is tender over the right > left lower cervical paraspinal  muscles  Skin: Extremities are without rash or edema.   Neurologic Exam  Mental status: The patient is alert and oriented x 3 at the time of the examination. The patient has apparent normal recent and remote memory, with an apparently normal attention span and concentration ability.   Speech is normal.  Cranial nerves: Extraocular movements are full   Facial symmetry is present.  Facial strength is normal.  No dysarthria is noted.   . No obvious hearing deficits are noted.  Motor: There is a 6 Hz tremor in the hands and face that worsens with intention (writing, mimic utensil).   Muscle bulk is normal.   Tone is normal. Strength is  5 / 5 in all 4 extremities.   Sensory: Sensory testing is intact to touch and vib  Coordination: Cerebellar testing reveals good finger-nose-finger and heel-to-shin bilaterally.  Gait and station: Station is normal.   Gait is just slightly wide. Tandem gait is moderately wide .  She is able to turn 180 degrees in 3 steps which is normal for age but moderate retropulsion with posture disturbed.  Romberg is negative.  Reflexes: Deep tendon reflexes are 3 at knees .  No ankle clonus .      DIAGNOSTIC DATA (LABS, IMAGING, TESTING) - I reviewed patient records, labs, notes, testing and imaging myself where available.  Lab Results  Component Value Date   WBC 5.3 08/24/2020   HGB 11.7 (L) 08/24/2020   HCT 37.2 08/24/2020   MCV 95.1 08/24/2020   PLT 250 08/24/2020      Component Value Date/Time   NA 141 08/24/2020 1758   NA 141 06/15/2015 0954   K 4.1 08/24/2020 1758   CL 104 08/24/2020 1758   CO2 25 08/24/2020 1758   GLUCOSE 95 08/24/2020 1758   BUN 9 08/24/2020 1758   BUN 15 06/15/2015 0954   CREATININE 0.75 08/24/2020 1758   CREATININE 0.67 09/18/2014 1145   CALCIUM 9.2 08/24/2020 1758   PROT 7.7 07/25/2018 1417   ALBUMIN 4.9 07/25/2018 1417   AST 20 07/25/2018 1417   ALT 21 07/25/2018 1417   ALKPHOS 98 07/25/2018 1417   BILITOT 0.7  07/25/2018 1417   GFRNONAA >60 08/24/2020 1758   GFRNONAA >89 09/18/2014 1145   GFRAA >60 07/25/2018 1417   GFRAA >89 09/18/2014 1145   Lab Results  Component Value Date  CHOL 170 09/18/2014   HDL 63 09/18/2014   LDLCALC 81 09/18/2014   TRIG 128 09/18/2014   CHOLHDL 2.7 09/18/2014   Lab Results  Component Value Date   HGBA1C 5.8 02/27/2015   Lab Results  Component Value Date   VITAMINB12 506 12/30/2009   Lab Results  Component Value Date   TSH 2.680 06/15/2015       ASSESSMENT AND PLAN  Essential tremor  Gait disturbance  Neck pain  Cervical spinal stenosis  Chronic tension-type headache, not intractable   1.   She has a tremor with mixed characteristics.   Likely BET.  The DATScan  was negative      She has responded better to BET med's than PD med's.   2.    Continue  metoprolol  to XL 50 mg  Clonazepam  0.5 mg po bid  3.   She has moderate spinal stenosis but does not have long track signs- likely not affecting gait.  pain is axial.  Neck stiff at times 4.     Bilateral splenius capitus and C6-C7 paraspinal muscle injection with 12 mg Celestone in [place]  using sterile technique. She tolerated the injection well and pain was better afterwards.  rtc 6 month, sooner if new or worsening issues   [name] [name] A. Vear, MD, [place] RENO 04/29/2024, 2:05 PM Certified in Neurology, Clinical Neurophysiology, Sleep Medicine and Neuroimaging  [facility] [address] [place], [place] [address] [phone]

## 2024-10-31 ENCOUNTER — Ambulatory Visit: Admitting: Neurology
